# Patient Record
Sex: Male | Born: 1960
Health system: Southern US, Community
[De-identification: ages and names within clinical notes are randomized; demographics above are authoritative.]

## PROBLEM LIST (undated history)

## (undated) DIAGNOSIS — G5730 Lesion of lateral popliteal nerve, unspecified lower limb: Secondary | ICD-10-CM

## (undated) DIAGNOSIS — I5022 Chronic systolic (congestive) heart failure: Secondary | ICD-10-CM

## (undated) DIAGNOSIS — I429 Cardiomyopathy, unspecified: Secondary | ICD-10-CM

## (undated) DIAGNOSIS — I509 Heart failure, unspecified: Secondary | ICD-10-CM

## (undated) DIAGNOSIS — M86669 Other chronic osteomyelitis, unspecified tibia and fibula: Secondary | ICD-10-CM

## (undated) DIAGNOSIS — J45909 Unspecified asthma, uncomplicated: Secondary | ICD-10-CM

## (undated) DIAGNOSIS — IMO0002 Reserved for concepts with insufficient information to code with codable children: Secondary | ICD-10-CM

## (undated) DIAGNOSIS — M19029 Primary osteoarthritis, unspecified elbow: Secondary | ICD-10-CM

## (undated) DIAGNOSIS — M1710 Unilateral primary osteoarthritis, unspecified knee: Secondary | ICD-10-CM

## (undated) DIAGNOSIS — I10 Essential (primary) hypertension: Secondary | ICD-10-CM

## (undated) DIAGNOSIS — S82109A Unspecified fracture of upper end of unspecified tibia, initial encounter for closed fracture: Secondary | ICD-10-CM

## (undated) HISTORY — DX: Reserved for concepts with insufficient information to code with codable children: IMO0002

## (undated) HISTORY — DX: Cardiomyopathy, unspecified: I42.9

## (undated) HISTORY — DX: Chronic systolic (congestive) heart failure: I50.22

## (undated) HISTORY — DX: Lesion of lateral popliteal nerve, unspecified lower limb: G57.30

## (undated) HISTORY — DX: Primary osteoarthritis, unspecified elbow: M19.029

## (undated) HISTORY — PX: LEG SURGERY: SHX1003

## (undated) HISTORY — DX: Unilateral primary osteoarthritis, unspecified knee: M17.10

## (undated) HISTORY — DX: Other chronic osteomyelitis, unspecified tibia and fibula: M86.669

## (undated) HISTORY — DX: Unspecified fracture of upper end of unspecified tibia, initial encounter for closed fracture: S82.109A

## (undated) HISTORY — PX: CARDIAC CATHETERIZATION: SHX172

## (undated) HISTORY — DX: Essential (primary) hypertension: I10

---

## 2002-05-07 HISTORY — PX: OTHER SURGICAL HISTORY: SHX169

## 2005-05-07 DIAGNOSIS — S82109A Unspecified fracture of upper end of unspecified tibia, initial encounter for closed fracture: Secondary | ICD-10-CM

## 2005-05-07 HISTORY — DX: Unspecified fracture of upper end of unspecified tibia, initial encounter for closed fracture: S82.109A

## 2007-05-08 HISTORY — PX: RETINAL DETACHMENT SURGERY: SHX105

## 2010-05-04 ENCOUNTER — Emergency Department (HOSPITAL_COMMUNITY)
Admission: EM | Admit: 2010-05-04 | Discharge: 2010-05-05 | Payer: Self-pay | Source: Home / Self Care | Admitting: Emergency Medicine

## 2010-06-27 ENCOUNTER — Ambulatory Visit: Payer: Self-pay | Admitting: Physical Medicine & Rehabilitation

## 2010-07-11 ENCOUNTER — Ambulatory Visit: Payer: BC Managed Care – PPO | Admitting: Physical Medicine & Rehabilitation

## 2010-08-23 ENCOUNTER — Other Ambulatory Visit: Payer: Self-pay | Admitting: Physical Medicine & Rehabilitation

## 2010-08-23 ENCOUNTER — Encounter
Payer: BC Managed Care – PPO | Attending: Physical Medicine & Rehabilitation | Admitting: Physical Medicine & Rehabilitation

## 2010-08-23 ENCOUNTER — Ambulatory Visit (HOSPITAL_COMMUNITY)
Admission: RE | Admit: 2010-08-23 | Discharge: 2010-08-23 | Disposition: A | Discharge status: Home / Self Care | Payer: BC Managed Care – PPO | Source: Ambulatory Visit | Attending: Physical Medicine & Rehabilitation | Admitting: Physical Medicine & Rehabilitation

## 2010-08-23 DIAGNOSIS — M25469 Effusion, unspecified knee: Secondary | ICD-10-CM | POA: Insufficient documentation

## 2010-08-23 DIAGNOSIS — R52 Pain, unspecified: Secondary | ICD-10-CM

## 2010-08-23 DIAGNOSIS — M79609 Pain in unspecified limb: Secondary | ICD-10-CM | POA: Insufficient documentation

## 2010-08-23 DIAGNOSIS — Z7982 Long term (current) use of aspirin: Secondary | ICD-10-CM | POA: Insufficient documentation

## 2010-08-23 DIAGNOSIS — M25569 Pain in unspecified knee: Secondary | ICD-10-CM | POA: Insufficient documentation

## 2010-08-23 DIAGNOSIS — E669 Obesity, unspecified: Secondary | ICD-10-CM | POA: Insufficient documentation

## 2010-08-23 DIAGNOSIS — I10 Essential (primary) hypertension: Secondary | ICD-10-CM | POA: Insufficient documentation

## 2010-08-23 DIAGNOSIS — M1710 Unilateral primary osteoarthritis, unspecified knee: Secondary | ICD-10-CM

## 2010-08-23 DIAGNOSIS — R209 Unspecified disturbances of skin sensation: Secondary | ICD-10-CM | POA: Insufficient documentation

## 2010-08-23 DIAGNOSIS — Z79899 Other long term (current) drug therapy: Secondary | ICD-10-CM | POA: Insufficient documentation

## 2010-08-23 DIAGNOSIS — M7989 Other specified soft tissue disorders: Secondary | ICD-10-CM | POA: Insufficient documentation

## 2010-08-23 DIAGNOSIS — S82109A Unspecified fracture of upper end of unspecified tibia, initial encounter for closed fracture: Secondary | ICD-10-CM

## 2010-08-23 DIAGNOSIS — Z8781 Personal history of (healed) traumatic fracture: Secondary | ICD-10-CM | POA: Insufficient documentation

## 2010-08-23 DIAGNOSIS — G5730 Lesion of lateral popliteal nerve, unspecified lower limb: Secondary | ICD-10-CM

## 2010-08-23 DIAGNOSIS — F411 Generalized anxiety disorder: Secondary | ICD-10-CM | POA: Insufficient documentation

## 2010-08-23 DIAGNOSIS — M19029 Primary osteoarthritis, unspecified elbow: Secondary | ICD-10-CM

## 2010-08-23 DIAGNOSIS — J45909 Unspecified asthma, uncomplicated: Secondary | ICD-10-CM | POA: Insufficient documentation

## 2010-08-23 DIAGNOSIS — M25579 Pain in unspecified ankle and joints of unspecified foot: Secondary | ICD-10-CM | POA: Insufficient documentation

## 2010-08-23 DIAGNOSIS — M21869 Other specified acquired deformities of unspecified lower leg: Secondary | ICD-10-CM | POA: Insufficient documentation

## 2010-09-20 ENCOUNTER — Ambulatory Visit: Payer: BC Managed Care – PPO | Admitting: Physical Medicine & Rehabilitation

## 2010-09-22 ENCOUNTER — Encounter
Payer: BC Managed Care – PPO | Attending: Physical Medicine & Rehabilitation | Admitting: Physical Medicine & Rehabilitation

## 2010-09-22 DIAGNOSIS — M79609 Pain in unspecified limb: Secondary | ICD-10-CM | POA: Insufficient documentation

## 2010-09-22 DIAGNOSIS — N529 Male erectile dysfunction, unspecified: Secondary | ICD-10-CM | POA: Insufficient documentation

## 2010-09-22 DIAGNOSIS — M19029 Primary osteoarthritis, unspecified elbow: Secondary | ICD-10-CM

## 2010-09-22 DIAGNOSIS — I10 Essential (primary) hypertension: Secondary | ICD-10-CM | POA: Insufficient documentation

## 2010-09-22 DIAGNOSIS — E669 Obesity, unspecified: Secondary | ICD-10-CM | POA: Insufficient documentation

## 2010-09-22 DIAGNOSIS — S82109A Unspecified fracture of upper end of unspecified tibia, initial encounter for closed fracture: Secondary | ICD-10-CM

## 2010-09-22 DIAGNOSIS — F411 Generalized anxiety disorder: Secondary | ICD-10-CM | POA: Insufficient documentation

## 2010-09-22 DIAGNOSIS — M1710 Unilateral primary osteoarthritis, unspecified knee: Secondary | ICD-10-CM

## 2010-09-22 DIAGNOSIS — G5730 Lesion of lateral popliteal nerve, unspecified lower limb: Secondary | ICD-10-CM
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
## 2010-11-17 ENCOUNTER — Ambulatory Visit: Payer: Self-pay | Admitting: Physical Medicine & Rehabilitation

## 2010-11-24 ENCOUNTER — Encounter: Payer: Medicare Other | Attending: Physical Medicine & Rehabilitation | Admitting: Physical Medicine & Rehabilitation

## 2010-11-24 DIAGNOSIS — M79609 Pain in unspecified limb: Secondary | ICD-10-CM | POA: Insufficient documentation

## 2010-11-24 DIAGNOSIS — N529 Male erectile dysfunction, unspecified: Secondary | ICD-10-CM | POA: Insufficient documentation

## 2010-11-24 DIAGNOSIS — R52 Pain, unspecified: Secondary | ICD-10-CM | POA: Insufficient documentation

## 2010-11-24 DIAGNOSIS — S82109A Unspecified fracture of upper end of unspecified tibia, initial encounter for closed fracture: Secondary | ICD-10-CM

## 2010-11-24 DIAGNOSIS — E669 Obesity, unspecified: Secondary | ICD-10-CM | POA: Insufficient documentation

## 2010-11-24 DIAGNOSIS — M19029 Primary osteoarthritis, unspecified elbow: Secondary | ICD-10-CM

## 2010-11-24 DIAGNOSIS — G5730 Lesion of lateral popliteal nerve, unspecified lower limb: Secondary | ICD-10-CM

## 2010-11-24 DIAGNOSIS — M1710 Unilateral primary osteoarthritis, unspecified knee: Secondary | ICD-10-CM

## 2010-11-24 NOTE — Assessment & Plan Note (Signed)
Victor [name] is back regarding his left leg pain.  We started a month of hydrocodone.  Last visit is helping some with his breakthrough pain.  He is wearing the left ankle supramalleolar orthosis that is helping his ankle stability.  He was taking the hydrocodone initially scheduled and ran out early and it was a realizing again that I wanted him to take it as needed.  He has been taking it 2-3 a day now and it seems to be working fairly well for him.  The scheduled dosing was too much from a side effect profile for him regardless.  Pain is about 6-7/10; described as dull, aching, tingling and constant.  Pain interferes with general activities, relations with others and enjoyment of life on a moderate level.  REVIEW OF SYSTEMS:  Notable for numbness, tingling and anxiety.  Full 12- point review is in the written health and history section of the chart.  SOCIAL HISTORY:  The patient is married.  Recently was at the beach.  He is trying to walk about a mile each day for the most part.  PHYSICAL EXAMINATION:  VITAL SIGNS:  Blood pressure is 125/75, pulse 91, respiratory rate 18 and he is satting 96% on room air. GENERAL:  The patient is pleasant and alert. EXTREMITIES:  He has antalgia still on the left leg and walks well slightly wide-based gait.  He has his ankle brace on as well as the leg sleeve.  He has some crepitus in the knee once again.  Weight is unchanged. NEUROLOGIC:  Cognitively, he is alert and appropriate. HEART:  Regular. CHEST:  Clear. ABDOMEN:  Soft, nontender.  ASSESSMENT: 1. History of left lower extremity trauma involving the left tibia and     distal femur.  The patient with multifactorial pain syndrome. 2. Erectile dysfunction. 3. Obesity.  PLAN: 1. Discussed other means of exercise which are less impact including     water walking, swimming, shredding water, stationary bike, etc. 2. The patient has an appointment to see orthopedic surgery for     assessment of  his knee and ankle. 3. We will increase his Topamax to 100 mg nightly to see if this helps     further with dysesthetic pain and perhaps his weight is well.  If     this is unsuccessful after a month, we will stop. 4. I refilled his hydrocodone 5/325 one q.6 h. p.r.n. #90. 5. Continue meloxicam. 6. Refilled Viagra for this patient today as well. 7. I will see him back in about 3 months.     Victor [name], M.D. Electronically Signed    ZTS/MedQ D:  11/24/2010 11:40:35  T:  11/24/2010 12:24:13  Job #:  [id number]

## 2011-02-20 ENCOUNTER — Ambulatory Visit: Payer: Medicare Other | Admitting: Physical Medicine & Rehabilitation

## 2011-03-23 ENCOUNTER — Encounter: Payer: Medicare Other | Admitting: Physical Medicine & Rehabilitation

## 2011-04-18 ENCOUNTER — Encounter: Payer: Medicare Other | Attending: Physical Medicine & Rehabilitation | Admitting: Neurosurgery

## 2011-04-18 DIAGNOSIS — E669 Obesity, unspecified: Secondary | ICD-10-CM | POA: Insufficient documentation

## 2011-04-18 DIAGNOSIS — X58XXXS Exposure to other specified factors, sequela: Secondary | ICD-10-CM | POA: Insufficient documentation

## 2011-04-18 DIAGNOSIS — S8290XS Unspecified fracture of unspecified lower leg, sequela: Secondary | ICD-10-CM | POA: Insufficient documentation

## 2011-04-18 DIAGNOSIS — G8921 Chronic pain due to trauma: Secondary | ICD-10-CM

## 2011-04-18 DIAGNOSIS — N529 Male erectile dysfunction, unspecified: Secondary | ICD-10-CM | POA: Insufficient documentation

## 2011-04-18 DIAGNOSIS — M79609 Pain in unspecified limb: Secondary | ICD-10-CM | POA: Insufficient documentation

## 2011-04-18 DIAGNOSIS — G8928 Other chronic postprocedural pain: Secondary | ICD-10-CM
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
## 2011-04-23 ENCOUNTER — Ambulatory Visit: Payer: Medicare Other | Admitting: Physical Medicine & Rehabilitation

## 2011-04-24 ENCOUNTER — Ambulatory Visit: Payer: Medicare Other | Admitting: Physical Medicine & Rehabilitation

## 2011-05-10 DIAGNOSIS — Z79899 Other long term (current) drug therapy: Secondary | ICD-10-CM | POA: Diagnosis not present

## 2011-05-22 ENCOUNTER — Encounter
Payer: BC Managed Care – PPO | Attending: Physical Medicine & Rehabilitation | Admitting: Physical Medicine & Rehabilitation

## 2011-05-22 DIAGNOSIS — M12569 Traumatic arthropathy, unspecified knee: Secondary | ICD-10-CM | POA: Insufficient documentation

## 2011-05-22 DIAGNOSIS — M79609 Pain in unspecified limb: Secondary | ICD-10-CM | POA: Insufficient documentation

## 2011-05-22 DIAGNOSIS — S82109A Unspecified fracture of upper end of unspecified tibia, initial encounter for closed fracture: Secondary | ICD-10-CM | POA: Diagnosis not present

## 2011-05-22 DIAGNOSIS — E669 Obesity, unspecified: Secondary | ICD-10-CM | POA: Insufficient documentation

## 2011-05-22 DIAGNOSIS — G5730 Lesion of lateral popliteal nerve, unspecified lower limb: Secondary | ICD-10-CM | POA: Diagnosis not present

## 2011-05-22 DIAGNOSIS — M1710 Unilateral primary osteoarthritis, unspecified knee: Secondary | ICD-10-CM | POA: Diagnosis not present

## 2011-05-22 NOTE — Assessment & Plan Note (Signed)
Victor [name] is back regarding his left leg pain.  Apparently, he saw Dr. Modesto [name] at [facility] [place] [facility] who started him on Lyrica and meloxicam.  Still having 8/10 pain which is actually worse from last visit in December.  He attributes this to the cold weather.  He also has been trying to stay a bit active.  However, this is difficult given his pain levels.  REVIEW OF SYSTEMS:  Notable for the above.  He does report some depression.  Full 12-point review is in the written health and history section of the chart.  SOCIAL HISTORY:  Unchanged.  The patient is married.  PHYSICAL EXAMINATION:  VITAL SIGNS:  Blood pressure is 128/99, pulse 92, respiratory rate is 16 and he is satting 97% on room air. GENERAL:  The patient is pleasant and alert. EXTREMITIES:  He walks with antalgia on the left side.  He seems to step down into the left leg as well.  I measured the left leg first to the right.  It measured about a centimeter shorter than the right.  He has varus deformity essentially of the left tibia/leg.  Left knee has mild infrapatellar edema.  There is some crepitus with extension and flexion. Strength is grossly 5/5 at the knee.  Left ankle is limited due to pain and his postsurgical changes.  He does have some diminished sensation grossly over the foot and ankle.  ASSESSMENT: 1. History of left lower extremity trauma involving the left tibial     plateau shaft and distal femur.  He has multifactorial pain as a     result.  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 Obesity.  PLAN: 1. After informed consent, we injected the left knee via lateral     approach with 40 mg Kenalog and 3 mL of 1% lidocaine.  The patient     tolerated it well.  We will observe for results.  I would like him     to pursue non impact exercise for strengthening the leg as well as     for aerobic exercise.  He needs to stay away from running and     prolonged ambulation. 2. He can continue  with Lyrica and meloxicam per Dr. Modesto [name].  We had     used Topamax previously because of the weight loss side effect.  I     discussed the possibility of weight gain with Lyrica. 3. I changed his Norco to oxycodone 10 mg 1 q.8 h. p.r.n. #60. 4. Discussed other options for this patient including Synvisc     injections.  I will see him back next month.     Victor [name], M.D. Electronically Signed    ZTS/MedQ D:  05/22/2011 10:30:10  T:  05/22/2011 18:30:47  Job #:  [id number]

## 2011-06-04 DIAGNOSIS — G894 Chronic pain syndrome: Secondary | ICD-10-CM | POA: Diagnosis not present

## 2011-06-04 DIAGNOSIS — M25569 Pain in unspecified knee: Secondary | ICD-10-CM | POA: Diagnosis not present

## 2011-06-04 DIAGNOSIS — Z79899 Other long term (current) drug therapy: Secondary | ICD-10-CM | POA: Diagnosis not present

## 2011-06-04 DIAGNOSIS — G5790 Unspecified mononeuropathy of unspecified lower limb: Secondary | ICD-10-CM | POA: Diagnosis not present

## 2011-06-04 DIAGNOSIS — G609 Hereditary and idiopathic neuropathy, unspecified: Secondary | ICD-10-CM | POA: Diagnosis not present

## 2011-06-20 ENCOUNTER — Encounter: Payer: Medicare Other | Attending: Physical Medicine & Rehabilitation | Admitting: Physical Medicine & Rehabilitation

## 2011-06-20 DIAGNOSIS — M79609 Pain in unspecified limb: Secondary | ICD-10-CM | POA: Diagnosis not present

## 2011-06-20 DIAGNOSIS — S82109A Unspecified fracture of upper end of unspecified tibia, initial encounter for closed fracture: Secondary | ICD-10-CM | POA: Diagnosis not present

## 2011-06-20 DIAGNOSIS — S72409A Unspecified fracture of lower end of unspecified femur, initial encounter for closed fracture: Secondary | ICD-10-CM | POA: Insufficient documentation

## 2011-06-20 DIAGNOSIS — M19029 Primary osteoarthritis, unspecified elbow: Secondary | ICD-10-CM | POA: Diagnosis not present

## 2011-06-20 DIAGNOSIS — M1710 Unilateral primary osteoarthritis, unspecified knee: Secondary | ICD-10-CM | POA: Diagnosis not present

## 2011-06-20 DIAGNOSIS — G5730 Lesion of lateral popliteal nerve, unspecified lower limb: Secondary | ICD-10-CM

## 2011-06-20 DIAGNOSIS — X58XXXA Exposure to other specified factors, initial encounter: Secondary | ICD-10-CM | POA: Insufficient documentation

## 2011-06-20 DIAGNOSIS — S8410XA Injury of peroneal nerve at lower leg level, unspecified leg, initial encounter: Secondary | ICD-10-CM | POA: Insufficient documentation

## 2011-06-20 DIAGNOSIS — R209 Unspecified disturbances of skin sensation: Secondary | ICD-10-CM | POA: Diagnosis not present

## 2011-06-21 NOTE — Assessment & Plan Note (Signed)
HISTORY OF PRESENT ILLNESS:  Victor [name] is back regarding his left leg pain. The knee injection we gave him last month helped for a week or so.  He is taking Lyrica only once a day and Meloxicam currently.  We changed it over to oxycodone, which has helped.  He is taking 3 or 4 a day at times.  Pain is 8/10.  He is off the Topamax currently.  Sleep is fair.  REVIEW OF SYSTEMS:  Notable for numbness and tingling, still on the left leg and otherwise unchanged.  Full 12-point review is in the written health history section.  SOCIAL HISTORY:  The patient is married, living with his wife.  PHYSICAL EXAMINATION:  VITAL SIGNS:  Blood pressure 119/70, pulse 93, respiratory rate 16, and saturating 95% on room air. NEUROLOGIC:  The patient is pleasant, alert.  He moves all fours.  He has diminished ankle dorsiflexion still on the left due to his peroneal injury.  He has diminished sensation over the peroneal nerve distribution.  He has scarring over the leg and skin which is sensitive and a bit fragile as well.  The left knee itself has some infrapatellar edema.  There is crepitus with extension and flexion.  No gross instability seen today.  ASSESSMENT: 1. Left lower extremity trauma with tibial plateau fracture and distal     femur fracture with multifactorial pain and arthritis at the joint. 2. Peroneal nerve injury.  PLAN: 1. We again injected the left knee with 40 mg Kenalog and 3 mL of 1%     lidocaine.  The patient tolerated it well.  We will set him up for     Synvisc potentially for a month or so for now, series of 3 to help     with the leg.  We discussed in depth weight reducing, knee bracing     as well which may be an option.  He ultimately may need to see a     surgeon for follow up of the knee. 2. We will increase Lyrica ultimately up to t.i.d. for his neuropathic     pain.  He may continue with meloxicam. 3. I refilled Oxycodone 10 mg 1 q.8 h. p.r.n., #90.     Ranelle Oyster, M.D. Electronically Signed    ZTS/MedQ D:  06/20/2011 12:02:25  T:  06/21/2011 07:38:14  Job #:  [id number]

## 2011-07-23 ENCOUNTER — Encounter: Payer: Medicare Other | Attending: Physical Medicine & Rehabilitation | Admitting: Physical Medicine & Rehabilitation

## 2011-07-23 ENCOUNTER — Ambulatory Visit: Payer: Medicare Other | Admitting: Physical Medicine & Rehabilitation

## 2011-07-23 ENCOUNTER — Encounter: Payer: Self-pay | Admitting: Physical Medicine & Rehabilitation

## 2011-07-23 VITALS — BP 135/80 | HR 96 | Resp 16 | Ht 73.0 in | Wt 282.0 lb

## 2011-07-23 DIAGNOSIS — M175 Other unilateral secondary osteoarthritis of knee: Secondary | ICD-10-CM | POA: Diagnosis not present

## 2011-07-23 DIAGNOSIS — S8410XA Injury of peroneal nerve at lower leg level, unspecified leg, initial encounter: Secondary | ICD-10-CM | POA: Diagnosis not present

## 2011-07-23 DIAGNOSIS — M1732 Unilateral post-traumatic osteoarthritis, left knee: Secondary | ICD-10-CM

## 2011-07-23 DIAGNOSIS — S8490XA Injury of unspecified nerve at lower leg level, unspecified leg, initial encounter: Secondary | ICD-10-CM | POA: Insufficient documentation

## 2011-07-23 DIAGNOSIS — X58XXXS Exposure to other specified factors, sequela: Secondary | ICD-10-CM | POA: Insufficient documentation

## 2011-07-23 DIAGNOSIS — G5790 Unspecified mononeuropathy of unspecified lower limb: Secondary | ICD-10-CM | POA: Insufficient documentation

## 2011-07-23 DIAGNOSIS — M722 Plantar fascial fibromatosis: Secondary | ICD-10-CM

## 2011-07-23 MED ORDER — PREGABALIN 100 MG PO CAPS: 100.0000 mg | ORAL_CAPSULE | Freq: Three times a day (TID) | ORAL | Status: DC

## 2011-07-23 MED ORDER — OXYCODONE HCL 15 MG PO TABS: 15.0000 mg | ORAL_TABLET | ORAL | Status: AC | PRN

## 2011-07-23 NOTE — Patient Instructions (Signed)
?   Try a heel cushion. Gentle stretches to feet

## 2011-07-23 NOTE — Progress Notes (Signed)
  Subjective:    Patient ID: Victor [name], male    DOB: Oct 15, 1960, 51 y.o.   MRN: [id number]  HPI [name] is back regarding his chronic left leg pain.   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  It was too much to take on , and he had to resign. Prior to trying work, his pain had been under fair control.  Since trying work, he has had more tingling, burning pain along sole of foot.   Right foot bothers him occaionally as well.  He has heel cushions, but he has not used them.  He just recently increased the lyrica to 50mg  tid. No problems have been reported.   He would ultimately like to try working again but realizes that he will need a more sedentary job.  Pain Inventory Average Pain 8 Pain Right Now 8 My pain is constant, tingling and aching  In the last 24 hours, has pain interfered with the following? General activity 8 Relation with others 8 Enjoyment of life 8 What TIME of day is your pain at its worst? morning and evening Sleep (in general) Fair  Pain is worse with: walking, bending, sitting, standing and some activites Pain improves with: rest, heat/ice and medication Relief from Meds: 5  Mobility walk without assistance how many minutes can you walk? 30 ability to climb steps?  yes do you drive?  yes  Function disabled: date disabled 2007  Neuro/Psych numbness tingling  Prior Studies Any changes since last visit?  no  Physicians involved in your care [facility]       Review of Systems  Cardiovascular: Positive for leg swelling.  Neurological: Positive for numbness.  All other systems reviewed and are negative.       Objective:   Physical Exam  Constitutional: He is oriented to person, place, and time. He appears well-developed.  HENT:  Head: Normocephalic.  Eyes: Pupils are equal, round, and reactive to light.  Neck: Normal range of motion.  Cardiovascular: Normal rate.   Pulmonary/Chest:  Effort normal.  Abdominal: Soft.  Musculoskeletal: Normal range of motion.       Significant crepitus at left knee. Medial and lateral joint line pain. Pain with meniscal maneuvers. No knee instability.    Chronic post-op changes to lower leg.  Mild pain over heel and medial arch left greater than right.  He remains overweight.  Neurological: He is alert and oriented to person, place, and time.  Skin: Skin is warm.          Assessment & Plan:  ASSESSMENT:  1. Left lower extremity trauma with tibial plateau fracture and distal  femur fracture with multifactorial pain and arthritis at the joint.  2. Peroneal nerve injury.  PLAN:  1. Will set up for synvisc injections- a series of 3 to left knee.  2. We will increase lyrica to 100mg   ultimately up to t.i.d. for his neuropathic  pain. There is still a lot of rom to titrate. He may continue with meloxicam.  3. I increased  Oxycodone 15 mg 1 q.8 h. p.r.n., #90.  Need to look at long-acting agent. 4. Consider orthopedic evaluation for surgical intervention at left knee. 5. He will need to explore sedentary work options if he hopes to return to the work force. 6. Recommended trial of a heel cup for his left foot/heeel.

## 2011-07-24 DIAGNOSIS — I10 Essential (primary) hypertension: Secondary | ICD-10-CM | POA: Diagnosis not present

## 2011-07-24 DIAGNOSIS — I428 Other cardiomyopathies: Secondary | ICD-10-CM | POA: Diagnosis not present

## 2011-07-24 DIAGNOSIS — E669 Obesity, unspecified: Secondary | ICD-10-CM | POA: Diagnosis not present

## 2011-08-02 ENCOUNTER — Telehealth: Payer: Self-pay | Admitting: Physical Medicine & Rehabilitation

## 2011-08-02 NOTE — Telephone Encounter (Signed)
Refill on Oxycodone.  New pharmacy [facility] [phone]

## 2011-08-06 NOTE — Telephone Encounter (Signed)
I don't see Oxycodone on pt medication list, it has been crossed out as if it's been d/c'd. Please advise.

## 2011-08-07 MED ORDER — OXYCODONE HCL 15 MG PO TABS: 15.0000 mg | ORAL_TABLET | Freq: Four times a day (QID) | ORAL | Status: DC | PRN

## 2011-08-07 NOTE — Telephone Encounter (Signed)
done

## 2011-08-07 NOTE — Telephone Encounter (Signed)
Pt aware rx is ready for pickup.  

## 2011-08-13 ENCOUNTER — Telehealth: Payer: Self-pay | Admitting: *Deleted

## 2011-08-13 ENCOUNTER — Telehealth: Payer: Self-pay | Admitting: Physical Medicine & Rehabilitation

## 2011-08-13 DIAGNOSIS — M1732 Unilateral post-traumatic osteoarthritis, left knee: Secondary | ICD-10-CM

## 2011-08-13 DIAGNOSIS — S8410XA Injury of peroneal nerve at lower leg level, unspecified leg, initial encounter: Secondary | ICD-10-CM

## 2011-08-13 MED ORDER — OXYCODONE HCL 15 MG PO TABS: 15.0000 mg | ORAL_TABLET | Freq: Four times a day (QID) | ORAL | Status: AC | PRN

## 2011-08-13 MED ORDER — PREGABALIN 100 MG PO CAPS: 100.0000 mg | ORAL_CAPSULE | Freq: Three times a day (TID) | ORAL | Status: DC
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
## 2011-08-13 NOTE — Telephone Encounter (Signed)
Duplicate encounter. This has been sent to Dr. Riley Kill to review already.

## 2011-08-13 NOTE — Telephone Encounter (Signed)
Ok.  He should be able to refill the lyrica directly through his pharmacy.

## 2011-08-13 NOTE — Telephone Encounter (Signed)
Pt aware.
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
## 2011-08-14 ENCOUNTER — Encounter: Payer: Self-pay | Admitting: Physical Medicine & Rehabilitation

## 2011-08-30 ENCOUNTER — Telehealth: Payer: Self-pay | Admitting: Physical Medicine & Rehabilitation

## 2011-08-30 NOTE — Telephone Encounter (Signed)
Pt aware we will refill his medication tomorrow at his appointment.

## 2011-08-30 NOTE — Telephone Encounter (Signed)
Needs refill on Oxycodone

## 2011-08-31 ENCOUNTER — Encounter: Payer: Medicare Other | Attending: Physical Medicine & Rehabilitation | Admitting: Physical Medicine & Rehabilitation

## 2011-08-31 ENCOUNTER — Encounter: Payer: Self-pay | Admitting: Physical Medicine & Rehabilitation

## 2011-08-31 VITALS — BP 125/66 | HR 69 | Resp 16 | Ht 73.0 in | Wt 281.8 lb

## 2011-08-31 DIAGNOSIS — M1732 Unilateral post-traumatic osteoarthritis, left knee: Secondary | ICD-10-CM

## 2011-08-31 DIAGNOSIS — S8410XA Injury of peroneal nerve at lower leg level, unspecified leg, initial encounter: Secondary | ICD-10-CM | POA: Diagnosis not present

## 2011-08-31 DIAGNOSIS — M175 Other unilateral secondary osteoarthritis of knee: Secondary | ICD-10-CM | POA: Insufficient documentation

## 2011-08-31 DIAGNOSIS — M722 Plantar fascial fibromatosis: Secondary | ICD-10-CM

## 2011-08-31 DIAGNOSIS — X58XXXS Exposure to other specified factors, sequela: Secondary | ICD-10-CM | POA: Insufficient documentation

## 2011-08-31 MED ORDER — OXYCODONE HCL 15 MG PO TABS: 15.0000 mg | ORAL_TABLET | Freq: Four times a day (QID) | ORAL | Status: DC | PRN

## 2011-08-31 MED ORDER — PREGABALIN 150 MG PO CAPS: 150.0000 mg | ORAL_CAPSULE | Freq: Three times a day (TID) | ORAL | Status: DC

## 2011-08-31 NOTE — Patient Instructions (Signed)
Contact orthotist about a leg sleeve

## 2011-08-31 NOTE — Progress Notes (Signed)
Subjective:    Patient ID: Victor [name], male    DOB: 1960-08-16, 51 y.o.   MRN: [id number]  HPI  Victor [name] is back regarding his chronic leg pain.  The lyrica increase was helpful. He's often using 200mg  at a time. He's having less pins and needle sensation. He's tolerating the medicine well.  We had planned initially on having synvisc injections today, but he's doing well enough now that he's having thoughts that he might want to hold off.    His left knee still swells at times. He wears his leg sleeve when up during the day.     Pain Inventory Average Pain 8 Pain Right Now 8 My pain is dull, tingling and aching  In the last 24 hours, has pain interfered with the following? General activity 8 Relation with others 8 Enjoyment of life 8 What TIME of day is your pain at its worst? morning and evening Sleep (in general) Fair  Pain is worse with: walking, bending, sitting, standing and some activites Pain improves with: rest, heat/ice and medication Relief from Meds: 8  Mobility walk without assistance how many minutes can you walk? 30 ability to climb steps?  yes do you drive?  yes Do you have any goals in this area?  yes  Function disabled: date disabled 2007  Neuro/Psych numbness tingling trouble walking  Prior Studies Any changes since last visit?  no  Physicians involved in your care Any changes since last visit?  no      Review of Systems  Musculoskeletal: Positive for gait problem.       Leg pain  Neurological: Positive for numbness.  All other systems reviewed and are negative.       Objective:   Physical Exam  Constitutional: He is oriented to person, place, and time. He appears well-developed and well-nourished.  HENT:  Head: Normocephalic and atraumatic.  Eyes: Conjunctivae and EOM are normal. Pupils are equal, round, and reactive to light.  Neck: Normal range of motion.  Cardiovascular: Normal rate and regular rhythm.   Pulmonary/Chest:  Effort normal and breath sounds normal.  Abdominal: Soft. Bowel sounds are normal.  Musculoskeletal: Normal range of motion.       Mild to moderate swelling around the left knee.  Crepitus is persistent there. Deformities persistent aroound the left leg.  Neurological: He is alert and oriented to person, place, and time.       Chronic changes in left leg. Persistent weakness and limtied rom in the left ankle.  Skin: Skin is warm.  Psychiatric: He has a normal mood and affect. His behavior is normal. Judgment and thought content normal.          Assessment & Plan:  ASSESSMENT:  1. Left lower extremity trauma with tibial plateau fracture and distal  femur fracture with multifactorial pain and arthritis at the joint.  2. Peroneal nerve injury.  PLAN:  1. Will hold off for now on synvisc as he's functioning better.  2. We will increase lyrica to 150mg   t.i.d. for his neuropathic  pain. This has helped quite a bit. He may continue with meloxicam.  3.Continue Oxycodone 15 mg 1 q.8 h. p.r.n., #90. Still may need to look at long acting agent  4. Made orthotic referral for left custom compression stocking or leg sleeve 5. He will need to explore sedentary work options if he hopes to return to the work force.  6. F/u in 2 months. Can pick up oxy next month prior to  it being due.

## 2011-09-07 ENCOUNTER — Ambulatory Visit: Payer: BC Managed Care – PPO | Admitting: Physical Medicine & Rehabilitation

## 2011-09-17 ENCOUNTER — Ambulatory Visit: Payer: BC Managed Care – PPO | Admitting: Physical Medicine & Rehabilitation

## 2011-09-20 ENCOUNTER — Telehealth: Payer: Self-pay | Admitting: *Deleted

## 2011-09-20 NOTE — Telephone Encounter (Signed)
Requesting refill on his Oxycodone hcl 15 mg.  Will be out on Saturday

## 2011-09-20 NOTE — Telephone Encounter (Signed)
Pt says he will be out this Saturday.  Records show last script was given 4/26 please advise.

## 2011-09-21 ENCOUNTER — Other Ambulatory Visit: Payer: Self-pay | Admitting: *Deleted

## 2011-09-21 ENCOUNTER — Telehealth: Payer: Self-pay

## 2011-09-21 DIAGNOSIS — M1732 Unilateral post-traumatic osteoarthritis, left knee: Secondary | ICD-10-CM

## 2011-09-21 DIAGNOSIS — S8410XA Injury of peroneal nerve at lower leg level, unspecified leg, initial encounter: Secondary | ICD-10-CM

## 2011-09-21 DIAGNOSIS — M722 Plantar fascial fibromatosis: Secondary | ICD-10-CM

## 2011-09-21 MED ORDER — OXYCODONE HCL 15 MG PO TABS: 15.0000 mg | ORAL_TABLET | Freq: Four times a day (QID) | ORAL | Status: DC | PRN

## 2011-09-21 NOTE — Telephone Encounter (Signed)
Pt called requesting refill on Oxycodone 15 mg.

## 2011-09-21 NOTE — Telephone Encounter (Signed)
Pt is not due for a refill until 09/28/11. He is upset that #90 is not going to last him 30 days. Dr. Riley Kill is going to speak to him about this.

## 2011-10-17 ENCOUNTER — Telehealth: Payer: Self-pay | Admitting: *Deleted

## 2011-10-17 DIAGNOSIS — S8410XA Injury of peroneal nerve at lower leg level, unspecified leg, initial encounter: Secondary | ICD-10-CM

## 2011-10-17 DIAGNOSIS — M722 Plantar fascial fibromatosis: Secondary | ICD-10-CM

## 2011-10-17 DIAGNOSIS — M1732 Unilateral post-traumatic osteoarthritis, left knee: Secondary | ICD-10-CM

## 2011-10-17 MED ORDER — OXYCODONE HCL 15 MG PO TABS: 15.0000 mg | ORAL_TABLET | Freq: Four times a day (QID) | ORAL | Status: DC | PRN

## 2011-10-17 NOTE — Telephone Encounter (Signed)
"  As instructed, I am calling in early for my oxycodone refill".  Last fill date 09/21/11 #90 Roxicodone 15mg , 1 q 6 hr prn. (rx printed with do not fill before 10/20/11)

## 2011-10-18 ENCOUNTER — Telehealth: Payer: Self-pay

## 2011-10-18 NOTE — Telephone Encounter (Signed)
Ok to refill 

## 2011-10-18 NOTE — Telephone Encounter (Signed)
Pt called to get a refill on his oxy 15 mg.

## 2011-10-18 NOTE — Telephone Encounter (Signed)
Pt aware script is ready for pick up 

## 2011-10-31 ENCOUNTER — Encounter: Payer: Self-pay | Admitting: Physical Medicine & Rehabilitation

## 2011-10-31 ENCOUNTER — Encounter: Payer: Medicare Other | Attending: Physical Medicine & Rehabilitation | Admitting: Physical Medicine & Rehabilitation

## 2011-10-31 VITALS — BP 139/84 | HR 87 | Resp 16 | Ht 73.0 in | Wt 287.0 lb

## 2011-10-31 DIAGNOSIS — M175 Other unilateral secondary osteoarthritis of knee: Secondary | ICD-10-CM | POA: Diagnosis not present

## 2011-10-31 DIAGNOSIS — S8410XA Injury of peroneal nerve at lower leg level, unspecified leg, initial encounter: Secondary | ICD-10-CM | POA: Diagnosis not present

## 2011-10-31 DIAGNOSIS — M722 Plantar fascial fibromatosis: Secondary | ICD-10-CM | POA: Insufficient documentation

## 2011-10-31 DIAGNOSIS — X58XXXS Exposure to other specified factors, sequela: Secondary | ICD-10-CM | POA: Insufficient documentation

## 2011-10-31 DIAGNOSIS — M25579 Pain in unspecified ankle and joints of unspecified foot: Secondary | ICD-10-CM | POA: Diagnosis not present

## 2011-10-31 DIAGNOSIS — M1732 Unilateral post-traumatic osteoarthritis, left knee: Secondary | ICD-10-CM

## 2011-10-31 DIAGNOSIS — M25569 Pain in unspecified knee: Secondary | ICD-10-CM | POA: Insufficient documentation

## 2011-10-31 MED ORDER — MELOXICAM 15 MG PO TABS: 15.0000 mg | ORAL_TABLET | Freq: Every day | ORAL | Status: DC | PRN

## 2011-10-31 MED ORDER — OXYCODONE HCL 15 MG PO TABS: 15.0000 mg | ORAL_TABLET | Freq: Four times a day (QID) | ORAL | Status: DC | PRN

## 2011-10-31 NOTE — Progress Notes (Signed)
Subjective:    Patient ID: Victor [name], male    DOB: November 22, 1960, 51 y.o.   MRN: [id number]  HPI  Victor [name] is back regarding his chronic left knee and leg pain. His knee pain is doing better overall. He feels that the lyrica has helped it a lot. His ankle gives him the most problems currently. The ankle and lower leg will swell, especially with increased activity or heat. He's using 2 or 3 oxy's aday currently. He ran out of the mobic.  He's trying to stay somewhat active at home.  He cut part of his grass yesterday, but could only get through the front yard before he had to stop.  He's going to travel to [facility] next week for vacation. Pain Inventory Average Pain 8 Pain Right Now 8 My pain is dull, tingling and aching  In the last 24 hours, has pain interfered with the following? General activity 8 Relation with others 8 Enjoyment of life 8 What TIME of day is your pain at its worst? Morning and Evening Sleep (in general) Fair  Pain is worse with: walking, sitting and standing Pain improves with: rest and medication Relief from Meds: 7  Mobility walk without assistance how many minutes can you walk? 45 ability to climb steps?  yes do you drive?  yes  Function disabled: date disabled   Neuro/Psych numbness tingling trouble walking  Prior Studies Any changes since last visit?  no  Physicians involved in your care Any changes since last visit?  no   Family History  Problem Relation Age of Onset  . Kidney disease Father    History   Social History  . Marital Status: Married    Spouse Name: N/A    Number of Children: N/A  . Years of Education: N/A   Social History Main Topics  . Smoking status: Never Smoker   . Smokeless tobacco: Never Used  . Alcohol Use: Yes  . Drug Use: None  . Sexually Active: None   Other Topics Concern  . None   Social History Narrative  . None   History reviewed. No pertinent past surgical history. Past Medical History    Diagnosis Date  . Closed fracture of tibia, upper end   . Lesion of lateral popliteal nerve   . Primary localized osteoarthrosis, lower leg   . Primary localized osteoarthrosis, upper arm   . Hypertension    BP 139/84  Pulse 87  Resp 16  Ht 6\' 1"  (1.854 m)  Wt 287 lb (130.182 kg)  BMI 37.86 kg/m2  SpO2 94%      Review of Systems  Constitutional: Negative.   HENT: Negative.   Eyes: Negative.   Respiratory: Negative.   Cardiovascular: Negative.   Gastrointestinal: Negative.   Genitourinary: Negative.   Musculoskeletal: Positive for gait problem.  Skin: Negative.   Neurological: Positive for numbness.  Hematological: Negative.   Psychiatric/Behavioral: Negative.        Objective:   Physical Exam  Constitutional: He is oriented to person, place, and time. He appears well-developed and well-nourished.  HENT:  Head: Normocephalic and atraumatic.  Eyes: Conjunctivae and EOM are normal. Pupils are equal, round, and reactive to light.  Neck: Normal range of motion.  Cardiovascular: Normal rate and regular rhythm.  Pulmonary/Chest: Effort normal and breath sounds normal.  Abdominal: Soft. Bowel sounds are normal.  Musculoskeletal: Normal range of motion.  Mild to moderate swelling around the left knee. Crepitus is persistent there. Deformities persistent aroound the left leg.  Neurological: He is alert and oriented to person, place, and time.  Chronic changes in left leg. Persistent weakness and limtied rom in the left ankle.  Skin: Skin is warm.  Psychiatric: He has a normal mood and affect. His behavior is normal. Judgment and thought content normal.  Assessment & Plan:   ASSESSMENT:  1. Left lower extremity trauma with tibial plateau fracture and distal  femur fracture with multifactorial pain and arthritis at the joint.  2. Left Peroneal nerve injury.  PLAN:  1. Discussed management of edema, use of ice etc. He will have long term difficulties with swelling based  on his activity levels, weight, etc. I'm sure the lyrica hasn't helped his swelling either.  2. Continue lyrica 150mg  t.i.d. for his neuropathic  pain. This has helped quite a bit. He may continue with meloxicam and use this prn.  3.Continue Oxycodone 15 mg 1 q.8 h. p.r.n., #90. He's generally using 2 or 3 a day at this point. 4.  left custom compression stocking/ leg sleeve is helpful. 5. He will need to explore sedentary work options if he hopes to return to the work force.  6. F/u in about 6 weeks with our PA

## 2011-10-31 NOTE — Patient Instructions (Signed)
Use your pain oxycodone and mobic for flare ups or when you will be more active on a given day.

## 2011-11-29 ENCOUNTER — Other Ambulatory Visit: Payer: Self-pay

## 2011-11-29 ENCOUNTER — Telehealth: Payer: Self-pay

## 2011-11-29 MED ORDER — SILDENAFIL CITRATE 50 MG PO TABS: 50.0000 mg | ORAL_TABLET | Freq: Every day | ORAL | Status: DC | PRN

## 2011-11-29 NOTE — Telephone Encounter (Signed)
Viagra sent to pharmacy.  Pt aware.

## 2011-11-29 NOTE — Telephone Encounter (Signed)
Pt called requesting refill on Viagra 50mg  #22 called to [phone].  Please advise.

## 2011-12-18 ENCOUNTER — Encounter
Payer: Medicare Other | Attending: Physical Medicine and Rehabilitation | Admitting: Physical Medicine and Rehabilitation

## 2011-12-18 ENCOUNTER — Encounter: Payer: Self-pay | Admitting: Physical Medicine and Rehabilitation

## 2011-12-18 ENCOUNTER — Other Ambulatory Visit: Payer: Self-pay | Admitting: *Deleted

## 2011-12-18 VITALS — BP 132/78 | HR 82 | Resp 14 | Ht 73.0 in | Wt 282.2 lb

## 2011-12-18 DIAGNOSIS — M25562 Pain in left knee: Secondary | ICD-10-CM

## 2011-12-18 DIAGNOSIS — M25569 Pain in unspecified knee: Secondary | ICD-10-CM | POA: Diagnosis not present

## 2011-12-18 DIAGNOSIS — M159 Polyosteoarthritis, unspecified: Secondary | ICD-10-CM | POA: Diagnosis not present

## 2011-12-18 DIAGNOSIS — M79609 Pain in unspecified limb: Secondary | ICD-10-CM | POA: Insufficient documentation

## 2011-12-18 DIAGNOSIS — M722 Plantar fascial fibromatosis: Secondary | ICD-10-CM

## 2011-12-18 DIAGNOSIS — M175 Other unilateral secondary osteoarthritis of knee: Secondary | ICD-10-CM | POA: Diagnosis not present

## 2011-12-18 DIAGNOSIS — M1732 Unilateral post-traumatic osteoarthritis, left knee: Secondary | ICD-10-CM

## 2011-12-18 DIAGNOSIS — M25579 Pain in unspecified ankle and joints of unspecified foot: Secondary | ICD-10-CM

## 2011-12-18 DIAGNOSIS — I10 Essential (primary) hypertension: Secondary | ICD-10-CM | POA: Diagnosis not present

## 2011-12-18 DIAGNOSIS — S346XXS Injury of peripheral nerve(s) at abdomen, lower back and pelvis level, sequela: Secondary | ICD-10-CM | POA: Insufficient documentation

## 2011-12-18 DIAGNOSIS — X58XXXS Exposure to other specified factors, sequela: Secondary | ICD-10-CM | POA: Insufficient documentation

## 2011-12-18 DIAGNOSIS — S8410XA Injury of peroneal nerve at lower leg level, unspecified leg, initial encounter: Secondary | ICD-10-CM

## 2011-12-18 DIAGNOSIS — G8921 Chronic pain due to trauma: Secondary | ICD-10-CM | POA: Diagnosis not present

## 2011-12-18 DIAGNOSIS — S8490XS Injury of unspecified nerve at lower leg level, unspecified leg, sequela: Secondary | ICD-10-CM | POA: Insufficient documentation

## 2011-12-18 MED ORDER — OXYCODONE HCL 15 MG PO TABS: 15.0000 mg | ORAL_TABLET | Freq: Four times a day (QID) | ORAL | Status: DC | PRN

## 2011-12-18 NOTE — Patient Instructions (Addendum)
Stay as active as symptoms allow, continue with your walking program. Try to look into joining a gym with a pool, also you can find out whether your insurance supports the [employer] program.

## 2011-12-18 NOTE — Progress Notes (Signed)
Subjective:    Patient ID: Victor [name], male    DOB: Dec 28, 1960, 51 y.o.   MRN: [id number]  HPI Victor [name] is back regarding his chronic left knee and leg pain. His knee pain is doing better overall. He feels that the lyrica has helped it a lot. He reports that he has been on the beach, where he swam and walked in the pool, which he really enjoyed. He's using 2 or 3 oxy's aday currently.   He's trying to stay somewhat active at home.   Pain Inventory Average Pain 8 Pain Right Now 8 My pain is constant, dull, tingling and aching  In the last 24 hours, has pain interfered with the following? General activity 8 Relation with others 7 Enjoyment of life 8 What TIME of day is your pain at its worst? morning and night Sleep (in general) Fair  Pain is worse with: walking, bending, standing and some activites Pain improves with: rest and medication Relief from Meds: 8  Mobility walk without assistance how many minutes can you walk? 30  Function disabled: date disabled 07/11/05  Neuro/Psych No problems in this area  Prior Studies Any changes since last visit?  no  Physicians involved in your care Any changes since last visit?  no   Family History  Problem Relation Age of Onset  . Kidney disease Father    History   Social History  . Marital Status: Married    Spouse Name: N/A    Number of Children: N/A  . Years of Education: N/A   Social History Main Topics  . Smoking status: Never Smoker   . Smokeless tobacco: Never Used  . Alcohol Use: Yes  . Drug Use: None  . Sexually Active: None   Other Topics Concern  . None   Social History Narrative  . None   History reviewed. No pertinent past surgical history. Past Medical History  Diagnosis Date  . Closed fracture of tibia, upper end   . Lesion of lateral popliteal nerve   . Primary localized osteoarthrosis, lower leg   . Primary localized osteoarthrosis, upper arm   . Hypertension    BP 132/78  Pulse 82   Resp 14  Ht 6\' 1"  (1.854 m)  Wt 282 lb 3.2 oz (128.005 kg)  BMI 37.23 kg/m2  SpO2 96%    Review of Systems  Musculoskeletal:       Knee and leg pain in left leg  All other systems reviewed and are negative.       Objective:   Physical Exam  Constitutional: He is oriented to person, place, and time. He appears well-developed and well-nourished.  HENT:  Head: Normocephalic and atraumatic.  Eyes: Conjunctivae and EOM are normal. Pupils are equal, round, and reactive to light.  Neck: Normal range of motion.  Cardiovascular: Normal rate and regular rhythm.  Pulmonary/Chest: Effort normal and breath sounds normal.  Abdominal: Soft. Bowel sounds are normal.  Musculoskeletal: left knee full extension, flexion 90-100 degrees,  Left ankle extension 0/90 degrees, plantar flex 20 degrees. Tibialis anterior 3_-/5, ext. Hall. Long. 0/5 Mild to moderate swelling around the left knee. Crepitus is persistent there. Deformities persistent aroound the left leg.  Neurological: He is alert and oriented to person, place, and time.  Chronic changes in left leg. Persistent weakness and limtied rom in the left ankle.  Skin: Skin is warm.  Psychiatric: He has a normal mood and affect. His behavior is normal. Judgment and thought content normal.  Assessment & Plan:  1. Left lower extremity trauma with tibial plateau fracture and distal  femur fracture with multifactorial pain and arthritis at the joint.  2. Left Peroneal nerve injury.  PLAN:  1. Discussed management of edema, use of ice etc. Walking exercising in the pool. He will have long term difficulties with swelling based on his activity levels, weight, etc. I'm sure the lyrica hasn't helped his swelling either.  2. Continue lyrica 150mg  t.i.d. for his neuropathic  pain. This has helped quite a bit. He may continue with meloxicam and use this prn.  3.Continue Oxycodone 15 mg 1 q.8 h. p.r.n., #90. He's generally using 2 or 3 a day at  this point.  4. left custom compression stocking/ leg sleeve is helpful.  5. He will need to explore sedentary work options if he hopes to return to the work force.  6. Advised patient to join a gym with a pool, which would be very beneficial to build up his strength, losing weight, and it would also help with the edema; also recommended to look into the silver sneakers program.  F/u in about 6 weeks with our PA

## 2012-01-02 ENCOUNTER — Telehealth: Payer: Self-pay | Admitting: *Deleted

## 2012-01-02 MED ORDER — SILDENAFIL CITRATE 50 MG PO TABS: 50.0000 mg | ORAL_TABLET | Freq: Every day | ORAL | Status: DC | PRN

## 2012-01-02 NOTE — Telephone Encounter (Signed)
Refill on Viagra. #22  Rx has been sent in, pt aware.

## 2012-01-14 DIAGNOSIS — H409 Unspecified glaucoma: Secondary | ICD-10-CM | POA: Diagnosis not present

## 2012-01-14 DIAGNOSIS — H4011X0 Primary open-angle glaucoma, stage unspecified: Secondary | ICD-10-CM | POA: Diagnosis not present

## 2012-01-14 DIAGNOSIS — H31009 Unspecified chorioretinal scars, unspecified eye: Secondary | ICD-10-CM | POA: Diagnosis not present

## 2012-01-14 DIAGNOSIS — H2510 Age-related nuclear cataract, unspecified eye: Secondary | ICD-10-CM | POA: Diagnosis not present

## 2012-01-15 ENCOUNTER — Telehealth: Payer: Self-pay | Admitting: *Deleted

## 2012-01-15 DIAGNOSIS — M25569 Pain in unspecified knee: Secondary | ICD-10-CM

## 2012-01-15 DIAGNOSIS — S8410XA Injury of peroneal nerve at lower leg level, unspecified leg, initial encounter: Secondary | ICD-10-CM

## 2012-01-15 DIAGNOSIS — M1732 Unilateral post-traumatic osteoarthritis, left knee: Secondary | ICD-10-CM

## 2012-01-15 DIAGNOSIS — M722 Plantar fascial fibromatosis: Secondary | ICD-10-CM

## 2012-01-15 DIAGNOSIS — M25579 Pain in unspecified ankle and joints of unspecified foot: Secondary | ICD-10-CM

## 2012-01-15 MED ORDER — OXYCODONE HCL 15 MG PO TABS: 15.0000 mg | ORAL_TABLET | Freq: Four times a day (QID) | ORAL | Status: DC | PRN

## 2012-01-15 NOTE — Telephone Encounter (Signed)
Refill Oxycodone 

## 2012-01-15 NOTE — Telephone Encounter (Signed)
Printed for Karen to sign 

## 2012-01-15 NOTE — Telephone Encounter (Signed)
Rx is ready to be picked up, pt aware. 

## 2012-01-29 ENCOUNTER — Encounter: Payer: Medicare Other | Admitting: Physical Medicine and Rehabilitation

## 2012-02-11 DIAGNOSIS — H2510 Age-related nuclear cataract, unspecified eye: Secondary | ICD-10-CM | POA: Diagnosis not present

## 2012-02-11 DIAGNOSIS — H21559 Recession of chamber angle, unspecified eye: Secondary | ICD-10-CM | POA: Diagnosis not present

## 2012-02-13 ENCOUNTER — Encounter
Payer: Medicare Other | Attending: Physical Medicine and Rehabilitation | Admitting: Physical Medicine and Rehabilitation

## 2012-02-13 ENCOUNTER — Encounter: Payer: Self-pay | Admitting: Physical Medicine and Rehabilitation

## 2012-02-13 VITALS — BP 154/83 | HR 89 | Ht 73.0 in | Wt 283.8 lb

## 2012-02-13 DIAGNOSIS — M1732 Unilateral post-traumatic osteoarthritis, left knee: Secondary | ICD-10-CM

## 2012-02-13 DIAGNOSIS — M25579 Pain in unspecified ankle and joints of unspecified foot: Secondary | ICD-10-CM

## 2012-02-13 DIAGNOSIS — M1710 Unilateral primary osteoarthritis, unspecified knee: Secondary | ICD-10-CM | POA: Insufficient documentation

## 2012-02-13 DIAGNOSIS — X58XXXA Exposure to other specified factors, initial encounter: Secondary | ICD-10-CM | POA: Insufficient documentation

## 2012-02-13 DIAGNOSIS — G8929 Other chronic pain: Secondary | ICD-10-CM | POA: Insufficient documentation

## 2012-02-13 DIAGNOSIS — M175 Other unilateral secondary osteoarthritis of knee: Secondary | ICD-10-CM | POA: Diagnosis not present

## 2012-02-13 DIAGNOSIS — S82109A Unspecified fracture of upper end of unspecified tibia, initial encounter for closed fracture: Secondary | ICD-10-CM | POA: Diagnosis not present

## 2012-02-13 DIAGNOSIS — S8410XA Injury of peroneal nerve at lower leg level, unspecified leg, initial encounter: Secondary | ICD-10-CM | POA: Diagnosis not present

## 2012-02-13 DIAGNOSIS — M25569 Pain in unspecified knee: Secondary | ICD-10-CM | POA: Diagnosis not present

## 2012-02-13 DIAGNOSIS — M722 Plantar fascial fibromatosis: Secondary | ICD-10-CM

## 2012-02-13 DIAGNOSIS — M79609 Pain in unspecified limb: Secondary | ICD-10-CM | POA: Insufficient documentation

## 2012-02-13 DIAGNOSIS — S72409A Unspecified fracture of lower end of unspecified femur, initial encounter for closed fracture: Secondary | ICD-10-CM | POA: Insufficient documentation

## 2012-02-13 MED ORDER — MELOXICAM 15 MG PO TABS: 15.0000 mg | ORAL_TABLET | Freq: Every day | ORAL | Status: DC | PRN

## 2012-02-13 MED ORDER — PREGABALIN 150 MG PO CAPS: 150.0000 mg | ORAL_CAPSULE | Freq: Three times a day (TID) | ORAL | Status: DC

## 2012-02-13 MED ORDER — OXYCODONE HCL 15 MG PO TABS: 15.0000 mg | ORAL_TABLET | Freq: Four times a day (QID) | ORAL | Status: DC | PRN

## 2012-02-13 NOTE — Progress Notes (Signed)
Subjective:    Patient ID: Victor [name], male    DOB: 1960/10/25, 51 y.o.   MRN: [id number]  HPI Victor [name] is back regarding his chronic left knee and leg pain. His knee pain is doing better overall, but his lower leg pain is still aggrevating. He feels that the lyrica has helped . He reports that he is only taking the Lyrica bid , it is prescribed as tid.  He's using   3 oxy's aday currently, he states, that the medication is not controlling his Sx sufficiently at this point, he would like to get a higher dose of his Oxycodone.  He's trying to stay somewhat active at home, he has not looked into the Silver sne[employer] program yet..   Pain Inventory Average Pain 8 Pain Right Now 9 My pain is constant, sharp and aching  In the last 24 hours, has pain interfered with the following? General activity 8 Relation with others 8 Enjoyment of life 9 What TIME of day is your pain at its worst? morning and night Sleep (in general) Poor  Pain is worse with: walking, bending, standing and some activites Pain improves with: rest, therapy/exercise and medication Relief from Meds: 7  Mobility walk without assistance how many minutes can you walk? 30 ability to climb steps?  yes do you drive?  yes  Function disabled: date disabled  retired  Neuro/Psych numbness depression  Prior Studies Any changes since last visit?  no  Physicians involved in your care Any changes since last visit?  no   Family History  Problem Relation Age of Onset  . Kidney disease Father    History   Social History  . Marital Status: Married    Spouse Name: N/A    Number of Children: N/A  . Years of Education: N/A   Social History Main Topics  . Smoking status: Never Smoker   . Smokeless tobacco: Never Used  . Alcohol Use: Yes  . Drug Use: None  . Sexually Active: None   Other Topics Concern  . None   Social History Narrative  . None   History reviewed. No pertinent past surgical history. Past  Medical History  Diagnosis Date  . Closed fracture of tibia, upper end   . Lesion of lateral popliteal nerve   . Primary localized osteoarthrosis, lower leg   . Primary localized osteoarthrosis, upper arm   . Hypertension    BP 154/83  Pulse 89  Ht 6\' 1"  (1.854 m)  Wt 283 lb 12.8 oz (128.731 kg)  BMI 37.44 kg/m2  SpO2 93%    Review of Systems  Respiratory: Positive for cough and wheezing.   Musculoskeletal:       Left leg pain  Neurological: Positive for numbness.  Psychiatric/Behavioral: Positive for dysphoric mood.  All other systems reviewed and are negative.       Objective:   Physical Exam Constitutional: He is oriented to person, place, and time. He appears well-developed and well-nourished.  HENT:  Head: Normocephalic and atraumatic.  Eyes: Conjunctivae and EOM are normal. Pupils are equal, round, and reactive to light.  Neck: Normal range of motion.  Cardiovascular: Normal rate and regular rhythm.  Pulmonary/Chest: Effort normal and breath sounds normal.  Abdominal: Soft. Bowel sounds are normal.  Musculoskeletal: left knee full extension, flexion 90-100 degrees, Left ankle extension 0/90 degrees, plantar flex 20 degrees. Tibialis anterior 3_-/5, ext. Hall. Long. 0/5 Mild to moderate swelling around the left knee. Crepitus is persistent there. Deformities persistent aroound the  left leg.  Neurological: He is alert and oriented to person, place, and time.  Chronic changes in left leg. Persistent weakness and limtied rom in the left ankle.  Skin: Skin is warm.  Psychiatric: He has a normal mood and affect. His behavior is normal. Judgment and thought content normal.         Assessment & Plan:  1. Left lower extremity trauma with tibial plateau fracture and distal  femur fracture with multifactorial pain and arthritis at the joint.  2. Left Peroneal nerve injury.  PLAN:  1. Discussed management of edema, use of ice etc. Walking exercising in the pool. He  will have long term difficulties with swelling based on his activity levels, weight, etc. I'm sure the lyrica hasn't helped his swelling either.  2. Continue lyrica 150mg  b.i.d. for his neuropathic  pain. This has helped quite a bit. He may continue with meloxicam and use this prn.  3.Continue Oxycodone 15 mg 1 q.8 h. p.r.n., #90. He's generally using  3 a day at this point. Explained to patient , that all his medication works together, and that he should take his meloxicam and Lyrica as prescribed, befor we would think about increasing his Oxycodone, I also explained that most of his pain is nerve pain, and therefore he should take his Lyrica as prescribed, and for his ankle joint pain he should also take his Mobic, which he has not done in the last month. 4. left custom compression stocking/ leg sleeve is helpful.  5. He will need to explore sedentary work options if he hopes to return to the work force.  6. Advised patient to join a gym with a pool, which would be very beneficial to build up his strength, losing weight, and it would also help with the edema; also recommended to look into the silver sneakers program.  F/u in about 6 weeks with our PA

## 2012-02-13 NOTE — Patient Instructions (Signed)
Try to look into the [employer] program, whether your insurance support this program and pay for a membership in a gym.

## 2012-02-14 ENCOUNTER — Ambulatory Visit: Payer: Medicare Other | Admitting: Physical Medicine and Rehabilitation

## 2012-02-26 ENCOUNTER — Telehealth: Payer: Self-pay | Admitting: Physical Medicine & Rehabilitation

## 2012-02-26 MED ORDER — SILDENAFIL CITRATE 50 MG PO TABS: 50.0000 mg | ORAL_TABLET | Freq: Every day | ORAL | Status: DC | PRN

## 2012-02-26 NOTE — Telephone Encounter (Signed)
Refill Viagra.

## 2012-02-26 NOTE — Telephone Encounter (Signed)
done

## 2012-03-11 ENCOUNTER — Telehealth: Payer: Self-pay | Admitting: Physical Medicine & Rehabilitation

## 2012-03-11 NOTE — Telephone Encounter (Signed)
Lm advising patient to make appt to get medication refilled so then he is back on track with his refills.

## 2012-03-11 NOTE — Telephone Encounter (Signed)
Refill on oxycodone

## 2012-03-11 NOTE — Telephone Encounter (Signed)
Pt scheduled to come in earlier for medication.

## 2012-03-12 ENCOUNTER — Encounter
Payer: Medicare Other | Attending: Physical Medicine and Rehabilitation | Admitting: Physical Medicine and Rehabilitation

## 2012-03-12 ENCOUNTER — Encounter: Payer: Self-pay | Admitting: Physical Medicine and Rehabilitation

## 2012-03-12 VITALS — BP 120/71 | HR 84 | Resp 16 | Ht 73.0 in | Wt 290.0 lb

## 2012-03-12 DIAGNOSIS — S8410XA Injury of peroneal nerve at lower leg level, unspecified leg, initial encounter: Secondary | ICD-10-CM | POA: Diagnosis not present

## 2012-03-12 DIAGNOSIS — E669 Obesity, unspecified: Secondary | ICD-10-CM | POA: Diagnosis not present

## 2012-03-12 DIAGNOSIS — S72409A Unspecified fracture of lower end of unspecified femur, initial encounter for closed fracture: Secondary | ICD-10-CM | POA: Diagnosis not present

## 2012-03-12 DIAGNOSIS — S82109A Unspecified fracture of upper end of unspecified tibia, initial encounter for closed fracture: Secondary | ICD-10-CM | POA: Diagnosis not present

## 2012-03-12 DIAGNOSIS — Z0000 Encounter for general adult medical examination without abnormal findings: Secondary | ICD-10-CM | POA: Diagnosis not present

## 2012-03-12 DIAGNOSIS — I428 Other cardiomyopathies: Secondary | ICD-10-CM | POA: Diagnosis not present

## 2012-03-12 DIAGNOSIS — M79609 Pain in unspecified limb: Secondary | ICD-10-CM | POA: Diagnosis not present

## 2012-03-12 DIAGNOSIS — T148XXA Other injury of unspecified body region, initial encounter: Secondary | ICD-10-CM

## 2012-03-12 DIAGNOSIS — N529 Male erectile dysfunction, unspecified: Secondary | ICD-10-CM | POA: Diagnosis not present

## 2012-03-12 DIAGNOSIS — M1710 Unilateral primary osteoarthritis, unspecified knee: Secondary | ICD-10-CM | POA: Insufficient documentation

## 2012-03-12 DIAGNOSIS — M175 Other unilateral secondary osteoarthritis of knee: Secondary | ICD-10-CM

## 2012-03-12 DIAGNOSIS — Z125 Encounter for screening for malignant neoplasm of prostate: Secondary | ICD-10-CM | POA: Diagnosis not present

## 2012-03-12 DIAGNOSIS — E7800 Pure hypercholesterolemia, unspecified: Secondary | ICD-10-CM | POA: Diagnosis not present

## 2012-03-12 DIAGNOSIS — M1732 Unilateral post-traumatic osteoarthritis, left knee: Secondary | ICD-10-CM

## 2012-03-12 DIAGNOSIS — I10 Essential (primary) hypertension: Secondary | ICD-10-CM | POA: Diagnosis not present

## 2012-03-12 DIAGNOSIS — Z5181 Encounter for therapeutic drug level monitoring: Secondary | ICD-10-CM

## 2012-03-12 DIAGNOSIS — G8929 Other chronic pain: Secondary | ICD-10-CM | POA: Diagnosis not present

## 2012-03-12 DIAGNOSIS — M722 Plantar fascial fibromatosis: Secondary | ICD-10-CM

## 2012-03-12 DIAGNOSIS — M25569 Pain in unspecified knee: Secondary | ICD-10-CM | POA: Insufficient documentation

## 2012-03-12 DIAGNOSIS — X58XXXA Exposure to other specified factors, initial encounter: Secondary | ICD-10-CM | POA: Insufficient documentation

## 2012-03-12 DIAGNOSIS — M25579 Pain in unspecified ankle and joints of unspecified foot: Secondary | ICD-10-CM

## 2012-03-12 DIAGNOSIS — Z23 Encounter for immunization: Secondary | ICD-10-CM | POA: Diagnosis not present

## 2012-03-12 DIAGNOSIS — J45909 Unspecified asthma, uncomplicated: Secondary | ICD-10-CM | POA: Diagnosis not present

## 2012-03-12 MED ORDER — OXYCODONE HCL 15 MG PO TABS: 15.0000 mg | ORAL_TABLET | Freq: Four times a day (QID) | ORAL | Status: DC | PRN

## 2012-03-12 NOTE — Patient Instructions (Signed)
Start working out in the pool, when the wound care center approves that.

## 2012-03-12 NOTE — Progress Notes (Signed)
Subjective:    Patient ID: Victor [name], male    DOB: 1961-04-08, 51 y.o.   MRN: [id number]  HPI [name] His knee pain is doing better overall, but his lower leg pain is still aggrevating. He feels that the lyrica has helped . He reports that he is taking the Lyrica tid now, which gives him more relief. He's using 3 oxy's aday currently. He's trying to stay somewhat active at home, he has looked into the [employer] program, and [place] [employer] [place]. He states, that he has an open wound, and would like to be referred to the wound center.   Pain Inventory Average Pain 8 Pain Right Now 8 My pain is sharp, dull, tingling and aching  In the last 24 hours, has pain interfered with the following? General activity 8 Relation with others 8 Enjoyment of life 8 What TIME of day is your pain at its worst? morning and night Sleep (in general) Fair  Pain is worse with: walking, bending and standing Pain improves with: medication Relief from Meds: 6  Mobility walk without assistance how many minutes can you walk? 30 ability to climb steps?  yes do you drive?  yes  Function disabled: date disabled 2007 retired I need assistance with the following:  shopping Do you have any goals in this area?  no  Neuro/Psych numbness anxiety  Prior Studies Any changes since last visit?  no  Physicians involved in your care Any changes since last visit?  no   Family History  Problem Relation Age of Onset  . Kidney disease Father    History   Social History  . Marital Status: Married    Spouse Name: N/A    Number of Children: N/A  . Years of Education: N/A   Social History Main Topics  . Smoking status: Never Smoker   . Smokeless tobacco: Never Used  . Alcohol Use: Yes  . Drug Use: None  . Sexually Active: None   Other Topics Concern  . None   Social History Narrative  . None   History reviewed. No pertinent  past surgical history. Past Medical History  Diagnosis Date  . Closed fracture of tibia, upper end   . Lesion of lateral popliteal nerve   . Primary localized osteoarthrosis, lower leg   . Primary localized osteoarthrosis, upper arm   . Hypertension    BP 120/71  Pulse 84  Resp 16  Ht 6\' 1"  (1.854 m)  Wt 290 lb (131.543 kg)  BMI 38.26 kg/m2  SpO2 94%     Review of Systems  Musculoskeletal: Positive for gait problem.  Neurological: Positive for numbness.  Psychiatric/Behavioral: The patient is nervous/anxious.   All other systems reviewed and are negative.       Objective:   Physical Exam Constitutional: He is oriented to person, place, and time. He appears well-developed and well-nourished.  HENT:  Head: Normocephalic and atraumatic.  Eyes: Conjunctivae and EOM are normal. Pupils are equal, round, and reactive to light.  Neck: Normal range of motion.  Cardiovascular: Normal rate and regular rhythm.  Pulmonary/Chest: Effort normal and breath sounds normal.  Abdominal: Soft. Bowel sounds are normal.  Musculoskeletal: left knee full extension, flexion 90-100 degrees, Left ankle extension 0/90 degrees, plantar flex 20 degrees. Tibialis anterior 3_-/5, ext. Hall. Long. 0/5 Mild to moderate swelling around the left knee. Crepitus is persistent there. Deformities persistent aroound the left leg.  Neurological: He is alert and oriented to person, place, and time.  Chronic changes in left leg. Persistent weakness and limtied rom in the left ankle.  Skin: Skin is warm. Small almost open 5 mm diameter wound on left tibia, no redness or increased temperature. Psychiatric: He has a normal mood and affect. His behavior is normal. Judgment and thought content normal.         Assessment & Plan:  1. Left lower extremity trauma with tibial plateau fracture and distal  femur fracture with multifactorial pain and arthritis at the joint.  2. Left Peroneal nerve injury.  PLAN:  1.  Discussed management of edema, use of ice etc. Walking exercising in the pool. He will have long term difficulties with swelling based on his activity levels, weight, etc.  2. Continue lyrica 150mg  t.i.d. for his neuropathic  pain. This has helped quite a bit. He may continue with meloxicam and use this prn.  3.Continue Oxycodone 15 mg 1 q.8 h. p.r.n., #90. He's generally using 3 a day at this point. Explained to patient , that all his medication works together, and that he should take his meloxicam and Lyrica as prescribed, befor we would think about increasing his Oxycodone, I also explained that most of his pain is nerve pain, and therefore he should take his Lyrica as prescribed, and for his ankle joint pain he should also take his Mobic, which he has not done in the last month.  4. left custom compression stocking/ leg sleeve is helpful.  5. He will need to explore sedentary work options if he hopes to return to the work force.  6. Advised patient to join a gym with a pool, which would be very beneficial to build up his strength, losing weight, and it would also help with the edema; also recommended to look into the silver sneakers program, which he has done since his last visit, and he will [place] in the [facility] [place].  F/u in about 1 month with our PA

## 2012-03-18 DIAGNOSIS — H278 Other specified disorders of lens: Secondary | ICD-10-CM | POA: Insufficient documentation

## 2012-03-18 DIAGNOSIS — H269 Unspecified cataract: Secondary | ICD-10-CM | POA: Diagnosis not present

## 2012-03-18 DIAGNOSIS — H2510 Age-related nuclear cataract, unspecified eye: Secondary | ICD-10-CM | POA: Insufficient documentation

## 2012-03-18 DIAGNOSIS — H40009 Preglaucoma, unspecified, unspecified eye: Secondary | ICD-10-CM | POA: Diagnosis not present

## 2012-03-26 ENCOUNTER — Ambulatory Visit: Payer: Medicare Other | Admitting: Physical Medicine and Rehabilitation

## 2012-04-09 ENCOUNTER — Ambulatory Visit (HOSPITAL_COMMUNITY)
Admission: RE | Admit: 2012-04-09 | Discharge: 2012-04-09 | Disposition: A | Discharge status: Home / Self Care | Payer: Medicare Other | Source: Ambulatory Visit | Attending: General Surgery | Admitting: General Surgery

## 2012-04-09 ENCOUNTER — Other Ambulatory Visit (HOSPITAL_BASED_OUTPATIENT_CLINIC_OR_DEPARTMENT_OTHER): Payer: Self-pay | Admitting: General Surgery

## 2012-04-09 ENCOUNTER — Encounter (HOSPITAL_BASED_OUTPATIENT_CLINIC_OR_DEPARTMENT_OTHER): Payer: Medicare Other | Attending: General Surgery

## 2012-04-09 ENCOUNTER — Encounter: Payer: Self-pay | Admitting: Physical Medicine and Rehabilitation

## 2012-04-09 ENCOUNTER — Encounter
Payer: Medicare Other | Attending: Physical Medicine and Rehabilitation | Admitting: Physical Medicine and Rehabilitation

## 2012-04-09 VITALS — BP 129/79 | HR 86 | Resp 14 | Ht 72.0 in | Wt 287.0 lb

## 2012-04-09 DIAGNOSIS — M25569 Pain in unspecified knee: Secondary | ICD-10-CM

## 2012-04-09 DIAGNOSIS — S72409A Unspecified fracture of lower end of unspecified femur, initial encounter for closed fracture: Secondary | ICD-10-CM | POA: Diagnosis not present

## 2012-04-09 DIAGNOSIS — T8189XA Other complications of procedures, not elsewhere classified, initial encounter: Secondary | ICD-10-CM | POA: Diagnosis not present

## 2012-04-09 DIAGNOSIS — M869 Osteomyelitis, unspecified: Secondary | ICD-10-CM | POA: Diagnosis not present

## 2012-04-09 DIAGNOSIS — M175 Other unilateral secondary osteoarthritis of knee: Secondary | ICD-10-CM | POA: Diagnosis not present

## 2012-04-09 DIAGNOSIS — M722 Plantar fascial fibromatosis: Secondary | ICD-10-CM

## 2012-04-09 DIAGNOSIS — X58XXXA Exposure to other specified factors, initial encounter: Secondary | ICD-10-CM | POA: Insufficient documentation

## 2012-04-09 DIAGNOSIS — M1732 Unilateral post-traumatic osteoarthritis, left knee: Secondary | ICD-10-CM

## 2012-04-09 DIAGNOSIS — S82109A Unspecified fracture of upper end of unspecified tibia, initial encounter for closed fracture: Secondary | ICD-10-CM | POA: Diagnosis not present

## 2012-04-09 DIAGNOSIS — S8410XA Injury of peroneal nerve at lower leg level, unspecified leg, initial encounter: Secondary | ICD-10-CM | POA: Diagnosis not present

## 2012-04-09 DIAGNOSIS — T07XXXA Unspecified multiple injuries, initial encounter: Secondary | ICD-10-CM | POA: Diagnosis not present

## 2012-04-09 DIAGNOSIS — Y838 Other surgical procedures as the cause of abnormal reaction of the patient, or of later complication, without mention of misadventure at the time of the procedure: Secondary | ICD-10-CM | POA: Insufficient documentation

## 2012-04-09 DIAGNOSIS — M25579 Pain in unspecified ankle and joints of unspecified foot: Secondary | ICD-10-CM

## 2012-04-09 MED ORDER — OXYCODONE HCL 15 MG PO TABS: 15.0000 mg | ORAL_TABLET | Freq: Four times a day (QID) | ORAL | Status: DC | PRN

## 2012-04-09 NOTE — Patient Instructions (Signed)
Follow up with wound care, and then try to start aquatic exercises, when the wound has healed completely.

## 2012-04-09 NOTE — Progress Notes (Signed)
Subjective:    Patient ID: Victor [name], male    DOB: October 22, 1960, 51 y.o.   MRN: [id number]  HPI Victor [name] is back regarding his chronic left knee and leg pain. His knee pain is doing better overall, but his lower leg pain is still aggrevating. He feels that the lyrica has helped . He reports that he is taking the Lyrica tid now, which gives him more relief. He's using 3 oxy's aday currently.  He's trying to stay somewhat active at home, he has looked into the Silver sneakers program, and he will start [place] the [facility] soon. He states, that he will follow up with the wound center for his open wound today.   Pain Inventory Average Pain 9 Pain Right Now 9 My pain is sharp, dull, tingling and aching  In the last 24 hours, has pain interfered with the following? General activity 8 Relation with others 8 Enjoyment of life 8 What TIME of day is your pain at its worst? morning and night Sleep (in general) Poor  Pain is worse with: walking, sitting, standing and some activites Pain improves with: rest, therapy/exercise and medication Relief from Meds: 5  Mobility walk without assistance how many minutes can you walk? 30 ability to climb steps?  yes do you drive?  yes Do you have any goals in this area?  yes  Function retired  Neuro/Psych numbness tingling  Prior Studies Any changes since last visit?  no  Physicians involved in your care Any changes since last visit?  no   Family History  Problem Relation Age of Onset  . Kidney disease Father    History   Social History  . Marital Status: Married    Spouse Name: N/A    Number of Children: N/A  . Years of Education: N/A   Social History Main Topics  . Smoking status: Never Smoker   . Smokeless tobacco: Never Used  . Alcohol Use: Yes  . Drug Use: None  . Sexually Active: None   Other Topics Concern  . None   Social History Narrative  . None   History reviewed. No pertinent past surgical history. Past Medical  History  Diagnosis Date  . Closed fracture of tibia, upper end   . Lesion of lateral popliteal nerve   . Primary localized osteoarthrosis, lower leg   . Primary localized osteoarthrosis, upper arm   . Hypertension    BP 129/79  Pulse 86  Resp 14  Ht 6' (1.829 m)  Wt 287 lb (130.182 kg)  BMI 38.92 kg/m2  SpO2 93%        Review of Systems  Musculoskeletal: Positive for gait problem.  Neurological: Positive for numbness.  All other systems reviewed and are negative.       Objective:   Physical Exam Constitutional: He is oriented to person, place, and time. He appears well-developed and well-nourished.  HENT:  Head: Normocephalic and atraumatic.  Eyes: Conjunctivae and EOM are normal. Pupils are equal, round, and reactive to light.  Neck: Normal range of motion.  Cardiovascular: Normal rate and regular rhythm.  Pulmonary/Chest: Effort normal and breath sounds normal.  Abdominal: Soft. Bowel sounds are normal.  Musculoskeletal: left knee full extension, flexion 90-100 degrees, Left ankle extension 0/90 degrees, plantar flex 20 degrees. Tibialis anterior 3_-/5, ext. Hall. Long. 0/5 Mild to moderate swelling around the left knee. Crepitus is persistent there. Deformities persistent aroound the left leg.  Neurological: He is alert and oriented to person, place, and time.  Chronic changes in left leg. Persistent weakness and limtied rom in the left ankle.  Skin: Skin is warm. Small  open 7 mm diameter wound on left tibia, no redness or increased temperature. Psychiatric: He has a normal mood and affect. His behavior is normal. Judgment and thought content normal.         Assessment & Plan:     1. Left lower extremity trauma with tibial plateau fracture and distal  femur fracture with multifactorial pain and arthritis at the joint.  2. Left Peroneal nerve injury.  PLAN:  1. Discussed management of edema, use of ice etc. Walking exercising in the pool. He will have  long term difficulties with swelling based on his activity levels, weight, etc.  2. Continue lyrica 150mg  t.i.d. for his neuropathic  pain. This has helped quite a bit. He may continue with meloxicam and use this prn.  3.Continue Oxycodone 15 mg 1 q.8 h. p.r.n., #90. He's generally using 3 a day at this point. Explained to patient , that all his medication works together, and that he should take his meloxicam and Lyrica as prescribed, before we would think about increasing his Oxycodone, I also explained that most of his pain is nerve pain, and therefore he should take his Lyrica as prescribed, and for his ankle joint pain he should also take his Mobic.  4. left custom compression stocking/ leg sleeve is helpful.  5. He will need to explore sedentary work options if he hopes to return to the work force.  6. Advised patient to join a gym with a pool, which would be very beneficial to build up his strength, losing weight, and it would also help with the edema; also recommended to look into the silver sneakers program, which he has done since his last visit, and he will start in the [place] soon.  He will follow up with wound care today for his 7mm open wound on his left shin, he wants to start aquatic exercises after the wound has healed completely. F/u in about 1 month with our PA
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
## 2012-04-16 DIAGNOSIS — T8189XA Other complications of procedures, not elsewhere classified, initial encounter: Secondary | ICD-10-CM | POA: Diagnosis not present

## 2012-04-23 ENCOUNTER — Telehealth: Payer: Self-pay

## 2012-04-23 DIAGNOSIS — T8189XA Other complications of procedures, not elsewhere classified, initial encounter: Secondary | ICD-10-CM | POA: Diagnosis not present

## 2012-04-23 MED ORDER — SILDENAFIL CITRATE 50 MG PO TABS: 50.0000 mg | ORAL_TABLET | Freq: Every day | ORAL | Status: DC | PRN

## 2012-04-23 NOTE — Telephone Encounter (Signed)
Left message on patients voicemail that prescription was escribed to [facility]

## 2012-04-23 NOTE — Telephone Encounter (Signed)
Needs viagra refill.  [facility] [address]

## 2012-05-08 DIAGNOSIS — I10 Essential (primary) hypertension: Secondary | ICD-10-CM | POA: Diagnosis not present

## 2012-05-08 DIAGNOSIS — J45909 Unspecified asthma, uncomplicated: Secondary | ICD-10-CM | POA: Diagnosis not present

## 2012-05-08 DIAGNOSIS — Z7982 Long term (current) use of aspirin: Secondary | ICD-10-CM | POA: Diagnosis not present

## 2012-05-08 DIAGNOSIS — Z79899 Other long term (current) drug therapy: Secondary | ICD-10-CM | POA: Diagnosis not present

## 2012-05-08 DIAGNOSIS — H269 Unspecified cataract: Secondary | ICD-10-CM | POA: Diagnosis not present

## 2012-05-09 ENCOUNTER — Encounter: Payer: Self-pay | Admitting: Physical Medicine and Rehabilitation

## 2012-05-09 ENCOUNTER — Encounter
Payer: Medicare Other | Attending: Physical Medicine and Rehabilitation | Admitting: Physical Medicine and Rehabilitation

## 2012-05-09 ENCOUNTER — Encounter: Payer: Medicare Other | Admitting: Physical Medicine and Rehabilitation

## 2012-05-09 VITALS — BP 141/88 | HR 87 | Resp 14 | Ht 73.0 in | Wt 291.4 lb

## 2012-05-09 DIAGNOSIS — M25579 Pain in unspecified ankle and joints of unspecified foot: Secondary | ICD-10-CM | POA: Diagnosis not present

## 2012-05-09 DIAGNOSIS — M25569 Pain in unspecified knee: Secondary | ICD-10-CM

## 2012-05-09 DIAGNOSIS — M1732 Unilateral post-traumatic osteoarthritis, left knee: Secondary | ICD-10-CM

## 2012-05-09 DIAGNOSIS — X58XXXA Exposure to other specified factors, initial encounter: Secondary | ICD-10-CM | POA: Insufficient documentation

## 2012-05-09 DIAGNOSIS — S8410XA Injury of peroneal nerve at lower leg level, unspecified leg, initial encounter: Secondary | ICD-10-CM | POA: Insufficient documentation

## 2012-05-09 DIAGNOSIS — M722 Plantar fascial fibromatosis: Secondary | ICD-10-CM | POA: Diagnosis not present

## 2012-05-09 DIAGNOSIS — M175 Other unilateral secondary osteoarthritis of knee: Secondary | ICD-10-CM | POA: Diagnosis not present

## 2012-05-09 MED ORDER — OXYCODONE HCL 15 MG PO TABS: 15.0000 mg | ORAL_TABLET | Freq: Four times a day (QID) | ORAL | Status: DC | PRN

## 2012-05-09 NOTE — Progress Notes (Signed)
Subjective:    Patient ID: Victor [name], male    DOB: [birth year], 52 y.o.   MRN: [id number]  HPI Victor [name] is back regarding his chronic left knee and leg pain. His knee pain is doing better overall, but his lower leg pain is still aggrevating. He feels that the lyrica has helped . He reports that he is taking the Lyrica tid now, which gives him more relief. He's using 3 oxy's aday currently.  He's trying to stay somewhat active at home, he has looked into the Silver sneakers program, and he will [place] the [facility] soon. He states, that he is following up with the wound center for his open wound on his left lower leg. He reports, that he had catarrac surgery yesterday and is not feeling well today.    Pain Inventory Average Pain 8 Pain Right Now 9 My pain is tingling and aching  In the last 24 hours, has pain interfered with the following? General activity 8 Relation with others 8 Enjoyment of life 8 What TIME of day is your pain at its worst? morning and night Sleep (in general) Fair  Pain is worse with: walking, bending and standing Pain improves with: rest and medication Relief from Meds: 8  Mobility walk without assistance how many minutes can you walk? 50 ability to climb steps?  yes  Function disabled: date disabled  retired  Neuro/Psych No problems in this area  Prior Studies Any changes since last visit?  no  Physicians involved in your care Any changes since last visit?  no   Family History  Problem Relation Age of Onset  . Kidney disease Father    History   Social History  . Marital Status: Married    Spouse Name: N/A    Number of Children: N/A  . Years of Education: N/A   Social History Main Topics  . Smoking status: Never Smoker   . Smokeless tobacco: Never Used  . Alcohol Use: Yes  . Drug Use: None  . Sexually Active: None   Other Topics Concern  . None   Social History Narrative  . None   History reviewed. No pertinent past surgical  history. Past Medical History  Diagnosis Date  . Closed fracture of tibia, upper end   . Lesion of lateral popliteal nerve   . Primary localized osteoarthrosis, lower leg   . Primary localized osteoarthrosis, upper arm   . Hypertension    BP 141/88  Pulse 87  Resp 14  Ht 6\' 1"  (1.854 m)  Wt 291 lb 6.4 oz (132.178 kg)  BMI 38.45 kg/m2  SpO2 95%    Review of Systems  All other systems reviewed and are negative.       Objective:   Physical Exam Constitutional: He is oriented to person, place, and time. He appears well-developed and well-nourished.  HENT:  Head: Normocephalic and atraumatic.  Eyes: Conjunctivae and EOM are normal. Pupils are equal, round, and reactive to light.  Neck: Normal range of motion.  Cardiovascular: Normal rate and regular rhythm.  Pulmonary/Chest: Effort normal and breath sounds normal.  Abdominal: Soft. Bowel sounds are normal.  Musculoskeletal: left knee full extension, flexion 90-100 degrees, Left ankle extension 0/90 degrees, plantar flex 20 degrees. Tibialis anterior 3_-/5, ext. Hall. Long. 0/5 Mild to moderate swelling around the left knee. Crepitus is persistent there. Deformities persistent aroound the left leg.  Neurological: He is alert and oriented to person, place, and time.  Chronic changes in left leg. Persistent  weakness and limtied rom in the left ankle.  Skin: Skin is warm. Small open 7 mm diameter wound on left tibia, no redness or increased temperature. Psychiatric: He has a normal mood and affect. His behavior is normal. Judgment and thought content normal.         Assessment & Plan:  1. Left lower extremity trauma with tibial plateau fracture and distal  femur fracture with multifactorial pain and arthritis at the joint.  2. Left Peroneal nerve injury.  PLAN:  1. Discussed management of edema, use of ice etc. Walking exercising in the pool. He will have long term difficulties with swelling based on his activity levels,  weight, etc.  2. Continue lyrica 150mg  t.i.d. for his neuropathic  pain. This has helped quite a bit. He may continue with meloxicam and use this prn.  3.Continue Oxycodone 15 mg 1 q.8 h. p.r.n., #90. He's generally using 3 a day at this point. Explained to patient , that all his medication works together, and that he should take his meloxicam and Lyrica as prescribed, before we would think about increasing his Oxycodone, I also explained that most of his pain is nerve pain, and therefore he should take his Lyrica as prescribed, and for his ankle joint pain he should also take his Mobic.  4. left custom compression stocking/ leg sleeve is helpful.  5. He will need to explore sedentary work options if he hopes to return to the work force.  6. Advised patient to join a gym with a pool, which would be very beneficial to build up his strength, losing weight, and it would also help with the edema; also recommended to look into the silver sneakers program, which he has done since his last visit, and he will start in the [place] soon.  He is still following up with wound care for his 7mm open wound on his left shin, he wants to start aquatic exercises after the wound has healed completely.  7. Catarrac surgery on 05/08/2012, patient has a headache and is not feeling well today. F/u in about 1 month with our PA

## 2012-05-09 NOTE — Patient Instructions (Signed)
Try to relax and take it easy until you feel better.

## 2012-05-14 ENCOUNTER — Encounter (HOSPITAL_BASED_OUTPATIENT_CLINIC_OR_DEPARTMENT_OTHER): Payer: Medicare Other | Attending: General Surgery

## 2012-05-14 DIAGNOSIS — S81009A Unspecified open wound, unspecified knee, initial encounter: Secondary | ICD-10-CM | POA: Insufficient documentation

## 2012-05-14 DIAGNOSIS — Y832 Surgical operation with anastomosis, bypass or graft as the cause of abnormal reaction of the patient, or of later complication, without mention of misadventure at the time of the procedure: Secondary | ICD-10-CM | POA: Insufficient documentation

## 2012-05-14 DIAGNOSIS — T8189XA Other complications of procedures, not elsewhere classified, initial encounter: Secondary | ICD-10-CM | POA: Diagnosis not present

## 2012-05-23 ENCOUNTER — Telehealth: Payer: Self-pay | Admitting: *Deleted

## 2012-05-23 DIAGNOSIS — Z961 Presence of intraocular lens: Secondary | ICD-10-CM | POA: Insufficient documentation

## 2012-05-23 MED ORDER — SILDENAFIL CITRATE 50 MG PO TABS: 50.0000 mg | ORAL_TABLET | Freq: Every day | ORAL | Status: DC | PRN

## 2012-05-23 NOTE — Telephone Encounter (Signed)
Refill viagra 

## 2012-06-04 ENCOUNTER — Encounter: Payer: Self-pay | Admitting: Physical Medicine and Rehabilitation

## 2012-06-04 ENCOUNTER — Encounter
Payer: Medicare Other | Attending: Physical Medicine and Rehabilitation | Admitting: Physical Medicine and Rehabilitation

## 2012-06-04 VITALS — BP 142/77 | HR 84 | Resp 14 | Ht 73.0 in | Wt 295.0 lb

## 2012-06-04 DIAGNOSIS — M25569 Pain in unspecified knee: Secondary | ICD-10-CM | POA: Insufficient documentation

## 2012-06-04 DIAGNOSIS — S346XXS Injury of peripheral nerve(s) at abdomen, lower back and pelvis level, sequela: Secondary | ICD-10-CM | POA: Diagnosis not present

## 2012-06-04 DIAGNOSIS — M79609 Pain in unspecified limb: Secondary | ICD-10-CM | POA: Insufficient documentation

## 2012-06-04 DIAGNOSIS — G8929 Other chronic pain: Secondary | ICD-10-CM | POA: Diagnosis not present

## 2012-06-04 DIAGNOSIS — M175 Other unilateral secondary osteoarthritis of knee: Secondary | ICD-10-CM

## 2012-06-04 DIAGNOSIS — M722 Plantar fascial fibromatosis: Secondary | ICD-10-CM | POA: Diagnosis not present

## 2012-06-04 DIAGNOSIS — S8410XA Injury of peroneal nerve at lower leg level, unspecified leg, initial encounter: Secondary | ICD-10-CM

## 2012-06-04 DIAGNOSIS — M25579 Pain in unspecified ankle and joints of unspecified foot: Secondary | ICD-10-CM

## 2012-06-04 DIAGNOSIS — S8290XS Unspecified fracture of unspecified lower leg, sequela: Secondary | ICD-10-CM | POA: Diagnosis not present

## 2012-06-04 DIAGNOSIS — X58XXXS Exposure to other specified factors, sequela: Secondary | ICD-10-CM | POA: Insufficient documentation

## 2012-06-04 DIAGNOSIS — M1710 Unilateral primary osteoarthritis, unspecified knee: Secondary | ICD-10-CM | POA: Diagnosis not present

## 2012-06-04 DIAGNOSIS — M1732 Unilateral post-traumatic osteoarthritis, left knee: Secondary | ICD-10-CM

## 2012-06-04 DIAGNOSIS — S8490XS Injury of unspecified nerve at lower leg level, unspecified leg, sequela: Secondary | ICD-10-CM | POA: Insufficient documentation

## 2012-06-04 MED ORDER — OXYCODONE HCL 15 MG PO TABS: 15.0000 mg | ORAL_TABLET | Freq: Four times a day (QID) | ORAL | Status: DC | PRN

## 2012-06-04 NOTE — Patient Instructions (Signed)
Continue with your walking program , try to get into an exercise program, aquatic exercises would be very beneficial , but only when your wound has healed completely.
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
## 2012-06-05 ENCOUNTER — Ambulatory Visit: Payer: Medicare Other | Admitting: Physical Medicine and Rehabilitation

## 2012-06-07 HISTORY — PX: CATARACT EXTRACTION: SUR2

## 2012-06-11 ENCOUNTER — Encounter (HOSPITAL_BASED_OUTPATIENT_CLINIC_OR_DEPARTMENT_OTHER): Payer: Medicare Other | Attending: General Surgery

## 2012-06-11 DIAGNOSIS — Z79899 Other long term (current) drug therapy: Secondary | ICD-10-CM | POA: Insufficient documentation

## 2012-06-11 DIAGNOSIS — I10 Essential (primary) hypertension: Secondary | ICD-10-CM | POA: Diagnosis not present

## 2012-06-11 DIAGNOSIS — J45909 Unspecified asthma, uncomplicated: Secondary | ICD-10-CM | POA: Insufficient documentation

## 2012-06-11 DIAGNOSIS — L97809 Non-pressure chronic ulcer of other part of unspecified lower leg with unspecified severity: Secondary | ICD-10-CM | POA: Diagnosis not present

## 2012-06-11 DIAGNOSIS — Z8781 Personal history of (healed) traumatic fracture: Secondary | ICD-10-CM | POA: Diagnosis not present

## 2012-06-11 DIAGNOSIS — Z7982 Long term (current) use of aspirin: Secondary | ICD-10-CM | POA: Insufficient documentation

## 2012-06-16 DIAGNOSIS — L97809 Non-pressure chronic ulcer of other part of unspecified lower leg with unspecified severity: Secondary | ICD-10-CM | POA: Diagnosis not present

## 2012-06-16 DIAGNOSIS — Z79899 Other long term (current) drug therapy: Secondary | ICD-10-CM | POA: Diagnosis not present

## 2012-06-16 DIAGNOSIS — J45909 Unspecified asthma, uncomplicated: Secondary | ICD-10-CM | POA: Diagnosis not present

## 2012-06-16 DIAGNOSIS — T07XXXA Unspecified multiple injuries, initial encounter: Secondary | ICD-10-CM | POA: Diagnosis not present

## 2012-06-16 DIAGNOSIS — I10 Essential (primary) hypertension: Secondary | ICD-10-CM | POA: Diagnosis not present

## 2012-06-16 DIAGNOSIS — Z8781 Personal history of (healed) traumatic fracture: Secondary | ICD-10-CM | POA: Diagnosis not present

## 2012-06-16 DIAGNOSIS — Z7982 Long term (current) use of aspirin: Secondary | ICD-10-CM | POA: Diagnosis not present
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
## 2012-06-18 ENCOUNTER — Encounter (HOSPITAL_BASED_OUTPATIENT_CLINIC_OR_DEPARTMENT_OTHER): Payer: Medicare Other

## 2012-06-23 DIAGNOSIS — Z79899 Other long term (current) drug therapy: Secondary | ICD-10-CM | POA: Diagnosis not present

## 2012-06-23 DIAGNOSIS — Z7982 Long term (current) use of aspirin: Secondary | ICD-10-CM | POA: Diagnosis not present

## 2012-06-23 DIAGNOSIS — Z8781 Personal history of (healed) traumatic fracture: Secondary | ICD-10-CM | POA: Diagnosis not present

## 2012-06-23 DIAGNOSIS — J45909 Unspecified asthma, uncomplicated: Secondary | ICD-10-CM | POA: Diagnosis not present

## 2012-06-23 DIAGNOSIS — I10 Essential (primary) hypertension: Secondary | ICD-10-CM | POA: Diagnosis not present

## 2012-06-23 DIAGNOSIS — L97809 Non-pressure chronic ulcer of other part of unspecified lower leg with unspecified severity: Secondary | ICD-10-CM | POA: Diagnosis not present
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
## 2012-06-30 ENCOUNTER — Encounter
Payer: Medicare Other | Attending: Physical Medicine and Rehabilitation | Admitting: Physical Medicine and Rehabilitation

## 2012-06-30 ENCOUNTER — Encounter: Payer: Self-pay | Admitting: Physical Medicine and Rehabilitation

## 2012-06-30 VITALS — BP 133/74 | HR 84 | Resp 14 | Ht 73.0 in | Wt 289.4 lb

## 2012-06-30 DIAGNOSIS — S72409A Unspecified fracture of lower end of unspecified femur, initial encounter for closed fracture: Secondary | ICD-10-CM | POA: Diagnosis not present

## 2012-06-30 DIAGNOSIS — S82109A Unspecified fracture of upper end of unspecified tibia, initial encounter for closed fracture: Secondary | ICD-10-CM | POA: Insufficient documentation

## 2012-06-30 DIAGNOSIS — J45909 Unspecified asthma, uncomplicated: Secondary | ICD-10-CM | POA: Diagnosis not present

## 2012-06-30 DIAGNOSIS — M175 Other unilateral secondary osteoarthritis of knee: Secondary | ICD-10-CM

## 2012-06-30 DIAGNOSIS — G8929 Other chronic pain: Secondary | ICD-10-CM | POA: Insufficient documentation

## 2012-06-30 DIAGNOSIS — Z8781 Personal history of (healed) traumatic fracture: Secondary | ICD-10-CM | POA: Diagnosis not present

## 2012-06-30 DIAGNOSIS — Z79899 Other long term (current) drug therapy: Secondary | ICD-10-CM | POA: Diagnosis not present

## 2012-06-30 DIAGNOSIS — S8410XA Injury of peroneal nerve at lower leg level, unspecified leg, initial encounter: Secondary | ICD-10-CM | POA: Insufficient documentation

## 2012-06-30 DIAGNOSIS — L97809 Non-pressure chronic ulcer of other part of unspecified lower leg with unspecified severity: Secondary | ICD-10-CM | POA: Diagnosis not present

## 2012-06-30 DIAGNOSIS — M1732 Unilateral post-traumatic osteoarthritis, left knee: Secondary | ICD-10-CM

## 2012-06-30 DIAGNOSIS — M1710 Unilateral primary osteoarthritis, unspecified knee: Secondary | ICD-10-CM | POA: Diagnosis not present

## 2012-06-30 DIAGNOSIS — I10 Essential (primary) hypertension: Secondary | ICD-10-CM | POA: Diagnosis not present

## 2012-06-30 DIAGNOSIS — X58XXXA Exposure to other specified factors, initial encounter: Secondary | ICD-10-CM | POA: Insufficient documentation

## 2012-06-30 MED ORDER — OXYCODONE HCL 15 MG PO TABS: 15.0000 mg | ORAL_TABLET | Freq: Four times a day (QID) | ORAL | Status: DC | PRN

## 2012-06-30 NOTE — Patient Instructions (Signed)
Continue with your walking and exercise program 

## 2012-06-30 NOTE — Progress Notes (Signed)
Subjective:    Patient ID: Victor [name], male    DOB: March 26, 1961, 52 y.o.   MRN: [id number]  HPI [name] His knee pain is doing better overall, but his lower leg pain is still aggrevating. He feels that the lyrica has helped . He reports that he is taking the Lyrica tid now, which gives him more relief. He's using 3 oxy's aday currently, he states, that the cold weather is increasing his pain some.  He's trying to stay somewhat active at home, he has looked into the Silver sneakers program, and [place]. He states, that he is following up with the wound center for his open wound on his left lower leg.   Pain Inventory Average Pain 8 Pain Right Now 8 My pain is sharp, dull and aching  In the last 24 hours, has pain interfered with the following? General activity 8 Relation with others 8 Enjoyment of life 8 What TIME of day is your pain at its worst? morning and night Sleep (in general) Fair  Pain is worse with: walking, bending and standing Pain improves with: rest, heat/ice, therapy/exercise and medication Relief from Meds: 7  Mobility walk without assistance how many minutes can you walk? 30  Function disabled: date disabled see chart retired  Neuro/Psych numbness  Prior Studies Any changes since last visit?  no  Physicians involved in your care Any changes since last visit?  no   Family History  Problem Relation Age of Onset  . Kidney disease Father    History   Social History  . Marital Status: Married    Spouse Name: N/A    Number of Children: N/A  . Years of Education: N/A   Social History Main Topics  . Smoking status: Never Smoker   . Smokeless tobacco: Never Used  . Alcohol Use: Yes  . Drug Use: None  . Sexually Active: None   Other Topics Concern  . None   Social History Narrative  . None   History reviewed. No pertinent past surgical history. Past Medical History   Diagnosis Date  . Closed fracture of tibia, upper end   . Lesion of lateral popliteal nerve   . Primary localized osteoarthrosis, lower leg   . Primary localized osteoarthrosis, upper arm   . Hypertension    BP 133/74  Pulse 84  Resp 14  Ht 6\' 1"  (1.854 m)  Wt 289 lb 6.4 oz (131.271 kg)  BMI 38.19 kg/m2  SpO2 93%    Review of Systems  Neurological: Positive for numbness.  All other systems reviewed and are negative.       Objective:   Physical Exam Constitutional: He is oriented to person, place, and time. He appears well-developed and well-nourished.  HENT:  Head: Normocephalic and atraumatic.  Eyes: Conjunctivae and EOM are normal. Pupils are equal, round, and reactive to light.  Neck: Normal range of motion.  Cardiovascular: Normal rate and regular rhythm.  Pulmonary/Chest: Effort normal and breath sounds normal.  Abdominal: Soft. Bowel sounds are normal.  Musculoskeletal: left knee full extension, flexion 90-100 degrees, Left ankle extension 0/90 degrees, plantar flex 20 degrees. Tibialis anterior 3-/5, ext. Hall. Long. 0/5 Mild to moderate swelling around the left knee. Crepitus is persistent there. Deformities persistent aroound the left leg.  Neurological: He is alert and oriented to person, place, and time.  Chronic changes in left leg. Persistent weakness and limtied rom in  the left ankle.  Skin: Skin is warm. Small open 4 mm diameter wound on left tibia, no redness or increased temperature. Psychiatric: He has a normal mood and affect. His behavior is normal. Judgment and thought content normal.         Assessment & Plan:  1. Left lower extremity trauma with tibial plateau fracture and distal  femur fracture with multifactorial pain and arthritis at the joint.  2. Left Peroneal nerve injury.  PLAN:  1. Discussed management of edema, use of ice etc. Walking exercising in the pool. He will have long term difficulties with swelling based on his activity  levels, weight, etc.  2. Continue lyrica 150mg  t.i.d. for his neuropathic  pain. This has helped quite a bit. He may continue with meloxicam and use this prn.  3.Continue Oxycodone 15 mg 1 q.8 h. p.r.n., #90. He's generally using 3 a day at this point.He states, that he would like to increase his Oxycodone. Explained to patient , that all his medication works together, and that he should take his meloxicam and Lyrica as prescribed, before we would think about increasing his Oxycodone, I also explained that most of his pain is nerve pain, and therefore he should take his Lyrica as prescribed, and for his ankle joint pain he should also take his Mobic. I will talk to Dr. Riley Kill, if his pain does not get better with some warmer weather.  4. Left custom compression stocking/ leg sleeve is helpful.  5. He will need to explore sedentary work options if he hopes to return to the work force.  6. Advised patient to join a gym with a pool, which would be very beneficial to build up his strength, losing weight, and it would also help with the edema; also recommended to look into the silver sneakers program, which he has done since his last visit, and [place].  He is still following up with wound care for his 4mm open wound on his left shin, he wants to start aquatic exercises after the wound has healed completely.  7. Catarrac surgery on 05/08/2012.  F/u in about 1 month with our PA

## 2012-06-30 NOTE — Progress Notes (Signed)
[facility]  NAME:  Victor [name], Victor [name] NO.:  [id number]  MEDICAL RECORD NO.:  [id number]      DATE OF BIRTH:  June 02, 1960  PHYSICIAN:  Wayland Denis, DO       VISIT DATE:  06/30/2012                                  OFFICE VISIT   The patient is a 52 year old gentleman, who is here for followup on his left leg ulcer.  He underwent a severe accident with an open tib-fib approximately 8 years ago with a rectus flap reconstruction.  He has an ulcer that we have been dealing with it, no bigger than the size of a Q- tip.  It is actually filling in and does look like it is improving.  We have been using Nu-Gauze on it.  The patient's history is unchanged.  REVIEW OF SYSTEMS:  Negative.  MEDICATIONS:  Unchanged.  PHYSICAL EXAMINATION:  GENERAL:  He is alert, oriented, cooperative, not in any acute distress.  He is pleasant. HEENT:  Pupils equal.  Extraocular muscles are intact.  No cervical lymphadenopathy. LUNGS:  His breathing is unlabored. ABDOMEN:  Large, but soft.  No tenderness.  The wound does not appear to be infected.  It does look better.  It is pink and granulating inside.  There is no redness or swelling of any significance.  We will continue with the Nu Gauze.  He has not had added protein to his diet, so I have stressed the importance of that and we will see him back in a week.     Wayland Denis, DO     CS/MEDQ  D:  06/30/2012  T:  06/30/2012  Job:  [id number]

## 2012-07-02 ENCOUNTER — Ambulatory Visit: Payer: Medicare Other | Admitting: Physical Medicine and Rehabilitation

## 2012-07-07 ENCOUNTER — Encounter (HOSPITAL_BASED_OUTPATIENT_CLINIC_OR_DEPARTMENT_OTHER): Payer: Medicare Other | Attending: Plastic Surgery

## 2012-07-28 ENCOUNTER — Encounter
Payer: Medicare Other | Attending: Physical Medicine and Rehabilitation | Admitting: Physical Medicine and Rehabilitation

## 2012-07-28 ENCOUNTER — Encounter: Payer: Self-pay | Admitting: Physical Medicine and Rehabilitation

## 2012-07-28 VITALS — BP 140/87 | HR 94 | Resp 14 | Ht 73.0 in | Wt 291.0 lb

## 2012-07-28 DIAGNOSIS — S82109A Unspecified fracture of upper end of unspecified tibia, initial encounter for closed fracture: Secondary | ICD-10-CM | POA: Diagnosis not present

## 2012-07-28 DIAGNOSIS — M175 Other unilateral secondary osteoarthritis of knee: Secondary | ICD-10-CM | POA: Diagnosis not present

## 2012-07-28 DIAGNOSIS — X58XXXA Exposure to other specified factors, initial encounter: Secondary | ICD-10-CM | POA: Insufficient documentation

## 2012-07-28 DIAGNOSIS — G8929 Other chronic pain: Secondary | ICD-10-CM | POA: Diagnosis not present

## 2012-07-28 DIAGNOSIS — S8410XA Injury of peroneal nerve at lower leg level, unspecified leg, initial encounter: Secondary | ICD-10-CM | POA: Diagnosis not present

## 2012-07-28 DIAGNOSIS — M1710 Unilateral primary osteoarthritis, unspecified knee: Secondary | ICD-10-CM | POA: Insufficient documentation

## 2012-07-28 DIAGNOSIS — S72409A Unspecified fracture of lower end of unspecified femur, initial encounter for closed fracture: Secondary | ICD-10-CM | POA: Diagnosis not present

## 2012-07-28 DIAGNOSIS — S8412XS Injury of peroneal nerve at lower leg level, left leg, sequela: Secondary | ICD-10-CM

## 2012-07-28 DIAGNOSIS — M1732 Unilateral post-traumatic osteoarthritis, left knee: Secondary | ICD-10-CM

## 2012-07-28 MED ORDER — OXYCODONE HCL 15 MG PO TABS: 15.0000 mg | ORAL_TABLET | Freq: Four times a day (QID) | ORAL | Status: DC | PRN

## 2012-07-28 NOTE — Progress Notes (Signed)
Subjective:    Patient ID: Victor [name], male    DOB: 29-Jan-1961, 52 y.o.   MRN: [id number]  HPI Victor [name] is back regarding his chronic left knee and leg pain. His knee pain is doing better overall, but his lower leg pain is still aggrevating. He feels that the lyrica has helped . He reports that he is taking the Lyrica tid now, which gives him more relief. He's using 3 oxy's aday currently, he states, that the cold weather is increasing his pain some.  He's trying to stay somewhat active at home, he has looked into the [employer] sneakers program, and [place] [employer] [place]. He states, that he is not following up with the wound center, his open wound on his left lower leg has healed. He states, that he starts to work out in the [place] today, with a Systems analyst.   Pain Inventory Average Pain 8 Pain Right Now 9 My pain is sharp, dull, tingling and aching  In the last 24 hours, has pain interfered with the following? General activity 8 Relation with others 8 Enjoyment of life 8 What TIME of day is your pain at its worst? morning and night Sleep (in general) Fair  Pain is worse with: walking, bending and standing Pain improves with: rest, therapy/exercise and medication Relief from Meds: 7  Mobility walk without assistance how many minutes can you walk? 30 ability to climb steps?  yes do you drive?  yes Do you have any goals in this area?  yes  Function disabled: date disabled see chart retired Do you have any goals in this area?  no  Neuro/Psych numbness  Prior Studies Any changes since last visit?  no  Physicians involved in your care Any changes since last visit?  no   Family History  Problem Relation Age of Onset  . Kidney disease Father    History   Social History  . Marital Status: Married    Spouse Name: N/A    Number of Children: N/A  . Years of Education: N/A   Social History Main Topics  . Smoking status: Never Smoker   . Smokeless tobacco:  Never Used  . Alcohol Use: Yes  . Drug Use: None  . Sexually Active: None   Other Topics Concern  . None   Social History Narrative  . None   History reviewed. No pertinent past surgical history. Past Medical History  Diagnosis Date  . Closed fracture of tibia, upper end   . Lesion of lateral popliteal nerve   . Primary localized osteoarthrosis, lower leg   . Primary localized osteoarthrosis, upper arm   . Hypertension    BP 140/87  Pulse 94  Resp 14  Ht 6\' 1"  (1.854 m)  Wt 291 lb (131.997 kg)  BMI 38.4 kg/m2  SpO2 95%     Review of Systems  Neurological: Positive for numbness.  All other systems reviewed and are negative.       Objective:   Physical Exam Constitutional: He is oriented to person, place, and time. He appears well-developed and well-nourished.  HENT:  Head: Normocephalic and atraumatic.  Eyes: Conjunctivae and EOM are normal. Pupils are equal, round, and reactive to light.  Neck: Normal range of motion.  Cardiovascular: Normal rate and regular rhythm.  Pulmonary/Chest: Effort normal and breath sounds normal.  Abdominal: Soft. Bowel sounds are normal.  Musculoskeletal: left knee full extension, flexion 90-100 degrees, Left ankle extension 0/90 degrees, plantar flex 20 degrees. Tibialis  anterior 3-/5, ext. Hall. Long. 0/5 Mild to moderate swelling around the left knee. Crepitus is persistent there. Deformities persistent aroound the left leg.  Neurological: He is alert and oriented to person, place, and time.  Chronic changes in left leg. Persistent weakness and limtied rom in the left ankle.  Skin: Skin is warm. Small open 4 mm diameter wound on left tibia, no redness or increased temperature. Psychiatric: He has a normal mood and affect. His behavior is normal. Judgment and thought content normal.         Assessment & Plan:  1. Left lower extremity trauma with tibial plateau fracture and distal  femur fracture with multifactorial pain and  arthritis at the joint.  2. Left Peroneal nerve injury.  PLAN:  1. Discussed management of edema, use of ice etc. Walking exercising in the pool. He will have long term difficulties with swelling based on his activity levels, weight, etc.  2. Continue lyrica 150mg  t.i.d. for his neuropathic  pain. This has helped quite a bit. He may continue with meloxicam and use this prn.  3.Continue Oxycodone 15 mg 1 q.8 h. p.r.n., #90. He's generally using 3 a day at this point.He states, that he would like to increase his Oxycodone. Explained to patient , that all his medication works together, and that he should take his meloxicam and Lyrica as prescribed, before we would think about increasing his Oxycodone, I also explained that most of his pain is nerve pain, and therefore he should take his Lyrica as prescribed, and for his ankle joint pain he should also take his Mobic. I will talk to Victor [name], if his pain does not get better with some warmer weather.  4. Left custom compression stocking/ leg sleeve is helpful.  5. He will need to explore sedentary work options if he hopes to return to the work force.  6. Advised patient to join a gym with a pool, which would be very beneficial to build up his strength, losing weight, and it would also help with the edema;  [place] ! He is not following up with wound care anymore, his open wound on his left shin,has healed.  7. Catarrac surgery on 05/08/2012.  F/u in about 1 month with our PA

## 2012-07-28 NOTE — Patient Instructions (Signed)
Start with working out in the [place].

## 2012-08-04 DIAGNOSIS — I10 Essential (primary) hypertension: Secondary | ICD-10-CM | POA: Diagnosis not present

## 2012-08-04 DIAGNOSIS — I428 Other cardiomyopathies: Secondary | ICD-10-CM | POA: Diagnosis not present

## 2012-08-04 DIAGNOSIS — E669 Obesity, unspecified: Secondary | ICD-10-CM | POA: Diagnosis not present

## 2012-08-19 DIAGNOSIS — I10 Essential (primary) hypertension: Secondary | ICD-10-CM | POA: Diagnosis not present

## 2012-08-19 DIAGNOSIS — E669 Obesity, unspecified: Secondary | ICD-10-CM | POA: Diagnosis not present

## 2012-08-19 DIAGNOSIS — I428 Other cardiomyopathies: Secondary | ICD-10-CM | POA: Diagnosis not present

## 2012-08-26 ENCOUNTER — Telehealth: Payer: Self-pay

## 2012-08-26 ENCOUNTER — Encounter
Payer: Medicare Other | Attending: Physical Medicine and Rehabilitation | Admitting: Physical Medicine and Rehabilitation

## 2012-08-26 ENCOUNTER — Encounter: Payer: Self-pay | Admitting: Physical Medicine and Rehabilitation

## 2012-08-26 VITALS — BP 147/70 | HR 74 | Resp 14 | Ht 71.0 in | Wt 278.0 lb

## 2012-08-26 DIAGNOSIS — M722 Plantar fascial fibromatosis: Secondary | ICD-10-CM

## 2012-08-26 DIAGNOSIS — S8410XS Injury of peroneal nerve at lower leg level, unspecified leg, sequela: Secondary | ICD-10-CM

## 2012-08-26 DIAGNOSIS — Z5181 Encounter for therapeutic drug level monitoring: Secondary | ICD-10-CM | POA: Diagnosis not present

## 2012-08-26 DIAGNOSIS — M1732 Unilateral post-traumatic osteoarthritis, left knee: Secondary | ICD-10-CM

## 2012-08-26 DIAGNOSIS — Z79899 Other long term (current) drug therapy: Secondary | ICD-10-CM | POA: Insufficient documentation

## 2012-08-26 DIAGNOSIS — M175 Other unilateral secondary osteoarthritis of knee: Secondary | ICD-10-CM

## 2012-08-26 DIAGNOSIS — S8490XS Injury of unspecified nerve at lower leg level, unspecified leg, sequela: Secondary | ICD-10-CM | POA: Insufficient documentation

## 2012-08-26 DIAGNOSIS — S346XXS Injury of peripheral nerve(s) at abdomen, lower back and pelvis level, sequela: Secondary | ICD-10-CM | POA: Insufficient documentation

## 2012-08-26 DIAGNOSIS — X58XXXA Exposure to other specified factors, initial encounter: Secondary | ICD-10-CM | POA: Insufficient documentation

## 2012-08-26 MED ORDER — PREGABALIN 150 MG PO CAPS: 150.0000 mg | ORAL_CAPSULE | Freq: Three times a day (TID) | ORAL | Status: DC

## 2012-08-26 MED ORDER — OXYCODONE HCL 15 MG PO TABS: 15.0000 mg | ORAL_TABLET | Freq: Four times a day (QID) | ORAL | Status: DC | PRN

## 2012-08-26 NOTE — Patient Instructions (Signed)
Continue with your exercise program after you talked to your cardiologist and he approves.

## 2012-08-26 NOTE — Telephone Encounter (Signed)
Patient called requesting lyrica be sent to [facility].  [phone].  This was called in.  Patient aware.

## 2012-08-26 NOTE — Progress Notes (Signed)
Subjective:    Patient ID: Victor [name], male    DOB: 11/08/1960, 52 y.o.   MRN: [id number]  HPI [name] His knee pain is doing better overall, but his lower leg pain is still aggrevating. He feels that the lyrica has helped .  He's using 3 oxy's 15mg  a day currently.  He's trying to stay somewhat active at home, he has looked into the Silver sneakers program, and he has started to [place] with a Systems analyst. He has already lost 13lbs ! He states, that he is not following up with the wound center, his open wound on his left lower leg has healed.   Pain Inventory Average Pain 8 Pain Right Now 9 My pain is sharp, dull, tingling and aching  In the last 24 hours, has pain interfered with the following? General activity 8 Relation with others 7 Enjoyment of life 8 What TIME of day is your pain at its worst? varies Sleep (in general) Fair  Pain is worse with: some activites Pain improves with: pacing activities Relief from Meds: 6  Mobility walk without assistance how many minutes can you walk? 30 ability to climb steps?  yes do you drive?  yes Do you have any goals in this area?  yes  Function retired Do you have any goals in this area?  no  Neuro/Psych numbness  Prior Studies Any changes since last visit?  no  Physicians involved in your care Any changes since last visit?  no   Family History  Problem Relation Age of Onset  . Kidney disease Father    History   Social History  . Marital Status: Married    Spouse Name: N/A    Number of Children: N/A  . Years of Education: N/A   Social History Main Topics  . Smoking status: Never Smoker   . Smokeless tobacco: Never Used  . Alcohol Use: Yes  . Drug Use: None  . Sexually Active: None   Other Topics Concern  . None   Social History Narrative  . None   History reviewed. No pertinent past surgical history. Past Medical History   Diagnosis Date  . Closed fracture of tibia, upper end   . Lesion of lateral popliteal nerve   . Primary localized osteoarthrosis, lower leg   . Primary localized osteoarthrosis, upper arm   . Hypertension    BP 147/70  Pulse 74  Resp 14  Ht 5\' 11"  (1.803 m)  Wt 278 lb (126.1 kg)  BMI 38.79 kg/m2  SpO2 96%     Review of Systems  Neurological: Positive for numbness.  All other systems reviewed and are negative.       Objective:   Physical Exam Constitutional: He is oriented to person, place, and time. He appears well-developed and well-nourished.  HENT:  Head: Normocephalic and atraumatic.  Eyes: Conjunctivae and EOM are normal. Pupils are equal, round, and reactive to light.  Neck: Normal range of motion.  Cardiovascular: Normal rate and regular rhythm.  Pulmonary/Chest: Effort normal and breath sounds normal.  Abdominal: Soft. Bowel sounds are normal.  Musculoskeletal: left knee full extension, flexion 90-100 degrees, Left ankle extension 0/90 degrees, plantar flex 20 degrees. Tibialis anterior 3-/5, ext. Hall. Long. 0/5 Mild to moderate swelling around the left knee. Crepitus is persistent there. Deformities persistent aroound the left leg.  Neurological: He is alert and oriented to person, place, and time.  Chronic  changes in left leg. Persistent weakness and limtied rom in the left ankle.  Skin: Skin is warm. Small open 4 mm diameter wound on left tibia, no redness or increased temperature. Psychiatric: He has a normal mood and affect. His behavior is normal. Judgment and thought content normal.         Assessment & Plan:  1. Left lower extremity trauma with tibial plateau fracture and distal  femur fracture with multifactorial pain and arthritis at the joint.  2. Left Peroneal nerve injury.  PLAN:  1. Discussed management of edema, use of ice etc. Walking exercising in the pool. He will have long term difficulties with swelling based on his activity levels,  weight, etc.  2. Continue lyrica 150mg  t.i.d. for his neuropathic  pain. This has helped quite a bit. He may continue with meloxicam and use this prn.  3.Continue Oxycodone 15 mg 1 q.8 h. p.r.n., #90. He's generally using 3 a day at this point.He states, that he would like to increase his Oxycodone. Explained to patient , that all his medication works together, and that he should take his meloxicam and Lyrica as prescribed, before we would think about increasing his Oxycodone, I also explained that most of his pain is nerve pain, and therefore he should take his Lyrica as prescribed, and for his ankle joint pain he should also take his Mobic. I will talk to Dr. Riley Kill, if his pain does not get better with some warmer weather.  4. Left custom compression stocking/ leg sleeve is helpful.  5. He will need to explore sedentary work options if he hopes to return to the work force.  6. Advised patient to continue with his work out program in his gym, and in the pool, which is very beneficial to build up his strength, losing weight; he already lost 13 lbs ! Advised patient to check with his cardiologist whether he can continue his program, he felt a little dizzy after one workout and is seeing his cardiologist soon. He should not do his exercise program before his cardiologist approves. He is not following up with wound care anymore, his open wound on his left shin,has healed.  7. Catarrac surgery on 05/08/2012.  F/u in about 1 month with our PA

## 2012-08-28 ENCOUNTER — Ambulatory Visit (INDEPENDENT_AMBULATORY_CARE_PROVIDER_SITE_OTHER): Payer: Medicare Other | Admitting: Internal Medicine

## 2012-08-28 ENCOUNTER — Encounter: Payer: Self-pay | Admitting: Internal Medicine

## 2012-08-28 VITALS — BP 131/76 | HR 71 | Ht 73.0 in | Wt 277.0 lb

## 2012-08-28 DIAGNOSIS — I429 Cardiomyopathy, unspecified: Secondary | ICD-10-CM

## 2012-08-28 DIAGNOSIS — N62 Hypertrophy of breast: Secondary | ICD-10-CM | POA: Diagnosis not present

## 2012-08-28 DIAGNOSIS — I428 Other cardiomyopathies: Secondary | ICD-10-CM | POA: Diagnosis not present

## 2012-08-28 DIAGNOSIS — IMO0002 Reserved for concepts with insufficient information to code with codable children: Secondary | ICD-10-CM

## 2012-08-28 DIAGNOSIS — I5022 Chronic systolic (congestive) heart failure: Secondary | ICD-10-CM | POA: Insufficient documentation

## 2012-08-28 DIAGNOSIS — G478 Other sleep disorders: Secondary | ICD-10-CM

## 2012-08-28 DIAGNOSIS — G479 Sleep disorder, unspecified: Secondary | ICD-10-CM | POA: Insufficient documentation

## 2012-08-28 DIAGNOSIS — G4730 Sleep apnea, unspecified: Secondary | ICD-10-CM | POA: Insufficient documentation

## 2012-08-28 HISTORY — DX: Chronic systolic (congestive) heart failure: I50.22

## 2012-08-28 HISTORY — DX: Cardiomyopathy, unspecified: I42.9

## 2012-08-28 HISTORY — DX: Reserved for concepts with insufficient information to code with codable children: IMO0002

## 2012-08-28 NOTE — Assessment & Plan Note (Signed)
euvolemic but still with significant symptoms.  As above

## 2012-08-28 NOTE — Progress Notes (Signed)
ELECTROPHYSIOLOGY CONSULT NOTE  Patient ID: Victor [name], MRN: [id number], DOB/AGE: 1960-05-23 52 y.o. Admit date: (Not on file) Date of Consult: 08/28/2012  Primary Physician: Milus Height, PA-C Primary Cardiologist: JV   Chief Complaint: ?ICD    HPI Victor [name] is a 52 y.o. male with a greater than two-year history of a cardiomyopathy. He was treated initially with beta blockers and ACE inhibitor as it has for months now also been on Aldactone. Repeat echocardiogram earlier this month (4/14) demonstrated ejection fraction still in the 25% range with mild-moderate mitral regurgitation.  He has modest dyspnea on exertion which has been notable as he has been working with a  Psychologist, educational. He has had no edema. He does not have nocturnal dyspnea.he has had no syncope or tachycardia palpitations.  Cardiac evaluation has not included catheterization.  He snores and has daytime somnolence.  He is noted large breast tissue but is not enlarged that he is aware of since having started  Taking spironolactone     Past Medical History  Diagnosis Date  . Closed fracture of tibia, upper end   . Lesion of lateral popliteal nerve   . Primary localized osteoarthrosis, lower leg   . Primary localized osteoarthrosis, upper arm   . Hypertension       Surgical History:  Past Surgical History  Procedure Laterality Date  . Cataract extraction  06/2012  . Leg surgries      4 surgeries  . Retinal detachment surgery  2009     Home Meds: Prior to Admission medications   Medication Sig Start Date End Date Taking? Authorizing Provider  amitriptyline (ELAVIL) 50 MG tablet  03/11/12  Yes Historical Provider, MD  aspirin 325 MG tablet Take 325 mg by mouth daily.   Yes Historical Provider, MD  carvedilol (COREG) 12.5 MG tablet Take 12.5 mg by mouth 2 (two) times daily with a meal.   Yes Historical Provider, MD  Fluticasone-Salmeterol (ADVAIR) 250-50 MCG/DOSE AEPB Inhale 1 puff into the lungs 2  (two) times daily.   Yes Historical Provider, MD  furosemide (LASIX) 40 MG tablet Take 40 mg by mouth daily.   Yes Historical Provider, MD  levalbuterol [facility] HFA) 45 MCG/ACT inhaler Inhale into the lungs.   Yes Historical Provider, MD  lisinopril (PRINIVIL,ZESTRIL) 10 MG tablet Take 10 mg by mouth daily.   Yes Historical Provider, MD  meloxicam (MOBIC) 15 MG tablet Take 1 tablet (15 mg total) by mouth daily as needed for pain. 02/13/12  Yes Karen Prueter, PA-C  montelukast (SINGULAIR) 10 MG tablet Take 10 mg by mouth at bedtime.  03/12/12  Yes Historical Provider, MD  oxyCODONE (ROXICODONE) 15 MG immediate release tablet Take 1 tablet (15 mg total) by mouth every 6 (six) hours as needed. 08/26/12  Yes Clydie Braun Prueter, PA-C  pregabalin (LYRICA) 150 MG capsule Take 1 capsule (150 mg total) by mouth 3 (three) times daily. 08/26/12 08/26/13 Yes Karen Prueter, PA-C  sildenafil (VIAGRA) 50 MG tablet Take 1 tablet (50 mg total) by mouth daily as needed. 05/23/12  Yes Ranelle Oyster, MD  simvastatin (ZOCOR) 20 MG tablet Take 20 mg by mouth daily.   Yes Historical Provider, MD  spironolactone (ALDACTONE) 25 MG tablet Take 1 tablet by mouth Daily. 11/21/11  Yes Historical Provider, MD     Allergies: No Known Allergies  History   Social History  . Marital Status: Married    Spouse Name: N/A    Number of Children: N/A  . Years of Education:  N/A   Occupational History  . Not on file.   Social History Main Topics  . Smoking status: Never Smoker   . Smokeless tobacco: Never Used  . Alcohol Use: Yes     Comment: occasional  . Drug Use: No  . Sexually Active: Not on file   Other Topics Concern  . Not on file   Social History Narrative  . No narrative on file     Family History  Problem Relation Age of Onset  . Kidney disease Father      ROS:  Please see the history of present illness.   2  All other systems reviewed and negative.    Physical Exam:   Blood pressure 131/76, pulse 71,  height 6\' 1"  (1.854 m), weight 277 lb (125.646 kg), SpO2 98.00%. General: Well developed, well nourished age appearing obese African American male in no acute distress. Head: Normocephalic, atraumatic, sclera non-icteric, no xanthomas, nares are without discharge. EENT: normal Lymph Nodes:  none Back: without scoliosis/kyphosis *, no CVA tendersness Neck: Negative for carotid bruits. JVD not elevated. Breast enlargement. Lungs: Clear bilaterally to auscultation without wheezes, rales, or rhonchi. Breathing is unlabored. Heart: RRR with S1 S2. No murmur , rubs, or gallops appreciated. Abdomen: Soft, non-tender, non-distended with normoactive bowel sounds. No hepatomegaly. No rebound/guarding. No obvious abdominal masses. Msk:  Strength and tone appear normal for age. Extremities: No clubbing or cyanosis. No edema.  Distal pedal pulses are 2+ and equal bilaterally. Skin: Warm and Dry Neuro: Alert and oriented X 3. CN III-XII intact Grossly normal sensory and motor function . Psych:  Responds to questions appropriately with a normal affect.      Labs:   Radiology/Studies:  No results found.  EKG:  Sinus rhythm at 71 Intervals 18/12/40 Axis is left at -65  Assessment and Plan:    Victor [name]

## 2012-08-28 NOTE — Assessment & Plan Note (Signed)
He has significant snoring and daytime somnolence. His body habitus is certainly consistent and worrisome for sleep apnea as is his long-standing hypertension. He can aggravate cardiomyopathy. He needs a sleep study.

## 2012-08-28 NOTE — Assessment & Plan Note (Signed)
Have asked that he followup with his wife. He could be a candidate for a eplerenone

## 2012-08-28 NOTE — Assessment & Plan Note (Signed)
The patient has persistent cardiomyopathy. There are 2 issues in my mind. The first is a reversible component potentially related to ischemia or aggravating components related to sleep apnea should be addressed the second is as an [place] American she be on hydralazine/nitrates and if so should this be in addition or exchange for his Aldactone. As relates to the former I will be in touch with Dr. Hedy Jacob as relates to the latter I will ask Dr. Dorthea Cove.  Once the aforementioned issues are resolved it would be appropriate to consider ICD implantation for primary prevention. We have reviewed both S. ICD as well as endovascular ICD. We discussed the fact that 85% of patients would be appropriate for S. ICD based on T wave screening.

## 2012-09-01 ENCOUNTER — Other Ambulatory Visit: Payer: Self-pay | Admitting: Interventional Cardiology

## 2012-09-08 DIAGNOSIS — I10 Essential (primary) hypertension: Secondary | ICD-10-CM | POA: Diagnosis not present

## 2012-09-08 DIAGNOSIS — I428 Other cardiomyopathies: Secondary | ICD-10-CM | POA: Diagnosis not present

## 2012-09-08 DIAGNOSIS — R609 Edema, unspecified: Secondary | ICD-10-CM | POA: Diagnosis not present

## 2012-09-09 ENCOUNTER — Other Ambulatory Visit: Payer: Self-pay | Admitting: Cardiology

## 2012-09-09 DIAGNOSIS — Z6838 Body mass index (BMI) 38.0-38.9, adult: Secondary | ICD-10-CM | POA: Diagnosis not present

## 2012-09-09 DIAGNOSIS — E669 Obesity, unspecified: Secondary | ICD-10-CM | POA: Diagnosis not present

## 2012-09-09 DIAGNOSIS — J45909 Unspecified asthma, uncomplicated: Secondary | ICD-10-CM | POA: Diagnosis not present
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
## 2012-09-10 ENCOUNTER — Inpatient Hospital Stay (HOSPITAL_BASED_OUTPATIENT_CLINIC_OR_DEPARTMENT_OTHER)
Admission: RE | Admit: 2012-09-10 | Discharge: 2012-09-10 | Disposition: A | Discharge status: Home / Self Care | Payer: Medicare Other | Source: Ambulatory Visit | Attending: Interventional Cardiology | Admitting: Interventional Cardiology

## 2012-09-10 ENCOUNTER — Encounter (HOSPITAL_BASED_OUTPATIENT_CLINIC_OR_DEPARTMENT_OTHER)
Admission: RE | Disposition: A | Discharge status: Home / Self Care | Payer: Self-pay | Source: Ambulatory Visit | Attending: Interventional Cardiology

## 2012-09-10 ENCOUNTER — Encounter (HOSPITAL_BASED_OUTPATIENT_CLINIC_OR_DEPARTMENT_OTHER): Payer: Self-pay | Admitting: *Deleted

## 2012-09-10 DIAGNOSIS — I428 Other cardiomyopathies: Secondary | ICD-10-CM | POA: Diagnosis not present

## 2012-09-10 DIAGNOSIS — I429 Cardiomyopathy, unspecified: Secondary | ICD-10-CM

## 2012-09-10 LAB — POCT I-STAT 3, ART BLOOD GAS (G3+)
Acid-base deficit: 2 mmol/L (ref 0.0–2.0)
Bicarbonate: 23.1 mEq/L (ref 20.0–24.0)
pCO2 arterial: 39.4 mmHg (ref 35.0–45.0)
pO2, Arterial: 65 mmHg — ABNORMAL LOW (ref 80.0–100.0)

## 2012-09-10 SURGERY — JV LEFT AND RIGHT HEART CATHETERIZATION WITH CORONARY ANGIOGRAM
Anesthesia: Moderate Sedation

## 2012-09-10 MED ORDER — SODIUM CHLORIDE 0.9 % IV SOLN: 1.0000 mL/kg/h | INTRAVENOUS | Status: AC

## 2012-09-10 MED ORDER — ONDANSETRON HCL 4 MG/2ML IJ SOLN: 4.0000 mg | Freq: Four times a day (QID) | INTRAMUSCULAR | Status: DC | PRN

## 2012-09-10 MED ORDER — ASPIRIN 81 MG PO CHEW: 324.0000 mg | CHEWABLE_TABLET | ORAL | Status: DC

## 2012-09-10 MED ORDER — SODIUM CHLORIDE 0.9 % IJ SOLN: 3.0000 mL | INTRAMUSCULAR | Status: DC | PRN

## 2012-09-10 MED ORDER — SODIUM CHLORIDE 0.9 % IJ SOLN: 3.0000 mL | Freq: Two times a day (BID) | INTRAMUSCULAR | Status: DC

## 2012-09-10 MED ORDER — DIAZEPAM 5 MG PO TABS
5.0000 mg | ORAL_TABLET | ORAL | Status: AC
  Administered 2012-09-10: 5 mg via ORAL

## 2012-09-10 MED ORDER — ACETAMINOPHEN 325 MG PO TABS: 650.0000 mg | ORAL_TABLET | ORAL | Status: DC | PRN

## 2012-09-10 MED ORDER — MORPHINE SULFATE 2 MG/ML IJ SOLN: 1.0000 mg | INTRAMUSCULAR | Status: DC | PRN

## 2012-09-10 MED ORDER — SODIUM CHLORIDE 0.9 % IV SOLN: 250.0000 mL | INTRAVENOUS | Status: DC | PRN

## 2012-09-10 MED ORDER — SODIUM CHLORIDE 0.9 % IV SOLN
INTRAVENOUS | Status: DC
  Administered 2012-09-10: 08:00:00 via INTRAVENOUS

## 2012-09-10 NOTE — Progress Notes (Signed)
Pt voided 500cc of clear yellow urine.  Tolerated well

## 2012-09-10 NOTE — CV Procedure (Signed)
PROCEDURE:  Left heart catheterization with selective coronary angiography, left ventriculogram.  INDICATIONS:  Cardiomyopathy  The risks, benefits, and details of the procedure were explained to the patient.  The patient verbalized understanding and wanted to proceed.  Informed written consent was obtained.  PROCEDURE TECHNIQUE:  After Xylocaine anesthesia a 7 Fr sheath was placed in the right femoral vein and a 26F sheath was placed in the right femoral artery with a single anterior needle wall stick.  Swan catheter was advanced but IVC filter was noted so the RHC was aborted.  Left coronary angiography was done using a Judkins L4 guide catheter.  Right coronary angiography was done using a Judkins R4 guide catheter.  Left ventriculography was done using a pigtail catheter.    CONTRAST:  Total of 95 cc.  COMPLICATIONS:  None.    HEMODYNAMICS:  Aortic pressure was 105/65; LV pressure was 91/4; LVEDP 9.  There was no gradient between the left ventricle and aorta.    ANGIOGRAPHIC DATA:   The left main coronary artery is angiographically normal.  The left anterior descending artery is a large vessel which appears angiographically normal.  There are several diagonal vessels which are all widely patent.  The left circumflex artery is a large vessel with a few small obtuse marginals, and are all angiographically normal.  The right coronary artery is a large dominant vessel which is angiographically normal.  The PDA is large.  The PLA is small and patent.  LEFT VENTRICULOGRAM:  Left ventricular angiogram was done in the 30 RAO projection and revealed normal left ventricular wall motion and systolic function with an estimated ejection fraction of 25%.  LVEDP was 9 mmHg.  ABDOMINAL AORTOGRAM: No AAA.  Bilateral single renal arteries which are widely patent.  IMPRESSIONS:  1. Normal left main coronary artery. 2. Normal left anterior descending artery and its branches. 3. Normal left circumflex  artery and its branches. 4. Normal right coronary artery. 5. Globally reduced left ventricular systolic function.  LVEDP 9 mmHg.  Ejection fraction 25%. 6.  IVC filter in place.    RECOMMENDATION:  Continue medical therapy for nonischemic cardiomyopathy.  Will discuss with Dr. Graciela Husbands regarding possible AICD.

## 2012-09-10 NOTE — Progress Notes (Signed)
Pt return from cath procedure alert and oriented and able to tolerate fluids.  Dr Catalina Gravel in to talk to pt and family about the results of test and plan of care

## 2012-09-10 NOTE — H&P (Signed)
  Date of Initial H&P: 09/08/12  History reviewed, patient examined, no change in status, stable for surgery.Procedure explained. All Questions answered.

## 2012-09-22 ENCOUNTER — Encounter: Payer: Self-pay | Admitting: Physical Medicine and Rehabilitation

## 2012-09-22 ENCOUNTER — Encounter
Payer: Medicare Other | Attending: Physical Medicine and Rehabilitation | Admitting: Physical Medicine and Rehabilitation

## 2012-09-22 VITALS — BP 131/71 | HR 84 | Resp 14 | Ht 73.0 in | Wt 279.0 lb

## 2012-09-22 DIAGNOSIS — M1710 Unilateral primary osteoarthritis, unspecified knee: Secondary | ICD-10-CM | POA: Diagnosis not present

## 2012-09-22 DIAGNOSIS — S346XXS Injury of peripheral nerve(s) at abdomen, lower back and pelvis level, sequela: Secondary | ICD-10-CM | POA: Diagnosis not present

## 2012-09-22 DIAGNOSIS — M79609 Pain in unspecified limb: Secondary | ICD-10-CM | POA: Insufficient documentation

## 2012-09-22 DIAGNOSIS — M1732 Unilateral post-traumatic osteoarthritis, left knee: Secondary | ICD-10-CM

## 2012-09-22 DIAGNOSIS — M25569 Pain in unspecified knee: Secondary | ICD-10-CM | POA: Diagnosis not present

## 2012-09-22 DIAGNOSIS — I10 Essential (primary) hypertension: Secondary | ICD-10-CM | POA: Diagnosis not present

## 2012-09-22 DIAGNOSIS — X58XXXS Exposure to other specified factors, sequela: Secondary | ICD-10-CM | POA: Insufficient documentation

## 2012-09-22 DIAGNOSIS — M175 Other unilateral secondary osteoarthritis of knee: Secondary | ICD-10-CM | POA: Diagnosis not present

## 2012-09-22 DIAGNOSIS — G8929 Other chronic pain: Secondary | ICD-10-CM | POA: Diagnosis not present

## 2012-09-22 DIAGNOSIS — S8290XS Unspecified fracture of unspecified lower leg, sequela: Secondary | ICD-10-CM | POA: Insufficient documentation

## 2012-09-22 DIAGNOSIS — S8490XS Injury of unspecified nerve at lower leg level, unspecified leg, sequela: Secondary | ICD-10-CM | POA: Insufficient documentation

## 2012-09-22 MED ORDER — OXYCODONE HCL 15 MG PO TABS: 15.0000 mg | ORAL_TABLET | Freq: Four times a day (QID) | ORAL | Status: DC | PRN
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
## 2012-09-22 NOTE — Patient Instructions (Addendum)
Stay as active as tolerated, and as approved by your cardiologist. Continue with your walking as tolerated.

## 2012-09-24 ENCOUNTER — Encounter: Payer: Medicare Other | Admitting: Internal Medicine

## 2012-09-26 DIAGNOSIS — H2000 Unspecified acute and subacute iridocyclitis: Secondary | ICD-10-CM | POA: Diagnosis not present

## 2012-09-26 DIAGNOSIS — H52209 Unspecified astigmatism, unspecified eye: Secondary | ICD-10-CM | POA: Diagnosis not present

## 2012-09-26 DIAGNOSIS — Z961 Presence of intraocular lens: Secondary | ICD-10-CM | POA: Diagnosis not present

## 2012-09-26 DIAGNOSIS — H4050X0 Glaucoma secondary to other eye disorders, unspecified eye, stage unspecified: Secondary | ICD-10-CM | POA: Diagnosis not present

## 2012-10-02 ENCOUNTER — Encounter: Payer: Self-pay | Admitting: Internal Medicine

## 2012-10-02 ENCOUNTER — Telehealth: Payer: Self-pay | Admitting: Internal Medicine

## 2012-10-02 ENCOUNTER — Ambulatory Visit (INDEPENDENT_AMBULATORY_CARE_PROVIDER_SITE_OTHER): Payer: Medicare Other | Admitting: Internal Medicine

## 2012-10-02 VITALS — BP 120/88 | HR 72 | Ht 73.0 in | Wt 272.8 lb

## 2012-10-02 DIAGNOSIS — I429 Cardiomyopathy, unspecified: Secondary | ICD-10-CM

## 2012-10-02 DIAGNOSIS — I428 Other cardiomyopathies: Secondary | ICD-10-CM

## 2012-10-02 DIAGNOSIS — I5022 Chronic systolic (congestive) heart failure: Secondary | ICD-10-CM | POA: Diagnosis not present

## 2012-10-02 DIAGNOSIS — G4730 Sleep apnea, unspecified: Secondary | ICD-10-CM

## 2012-10-02 DIAGNOSIS — G478 Other sleep disorders: Secondary | ICD-10-CM

## 2012-10-02 MED ORDER — ISOSORB DINITRATE-HYDRALAZINE 20-37.5 MG PO TABS: ORAL_TABLET | ORAL | Status: DC

## 2012-10-02 NOTE — Telephone Encounter (Signed)
I'm having problems reaching this patient by phone, I will mail his appointment to him.

## 2012-10-02 NOTE — Telephone Encounter (Signed)
New problem     Patient was seen today    Stating someone called him today returning call back.

## 2012-10-02 NOTE — Patient Instructions (Signed)
Your physician has recommended you make the following change in your medication:  1) Start Bi-Dil 20/37.5 mg one tablet by mouth twice daily.  Your physician has recommended that you have a cardiopulmonary stress test (CPX). CPX testing is a non-invasive measurement of heart and lung function. It replaces a traditional treadmill stress test. This type of test provides a tremendous amount of information that relates not only to your present condition but also for future outcomes. This test combines measurements of you ventilation, respiratory gas exchange in the lungs, electrocardiogram (EKG), blood pressure and physical response before, during, and following an exercise protocol.  Your physician recommends that you schedule a follow-up appointment : with Dr. Eldridge Dace after you CPX test is done.

## 2012-10-02 NOTE — Assessment & Plan Note (Signed)
Currently describes minimal symptoms. As above

## 2012-10-02 NOTE — Assessment & Plan Note (Signed)
He is working on losing weight. His wife says, according to him, that he is snoring less

## 2012-10-02 NOTE — Progress Notes (Signed)
Patient Care Team: Milus Height, PA-C as PCP - General (Nurse Practitioner)   HPI  Victor [name] is a 52 y.o. male Seen for ICD consideration for persistent cardiomyopathy.  It was ;presumed nonischemic and he underwent cath 5/14 demonstrating patent arteries.   He has no significant symptoms at this point. He is interested in deferring ICD implantation as he is feeling grate  Past Medical History  Diagnosis Date  . Closed fracture of tibia, upper end   . Lesion of lateral popliteal nerve   . Primary localized osteoarthrosis, lower leg   . Primary localized osteoarthrosis, upper arm   . Hypertension   . Breast enlargement 08/28/2012  . Cardiomyopathy- nonischemic 08/28/2012    CATH 5/14 Normal CA  EF 30%  . Chronic systolic heart failure 08/28/2012    Past Surgical History  Procedure Laterality Date  . Cataract extraction  06/2012  . Leg surgries      4 surgeries  . Retinal detachment surgery  2009    Current Outpatient Prescriptions  Medication Sig Dispense Refill  . aspirin 325 MG tablet Take 325 mg by mouth daily.      . brimonidine (ALPHAGAN) 0.15 % ophthalmic solution       . carvedilol (COREG) 12.5 MG tablet Take 12.5 mg by mouth 2 (two) times daily with a meal.      . Fluticasone-Salmeterol (ADVAIR) 250-50 MCG/DOSE AEPB Inhale 1 puff into the lungs 2 (two) times daily.      . furosemide (LASIX) 40 MG tablet Take 40 mg by mouth daily.      Marland Kitchen levalbuterol (XOPENEX HFA) 45 MCG/ACT inhaler Inhale into the lungs.      Marland Kitchen lisinopril (PRINIVIL,ZESTRIL) 10 MG tablet Take 10 mg by mouth daily.      . meloxicam (MOBIC) 15 MG tablet 15 mg daily.       . montelukast (SINGULAIR) 10 MG tablet Take 10 mg by mouth at bedtime.       Marland Kitchen oxyCODONE (ROXICODONE) 15 MG immediate release tablet Take 1 tablet (15 mg total) by mouth every 6 (six) hours as needed.  90 tablet  0  . pregabalin (LYRICA) 150 MG capsule Take 1 capsule (150 mg total) by mouth 3 (three) times daily.  270 capsule  1   . sildenafil (VIAGRA) 50 MG tablet Take 1 tablet (50 mg total) by mouth daily as needed.  22 tablet  3  . simvastatin (ZOCOR) 20 MG tablet Take 20 mg by mouth daily.      Marland Kitchen spironolactone (ALDACTONE) 25 MG tablet Take 1 tablet by mouth Daily.       No current facility-administered medications for this visit.    No Known Allergies  Review of Systems negative except from HPI and PMH  Physical Exam BP 120/88  Pulse 72  Ht 6\' 1"  (1.854 m)  Wt 272 lb 12.8 oz (123.741 kg)  BMI 36 kg/m2 Well developed and well nourished in no acute distress HENT normal E scleral and icterus clear Neck Supple JVP flat; carotids brisk and full Clear to ausculation  Regular rate and rhythm, no murmurs gallops or rub Soft with active bowel sounds No clubbing cyanosis none Edema Alert and oriented, grossly normal motor and sensory function Skin Warm and Dry    Assessment and  Plan

## 2012-10-02 NOTE — Assessment & Plan Note (Signed)
The patient has nonischemic cardiomyopathy and currently has minimal symptoms. As an indication for ICD he would need to have class II symptoms. Cardiopulmonary stress testing will allow of objectively measure his functional status. We did discuss the potential patient's modify behavior and limit activities out of anticipation of symptoms. He would like to pursue this course.  In addition, we will begin him on BiDil. In the event that he has class II symptoms or greater, reassessment of LV function 3 months would be appropriate prior to proceeding with ICD implantation

## 2012-10-09 DIAGNOSIS — H2000 Unspecified acute and subacute iridocyclitis: Secondary | ICD-10-CM | POA: Diagnosis not present

## 2012-10-09 DIAGNOSIS — H4050X0 Glaucoma secondary to other eye disorders, unspecified eye, stage unspecified: Secondary | ICD-10-CM | POA: Diagnosis not present

## 2012-10-15 ENCOUNTER — Ambulatory Visit (HOSPITAL_COMMUNITY): Payer: Medicare Other

## 2012-10-17 ENCOUNTER — Encounter
Payer: Medicare Other | Attending: Physical Medicine and Rehabilitation | Admitting: Physical Medicine and Rehabilitation

## 2012-10-17 ENCOUNTER — Encounter: Payer: Self-pay | Admitting: Physical Medicine and Rehabilitation

## 2012-10-17 VITALS — BP 160/76 | HR 85 | Resp 16 | Ht 73.0 in | Wt 273.0 lb

## 2012-10-17 DIAGNOSIS — S8290XS Unspecified fracture of unspecified lower leg, sequela: Secondary | ICD-10-CM | POA: Diagnosis not present

## 2012-10-17 DIAGNOSIS — M79609 Pain in unspecified limb: Secondary | ICD-10-CM | POA: Insufficient documentation

## 2012-10-17 DIAGNOSIS — S346XXS Injury of peripheral nerve(s) at abdomen, lower back and pelvis level, sequela: Secondary | ICD-10-CM | POA: Diagnosis not present

## 2012-10-17 DIAGNOSIS — I10 Essential (primary) hypertension: Secondary | ICD-10-CM | POA: Insufficient documentation

## 2012-10-17 DIAGNOSIS — S8490XS Injury of unspecified nerve at lower leg level, unspecified leg, sequela: Secondary | ICD-10-CM | POA: Insufficient documentation

## 2012-10-17 DIAGNOSIS — M1710 Unilateral primary osteoarthritis, unspecified knee: Secondary | ICD-10-CM | POA: Diagnosis not present

## 2012-10-17 DIAGNOSIS — M25569 Pain in unspecified knee: Secondary | ICD-10-CM | POA: Insufficient documentation

## 2012-10-17 DIAGNOSIS — X58XXXS Exposure to other specified factors, sequela: Secondary | ICD-10-CM | POA: Insufficient documentation

## 2012-10-17 DIAGNOSIS — M175 Other unilateral secondary osteoarthritis of knee: Secondary | ICD-10-CM | POA: Diagnosis not present

## 2012-10-17 DIAGNOSIS — G8929 Other chronic pain: Secondary | ICD-10-CM | POA: Diagnosis not present

## 2012-10-17 DIAGNOSIS — M1732 Unilateral post-traumatic osteoarthritis, left knee: Secondary | ICD-10-CM

## 2012-10-17 MED ORDER — OXYCODONE HCL 15 MG PO TABS: 15.0000 mg | ORAL_TABLET | Freq: Four times a day (QID) | ORAL | Status: DC | PRN

## 2012-10-17 MED ORDER — DICLOFENAC SODIUM 1 % TD GEL: 2.0000 g | Freq: Four times a day (QID) | TRANSDERMAL | Status: DC

## 2012-10-17 NOTE — Progress Notes (Signed)
Subjective:    Patient ID: Victor [name], male    DOB: 15-Dec-1960, 52 y.o.   MRN: [id number]  HPI [name] His knee pain is doing better overall, but his lower leg pain is still aggrevating. He feels that the lyrica has helped . He's using 3 oxy's 15mg  a day currently.  He's trying to stay somewhat active at home, he has looked into the [employer] sne[employer] program, and [place] [facility] [place] [facility] [place] [employer] [place] [employer]. He has already lost 20 lbs ! He states, that he is not following up with the wound center, his open wound on his left lower leg has unfortunately opened up again a little , he states, that he will go back to the wound center if it does not close up again.     Pain Inventory Average Pain 8 Pain Right Now 8 My pain is sharp, dull and aching  In the last 24 hours, has pain interfered with the following? General activity 8 Relation with others 8 Enjoyment of life 8 What TIME of day is your pain at its worst? morning and night Sleep (in general) Fair  Pain is worse with: walking and standing Pain improves with: rest, therapy/exercise and medication Relief from Meds: 6  Mobility walk without assistance ability to climb steps?  yes do you drive?  yes Do you have any goals in this area?  no  Function disabled: date disabled .  Neuro/Psych No problems in this area  Prior Studies Any changes since last visit?  no  Physicians involved in your care Any changes since last visit?  no   Family History  Problem Relation Age of Onset  . Kidney disease Father    History   Social History  . Marital Status: Married    Spouse Name: N/A    Number of Children: N/A  . Years of Education: N/A   Social History Main Topics  . Smoking status: Never Smoker   . Smokeless tobacco: Never Used  . Alcohol Use: Yes     Comment: occasional  . Drug Use: No  . Sexually Active: None   Other Topics Concern  . None    Social History Narrative  . None   Past Surgical History  Procedure Laterality Date  . Cataract extraction  06/2012  . Leg surgries      4 surgeries  . Retinal detachment surgery  2009   Past Medical History  Diagnosis Date  . Closed fracture of tibia, upper end   . Lesion of lateral popliteal nerve   . Primary localized osteoarthrosis, lower leg   . Primary localized osteoarthrosis, upper arm   . Hypertension   . Breast enlargement 08/28/2012  . Cardiomyopathy- nonischemic 08/28/2012    CATH 5/14 Normal CA  EF 30%  . Chronic systolic heart failure 08/28/2012   BP 160/76  Pulse 85  Resp 16  Ht 6\' 1"  (1.854 m)  Wt 273 lb (123.832 kg)  BMI 36.03 kg/m2  SpO2 94%     Review of Systems  All other systems reviewed and are negative.       Objective:   Physical Exam Constitutional: He is oriented to person, place, and time. He appears well-developed and well-nourished.  HENT:  Head: Normocephalic and atraumatic.  Eyes: Conjunctivae and EOM are normal.  Neck: Normal range of motion.   Musculoskeletal: left knee full extension, flexion 90-100 degrees, Left ankle extension  0/90 degrees, plantar flex 20 degrees. Tibialis anterior 3-/5, ext. Hall. Long. 0/5 Mild to moderate swelling around the left knee. Crepitus is persistent there. Deformities persistent aroound the left leg.  Neurological: He is alert and oriented to person, place, and time.  Chronic changes in left leg. Persistent weakness and limtied rom in the left ankle.  Skin: Skin is warm. Small open 4 mm diameter wound on left tibia, no redness or increased temperature. Psychiatric: He has a normal mood and affect. His behavior is normal. Judgment and thought content normal.        Assessment & Plan:  1. Left lower extremity trauma with tibial plateau fracture and distal  femur fracture with multifactorial pain and arthritis at the joint.  2. Left Peroneal nerve injury.  PLAN:  1. Discussed management of  edema, use of ice etc. Walking exercising in the pool. He will have long term difficulties with swelling based on his activity levels, weight, etc.  2. Continue lyrica 150mg  t.i.d. for his neuropathic  pain. This has helped quite a bit. He may continue with meloxicam and use this prn.  3.Continue Oxycodone 15 mg 1 q.8 h. p.r.n., #90. He's generally using 3 a day at this point.He states, that he would like to increase his Oxycodone. Explained to patient , that all his medication works together, and that he should take his meloxicam and Lyrica as prescribed, before we would think about increasing his Oxycodone, I also explained that most of his pain is nerve pain, and therefore he should take his Lyrica as prescribed, and for his ankle joint pain he should also take his Mobic. 4. Left custom compression stocking/ leg sleeve is helpful.  5. He will need to explore sedentary work options if he hopes to return to the work force.  6. Advised patient to continue with his work out program in his gym, and in the pool, which is very beneficial to build up his strength, losing weight; he already lost 20 lbs ! Advised patient to check with his cardiologist whether he can continue his program, he felt a little dizzy after one workout and is still following up with his cardiologist . He should not do his exercise program before his cardiologist approves.   7. Catarrac surgery on 05/08/2012.  F/u in about 1 month with our PA

## 2012-10-17 NOTE — Patient Instructions (Signed)
Continue with your exercise program, as pain permits 

## 2012-10-20 ENCOUNTER — Ambulatory Visit: Payer: Medicare Other | Admitting: Physical Medicine and Rehabilitation

## 2012-10-28 DIAGNOSIS — L408 Other psoriasis: Secondary | ICD-10-CM | POA: Diagnosis not present

## 2012-10-28 DIAGNOSIS — L909 Atrophic disorder of skin, unspecified: Secondary | ICD-10-CM | POA: Diagnosis not present

## 2012-10-28 DIAGNOSIS — L919 Hypertrophic disorder of the skin, unspecified: Secondary | ICD-10-CM | POA: Diagnosis not present

## 2012-10-29 ENCOUNTER — Encounter (HOSPITAL_COMMUNITY): Payer: Medicare Other

## 2012-10-30 DIAGNOSIS — H2000 Unspecified acute and subacute iridocyclitis: Secondary | ICD-10-CM | POA: Diagnosis not present

## 2012-11-10 ENCOUNTER — Telehealth: Payer: Self-pay

## 2012-11-10 MED ORDER — SILDENAFIL CITRATE 50 MG PO TABS: 50.0000 mg | ORAL_TABLET | Freq: Every day | ORAL | Status: DC | PRN

## 2012-11-10 NOTE — Telephone Encounter (Signed)
Patient called requesting viagra refill.  Script electronically sent to pharmacy.

## 2012-11-17 ENCOUNTER — Encounter
Payer: Medicare Other | Attending: Physical Medicine and Rehabilitation | Admitting: Physical Medicine and Rehabilitation

## 2012-11-17 ENCOUNTER — Encounter: Payer: Self-pay | Admitting: Physical Medicine and Rehabilitation

## 2012-11-17 VITALS — BP 124/74 | HR 84 | Resp 16 | Ht 73.0 in | Wt 266.0 lb

## 2012-11-17 DIAGNOSIS — G8921 Chronic pain due to trauma: Secondary | ICD-10-CM | POA: Diagnosis not present

## 2012-11-17 DIAGNOSIS — X58XXXA Exposure to other specified factors, initial encounter: Secondary | ICD-10-CM | POA: Insufficient documentation

## 2012-11-17 DIAGNOSIS — M1710 Unilateral primary osteoarthritis, unspecified knee: Secondary | ICD-10-CM | POA: Insufficient documentation

## 2012-11-17 DIAGNOSIS — M1732 Unilateral post-traumatic osteoarthritis, left knee: Secondary | ICD-10-CM

## 2012-11-17 DIAGNOSIS — M175 Other unilateral secondary osteoarthritis of knee: Secondary | ICD-10-CM | POA: Diagnosis not present

## 2012-11-17 DIAGNOSIS — I428 Other cardiomyopathies: Secondary | ICD-10-CM | POA: Insufficient documentation

## 2012-11-17 DIAGNOSIS — S346XXS Injury of peripheral nerve(s) at abdomen, lower back and pelvis level, sequela: Secondary | ICD-10-CM | POA: Insufficient documentation

## 2012-11-17 DIAGNOSIS — M25569 Pain in unspecified knee: Secondary | ICD-10-CM | POA: Diagnosis not present

## 2012-11-17 DIAGNOSIS — S8290XS Unspecified fracture of unspecified lower leg, sequela: Secondary | ICD-10-CM | POA: Insufficient documentation

## 2012-11-17 DIAGNOSIS — S81009A Unspecified open wound, unspecified knee, initial encounter: Secondary | ICD-10-CM | POA: Insufficient documentation

## 2012-11-17 DIAGNOSIS — S91009A Unspecified open wound, unspecified ankle, initial encounter: Secondary | ICD-10-CM | POA: Insufficient documentation

## 2012-11-17 DIAGNOSIS — S8412XS Injury of peroneal nerve at lower leg level, left leg, sequela: Secondary | ICD-10-CM

## 2012-11-17 DIAGNOSIS — M79609 Pain in unspecified limb: Secondary | ICD-10-CM | POA: Insufficient documentation

## 2012-11-17 DIAGNOSIS — S8490XS Injury of unspecified nerve at lower leg level, unspecified leg, sequela: Secondary | ICD-10-CM | POA: Insufficient documentation

## 2012-11-17 DIAGNOSIS — X58XXXS Exposure to other specified factors, sequela: Secondary | ICD-10-CM | POA: Insufficient documentation

## 2012-11-17 MED ORDER — OXYCODONE HCL 15 MG PO TABS: 15.0000 mg | ORAL_TABLET | Freq: Four times a day (QID) | ORAL | Status: DC | PRN

## 2012-11-17 NOTE — Progress Notes (Signed)
Subjective:    Patient ID: Victor [name], male    DOB: 03-04-1961, 52 y.o.   MRN: [id number]  HPI Victor [name] is back regarding his chronic left knee and leg pain. His knee pain is doing better overall, but his lower leg pain is still aggrevating. He feels that the lyrica has helped . He's using 3 oxy's 15mg  a day currently.  He's trying to stay somewhat active at home, he has looked into the [employer] sne[employer] program, and [facility] [place] [facility] [place] [facility] [employer] [facility] [employer] [facility]. He has already lost 27 lbs ! He states, that he is not following up with the wound center, his open wound on his left lower leg has unfortunately opened up again a little , he states, that he will go back to the wound center if it does not close up again.   Pain Inventory Average Pain 8 Pain Right Now 8 My pain is sharp, dull and tingling  In the last 24 hours, has pain interfered with the following? General activity 8 Relation with others 8 Enjoyment of life 8 What TIME of day is your pain at its worst? na Sleep (in general) NA  Pain is worse with: walking and standing Pain improves with: rest, therapy/exercise, pacing activities and medication Relief from Meds: na  Mobility walk without assistance how many minutes can you walk? 30 Do you have any goals in this area?  no  Function disabled: date disabled 3-07 retired Do you have any goals in this area?  no  Neuro/Psych numbness tingling  Prior Studies Any changes since last visit?  no  Physicians involved in your care Any changes since last visit?  no   Family History  Problem Relation Age of Onset  . Kidney disease Father    History   Social History  . Marital Status: Married    Spouse Name: N/A    Number of Children: N/A  . Years of Education: N/A   Social History Main Topics  . Smoking status: Never Smoker   . Smokeless tobacco: Never Used  . Alcohol Use: Yes     Comment: occasional  . Drug Use: No  . Sexually Active: None    Other Topics Concern  . None   Social History Narrative  . None   Past Surgical History  Procedure Laterality Date  . Cataract extraction  06/2012  . Leg surgries      4 surgeries  . Retinal detachment surgery  2009   Past Medical History  Diagnosis Date  . Closed fracture of tibia, upper end   . Lesion of lateral popliteal nerve   . Primary localized osteoarthrosis, lower leg   . Primary localized osteoarthrosis, upper arm   . Hypertension   . Breast enlargement 08/28/2012  . Cardiomyopathy- nonischemic 08/28/2012    CATH 5/14 Normal CA  EF 30%  . Chronic systolic heart failure 08/28/2012   BP 124/74  Pulse 84  Resp 16  Ht 6\' 1"  (1.854 m)  Wt 266 lb (120.657 kg)  BMI 35.1 kg/m2  SpO2 95%     Review of Systems  Neurological: Positive for numbness.       Tingling   All other systems reviewed and are negative.       Objective:   Physical Exam  Constitutional: He is oriented to person, place, and time. He appears well-developed and well-nourished.  HENT:  Head: Normocephalic and atraumatic.  Eyes: Conjunctivae and EOM are normal.  Neck: Normal  range of motion.   Musculoskeletal: left knee full extension, flexion 90-100 degrees, Left ankle extension 0/90 degrees, plantar flex 20 degrees. Tibialis anterior 3-/5, ext. Hall. Long. 0/5 Mild to moderate swelling around the left knee. Crepitus is persistent there. Deformities persistent aroound the left leg.  Neurological: He is alert and oriented to person, place, and time.  Chronic changes in left leg. Persistent weakness and limtied rom in the left ankle.  Skin: Skin is warm. Small open 4 mm diameter wound on left tibia, no redness or increased temperature. Psychiatric: He has a normal mood and affect. His behavior is normal. Judgment and thought content normal.       Assessment & Plan:  1. Left lower extremity trauma with tibial plateau fracture and distal  femur fracture with multifactorial pain and  arthritis at the joint.  2. Left Peroneal nerve injury.  PLAN:  1. Discussed management of edema, use of ice etc. Walking exercising in the pool. He will have long term difficulties with swelling based on his activity levels, weight, etc.  2. Continue lyrica 150mg  t.i.d. for his neuropathic  pain. This has helped quite a bit. He may continue with meloxicam and use this prn.  3.Continue Oxycodone 15 mg 1 q.8 h. p.r.n., #90. He's generally using 3 a day at this point.He states, that he would like to increase his Oxycodone. Explained to patient , that all his medication works together, and that he should take his meloxicam and Lyrica as prescribed, before we would think about increasing his Oxycodone, I also explained that most of his pain is nerve pain, and therefore he should take his Lyrica as prescribed, and for his ankle joint pain he should also take his Mobic.  4. Left custom compression stocking/ leg sleeve is helpful.  5. He will need to explore sedentary work options if he hopes to return to the work force.  6. Advised patient to continue with his work out program in his gym, and in the pool, which is very beneficial to build up his strength, losing weight; he already lost 27 lbs ! Advised patient to check with his cardiologist whether he can continue his program, he felt a little dizzy after one workout and is still following up with his cardiologist . He should not do his exercise program before his cardiologist approves. He will have a stress test on 11/26/12. 7. Catarrac surgery on 05/08/2012.  F/u in about 1 month with our PA

## 2012-11-17 NOTE — Patient Instructions (Signed)
Continue with your exercise program as tolerated 

## 2012-11-18 DIAGNOSIS — H2000 Unspecified acute and subacute iridocyclitis: Secondary | ICD-10-CM | POA: Diagnosis not present

## 2012-11-22 ENCOUNTER — Encounter (HOSPITAL_COMMUNITY): Payer: Medicare Other

## 2012-11-26 ENCOUNTER — Ambulatory Visit (HOSPITAL_COMMUNITY): Payer: Medicare Other | Attending: Internal Medicine

## 2012-11-26 DIAGNOSIS — I428 Other cardiomyopathies: Secondary | ICD-10-CM | POA: Diagnosis not present

## 2012-11-26 DIAGNOSIS — R0602 Shortness of breath: Secondary | ICD-10-CM | POA: Diagnosis not present

## 2012-11-26 DIAGNOSIS — I5022 Chronic systolic (congestive) heart failure: Secondary | ICD-10-CM | POA: Diagnosis not present

## 2012-12-05 DIAGNOSIS — H2000 Unspecified acute and subacute iridocyclitis: Secondary | ICD-10-CM | POA: Diagnosis not present

## 2012-12-15 ENCOUNTER — Encounter
Payer: Medicare Other | Attending: Physical Medicine and Rehabilitation | Admitting: Physical Medicine and Rehabilitation

## 2012-12-15 ENCOUNTER — Encounter: Payer: Self-pay | Admitting: Physical Medicine and Rehabilitation

## 2012-12-15 VITALS — BP 144/65 | HR 70 | Resp 14 | Ht 73.0 in | Wt 263.0 lb

## 2012-12-15 DIAGNOSIS — M79609 Pain in unspecified limb: Secondary | ICD-10-CM | POA: Diagnosis not present

## 2012-12-15 DIAGNOSIS — G8929 Other chronic pain: Secondary | ICD-10-CM | POA: Diagnosis not present

## 2012-12-15 DIAGNOSIS — M175 Other unilateral secondary osteoarthritis of knee: Secondary | ICD-10-CM

## 2012-12-15 DIAGNOSIS — M25569 Pain in unspecified knee: Secondary | ICD-10-CM | POA: Diagnosis not present

## 2012-12-15 DIAGNOSIS — S8290XS Unspecified fracture of unspecified lower leg, sequela: Secondary | ICD-10-CM | POA: Insufficient documentation

## 2012-12-15 DIAGNOSIS — S8490XS Injury of unspecified nerve at lower leg level, unspecified leg, sequela: Secondary | ICD-10-CM | POA: Insufficient documentation

## 2012-12-15 DIAGNOSIS — M1710 Unilateral primary osteoarthritis, unspecified knee: Secondary | ICD-10-CM | POA: Insufficient documentation

## 2012-12-15 DIAGNOSIS — M1732 Unilateral post-traumatic osteoarthritis, left knee: Secondary | ICD-10-CM

## 2012-12-15 DIAGNOSIS — S346XXS Injury of peripheral nerve(s) at abdomen, lower back and pelvis level, sequela: Secondary | ICD-10-CM | POA: Diagnosis not present

## 2012-12-15 DIAGNOSIS — X58XXXS Exposure to other specified factors, sequela: Secondary | ICD-10-CM | POA: Insufficient documentation

## 2012-12-15 MED ORDER — OXYCODONE HCL 15 MG PO TABS: 15.0000 mg | ORAL_TABLET | Freq: Four times a day (QID) | ORAL | Status: DC | PRN

## 2012-12-15 NOTE — Progress Notes (Signed)
Subjective:    Patient ID: Victor [name], male    DOB: January 03, 1961, 52 y.o.   MRN: [id number]  HPI Victor [name] is back regarding his chronic left knee and leg pain. His knee pain is doing better overall, but his lower leg pain is still aggrevating. He feels that the lyrica has helped . He's using 3 oxy's 15mg  a day currently.  He's trying to stay somewhat active at home, he has looked into the [employer] sne[employer] program, and [place] [facility]  [place] [facility] [employer] [facility].  He has already lost 46 lbs ! He states, that he his pain has decreased since he lost this weight and strengthened his muscles.    Pain Inventory Average Pain 8 Pain Right Now 8 My pain is constant, sharp, dull, tingling and aching  In the last 24 hours, has pain interfered with the following? General activity 8 Relation with others 8 Enjoyment of life 8 What TIME of day is your pain at its worst? constant Sleep (in general) Fair  Pain is worse with: some activites Pain improves with: medication Relief from Meds: 8  Mobility walk without assistance how many minutes can you walk? 15 Do you have any goals in this area?  yes  Function disabled: date disabled . Do you have any goals in this area?  no  Neuro/Psych No problems in this area  Prior Studies Any changes since last visit?  no  Physicians involved in your care Any changes since last visit?  no   Family History  Problem Relation Age of Onset  . Kidney disease Father    History   Social History  . Marital Status: Married    Spouse Name: N/A    Number of Children: N/A  . Years of Education: N/A   Social History Main Topics  . Smoking status: Never Smoker   . Smokeless tobacco: Never Used  . Alcohol Use: Yes     Comment: occasional  . Drug Use: No  . Sexually Active: None   Other Topics Concern  . None   Social History Narrative  . None   Past Surgical History  Procedure Laterality Date  . Cataract extraction  06/2012  . Leg surgries       4 surgeries  . Retinal detachment surgery  2009   Past Medical History  Diagnosis Date  . Closed fracture of tibia, upper end   . Lesion of lateral popliteal nerve   . Primary localized osteoarthrosis, lower leg   . Primary localized osteoarthrosis, upper arm   . Hypertension   . Breast enlargement 08/28/2012  . Cardiomyopathy- nonischemic 08/28/2012    CATH 5/14 Normal CA  EF 30%  . Chronic systolic heart failure 08/28/2012   BP 144/65  Pulse 70  Resp 14  Ht 6\' 1"  (1.854 m)  Wt 263 lb (119.296 kg)  BMI 34.71 kg/m2  SpO2 97%     Review of Systems  Cardiovascular: Positive for leg swelling.  All other systems reviewed and are negative.       Objective:   Physical Exam Constitutional: He is oriented to person, place, and time. He appears well-developed and well-nourished.  HENT:  Head: Normocephalic and atraumatic.  Eyes: Conjunctivae and EOM are normal.  Neck: Normal range of motion.   Musculoskeletal: left knee full extension, flexion 90-100 degrees, Left ankle extension 0/90 degrees, plantar flex 20 degrees. Tibialis anterior 3-/5, ext. Hall. Long. 0/5, otherwise muscle strength 5/5 Mild to moderate swelling around  the left knee. Crepitus is persistent there. Deformities persistent aroound the left leg.  Neurological: He is alert and oriented to person, place, and time.  Chronic changes in left leg. Persistent weakness and limtied rom in the left ankle.  Skin: Skin is warm. Small open 4 mm diameter wound on left tibia, no redness or increased temperature. Psychiatric: He has a normal mood and affect. His behavior is normal. Judgment and thought content normal.        Assessment & Plan:  1. Left lower extremity trauma with tibial plateau fracture and distal  femur fracture with multifactorial pain and arthritis at the joint.  2. Left Peroneal nerve injury.  PLAN:  1. Discussed management of edema, use of ice etc. Walking exercising in the pool.   2. Continue  lyrica 150mg  t.i.d. for his neuropathic  pain. This has helped quite a bit. He may continue with meloxicam and use this prn.  3.Continue Oxycodone 15 mg 1 q.8 h. p.r.n., #90. He's generally using 3 a day at this point.He states, that this, the Lyrica and the Mobic, is working well for him, at this point.  4. Left custom compression stocking/ leg sleeve is helpful.  5. He will need to explore sedentary work options if he hopes to return to the work force.  6. Advised patient to continue with his work out program in his gym, and in the pool, which is very beneficial to build up his strength, losing weight; he already lost 46 lbs ! Advised patient to check with his cardiologist whether he can continue his program, he felt a little dizzy after one workout and is still following up with his cardiologist . He is allowed to continue his exercise program .  He had a stress test on 11/26/12, which he did well, but still there were some abnormalities in his EKG, he will follow up with his cardiologist soon.  7. Catarrac surgery on 05/08/2012.  F/u in about 1 month with our PA

## 2012-12-15 NOTE — Patient Instructions (Signed)
Continue with your exercise program 

## 2012-12-17 DIAGNOSIS — I428 Other cardiomyopathies: Secondary | ICD-10-CM | POA: Diagnosis not present

## 2012-12-17 DIAGNOSIS — I10 Essential (primary) hypertension: Secondary | ICD-10-CM | POA: Diagnosis not present

## 2012-12-22 ENCOUNTER — Encounter: Payer: Self-pay | Admitting: *Deleted

## 2012-12-26 DIAGNOSIS — H4050X0 Glaucoma secondary to other eye disorders, unspecified eye, stage unspecified: Secondary | ICD-10-CM | POA: Diagnosis not present

## 2012-12-26 DIAGNOSIS — H2000 Unspecified acute and subacute iridocyclitis: Secondary | ICD-10-CM | POA: Diagnosis not present

## 2012-12-30 ENCOUNTER — Telehealth: Payer: Self-pay | Admitting: *Deleted

## 2012-12-30 NOTE — Telephone Encounter (Signed)
Message copied by Baird Lyons on Tue Dec 30, 2012  1:23 PM ------      Message from: Duke Salvia      Created: Mon Dec 29, 2012  4:53 PM       Please Inform Patient that study was normal; and as such thee is currently no indication for an ICD; should collowup with primary cardiologist            Thanks ------

## 2013-01-07 ENCOUNTER — Telehealth: Payer: Self-pay | Admitting: *Deleted

## 2013-01-07 NOTE — Telephone Encounter (Signed)
Placed in error

## 2013-01-13 ENCOUNTER — Encounter
Payer: Medicare Other | Attending: Physical Medicine and Rehabilitation | Admitting: Physical Medicine and Rehabilitation

## 2013-01-13 ENCOUNTER — Encounter: Payer: Self-pay | Admitting: Physical Medicine and Rehabilitation

## 2013-01-13 VITALS — BP 134/67 | HR 81 | Resp 14 | Ht 73.0 in | Wt 260.0 lb

## 2013-01-13 DIAGNOSIS — M25569 Pain in unspecified knee: Secondary | ICD-10-CM | POA: Insufficient documentation

## 2013-01-13 DIAGNOSIS — G8929 Other chronic pain: Secondary | ICD-10-CM | POA: Insufficient documentation

## 2013-01-13 DIAGNOSIS — S8410XA Injury of peroneal nerve at lower leg level, unspecified leg, initial encounter: Secondary | ICD-10-CM | POA: Diagnosis not present

## 2013-01-13 DIAGNOSIS — Z79899 Other long term (current) drug therapy: Secondary | ICD-10-CM | POA: Diagnosis not present

## 2013-01-13 DIAGNOSIS — R609 Edema, unspecified: Secondary | ICD-10-CM | POA: Diagnosis not present

## 2013-01-13 DIAGNOSIS — Z5181 Encounter for therapeutic drug level monitoring: Secondary | ICD-10-CM | POA: Diagnosis not present

## 2013-01-13 DIAGNOSIS — M79609 Pain in unspecified limb: Secondary | ICD-10-CM | POA: Insufficient documentation

## 2013-01-13 DIAGNOSIS — X58XXXA Exposure to other specified factors, initial encounter: Secondary | ICD-10-CM | POA: Insufficient documentation

## 2013-01-13 DIAGNOSIS — M175 Other unilateral secondary osteoarthritis of knee: Secondary | ICD-10-CM | POA: Diagnosis not present

## 2013-01-13 DIAGNOSIS — M1732 Unilateral post-traumatic osteoarthritis, left knee: Secondary | ICD-10-CM

## 2013-01-13 MED ORDER — OXYCODONE HCL 15 MG PO TABS: 15.0000 mg | ORAL_TABLET | Freq: Four times a day (QID) | ORAL | Status: DC | PRN

## 2013-01-13 NOTE — Progress Notes (Signed)
Subjective:    Patient ID: Victor [name], male    DOB: 08-27-1960, 52 y.o.   MRN: [id number]  HPI Victor [name] is back regarding his chronic left knee and leg pain. His knee pain is doing better overall, but his lower leg pain is still aggrevating. He feels that the lyrica has helped . He's using 3 oxy's 15mg  a day currently.  He's trying to stay somewhat active at home, he has looked into the [employer] program, and [place] [facility] [place] [facility] [employer] [facility]. He has already lost 49 lbs ! He states, that he his pain has decreased since he lost this weight and strengthened his muscles.   Pain Inventory Average Pain 8 Pain Right Now 8 My pain is sharp, dull and tingling  In the last 24 hours, has pain interfered with the following? General activity 8 Relation with others 8 Enjoyment of life 8 What TIME of day is your pain at its worst? morning and night Sleep (in general) Fair  Pain is worse with: walking, bending and standing Pain improves with: rest, therapy/exercise and medication Relief from Meds: 8  Mobility walk without assistance Do you have any goals in this area?  no  Function retired Do you have any goals in this area?  no  Neuro/Psych tingling  Prior Studies Any changes since last visit?  no  Physicians involved in your care Any changes since last visit?  no   Family History  Problem Relation Age of Onset  . Kidney disease Father    History   Social History  . Marital Status: Married    Spouse Name: N/A    Number of Children: N/A  . Years of Education: N/A   Social History Main Topics  . Smoking status: Never Smoker   . Smokeless tobacco: Never Used  . Alcohol Use: Yes     Comment: occasional  . Drug Use: No  . Sexual Activity: None   Other Topics Concern  . None   Social History Narrative  . None   Past Surgical History  Procedure Laterality Date  . Cataract extraction  06/2012  . Leg surgries      4 surgeries  . Retinal detachment  surgery  2009   Past Medical History  Diagnosis Date  . Closed fracture of tibia, upper end   . Lesion of lateral popliteal nerve   . Primary localized osteoarthrosis, lower leg   . Primary localized osteoarthrosis, upper arm   . Hypertension   . Breast enlargement 08/28/2012  . Cardiomyopathy- nonischemic 08/28/2012    CATH 5/14 Normal CA  EF 30%  . Chronic systolic heart failure 08/28/2012   BP 134/67  Pulse 81  Resp 14  Ht 6\' 1"  (1.854 m)  Wt 260 lb (117.935 kg)  BMI 34.31 kg/m2  SpO2 95%     Review of Systems  Neurological:       Tingling  All other systems reviewed and are negative.       Objective:   Physical Exam Constitutional: He is oriented to person, place, and time. He appears well-developed and well-nourished.  HENT:  Head: Normocephalic and atraumatic.  Eyes: Conjunctivae and EOM are normal.  Neck: Normal range of motion.   Musculoskeletal: left knee full extension, flexion 90-100 degrees, Left ankle extension 0/90 degrees, plantar flex 20 degrees. Tibialis anterior 3-/5, ext. Hall. Long. 0/5, otherwise muscle strength 5/5 Mild to moderate swelling around the left knee. Crepitus is persistent there. Deformities persistent  aroound the left leg.  Neurological: He is alert and oriented to person, place, and time.  Chronic changes in left leg. Persistent weakness and limtied rom in the left ankle.  Skin: Skin is warm. Small open 4 mm diameter wound on left tibia, no redness or increased temperature. Psychiatric: He has a normal mood and affect. His behavior is normal. Judgment and thought content normal.        Assessment & Plan:  1. Left lower extremity trauma with tibial plateau fracture and distal  femur fracture with multifactorial pain and arthritis at the joint.  2. Left Peroneal nerve injury.  PLAN:  1. Discussed management of edema, use of ice etc. Walking exercising in the pool.  2. Continue lyrica 150mg  t.i.d. for his neuropathic  pain. This  has helped quite a bit. He may continue with meloxicam and use this prn.  3.Continue Oxycodone 15 mg 1 q.8 h. p.r.n., #90. He's generally using 3 a day at this point.He states, that this, the Lyrica and the Mobic, is working well for him, at this point.  4. Left custom compression stocking/ leg sleeve is helpful.  5. He will need to explore sedentary work options if he hopes to return to the work force.  6. Advised patient to continue with his work out program in his gym, and in the pool, which is very beneficial to build up his strength, losing weight; he already lost 49 lbs ! Advised patient to check with his cardiologist whether he can continue his program, he felt a little dizzy after one workout and is still following up with his cardiologist . He is allowed to continue his exercise program . He had a stress test on 11/26/12.In the chart is a note saying that his stress test was normal. Patient will follow up with his cardiologist.  7. Catarrac surgery on 05/08/2012.  F/u in about 1 month with our PA

## 2013-01-13 NOTE — Patient Instructions (Signed)
Continue with your exercise program 

## 2013-01-20 ENCOUNTER — Encounter: Payer: Self-pay | Admitting: *Deleted

## 2013-01-23 DIAGNOSIS — H4050X0 Glaucoma secondary to other eye disorders, unspecified eye, stage unspecified: Secondary | ICD-10-CM | POA: Diagnosis not present

## 2013-01-23 DIAGNOSIS — H00019 Hordeolum externum unspecified eye, unspecified eyelid: Secondary | ICD-10-CM | POA: Diagnosis not present

## 2013-01-23 DIAGNOSIS — H2010 Chronic iridocyclitis, unspecified eye: Secondary | ICD-10-CM | POA: Diagnosis not present

## 2013-01-30 DIAGNOSIS — H4050X0 Glaucoma secondary to other eye disorders, unspecified eye, stage unspecified: Secondary | ICD-10-CM | POA: Diagnosis not present

## 2013-01-30 DIAGNOSIS — H2010 Chronic iridocyclitis, unspecified eye: Secondary | ICD-10-CM | POA: Diagnosis not present

## 2013-02-09 DIAGNOSIS — Z961 Presence of intraocular lens: Secondary | ICD-10-CM | POA: Diagnosis not present

## 2013-02-09 DIAGNOSIS — H4011X0 Primary open-angle glaucoma, stage unspecified: Secondary | ICD-10-CM | POA: Diagnosis not present

## 2013-02-09 DIAGNOSIS — H409 Unspecified glaucoma: Secondary | ICD-10-CM | POA: Diagnosis not present

## 2013-02-10 ENCOUNTER — Encounter: Payer: Self-pay | Admitting: Physical Medicine and Rehabilitation

## 2013-02-10 ENCOUNTER — Encounter
Payer: Medicare Other | Attending: Physical Medicine and Rehabilitation | Admitting: Physical Medicine and Rehabilitation

## 2013-02-10 VITALS — BP 135/81 | HR 70 | Resp 14 | Ht 73.0 in | Wt 259.0 lb

## 2013-02-10 DIAGNOSIS — M216X9 Other acquired deformities of unspecified foot: Secondary | ICD-10-CM | POA: Diagnosis not present

## 2013-02-10 DIAGNOSIS — M1732 Unilateral post-traumatic osteoarthritis, left knee: Secondary | ICD-10-CM

## 2013-02-10 DIAGNOSIS — M175 Other unilateral secondary osteoarthritis of knee: Secondary | ICD-10-CM | POA: Diagnosis not present

## 2013-02-10 DIAGNOSIS — S8990XA Unspecified injury of unspecified lower leg, initial encounter: Secondary | ICD-10-CM | POA: Diagnosis not present

## 2013-02-10 DIAGNOSIS — X58XXXA Exposure to other specified factors, initial encounter: Secondary | ICD-10-CM | POA: Insufficient documentation

## 2013-02-10 DIAGNOSIS — M1710 Unilateral primary osteoarthritis, unspecified knee: Secondary | ICD-10-CM | POA: Diagnosis not present

## 2013-02-10 DIAGNOSIS — Z79899 Other long term (current) drug therapy: Secondary | ICD-10-CM | POA: Insufficient documentation

## 2013-02-10 DIAGNOSIS — H401190 Primary open-angle glaucoma, unspecified eye, stage unspecified: Secondary | ICD-10-CM | POA: Insufficient documentation

## 2013-02-10 MED ORDER — OXYCODONE HCL 15 MG PO TABS: 15.0000 mg | ORAL_TABLET | Freq: Four times a day (QID) | ORAL | Status: DC | PRN

## 2013-02-10 MED ORDER — DICLOFENAC SODIUM 1 % TD GEL: 2.0000 g | Freq: Four times a day (QID) | TRANSDERMAL | Status: DC

## 2013-02-10 NOTE — Progress Notes (Signed)
Subjective:    Patient ID: Victor [name], male    DOB: [birth year], 52 y.o.   MRN: [id number]  HPI [name] His knee pain is doing better overall, but his lower leg pain is still aggrevating. He feels that the lyrica has helped . He's using 3 oxy's 15mg  a day currently.  He's trying to stay somewhat active at home, he has looked into the [employer] sne[employer] program, and [place] [employer] [place]. He has already lost 49 lbs ! He states, that he his pain has decreased since he lost this weight and strengthened his muscles.   Pain Inventory Average Pain 8 Pain Right Now 8 My pain is sharp, dull and tingling  In the last 24 hours, has pain interfered with the following? General activity 8 Relation with others 8 Enjoyment of life 8 What TIME of day is your pain at its worst? varies Sleep (in general) Fair  Pain is worse with: walking, bending and standing Pain improves with: rest, therapy/exercise and medication Relief from Meds: 8  Mobility walk without assistance how many minutes can you walk? 30 Do you have any goals in this area?  yes  Function disabled: date disabled . retired Do you have any goals in this area?  no  Neuro/Psych numbness  Prior Studies Any changes since last visit?  no  Physicians involved in your care Any changes since last visit?  no   Family History  Problem Relation Age of Onset  . Kidney disease Father    History   Social History  . Marital Status: Married    Spouse Name: N/A    Number of Children: N/A  . Years of Education: N/A   Social History Main Topics  . Smoking status: Never Smoker   . Smokeless tobacco: Never Used  . Alcohol Use: Yes     Comment: occasional  . Drug Use: No  . Sexual Activity: None   Other Topics Concern  . None   Social History Narrative  . None   Past Surgical History  Procedure Laterality Date  . Cataract extraction  06/2012  . Leg  surgries      4 surgeries  . Retinal detachment surgery  2009   Past Medical History  Diagnosis Date  . Closed fracture of tibia, upper end   . Lesion of lateral popliteal nerve   . Primary localized osteoarthrosis, lower leg   . Primary localized osteoarthrosis, upper arm   . Hypertension   . Breast enlargement 08/28/2012  . Cardiomyopathy- nonischemic 08/28/2012    CATH 5/14 Normal CA  EF 30%  . Chronic systolic heart failure 08/28/2012   BP 135/81  Pulse 70  Resp 14  Ht 6\' 1"  (1.854 m)  Wt 259 lb (117.482 kg)  BMI 34.18 kg/m2  SpO2 95%     Review of Systems  Neurological: Positive for numbness.  All other systems reviewed and are negative.       Objective:   Physical Exam  Constitutional: He is oriented to person, place, and time. He appears well-developed and well-nourished.  HENT:  Head: Normocephalic and atraumatic.  Eyes: Conjunctivae and EOM are normal.  Neck: Normal range of motion.   Musculoskeletal: left knee full extension, flexion 90-100 degrees, Left ankle extension 0/90 degrees, plantar flex 20 degrees. Tibialis anterior 3-/5, ext. Hall. Long. 0/5, otherwise muscle strength 5/5 Mild to moderate swelling around the left knee.  Crepitus is persistent there. Deformities persistent aroound the left leg.  Neurological: He is alert and oriented to person, place, and time.  Chronic changes in left leg. Persistent weakness and limtied rom in the left ankle.  Skin: Skin is warm. Small open 4 mm diameter wound on left tibia, no redness or increased temperature. Psychiatric: He has a normal mood and affect. His behavior is normal. Judgment and thought content normal.       Assessment & Plan:  1. Left lower extremity trauma with tibial plateau fracture and distal  femur fracture with multifactorial pain and arthritis at the joint.  2. Left Peroneal nerve injury.  3. 4mm open wound on left lower leg, oozing a little , no redness or increased temperature or  swelling, advised patient to follow up with wound care, this wound has been open and closed for several month now, it heels and then it opens up again and has some yellowish drainage. PLAN:  1. Discussed management of edema, use of ice etc. Walking exercising in the pool.  2. Continue lyrica 150mg  t.i.d. for his neuropathic  pain. This has helped quite a bit. He may continue with meloxicam and use this prn.  3.Continue Oxycodone 15 mg 1 q.8 h. p.r.n., #90. He's generally using 3 a day at this point.He states, that this, the Lyrica and the Mobic, is working well for him, at this point.  4. Left custom compression stocking/ leg sleeve is helpful.  5. He will need to explore sedentary work options if he hopes to return to the work force.  6. Advised patient to continue with his work out program in his gym, and in the pool, which is very beneficial to build up his strength, losing weight; he already lost 50 lbs ! Advised patient to check with his cardiologist whether he can continue his program, he felt a little dizzy after one workout and is still following up with his cardiologist . He is allowed to continue his exercise program . He had a stress test on 11/26/12.In the chart is a note saying that his stress test was normal. Patient will follow up with his cardiologist.  7. Catarrac surgery on 05/08/2012.  F/u in about 1 month with our PA

## 2013-02-10 NOTE — Patient Instructions (Addendum)
Continue with your exercise program. Follow up with wound care for your open wound.

## 2013-03-09 DIAGNOSIS — H4050X0 Glaucoma secondary to other eye disorders, unspecified eye, stage unspecified: Secondary | ICD-10-CM | POA: Diagnosis not present

## 2013-03-09 DIAGNOSIS — H2010 Chronic iridocyclitis, unspecified eye: Secondary | ICD-10-CM | POA: Diagnosis not present

## 2013-03-10 ENCOUNTER — Encounter (HOSPITAL_BASED_OUTPATIENT_CLINIC_OR_DEPARTMENT_OTHER): Payer: Medicare Other | Attending: General Surgery

## 2013-03-10 ENCOUNTER — Encounter: Payer: Self-pay | Admitting: Physical Medicine and Rehabilitation

## 2013-03-10 ENCOUNTER — Encounter
Payer: Medicare Other | Attending: Physical Medicine and Rehabilitation | Admitting: Physical Medicine and Rehabilitation

## 2013-03-10 VITALS — BP 120/60 | HR 83 | Resp 14 | Ht 73.0 in | Wt 260.6 lb

## 2013-03-10 DIAGNOSIS — S72409A Unspecified fracture of lower end of unspecified femur, initial encounter for closed fracture: Secondary | ICD-10-CM | POA: Insufficient documentation

## 2013-03-10 DIAGNOSIS — X58XXXA Exposure to other specified factors, initial encounter: Secondary | ICD-10-CM | POA: Insufficient documentation

## 2013-03-10 DIAGNOSIS — S8410XA Injury of peroneal nerve at lower leg level, unspecified leg, initial encounter: Secondary | ICD-10-CM | POA: Diagnosis not present

## 2013-03-10 DIAGNOSIS — S82109A Unspecified fracture of upper end of unspecified tibia, initial encounter for closed fracture: Secondary | ICD-10-CM | POA: Diagnosis not present

## 2013-03-10 DIAGNOSIS — M175 Other unilateral secondary osteoarthritis of knee: Secondary | ICD-10-CM

## 2013-03-10 DIAGNOSIS — M1710 Unilateral primary osteoarthritis, unspecified knee: Secondary | ICD-10-CM | POA: Diagnosis not present

## 2013-03-10 DIAGNOSIS — S81009A Unspecified open wound, unspecified knee, initial encounter: Secondary | ICD-10-CM | POA: Insufficient documentation

## 2013-03-10 DIAGNOSIS — M1732 Unilateral post-traumatic osteoarthritis, left knee: Secondary | ICD-10-CM

## 2013-03-10 DIAGNOSIS — R609 Edema, unspecified: Secondary | ICD-10-CM | POA: Diagnosis not present

## 2013-03-10 DIAGNOSIS — S8412XS Injury of peroneal nerve at lower leg level, left leg, sequela: Secondary | ICD-10-CM

## 2013-03-10 MED ORDER — OXYCODONE HCL 15 MG PO TABS: 15.0000 mg | ORAL_TABLET | Freq: Four times a day (QID) | ORAL | Status: DC | PRN

## 2013-03-10 NOTE — Patient Instructions (Signed)
Continue with your exercise program as tolerated 

## 2013-03-10 NOTE — Progress Notes (Signed)
Subjective:    Patient ID: Victor [name], male    DOB: [birth year], 52 y.o.   MRN: [id number]  HPI Victor [name] is back regarding his chronic left knee and leg pain. His knee pain is doing better overall, but his lower leg pain is still aggrevating. He feels that the lyrica has helped . He's using 3 oxy's 15mg  a day currently.  He's trying to stay somewhat active at home, he has looked into the [employer] program, and he [place] [employer] [place] for 90 min. He has already lost 50 lbs ! He states, that he his pain has decreased since he lost this weight and strengthened his muscles.   Pain Inventory Average Pain 8 Pain Right Now 8 My pain is sharp, dull and tingling  In the last 24 hours, has pain interfered with the following? General activity 8 Relation with others 8 Enjoyment of life 8 What TIME of day is your pain at its worst? morning, night Sleep (in general) Fair  Pain is worse with: walking and standing Pain improves with: rest and medication Relief from Meds: 8  Mobility walk without assistance how many minutes can you walk? 30 ability to climb steps?  yes do you drive?  yes transfers alone Do you have any goals in this area?  no  Function disabled: date disabled na retired Do you have any goals in this area?  no  Neuro/Psych numbness  Prior Studies Any changes since last visit?  no  Physicians involved in your care Any changes since last visit?  no   Family History  Problem Relation Age of Onset  . Kidney disease Father    History   Social History  . Marital Status: Married    Spouse Name: N/A    Number of Children: N/A  . Years of Education: N/A   Social History Main Topics  . Smoking status: Never Smoker   . Smokeless tobacco: Never Used  . Alcohol Use: Yes     Comment: occasional  . Drug Use: No  . Sexual Activity: None   Other Topics Concern  . None   Social History Narrative  . None   Past Surgical History   Procedure Laterality Date  . Cataract extraction  06/2012  . Leg surgries      4 surgeries  . Retinal detachment surgery  2009   Past Medical History  Diagnosis Date  . Closed fracture of tibia, upper end   . Lesion of lateral popliteal nerve   . Primary localized osteoarthrosis, lower leg   . Primary localized osteoarthrosis, upper arm   . Hypertension   . Breast enlargement 08/28/2012  . Cardiomyopathy- nonischemic 08/28/2012    CATH 5/14 Normal CA  EF 30%  . Chronic systolic heart failure 08/28/2012   BP 120/60  Pulse 83  Resp 14  Ht 6\' 1"  (1.854 m)  Wt 260 lb 9.6 oz (118.207 kg)  BMI 34.39 kg/m2  SpO2 93%      Review of Systems  Neurological: Positive for numbness.       Tingling       Objective:   Physical Exam Constitutional: He is oriented to person, place, and time. He appears well-developed and well-nourished.  HENT:  Head: Normocephalic and atraumatic.  Eyes: Conjunctivae and EOM are normal.  Neck: Normal range of motion.   Musculoskeletal: left knee full extension, flexion 90-100 degrees, Left ankle extension 0/90 degrees, plantar flex 20 degrees. Tibialis anterior 3-/5,  ext. Victor [name]. Long. 0/5, otherwise muscle strength 5/5 Mild to moderate swelling around the left knee. Crepitus is persistent there. Deformities persistent aroound the left leg.  Neurological: He is alert and oriented to person, place, and time.  Chronic changes in left leg. Persistent weakness and limtied rom in the left ankle.  Skin: Skin is warm. Small open 4 mm diameter wound on left tibia, no redness or increased temperature. Psychiatric: He has a normal mood and affect. His behavior is normal. Judgment and thought content normal.        Assessment & Plan:  1. Left lower extremity trauma with tibial plateau fracture and distal  femur fracture with multifactorial pain and arthritis at the joint.  2. Left Peroneal nerve injury.  3. 4mm open wound on left lower leg, oozing a little ,  no redness or increased temperature or swelling, advised patient to follow up with wound care, this wound has been open and closed for several month now, it heels and then it opens up again and has some yellowish drainage. He has an appointment today with wound care. PLAN:  1. Discussed management of edema, use of ice etc. Walking exercising in the pool.  2. Continue lyrica 150mg  t.i.d. for his neuropathic  pain. This has helped quite a bit. He may continue with meloxicam and use this prn.  3.Continue Oxycodone 15 mg 1 q.8 h. p.r.n., #90. He's generally using 3 a day at this point.He states, that this, the Lyrica and the Mobic, is working well for him, at this point.  4. Left custom compression stocking/ leg sleeve is helpful.  5. He will need to explore sedentary work options if he hopes to return to the work force.  6. Advised patient to continue with his work out program in his gym, and in the pool, which is very beneficial to build up his strength, losing weight; he already lost 50 lbs ! Also told patient which exercise machines would be beneficial to him, and which ones he should not use.  Advised patient to check with his cardiologist whether he can continue his program, he felt a little dizzy after one workout and is still following up with his cardiologist . He is allowed to continue his exercise program . He had a stress test on 11/26/12.In the chart is a note saying that his stress test was normal. Patient will follow up with his cardiologist.  7. Catarrac surgery on 05/08/2012.  F/u in about 1 month with our PA

## 2013-03-16 DIAGNOSIS — H4011X0 Primary open-angle glaucoma, stage unspecified: Secondary | ICD-10-CM | POA: Diagnosis not present

## 2013-03-16 DIAGNOSIS — J45909 Unspecified asthma, uncomplicated: Secondary | ICD-10-CM | POA: Insufficient documentation

## 2013-03-16 DIAGNOSIS — I428 Other cardiomyopathies: Secondary | ICD-10-CM | POA: Diagnosis not present

## 2013-03-16 DIAGNOSIS — I10 Essential (primary) hypertension: Secondary | ICD-10-CM | POA: Insufficient documentation

## 2013-03-16 DIAGNOSIS — Z95828 Presence of other vascular implants and grafts: Secondary | ICD-10-CM | POA: Insufficient documentation

## 2013-03-16 DIAGNOSIS — H409 Unspecified glaucoma: Secondary | ICD-10-CM | POA: Diagnosis not present

## 2013-03-16 DIAGNOSIS — Z79899 Other long term (current) drug therapy: Secondary | ICD-10-CM | POA: Diagnosis not present

## 2013-03-16 DIAGNOSIS — H4010X0 Unspecified open-angle glaucoma, stage unspecified: Secondary | ICD-10-CM | POA: Diagnosis not present

## 2013-03-16 DIAGNOSIS — Z961 Presence of intraocular lens: Secondary | ICD-10-CM | POA: Diagnosis not present

## 2013-03-31 ENCOUNTER — Other Ambulatory Visit: Payer: Self-pay | Admitting: Physical Medicine & Rehabilitation

## 2013-03-31 NOTE — Telephone Encounter (Signed)
I am not prescribing Viagra, it was prescribed by his PCP, he should ask them to refill

## 2013-04-08 ENCOUNTER — Encounter
Payer: Medicare Other | Attending: Physical Medicine and Rehabilitation | Admitting: Physical Medicine and Rehabilitation

## 2013-04-08 ENCOUNTER — Encounter: Payer: Self-pay | Admitting: Physical Medicine and Rehabilitation

## 2013-04-08 ENCOUNTER — Encounter (HOSPITAL_BASED_OUTPATIENT_CLINIC_OR_DEPARTMENT_OTHER): Payer: Medicare Other | Attending: General Surgery

## 2013-04-08 VITALS — BP 131/75 | HR 64 | Resp 14 | Ht 73.0 in | Wt 265.0 lb

## 2013-04-08 DIAGNOSIS — L97909 Non-pressure chronic ulcer of unspecified part of unspecified lower leg with unspecified severity: Secondary | ICD-10-CM | POA: Insufficient documentation

## 2013-04-08 DIAGNOSIS — G8929 Other chronic pain: Secondary | ICD-10-CM | POA: Insufficient documentation

## 2013-04-08 DIAGNOSIS — I872 Venous insufficiency (chronic) (peripheral): Secondary | ICD-10-CM | POA: Diagnosis not present

## 2013-04-08 DIAGNOSIS — Y838 Other surgical procedures as the cause of abnormal reaction of the patient, or of later complication, without mention of misadventure at the time of the procedure: Secondary | ICD-10-CM | POA: Insufficient documentation

## 2013-04-08 DIAGNOSIS — S346XXS Injury of peripheral nerve(s) at abdomen, lower back and pelvis level, sequela: Secondary | ICD-10-CM | POA: Insufficient documentation

## 2013-04-08 DIAGNOSIS — S8290XS Unspecified fracture of unspecified lower leg, sequela: Secondary | ICD-10-CM | POA: Insufficient documentation

## 2013-04-08 DIAGNOSIS — I10 Essential (primary) hypertension: Secondary | ICD-10-CM | POA: Insufficient documentation

## 2013-04-08 DIAGNOSIS — M25569 Pain in unspecified knee: Secondary | ICD-10-CM | POA: Diagnosis not present

## 2013-04-08 DIAGNOSIS — M1710 Unilateral primary osteoarthritis, unspecified knee: Secondary | ICD-10-CM | POA: Diagnosis not present

## 2013-04-08 DIAGNOSIS — T8189XA Other complications of procedures, not elsewhere classified, initial encounter: Secondary | ICD-10-CM | POA: Diagnosis not present

## 2013-04-08 DIAGNOSIS — I87319 Chronic venous hypertension (idiopathic) with ulcer of unspecified lower extremity: Secondary | ICD-10-CM | POA: Diagnosis not present

## 2013-04-08 DIAGNOSIS — I428 Other cardiomyopathies: Secondary | ICD-10-CM | POA: Diagnosis not present

## 2013-04-08 DIAGNOSIS — M1732 Unilateral post-traumatic osteoarthritis, left knee: Secondary | ICD-10-CM

## 2013-04-08 DIAGNOSIS — S81009A Unspecified open wound, unspecified knee, initial encounter: Secondary | ICD-10-CM | POA: Diagnosis not present

## 2013-04-08 DIAGNOSIS — M175 Other unilateral secondary osteoarthritis of knee: Secondary | ICD-10-CM

## 2013-04-08 DIAGNOSIS — X58XXXS Exposure to other specified factors, sequela: Secondary | ICD-10-CM | POA: Insufficient documentation

## 2013-04-08 DIAGNOSIS — S8412XS Injury of peroneal nerve at lower leg level, left leg, sequela: Secondary | ICD-10-CM

## 2013-04-08 DIAGNOSIS — S8490XS Injury of unspecified nerve at lower leg level, unspecified leg, sequela: Secondary | ICD-10-CM | POA: Insufficient documentation

## 2013-04-08 MED ORDER — OXYCODONE HCL 15 MG PO TABS: 15.0000 mg | ORAL_TABLET | Freq: Four times a day (QID) | ORAL | Status: DC | PRN

## 2013-04-08 NOTE — Patient Instructions (Signed)
Continue with your exercise program 

## 2013-04-08 NOTE — Progress Notes (Signed)
Subjective:    Patient ID: Victor [name], male    DOB: 1960-09-17, 52 y.o.   MRN: [id number]  HPI Victor [name] is back regarding his chronic left knee and leg pain. His knee pain is doing better overall, but his lower leg pain is still aggrevating. He feels that the lyrica has helped . He's using 3 oxy's 15mg  a day currently.  He's trying to stay somewhat active at home, he has looked into the [employer] sne[employer] program, and he [place] [employer] [place] for 90 min. He has already lost 50 lbs ! He states, that he his pain has decreased since he lost this weight and strengthened his muscles.   Pain Inventory Average Pain 8 Pain Right Now 8 My pain is intermittent  In the last 24 hours, has pain interfered with the following? General activity 8 Relation with others 8 Enjoyment of life 8 What TIME of day is your pain at its worst? morning and night Sleep (in general) Fair  Pain is worse with: walking and standing Pain improves with: rest, therapy/exercise and medication Relief from Meds: 8  Mobility walk without assistance how many minutes can you walk? 30 Do you have any goals in this area?  yes  Function retired  Neuro/Psych numbness  Prior Studies Any changes since last visit?  no  Physicians involved in your care Any changes since last visit?  no   Family History  Problem Relation Age of Onset  . Kidney disease Father    History   Social History  . Marital Status: Married    Spouse Name: N/A    Number of Children: N/A  . Years of Education: N/A   Social History Main Topics  . Smoking status: Never Smoker   . Smokeless tobacco: Never Used  . Alcohol Use: Yes     Comment: occasional  . Drug Use: No  . Sexual Activity: None   Other Topics Concern  . None   Social History Narrative  . None   Past Surgical History  Procedure Laterality Date  . Cataract extraction  06/2012  . Leg surgries      4 surgeries  . Retinal detachment surgery  2009     Past Medical History  Diagnosis Date  . Closed fracture of tibia, upper end   . Lesion of lateral popliteal nerve   . Primary localized osteoarthrosis, lower leg   . Primary localized osteoarthrosis, upper arm   . Hypertension   . Breast enlargement 08/28/2012  . Cardiomyopathy- nonischemic 08/28/2012    CATH 5/14 Normal CA  EF 30%  . Chronic systolic heart failure 08/28/2012   BP 131/75  Pulse 64  Resp 14  Ht 6\' 1"  (1.854 m)  Wt 265 lb (120.203 kg)  BMI 34.97 kg/m2  SpO2 96%     Review of Systems  Neurological: Positive for numbness.  All other systems reviewed and are negative.       Objective:   Physical Exam Constitutional: He is oriented to person, place, and time. He appears well-developed and well-nourished.  HENT:  Head: Normocephalic and atraumatic.  Eyes: Conjunctivae and EOM are normal.  Neck: Normal range of motion.   Musculoskeletal: left knee full extension, flexion 90-100 degrees, Left ankle extension 0/90 degrees, plantar flex 20 degrees. Tibialis anterior 3-/5, ext. Hall. Long. 0/5, otherwise muscle strength 5/5 Mild to moderate swelling around the left knee. Crepitus is persistent there. Deformities persistent aroound the left leg.  Neurological:  He is alert and oriented to person, place, and time.  Chronic changes in left leg. Persistent weakness and limtied rom in the left ankle.  Skin: Skin is warm. Small open 4 mm diameter wound on left tibia, today he has a bandage over it  Psychiatric: He has a normal mood and affect. His behavior is normal. Judgment and thought content normal.        Assessment & Plan:  1. Left lower extremity trauma with tibial plateau fracture and distal  femur fracture with multifactorial pain and arthritis at the joint.  2. Left Peroneal nerve injury.  3. 4mm open wound on left lower leg, oozing a little , no redness or increased temperature or swelling, advised patient to follow up with wound care at his last visit,  this wound has been open and closed for several month now, it heels and then it opens up again and has some yellowish drainage. He had an appointment  with wound care, he is taking bactrim orally, and is further following up for his wound care.  PLAN:  1. Discussed management of edema, use of ice etc. Walking exercising in the pool.  2. Continue lyrica 150mg  t.i.d. for his neuropathic  pain. This has helped quite a bit. He may continue with meloxicam and use this prn.  3.Continue Oxycodone 15 mg 1 q.8 h. p.r.n., #90. He's generally using 3 a day at this point.He states, that this, the Lyrica and the Mobic, is working well for him, at this point.  4. Left custom compression stocking/ leg sleeve is helpful.  5. He will need to explore sedentary work options if he hopes to return to the work force.  6. Advised patient to continue with his work out program in his gym, and in the pool, which is very beneficial to build up his strength, losing weight; he already lost 50 lbs ! Also told patient which exercise machines would be beneficial to him, and which ones he should not use.  Advised patient to check with his cardiologist whether he can continue his program, he felt a little dizzy after one workout and is still following up with his cardiologist . He is allowed to continue his exercise program . He had a stress test on 11/26/12.In the chart is a note saying that his stress test was normal. Patient will follow up with his cardiologist.  7. Catarrac surgery on 05/08/2012.  F/u in about 1 month with our PA

## 2013-04-09 NOTE — Progress Notes (Signed)
[facility]  NAME:  Victor [name], Victor [name] NO.:  [id number]  MEDICAL RECORD NO.:  [id number]      DATE OF BIRTH:  [birth year]  PHYSICIAN:  Ardath Sax, M.D.           VISIT DATE:                                  OFFICE VISIT   This is a 52 year old African American male who has had a great deal of trauma to his left leg and many surgeries after this accident.  He had to have a rod and screws put in the left tibia and later had necrosis of the skin after a fasciotomy had to be done because of marked swelling of the leg.  He then had a muscle flap and a free flap over the defect on the anterior aspect of his left leg, and this eventually healed.  He now enters with a problem of small sinus tract on the anterior aspect of his left leg.  It is only about 3 mm in diameter.  It does drop down about a 0.5 cm into the muscle flap that was utilized to cover his tibia.  This has been cultured and has been treated with antibiotics without much success.  I think there is a certain element of venous hypertension here just because of all the surgery that has resulted in some venostasis. Today, I am going to treat it with silver collagen and compression with a boot.  We will also discuss with his insurance company for an Apligraf, as I think that may help him to epithelialize this chronic nonhealing ulcer wound.     Ardath Sax, M.D.     PP/MEDQ  D:  04/08/2013  T:  04/09/2013  Job:  [id number]

## 2013-04-14 DIAGNOSIS — H3581 Retinal edema: Secondary | ICD-10-CM | POA: Diagnosis not present

## 2013-04-23 ENCOUNTER — Other Ambulatory Visit: Payer: Self-pay | Admitting: *Deleted

## 2013-04-23 DIAGNOSIS — M1732 Unilateral post-traumatic osteoarthritis, left knee: Secondary | ICD-10-CM

## 2013-04-23 MED ORDER — OXYCODONE HCL 15 MG PO TABS: 15.0000 mg | ORAL_TABLET | Freq: Four times a day (QID) | ORAL | Status: DC | PRN

## 2013-04-23 NOTE — Telephone Encounter (Signed)
RX printed early for controlled medication for the visit with RN on 05/05/13 (to be signed by MD) 

## 2013-05-05 ENCOUNTER — Encounter: Payer: Medicare Other | Attending: Physical Medicine & Rehabilitation | Admitting: *Deleted

## 2013-05-05 ENCOUNTER — Telehealth: Payer: Self-pay | Admitting: *Deleted

## 2013-05-05 ENCOUNTER — Encounter: Payer: Self-pay | Admitting: *Deleted

## 2013-05-05 VITALS — BP 113/67 | HR 84 | Resp 14

## 2013-05-05 DIAGNOSIS — X58XXXS Exposure to other specified factors, sequela: Secondary | ICD-10-CM | POA: Insufficient documentation

## 2013-05-05 DIAGNOSIS — S346XXS Injury of peripheral nerve(s) at abdomen, lower back and pelvis level, sequela: Secondary | ICD-10-CM | POA: Insufficient documentation

## 2013-05-05 DIAGNOSIS — S81009A Unspecified open wound, unspecified knee, initial encounter: Secondary | ICD-10-CM | POA: Diagnosis not present

## 2013-05-05 DIAGNOSIS — M259 Joint disorder, unspecified: Secondary | ICD-10-CM | POA: Insufficient documentation

## 2013-05-05 DIAGNOSIS — G8929 Other chronic pain: Secondary | ICD-10-CM | POA: Diagnosis not present

## 2013-05-05 DIAGNOSIS — S8290XS Unspecified fracture of unspecified lower leg, sequela: Secondary | ICD-10-CM | POA: Diagnosis not present

## 2013-05-05 DIAGNOSIS — S8490XS Injury of unspecified nerve at lower leg level, unspecified leg, sequela: Secondary | ICD-10-CM | POA: Insufficient documentation

## 2013-05-05 DIAGNOSIS — I428 Other cardiomyopathies: Secondary | ICD-10-CM | POA: Diagnosis not present

## 2013-05-05 DIAGNOSIS — M1732 Unilateral post-traumatic osteoarthritis, left knee: Secondary | ICD-10-CM

## 2013-05-05 DIAGNOSIS — M25569 Pain in unspecified knee: Secondary | ICD-10-CM | POA: Diagnosis not present

## 2013-05-05 DIAGNOSIS — I10 Essential (primary) hypertension: Secondary | ICD-10-CM | POA: Insufficient documentation

## 2013-05-05 NOTE — Progress Notes (Signed)
Here for pill count and medication refills. Oxycodone 15 mg # 90 Fill date 04/08/13   Today NV#  0  Out about 2 days early but rx is for q 6 hr and he gets #90 and has a new injury..  Refill given. VSS   Pain level: 8  He has been working out about 6 days per week and thinks he has a torn rotator cuff.  Will try to schedule with Dr Riley Kill  asap or send message to see if he wants to schedule and MRI. Has torn this before (left shoulder). He has had knee surgery and has a few screws in left knee.  Opioid risk score done and it is 0.

## 2013-05-05 NOTE — Telephone Encounter (Signed)
Victor [name] was in to see me today and has been working out about 6 days a week.  He thinks he has torn his left rotator cuff ( he has done this before in the 90's).  He has had knee surgery and has some screws in, but he was wondering about an MRI.  I asked them to get him in to see you, but didn't know if you wanted some diagnostics done before.  They could not schedule with you so he will see Pam 05/28/13

## 2013-05-05 NOTE — Patient Instructions (Addendum)
Follow up  With Dr Riley Kill next month asap

## 2013-05-05 NOTE — Telephone Encounter (Signed)
I would put him in with me at my next cancellation so that I can take a look.  No scans without exam, sorry

## 2013-05-05 NOTE — Telephone Encounter (Signed)
See Dr Riley Kill message, put Mr Mapps on call list for cancellation

## 2013-05-06 DIAGNOSIS — I87319 Chronic venous hypertension (idiopathic) with ulcer of unspecified lower extremity: Secondary | ICD-10-CM | POA: Diagnosis not present

## 2013-05-06 DIAGNOSIS — I872 Venous insufficiency (chronic) (peripheral): Secondary | ICD-10-CM | POA: Diagnosis not present

## 2013-05-06 DIAGNOSIS — T8189XA Other complications of procedures, not elsewhere classified, initial encounter: Secondary | ICD-10-CM | POA: Diagnosis not present

## 2013-05-11 ENCOUNTER — Encounter: Payer: Medicare Other | Attending: Physical Medicine and Rehabilitation | Admitting: Physical Medicine & Rehabilitation

## 2013-05-11 ENCOUNTER — Encounter: Payer: Self-pay | Admitting: Physical Medicine & Rehabilitation

## 2013-05-11 VITALS — BP 132/79 | HR 78 | Resp 14 | Ht 71.0 in | Wt 261.2 lb

## 2013-05-11 DIAGNOSIS — M722 Plantar fascial fibromatosis: Secondary | ICD-10-CM

## 2013-05-11 DIAGNOSIS — M7520 Bicipital tendinitis, unspecified shoulder: Secondary | ICD-10-CM | POA: Diagnosis not present

## 2013-05-11 DIAGNOSIS — M175 Other unilateral secondary osteoarthritis of knee: Secondary | ICD-10-CM | POA: Insufficient documentation

## 2013-05-11 DIAGNOSIS — X58XXXA Exposure to other specified factors, initial encounter: Secondary | ICD-10-CM | POA: Insufficient documentation

## 2013-05-11 DIAGNOSIS — IMO0002 Reserved for concepts with insufficient information to code with codable children: Secondary | ICD-10-CM

## 2013-05-11 DIAGNOSIS — M7522 Bicipital tendinitis, left shoulder: Secondary | ICD-10-CM

## 2013-05-11 DIAGNOSIS — S8490XS Injury of unspecified nerve at lower leg level, unspecified leg, sequela: Secondary | ICD-10-CM | POA: Diagnosis not present

## 2013-05-11 DIAGNOSIS — S346XXS Injury of peripheral nerve(s) at abdomen, lower back and pelvis level, sequela: Secondary | ICD-10-CM

## 2013-05-11 DIAGNOSIS — M1732 Unilateral post-traumatic osteoarthritis, left knee: Secondary | ICD-10-CM

## 2013-05-11 DIAGNOSIS — S8412XS Injury of peroneal nerve at lower leg level, left leg, sequela: Secondary | ICD-10-CM

## 2013-05-11 MED ORDER — OXYCODONE HCL 15 MG PO TABS: 15.0000 mg | ORAL_TABLET | Freq: Four times a day (QID) | ORAL | Status: DC | PRN

## 2013-05-11 MED ORDER — PREDNISONE 20 MG PO TABS: 20.0000 mg | ORAL_TABLET | Freq: Every day | ORAL | Status: DC

## 2013-05-11 NOTE — Patient Instructions (Signed)
Biceps Tendon Tendinitis (Proximal) and Tenosynovitis with Rehab Tendonitis and tenosynovitis involve inflammation of the tendon and the tendon lining (sheath). The proximal biceps tendon is vulnerable to tendonitis and tenosynovitis, which causes pain and discomfort in the front of the shoulder and upper arm. The tendon lining secretes a fluid that helps lubricate the tendon, allowing for proper function without pain. When the tendon and its lining become inflamed, the tendon can no longer glide smoothly, causing pain. The proximal biceps tendon connects the biceps muscle to two bones of the shoulder. It is important for proper function of the elbow and turning the palm upward (supination) using the wrist. Proximal biceps tendon tendinitis may include a grade 1 or 2 strain of the tendon. Grade 1 strains involve a slight pull of the tendon without signs of tearing and no observed tendon lengthening. There is also no loss of strength. Grade 2 strains involve small tears in the tendon fibers. The tendon or muscle is stretched and strength is usually decreased.  SYMPTOMS   Pain, tenderness, swelling, warmth, or redness over the front of the shoulder.  Pain that gets worse with shoulder and elbow use, especially against resistance.  Limited motion of the shoulder or elbow.  Crackling sound (crepitation) when the tendon or shoulder is moved or touched. CAUSES  The symptoms of biceps tendonitis are due to inflammation of the tendon. Inflammation may be caused by:  Strain from sudden increase in amount or intensity of activity.  Direct blow or injury to the elbow (uncommon).  Overuse or repetitive elbow bending or wrist rotation, particularly when turning the palm up, or with elbow hyperextension. RISK INCREASES WITH:  Sports that involve contact or overhead arm activity (throwing sports, gymnastics, weightlifting, bodybuilding, rock climbing).  Heavy labor.  Poor strength and  flexibility.  Failure to warm up properly before activity. PREVENTION  Warm up and stretch properly before activity.  Allow time for recovery between activities.  Maintain physical fitness:  Strength, flexibility, and endurance.  Cardiovascular fitness.  Learn and use proper exercise technique. PROGNOSIS  With proper treatment, proximal biceps tendon tendonitis and tenosynovitis is usually curable within 6 weeks. Healing is usually quicker if the cause was a direct blow, not overuse.  RELATED COMPLICATIONS   Longer healing time if not properly treated or if not given enough time to heal.  Chronically inflamed tendon that causes persistent pain with activity, that may progress to constant pain and potentially rupture of the tendon.  Recurring symptoms, especially if activity is resumed too soon or with overuse, a direct blow, or use of poor exercise technique. TREATMENT Treatment first involves ice and medicine, to reduce pain and inflammation. It is helpful to modify activities that cause pain, to reduce the chances of causing the condition to get worse. Strengthening and stretching exercises should be performed to promote proper use of the muscles of the shoulder. These exercises may be performed at home or with a therapist. Other treatments may be given such as ultrasound or heat therapy. A corticosteroid injection may be recommended to help reduce inflammation of the tendon lining. Surgery is usually not necessary. Sometimes, if symptoms last for greater than 6 months, surgery will be advised to detach the tendon and re-insert it into the arm bone. Surgery to correct other shoulder problems that may be contributing to tendinitis may be advised before surgery for the tendinitis itself.  MEDICATION  If pain medicine is needed, nonsteroidal anti-inflammatory medicines (aspirin and ibuprofen), or other minor pain relievers (  acetaminophen), are often advised.  Do not take pain medicine  for 7 days before surgery.  Prescription pain relievers may be given if your caregiver thinks they are needed. Use only as directed and only as much as you need.  Corticosteroid injections may be given. These injections should only be used on the most severe cases, as one can only receive a limited number of them. HEAT AND COLD   Cold treatment (icing) should be applied for 10 to 15 minutes every 2 to 3 hours for inflammation and pain, and immediately after activity that aggravates your symptoms. Use ice packs or an ice massage.  Heat treatment may be used before performing stretching and strengthening activities prescribed by your caregiver, physical therapist, or athletic trainer. Use a heat pack or a warm water soak. SEEK MEDICAL CARE IF:   Symptoms get worse or do not improve in 2 weeks, despite treatment.  New, unexplained symptoms develop. (Drugs used in treatment may produce side effects.) EXERCISES RANGE OF MOTION (ROM) AND EXERCISES - Biceps Tendon (Proximal) and Tenosynovitis These exercises may help you when beginning to rehabilitate your injury. Your symptoms may go away with or without further involvement from your physician, physical therapist, or athletic trainer. While completing these exercises, remember:   Restoring tissue flexibility helps normal motion to return to the joints. This allows healthier, less painful movement and activity.  An effective stretch should be held for at least 30 seconds.  A stretch should never be painful. You should only feel a gentle lengthening or release in the stretched tissue. STRETCH  Flexion, Standing  Stand with good posture. With an underhand grip on your right / left hand and an overhand grip on the opposite hand, grasp a broomstick or cane so that your hands are a little more than shoulder width apart.  Keeping your right / left elbow straight and shoulder muscles relaxed, push the stick with your opposite hand to raise your right /  left arm in front of your body and then overhead. Raise your arm until you feel a stretch in your right / left shoulder, but before you have increased shoulder pain.  Try to avoid shrugging your right / left shoulder as your arm rises, by keeping your shoulder blade tucked down and toward your mid-back spine. Hold for __________ seconds.  Slowly return to the starting position. Repeat __________ times. Complete this exercise __________ times per day. STRETCH  Abduction, Supine  Lie on your back. With an underhand grip on your right / left hand and an overhand grip on the opposite hand, grasp a broomstick or cane so that your hands are a little more than shoulder width apart.  Keeping your right / left elbow straight and shoulder muscles relaxed, push the stick with your opposite hand to raise your right / left arm out to the side of your body and then overhead. Raise your arm until you feel a stretch in your right / left shoulder, but before you have increased shoulder pain.  Try to avoid shrugging your right / left shoulder as your arm rises, by keeping your shoulder blade tucked down and toward your mid-back spine. Hold for __________ seconds.  Slowly return to the starting position. Repeat __________ times. Complete this exercise __________ times per day. ROM  Flexion, Active-Assisted  Lie on your back. You may bend your knees for comfort.  Grasp a broomstick or cane so your hands are about shoulder width apart. Your right / left hand should   grip the end of the stick so that your hand is positioned "thumbs-up," as if you were about to shake hands.  Using your healthy arm to lead, raise your right / left arm overhead until you feel a gentle stretch in your shoulder. Hold for __________ seconds.  Use the stick to assist in returning your right / left arm to its starting position. Repeat __________ times. Complete this exercise __________ times per day.  STRETCH  Flexion, Standing   Stand  facing a wall. Walk your right / left fingers up the wall until you feel a moderate stretch in your shoulder. As your hand gets higher, you may need to step closer to the wall or use a door frame to walk through.  Try to avoid shrugging your right / left shoulder as your arm rises, by keeping your shoulder blade tucked down and toward your mid-back spine.  Hold for __________ seconds. Use your other hand, if needed, to ease out of the stretch and return to the starting position. Repeat __________ times. Complete this exercise __________ times per day.  ROM - Internal Rotation   Using underhand grips, grasp a stick behind your back with both hands.  While standing upright with good posture, slide the stick up your back until you feel a mild stretch in the front of your shoulder.  Hold for __________ seconds. Slowly return to your starting position. Repeat __________ times. Complete this exercise __________ times per day.  STRETCH - Internal Rotation  Place your right / left hand behind your back, palm-up.  Throw a towel or belt over your opposite shoulder. Grasp the towel with your right / left hand.  While keeping an upright posture, gently pull up on the towel until you feel a stretch in the front of your right / left shoulder.  Avoid shrugging your right / left shoulder as your arm rises, by keeping your shoulder blade tucked down and toward your mid-back spine.  Hold for __________ seconds. Release the stretch by lowering your opposite hand. Repeat __________ times. Complete this exercise __________ times per day. STRENGTHENING EXERCISES - Biceps Tendon Tendinitis (Proximal) and Tenosynovitis These exercises may help you regain your strength after your physician has discontinued your restraint in a cast or brace. They may resolve your symptoms with or without further involvement from your physician, physical therapist or athletic trainer. While completing these exercises, remember:    Muscles can gain both the endurance and the strength needed for everyday activities through controlled exercises.  Complete these exercises as instructed by your physician, physical therapist or athletic trainer. Increase the resistance and repetitions only as guided.  You may experience muscle soreness or fatigue, but the pain or discomfort you are trying to eliminate should never worsen during these exercises. If this pain does get worse, stop and make sure you are following the directions exactly. If the pain is still present after adjustments, discontinue the exercise until you can discuss the trouble with your caregiver. STRENGTH - Elbow Flexors, Isometric  Stand or sit upright on a firm surface. Place your right / left arm so that your hand is palm-up and at the height of your waist.  Place your opposite hand on top of your forearm. Gently push down as your right / left arm resists. Push as hard as you can with both arms, without causing any pain or movement at your right / left elbow. Hold this stationary position for __________ seconds.  Gradually release the tension in both   arms. Allow your muscles to relax completely before repeating. Repeat __________ times. Complete this exercise __________ times per day. STRENGTH - Shoulder Flexion, Isometric  With good posture and facing a wall, stand or sit about 4-6 inches away.  Keeping your right / left elbow straight, gently press the top of your fist into the wall. Increase the pressure gradually until you are pressing as hard as you can, without shrugging your shoulder or increasing any shoulder discomfort.  Hold for __________ seconds.  Release the tension slowly. Relax your shoulder muscles completely before you start the next repetition. Repeat __________ times. Complete this exercise __________ times per day.  STRENGTH  Elbow Flexors, Supinated  With good posture, stand or sit on a firm chair without armrests. Allow your right /  left arm to rest at your side with your palm facing forward.  Holding a __________ weight, or gripping a rubber exercise band or tubing,  bring your hand toward your shoulder.  Allow your muscles to control the resistance as your hand returns to your side. Repeat __________ times. Complete this exercise __________ times per day.  STRENGTH - Shoulder Flexion  Stand or sit with good posture. Grasp a __________ weight, or an exercise band or tubing, so that your hand is "thumbs-up," like when you shake hands.  Slowly lift your right / left arm as far as you can, without increasing any shoulder pain. At first, many people can only raise their hand to shoulder height.  Avoid shrugging your right / left shoulder as your arm rises, by keeping your shoulder blade tucked down and toward your mid-back spine.  Hold for __________ seconds. Control the descent of your hand as you slowly return to your starting position. Repeat __________ times. Complete this exercise __________ times per day. Document Released: 04/23/2005 Document Revised: 07/16/2011 Document Reviewed: 08/05/2008 [employer] Patient Information 2014 [employer].  

## 2013-05-11 NOTE — Progress Notes (Signed)
Subjective:    Patient ID: Victor [name], male    DOB: Oct 11, 1960, 53 y.o.   MRN: [id number]  HPI  Victor [name] is back regarding his chronic pain. About a month ago he injured his left shoulder while he was lifting weights, likely doing supine bench press or military press. He recalls having injured the left shoulder 20 years ago or so as well. The pain is worst at night. Due to the increased pain, he's been taking more of his oxycodone. He has also placed some heat over the shoulder also.   He has lost about 40lbs since I last saw him. He doesn't feel that the weight lost has dramatically affected his left leg/and knee pain. His pain is worst in the morning and at night in general. He feels that the more he does, the more he hurts.   He continues to see the wound care clinic for his left shin wound.    Pain Inventory Average Pain 8 Pain Right Now 8 My pain is sharp, dull, stabbing, tingling and aching  In the last 24 hours, has pain interfered with the following? General activity 8 Relation with others 8 Enjoyment of life 8 What TIME of day is your pain at its worst? morning, night Sleep (in general) Fair  Pain is worse with: walking, inactivity and standing Pain improves with: rest, heat/ice, therapy/exercise and medication Relief from Meds: 7  Mobility walk without assistance how many minutes can you walk? 20 transfers alone Do you have any goals in this area?  yes  Function disabled: date disabled na retired Do you have any goals in this area?  yes  Neuro/Psych numbness tingling  Prior Studies Any changes since last visit?  no  Physicians involved in your care Any changes since last visit?  no   Family History  Problem Relation Age of Onset  . Kidney disease Father    History   Social History  . Marital Status: Married    Spouse Name: N/A    Number of Children: N/A  . Years of Education: N/A   Social History Main Topics  . Smoking status: Never Smoker    . Smokeless tobacco: Never Used  . Alcohol Use: Yes     Comment: occasional  . Drug Use: No  . Sexual Activity: None   Other Topics Concern  . None   Social History Narrative  . None   Past Surgical History  Procedure Laterality Date  . Cataract extraction  06/2012  . Leg surgries      4 surgeries  . Retinal detachment surgery  2009   Past Medical History  Diagnosis Date  . Closed fracture of tibia, upper end   . Lesion of lateral popliteal nerve   . Primary localized osteoarthrosis, lower leg   . Primary localized osteoarthrosis, upper arm   . Hypertension   . Breast enlargement 08/28/2012  . Cardiomyopathy- nonischemic 08/28/2012    CATH 5/14 Normal CA  EF 30%  . Chronic systolic heart failure 1/60/1093   BP 132/79  Pulse 78  Resp 14  Ht 5\' 11"  (1.803 m)  Wt 261 lb 3.2 oz (118.48 kg)  BMI 36.45 kg/m2  SpO2 96%      Review of Systems  Neurological: Positive for numbness.       Tingling  All other systems reviewed and are negative.       Objective:   Physical Exam  Constitutional: He is oriented to person, place, and time. He appears  well-developed and well-nourished.  HENT:  Head: Normocephalic and atraumatic.  Eyes: Conjunctivae and EOM are normal. Pupils are equal, round, and reactive to light.  Neck: Normal range of motion.  Cardiovascular: Normal rate and regular rhythm.  Pulmonary/Chest: Effort normal and breath sounds normal.  Abdominal: Soft. Bowel sounds are normal.  Musculoskeletal: Normal range of motion.   Deformities persistent aroound the left leg. Right knee is tender with weight bearing and ROM. He is antalgic with weight bearing. Left shoulder RTC maneuvers were equivocal. Right short head biceps tendon was tender to touch. Speeds test was positive. Cross armed maneuver and labral testing was negative. Neurological: He is alert and oriented to person, place, and time.  Chronic changes in left leg. Persistent weakness and limtied rom in  the left ankle.  Skin: Skin is warm.  Psychiatric: He has a normal mood and affect. His behavior is normal. Judgment and thought content normal.    Assessment & Plan:   ASSESSMENT:  1. Left lower extremity trauma with tibial plateau fracture and distal  femur fracture with multifactorial pain and arthritis at the joint.  2. Left Peroneal nerve injury.  3. Left biceps tendonitis (short head)    PLAN:  1.Will try conservative mgt of the left biceps tendon, short head. Apply ice, perform appropriate stretching, relative rest (He can't continue to weight lift with this injury while it's acutely inflamed!). Consider steroid injection if pain is persistent. Exercises were provided.  2. Continue lyrica 150mg  t.i.d. for his neuropathic pain.  pain. This has helped quite a bit. He may continue with meloxicam and use this prn.  3  Continue Oxycodone 15 mg 1 q.8 h. p.r.n., #90. May need to try long acting agent again.  4. left custom compression stocking/ leg sleeveb.  5. He will need to explore sedentary work options if he hopes to return to the work force.  6. F/u in about 4 weeks. 30 minutes of face to face patient care time were spent during this visit. All questions were encouraged and answered.

## 2013-05-12 ENCOUNTER — Telehealth: Payer: Self-pay

## 2013-05-12 ENCOUNTER — Other Ambulatory Visit: Payer: Self-pay

## 2013-05-12 MED ORDER — SILDENAFIL CITRATE 50 MG PO TABS: 50.0000 mg | ORAL_TABLET | Freq: Every day | ORAL | Status: DC | PRN

## 2013-05-12 NOTE — Telephone Encounter (Signed)
Patient was given a RX for oxycodone yesterday at his [facility]. He took the prescription to the prescription to be filled and they would not fill it because he had a RX filled on 05/05/13. The prescription said do not fill before 05/11/13 per patient. Patient was wondering if it was it meant to say 05/11/13?

## 2013-05-12 NOTE — Telephone Encounter (Signed)
He has used more of the oxycodone because of the left shoulder which has really bothered him. I gave him another rx which he could fill earlier--the date is correct---that's why I put it on there. Think I meant to write "may fill today, however."  If it's a problem, I will write another rx with a date later in the month. We discussed the potential of starting a long acting agent as well at next visit.

## 2013-05-13 ENCOUNTER — Encounter (HOSPITAL_BASED_OUTPATIENT_CLINIC_OR_DEPARTMENT_OTHER): Payer: Medicare Other | Attending: General Surgery

## 2013-05-13 DIAGNOSIS — T8189XA Other complications of procedures, not elsewhere classified, initial encounter: Secondary | ICD-10-CM | POA: Diagnosis not present

## 2013-05-13 DIAGNOSIS — L97909 Non-pressure chronic ulcer of unspecified part of unspecified lower leg with unspecified severity: Secondary | ICD-10-CM | POA: Diagnosis not present

## 2013-05-13 DIAGNOSIS — I87339 Chronic venous hypertension (idiopathic) with ulcer and inflammation of unspecified lower extremity: Secondary | ICD-10-CM | POA: Diagnosis not present

## 2013-05-13 DIAGNOSIS — Y838 Other surgical procedures as the cause of abnormal reaction of the patient, or of later complication, without mention of misadventure at the time of the procedure: Secondary | ICD-10-CM | POA: Insufficient documentation

## 2013-05-13 NOTE — Telephone Encounter (Signed)
Contacted patient to let him know that the RX had the correct date on it. Patient was going to take the RX back to the pharmacy and if they had any questions they are to contact us. The oxycodone prescription was meant to be filled on 05/11/13 per Dr. Naaman Plummer.

## 2013-05-20 ENCOUNTER — Ambulatory Visit: Payer: Medicare Other | Admitting: Physical Medicine & Rehabilitation

## 2013-05-27 DIAGNOSIS — L97909 Non-pressure chronic ulcer of unspecified part of unspecified lower leg with unspecified severity: Secondary | ICD-10-CM | POA: Diagnosis not present

## 2013-05-27 DIAGNOSIS — T8189XA Other complications of procedures, not elsewhere classified, initial encounter: Secondary | ICD-10-CM | POA: Diagnosis not present

## 2013-05-27 DIAGNOSIS — I87339 Chronic venous hypertension (idiopathic) with ulcer and inflammation of unspecified lower extremity: Secondary | ICD-10-CM | POA: Diagnosis not present

## 2013-05-28 ENCOUNTER — Ambulatory Visit: Payer: Medicare Other | Admitting: Physical Medicine and Rehabilitation

## 2013-05-29 ENCOUNTER — Other Ambulatory Visit: Payer: Self-pay

## 2013-05-29 ENCOUNTER — Telehealth: Payer: Self-pay

## 2013-05-29 MED ORDER — PREDNISONE 20 MG PO TABS: 20.0000 mg | ORAL_TABLET | ORAL | Status: DC

## 2013-05-29 NOTE — Telephone Encounter (Signed)
Prednisone called in to Pharmacy. Patient notified.

## 2013-05-29 NOTE — Telephone Encounter (Signed)
Prednisone RX call in to pharmacy. Patient notified.

## 2013-05-29 NOTE — Telephone Encounter (Signed)
Prednisone 20mg : Take 1 tab 3x daily for 4 days then 1 tab 2x daily for 4 days then 1 tab once daily for 4 days, then 1/2 tab daily for 4 days and off.  #26 0RF

## 2013-05-29 NOTE — Telephone Encounter (Signed)
Patient is requesting a refill on Prednisone.

## 2013-06-03 DIAGNOSIS — I87339 Chronic venous hypertension (idiopathic) with ulcer and inflammation of unspecified lower extremity: Secondary | ICD-10-CM | POA: Diagnosis not present

## 2013-06-03 DIAGNOSIS — T8189XA Other complications of procedures, not elsewhere classified, initial encounter: Secondary | ICD-10-CM | POA: Diagnosis not present

## 2013-06-09 ENCOUNTER — Encounter: Payer: Medicare Other | Attending: Physical Medicine and Rehabilitation | Admitting: Physical Medicine & Rehabilitation

## 2013-06-09 ENCOUNTER — Encounter: Payer: Self-pay | Admitting: Physical Medicine & Rehabilitation

## 2013-06-09 VITALS — BP 133/78 | HR 69 | Resp 14 | Ht 71.0 in | Wt 263.0 lb

## 2013-06-09 DIAGNOSIS — M722 Plantar fascial fibromatosis: Secondary | ICD-10-CM | POA: Insufficient documentation

## 2013-06-09 DIAGNOSIS — M7522 Bicipital tendinitis, left shoulder: Secondary | ICD-10-CM

## 2013-06-09 DIAGNOSIS — M1732 Unilateral post-traumatic osteoarthritis, left knee: Secondary | ICD-10-CM

## 2013-06-09 DIAGNOSIS — M175 Other unilateral secondary osteoarthritis of knee: Secondary | ICD-10-CM | POA: Diagnosis not present

## 2013-06-09 DIAGNOSIS — M7520 Bicipital tendinitis, unspecified shoulder: Secondary | ICD-10-CM | POA: Diagnosis not present

## 2013-06-09 DIAGNOSIS — S8410XA Injury of peroneal nerve at lower leg level, unspecified leg, initial encounter: Secondary | ICD-10-CM | POA: Insufficient documentation

## 2013-06-09 DIAGNOSIS — X58XXXA Exposure to other specified factors, initial encounter: Secondary | ICD-10-CM | POA: Insufficient documentation

## 2013-06-09 MED ORDER — OXYCODONE HCL 15 MG PO TABS: 15.0000 mg | ORAL_TABLET | Freq: Four times a day (QID) | ORAL | Status: DC | PRN

## 2013-06-09 NOTE — Progress Notes (Signed)
Subjective:    Patient ID: Victor [name], male    DOB: 10/16/1960, 53 y.o.   MRN: [id number]  HPI  Victor [name] is back regarding his chronic left leg pain. His pain levels have been fairly manageable for the most part. His shoulder has been improving with the prednisone and after he laid off his work out program as well.   He is finishing up at the wound care center. He may have his last appt tomorrow. The coban wraps help his pain and support for the leg, and he would like to use these after his wound is completely healed.   He continues with the oxycodone for pain control. He forgot to bring his bottle with him again today.       Pain Inventory Average Pain 8 Pain Right Now 7 My pain is sharp, dull, tingling and aching  In the last 24 hours, has pain interfered with the following? General activity 8 Relation with others 8 Enjoyment of life 8 What TIME of day is your pain at its worst? morning and night Sleep (in general) Fair  Pain is worse with: walking, standing and some activites Pain improves with: therapy/exercise and medication Relief from Meds: 8  Mobility walk without assistance how many minutes can you walk? 20 Do you have any goals in this area?  yes  Function Do you have any goals in this area?  no  Neuro/Psych No problems in this area  Prior Studies Any changes since last visit?  no  Physicians involved in your care Any changes since last visit?  no   Family History  Problem Relation Age of Onset  . Kidney disease Father    History   Social History  . Marital Status: Married    Spouse Name: N/A    Number of Children: N/A  . Years of Education: N/A   Social History Main Topics  . Smoking status: Never Smoker   . Smokeless tobacco: Never Used  . Alcohol Use: Yes     Comment: occasional  . Drug Use: No  . Sexual Activity: None   Other Topics Concern  . None   Social History Narrative  . None   Past Surgical History  Procedure  Laterality Date  . Cataract extraction  06/2012  . Leg surgries      4 surgeries  . Retinal detachment surgery  2009   Past Medical History  Diagnosis Date  . Closed fracture of tibia, upper end   . Lesion of lateral popliteal nerve   . Primary localized osteoarthrosis, lower leg   . Primary localized osteoarthrosis, upper arm   . Hypertension   . Breast enlargement 08/28/2012  . Cardiomyopathy- nonischemic 08/28/2012    CATH 5/14 Normal CA  EF 30%  . Chronic systolic heart failure 7/32/2025   BP 133/78  Pulse 69  Resp 14  Ht 5\' 11"  (1.803 m)  Wt 263 lb (119.296 kg)  BMI 36.70 kg/m2  SpO2 96%  Opioid Risk Score:   Fall Risk Score: Low Fall Risk (0-5 points) (patient educated handout declined)   Review of Systems  Musculoskeletal: Positive for gait problem.  All other systems reviewed and are negative.       Objective:   Physical Exam  Constitutional: He is oriented to person, place, and time. He appears well-developed and well-nourished.  HENT:  Head: Normocephalic and atraumatic.  Eyes: Conjunctivae and EOM are normal. Pupils are equal, round, and reactive to light.  Neck: Normal range of  motion.  Cardiovascular: Normal rate and regular rhythm.  Pulmonary/Chest: Effort normal and breath sounds normal.  Abdominal: Soft. Bowel sounds are normal.  Musculoskeletal: Normal range of motion.  Deformities persistent aroound the left leg. Right knee is tender with weight bearing and ROM. He is antalgic with weight bearing. Left shoulder RTC maneuvers were equivocal. Right short head biceps tendon was tender to touch. Speeds test was positive. Cross armed maneuver and labral testing was negative. Neurological: He is alert and oriented to person, place, and time.  Chronic changes in left leg. Persistent weakness and limtied rom in the left ankle.  Skin: Skin is warm.  Psychiatric: He has a normal mood and affect. His behavior is normal. Judgment and thought content normal.    Assessment & Plan:   ASSESSMENT:  1. Left lower extremity trauma with tibial plateau fracture and distal  femur fracture with multifactorial pain and arthritis at the joint.  2. Left Peroneal nerve injury.  3. Left biceps tendonitis (short head)   PLAN:  1. Common sense with weight lifting and exercise was recommended for the left shoulder.  2. Continue lyrica 150mg  t.i.d. for his neuropathic pain.  pain. This has helped quite a bit. He may continue with meloxicam and use this prn.  3  Continue Oxycodone 15 mg 1 q.8 h. p.r.n., #90. .  4. left custom compression stocking/ leg sleeve. He can try coban wrap as well after wound care is complete. 6. F/u in about 4 weeks. 30 minutes of face to face patient care time were spent during this visit. All questions were encouraged and answered.  Spoke to the patient about the fact that he needs to bring his narcotic pill bottles in to the office if he expects to receive refills. He expressed an understanding.

## 2013-06-09 NOTE — Patient Instructions (Signed)
Coban wrap you can purchase on line   You must bring in your bottle to receive your oxycodone refill.

## 2013-06-10 ENCOUNTER — Encounter (HOSPITAL_BASED_OUTPATIENT_CLINIC_OR_DEPARTMENT_OTHER): Payer: Medicare Other | Attending: General Surgery

## 2013-06-10 DIAGNOSIS — L97909 Non-pressure chronic ulcer of unspecified part of unspecified lower leg with unspecified severity: Principal | ICD-10-CM

## 2013-06-10 DIAGNOSIS — I87339 Chronic venous hypertension (idiopathic) with ulcer and inflammation of unspecified lower extremity: Secondary | ICD-10-CM | POA: Diagnosis not present

## 2013-06-10 DIAGNOSIS — T8189XA Other complications of procedures, not elsewhere classified, initial encounter: Secondary | ICD-10-CM | POA: Diagnosis not present

## 2013-06-10 DIAGNOSIS — Y838 Other surgical procedures as the cause of abnormal reaction of the patient, or of later complication, without mention of misadventure at the time of the procedure: Secondary | ICD-10-CM | POA: Insufficient documentation

## 2013-06-17 ENCOUNTER — Encounter: Payer: Self-pay | Admitting: Interventional Cardiology

## 2013-06-17 DIAGNOSIS — T8189XA Other complications of procedures, not elsewhere classified, initial encounter: Secondary | ICD-10-CM | POA: Diagnosis not present

## 2013-06-17 DIAGNOSIS — L97909 Non-pressure chronic ulcer of unspecified part of unspecified lower leg with unspecified severity: Secondary | ICD-10-CM | POA: Diagnosis not present

## 2013-06-17 DIAGNOSIS — I87339 Chronic venous hypertension (idiopathic) with ulcer and inflammation of unspecified lower extremity: Secondary | ICD-10-CM | POA: Diagnosis not present

## 2013-06-17 DIAGNOSIS — T07XXXA Unspecified multiple injuries, initial encounter: Secondary | ICD-10-CM | POA: Diagnosis not present

## 2013-06-19 ENCOUNTER — Ambulatory Visit (INDEPENDENT_AMBULATORY_CARE_PROVIDER_SITE_OTHER): Payer: Medicare Other | Admitting: Interventional Cardiology

## 2013-06-19 ENCOUNTER — Encounter: Payer: Self-pay | Admitting: Interventional Cardiology

## 2013-06-19 VITALS — BP 116/82 | HR 76 | Ht 72.0 in | Wt 264.0 lb

## 2013-06-19 DIAGNOSIS — I429 Cardiomyopathy, unspecified: Secondary | ICD-10-CM

## 2013-06-19 DIAGNOSIS — I428 Other cardiomyopathies: Secondary | ICD-10-CM
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
## 2013-06-19 NOTE — Patient Instructions (Signed)
Your physician recommends that you continue on your current medications as directed. Please refer to the Current Medication list given to you today.  Your physician wants you to follow-up in: 6 months with Dr. Varanasi.  You will receive a reminder letter in the mail two months in advance. If you don't receive a letter, please call our office to schedule the follow-up appointment.  

## 2013-07-01 DIAGNOSIS — T8189XA Other complications of procedures, not elsewhere classified, initial encounter: Secondary | ICD-10-CM | POA: Diagnosis not present

## 2013-07-01 DIAGNOSIS — I87339 Chronic venous hypertension (idiopathic) with ulcer and inflammation of unspecified lower extremity: Secondary | ICD-10-CM | POA: Diagnosis not present

## 2013-07-03 ENCOUNTER — Telehealth: Payer: Self-pay | Admitting: *Deleted

## 2013-07-03 ENCOUNTER — Other Ambulatory Visit: Payer: Self-pay | Admitting: *Deleted

## 2013-07-03 DIAGNOSIS — M722 Plantar fascial fibromatosis: Secondary | ICD-10-CM

## 2013-07-03 DIAGNOSIS — S8410XA Injury of peroneal nerve at lower leg level, unspecified leg, initial encounter: Secondary | ICD-10-CM

## 2013-07-03 DIAGNOSIS — M1732 Unilateral post-traumatic osteoarthritis, left knee: Secondary | ICD-10-CM

## 2013-07-03 DIAGNOSIS — M7522 Bicipital tendinitis, left shoulder: Secondary | ICD-10-CM

## 2013-07-03 MED ORDER — OXYCODONE HCL 15 MG PO TABS: 15.0000 mg | ORAL_TABLET | Freq: Four times a day (QID) | ORAL | Status: DC | PRN

## 2013-07-03 NOTE — Telephone Encounter (Signed)
RX printed early for controlled medication for the visit with RN on 07/06/13 (to be signed by MD) 

## 2013-07-03 NOTE — Telephone Encounter (Signed)
Erroneous encounter

## 2013-07-06 ENCOUNTER — Other Ambulatory Visit (HOSPITAL_COMMUNITY): Payer: Self-pay | Admitting: General Surgery

## 2013-07-06 ENCOUNTER — Ambulatory Visit (HOSPITAL_COMMUNITY)
Admission: RE | Admit: 2013-07-06 | Discharge: 2013-07-06 | Disposition: A | Discharge status: Home / Self Care | Payer: Medicare Other | Source: Ambulatory Visit | Attending: General Surgery | Admitting: General Surgery

## 2013-07-06 ENCOUNTER — Encounter: Payer: Medicare Other | Attending: Physical Medicine & Rehabilitation | Admitting: *Deleted

## 2013-07-06 ENCOUNTER — Encounter: Payer: Self-pay | Admitting: *Deleted

## 2013-07-06 VITALS — BP 120/69 | HR 88 | Resp 14 | Wt 264.4 lb

## 2013-07-06 DIAGNOSIS — M19029 Primary osteoarthritis, unspecified elbow: Secondary | ICD-10-CM | POA: Diagnosis not present

## 2013-07-06 DIAGNOSIS — T8189XA Other complications of procedures, not elsewhere classified, initial encounter: Secondary | ICD-10-CM | POA: Diagnosis not present

## 2013-07-06 DIAGNOSIS — I428 Other cardiomyopathies: Secondary | ICD-10-CM | POA: Insufficient documentation

## 2013-07-06 DIAGNOSIS — I5022 Chronic systolic (congestive) heart failure: Secondary | ICD-10-CM | POA: Diagnosis not present

## 2013-07-06 DIAGNOSIS — IMO0002 Reserved for concepts with insufficient information to code with codable children: Secondary | ICD-10-CM | POA: Diagnosis not present

## 2013-07-06 DIAGNOSIS — Z4789 Encounter for other orthopedic aftercare: Secondary | ICD-10-CM | POA: Diagnosis not present

## 2013-07-06 DIAGNOSIS — M1732 Unilateral post-traumatic osteoarthritis, left knee: Secondary | ICD-10-CM

## 2013-07-06 DIAGNOSIS — S81809A Unspecified open wound, unspecified lower leg, initial encounter: Secondary | ICD-10-CM | POA: Diagnosis not present

## 2013-07-06 DIAGNOSIS — M7520 Bicipital tendinitis, unspecified shoulder: Secondary | ICD-10-CM | POA: Insufficient documentation

## 2013-07-06 DIAGNOSIS — M25569 Pain in unspecified knee: Secondary | ICD-10-CM | POA: Diagnosis not present

## 2013-07-06 DIAGNOSIS — M1710 Unilateral primary osteoarthritis, unspecified knee: Secondary | ICD-10-CM | POA: Insufficient documentation

## 2013-07-06 DIAGNOSIS — I10 Essential (primary) hypertension: Secondary | ICD-10-CM | POA: Insufficient documentation

## 2013-07-06 DIAGNOSIS — S81009A Unspecified open wound, unspecified knee, initial encounter: Secondary | ICD-10-CM | POA: Diagnosis not present

## 2013-07-06 DIAGNOSIS — Y838 Other surgical procedures as the cause of abnormal reaction of the patient, or of later complication, without mention of misadventure at the time of the procedure: Secondary | ICD-10-CM | POA: Insufficient documentation

## 2013-07-06 DIAGNOSIS — L98499 Non-pressure chronic ulcer of skin of other sites with unspecified severity: Secondary | ICD-10-CM

## 2013-07-06 NOTE — Patient Instructions (Signed)
Follow up one month with RN for med refill and two months with Dr Naaman Plummer.

## 2013-07-06 NOTE — Progress Notes (Signed)
Here for pill count and medication refills. Did not bring current bottle today.  Must have at next visit to get rx.    VSS    Pain level:8  Has not had any falls and is low risk.  Educated and handout given for fall prevention in home.  Refill given for his oxycodone 15 mg #90.  Return in one month for RN med refill an dtwo months with Dr Naaman Plummer

## 2013-07-08 ENCOUNTER — Encounter (HOSPITAL_BASED_OUTPATIENT_CLINIC_OR_DEPARTMENT_OTHER): Payer: Medicare Other | Attending: General Surgery

## 2013-07-08 DIAGNOSIS — T8189XA Other complications of procedures, not elsewhere classified, initial encounter: Secondary | ICD-10-CM | POA: Diagnosis not present

## 2013-07-08 DIAGNOSIS — Y838 Other surgical procedures as the cause of abnormal reaction of the patient, or of later complication, without mention of misadventure at the time of the procedure: Secondary | ICD-10-CM | POA: Insufficient documentation

## 2013-07-08 DIAGNOSIS — M86669 Other chronic osteomyelitis, unspecified tibia and fibula: Secondary | ICD-10-CM | POA: Insufficient documentation

## 2013-07-09 ENCOUNTER — Other Ambulatory Visit: Payer: Self-pay | Admitting: Cardiology

## 2013-07-09 DIAGNOSIS — Z79899 Other long term (current) drug therapy: Secondary | ICD-10-CM

## 2013-07-15 ENCOUNTER — Other Ambulatory Visit (HOSPITAL_COMMUNITY): Payer: Self-pay | Admitting: General Surgery

## 2013-07-15 ENCOUNTER — Ambulatory Visit (HOSPITAL_COMMUNITY)
Admission: RE | Admit: 2013-07-15 | Discharge: 2013-07-15 | Disposition: A | Discharge status: Home / Self Care | Payer: Medicare Other | Source: Ambulatory Visit | Attending: General Surgery | Admitting: General Surgery

## 2013-07-15 DIAGNOSIS — M86669 Other chronic osteomyelitis, unspecified tibia and fibula: Secondary | ICD-10-CM | POA: Diagnosis not present

## 2013-07-15 DIAGNOSIS — L97809 Non-pressure chronic ulcer of other part of unspecified lower leg with unspecified severity: Secondary | ICD-10-CM | POA: Diagnosis not present

## 2013-07-15 DIAGNOSIS — T8189XA Other complications of procedures, not elsewhere classified, initial encounter: Secondary | ICD-10-CM | POA: Diagnosis not present

## 2013-07-15 DIAGNOSIS — L97909 Non-pressure chronic ulcer of unspecified part of unspecified lower leg with unspecified severity: Secondary | ICD-10-CM | POA: Insufficient documentation

## 2013-07-15 DIAGNOSIS — L98499 Non-pressure chronic ulcer of skin of other sites with unspecified severity: Secondary | ICD-10-CM

## 2013-07-21 ENCOUNTER — Other Ambulatory Visit: Payer: Self-pay

## 2013-07-21 MED ORDER — DICLOFENAC SODIUM 1 % TD GEL: 2.0000 g | Freq: Four times a day (QID) | TRANSDERMAL | Status: DC

## 2013-07-22 ENCOUNTER — Other Ambulatory Visit (HOSPITAL_COMMUNITY): Payer: Self-pay | Admitting: General Surgery

## 2013-07-22 ENCOUNTER — Ambulatory Visit (HOSPITAL_COMMUNITY)
Admission: RE | Admit: 2013-07-22 | Discharge: 2013-07-22 | Disposition: A | Discharge status: Home / Self Care | Payer: Medicare Other | Source: Ambulatory Visit | Attending: General Surgery | Admitting: General Surgery

## 2013-07-22 ENCOUNTER — Other Ambulatory Visit: Payer: Self-pay | Admitting: Interventional Cardiology

## 2013-07-22 DIAGNOSIS — L97809 Non-pressure chronic ulcer of other part of unspecified lower leg with unspecified severity: Secondary | ICD-10-CM | POA: Diagnosis not present

## 2013-07-22 DIAGNOSIS — L98499 Non-pressure chronic ulcer of skin of other sites with unspecified severity: Secondary | ICD-10-CM

## 2013-07-22 DIAGNOSIS — R0989 Other specified symptoms and signs involving the circulatory and respiratory systems: Secondary | ICD-10-CM | POA: Diagnosis not present

## 2013-07-22 DIAGNOSIS — T8189XA Other complications of procedures, not elsewhere classified, initial encounter: Secondary | ICD-10-CM | POA: Diagnosis not present

## 2013-07-22 DIAGNOSIS — M86669 Other chronic osteomyelitis, unspecified tibia and fibula: Secondary | ICD-10-CM | POA: Diagnosis not present

## 2013-07-26 ENCOUNTER — Other Ambulatory Visit: Payer: Self-pay | Admitting: *Deleted

## 2013-07-26 DIAGNOSIS — S8410XA Injury of peroneal nerve at lower leg level, unspecified leg, initial encounter: Secondary | ICD-10-CM

## 2013-07-26 DIAGNOSIS — M722 Plantar fascial fibromatosis: Secondary | ICD-10-CM

## 2013-07-26 DIAGNOSIS — M1732 Unilateral post-traumatic osteoarthritis, left knee: Secondary | ICD-10-CM

## 2013-07-26 DIAGNOSIS — M7522 Bicipital tendinitis, left shoulder: Secondary | ICD-10-CM

## 2013-07-26 MED ORDER — OXYCODONE HCL 15 MG PO TABS: 15.0000 mg | ORAL_TABLET | Freq: Four times a day (QID) | ORAL | Status: DC | PRN

## 2013-07-26 NOTE — Telephone Encounter (Signed)
RX printed for MD to sign for RN visit 07/31/13 

## 2013-07-28 ENCOUNTER — Other Ambulatory Visit (HOSPITAL_COMMUNITY): Payer: Self-pay | Admitting: Radiology

## 2013-07-28 ENCOUNTER — Ambulatory Visit (HOSPITAL_COMMUNITY): Payer: Medicare Other | Attending: Cardiology | Admitting: Radiology

## 2013-07-28 ENCOUNTER — Encounter: Payer: Self-pay | Admitting: Cardiology

## 2013-07-28 DIAGNOSIS — I509 Heart failure, unspecified: Secondary | ICD-10-CM

## 2013-07-28 NOTE — Progress Notes (Signed)
Echocardiogram performed.  

## 2013-07-29 DIAGNOSIS — M86669 Other chronic osteomyelitis, unspecified tibia and fibula: Secondary | ICD-10-CM | POA: Diagnosis not present

## 2013-07-29 DIAGNOSIS — T8189XA Other complications of procedures, not elsewhere classified, initial encounter: Secondary | ICD-10-CM | POA: Diagnosis not present

## 2013-07-30 ENCOUNTER — Telehealth: Payer: Self-pay

## 2013-07-30 ENCOUNTER — Other Ambulatory Visit (HOSPITAL_COMMUNITY): Payer: Self-pay | Admitting: General Surgery

## 2013-07-30 ENCOUNTER — Ambulatory Visit (HOSPITAL_COMMUNITY)
Admission: RE | Admit: 2013-07-30 | Discharge: 2013-07-30 | Disposition: A | Discharge status: Home / Self Care | Payer: Medicare Other | Source: Ambulatory Visit | Attending: General Surgery | Admitting: General Surgery

## 2013-07-30 DIAGNOSIS — M869 Osteomyelitis, unspecified: Secondary | ICD-10-CM | POA: Insufficient documentation

## 2013-07-30 DIAGNOSIS — Z77018 Contact with and (suspected) exposure to other hazardous metals: Secondary | ICD-10-CM | POA: Diagnosis not present

## 2013-07-30 DIAGNOSIS — L98499 Non-pressure chronic ulcer of skin of other sites with unspecified severity: Secondary | ICD-10-CM

## 2013-07-30 DIAGNOSIS — Z1389 Encounter for screening for other disorder: Secondary | ICD-10-CM | POA: Diagnosis not present

## 2013-07-30 DIAGNOSIS — Z1810 Retained metal fragments, unspecified: Secondary | ICD-10-CM | POA: Insufficient documentation

## 2013-07-30 DIAGNOSIS — L03119 Cellulitis of unspecified part of limb: Principal | ICD-10-CM

## 2013-07-30 DIAGNOSIS — L97909 Non-pressure chronic ulcer of unspecified part of unspecified lower leg with unspecified severity: Secondary | ICD-10-CM | POA: Diagnosis not present

## 2013-07-30 DIAGNOSIS — L02419 Cutaneous abscess of limb, unspecified: Secondary | ICD-10-CM | POA: Diagnosis not present

## 2013-07-30 DIAGNOSIS — S81009A Unspecified open wound, unspecified knee, initial encounter: Secondary | ICD-10-CM | POA: Diagnosis not present

## 2013-07-30 DIAGNOSIS — Z135 Encounter for screening for eye and ear disorders: Secondary | ICD-10-CM | POA: Diagnosis not present

## 2013-07-30 DIAGNOSIS — M722 Plantar fascial fibromatosis: Secondary | ICD-10-CM

## 2013-07-30 DIAGNOSIS — M1732 Unilateral post-traumatic osteoarthritis, left knee: Secondary | ICD-10-CM

## 2013-07-30 MED ORDER — GADOBENATE DIMEGLUMINE 529 MG/ML IV SOLN
20.0000 mL | Freq: Once | INTRAVENOUS | Status: AC | PRN
  Administered 2013-07-30: 20 mL via INTRAVENOUS

## 2013-07-30 MED ORDER — PREGABALIN 150 MG PO CAPS: 150.0000 mg | ORAL_CAPSULE | Freq: Three times a day (TID) | ORAL | Status: DC

## 2013-07-30 NOTE — Telephone Encounter (Signed)
Pharmacy sent a new RX request for Lyrica. Please advise.

## 2013-07-30 NOTE — Telephone Encounter (Signed)
rx sent

## 2013-07-31 ENCOUNTER — Encounter: Payer: Medicare Other | Admitting: *Deleted

## 2013-07-31 ENCOUNTER — Encounter: Payer: Self-pay | Admitting: *Deleted

## 2013-07-31 VITALS — BP 127/75 | HR 62 | Resp 14

## 2013-07-31 DIAGNOSIS — I5022 Chronic systolic (congestive) heart failure: Secondary | ICD-10-CM | POA: Diagnosis not present

## 2013-07-31 DIAGNOSIS — I10 Essential (primary) hypertension: Secondary | ICD-10-CM | POA: Diagnosis not present

## 2013-07-31 DIAGNOSIS — M1732 Unilateral post-traumatic osteoarthritis, left knee: Secondary | ICD-10-CM

## 2013-07-31 DIAGNOSIS — I428 Other cardiomyopathies: Secondary | ICD-10-CM | POA: Diagnosis not present

## 2013-07-31 DIAGNOSIS — M25569 Pain in unspecified knee: Secondary | ICD-10-CM | POA: Diagnosis not present

## 2013-07-31 DIAGNOSIS — Z4789 Encounter for other orthopedic aftercare: Secondary | ICD-10-CM | POA: Diagnosis not present

## 2013-07-31 DIAGNOSIS — M7520 Bicipital tendinitis, unspecified shoulder: Secondary | ICD-10-CM | POA: Diagnosis not present

## 2013-07-31 NOTE — Progress Notes (Signed)
Here for pill count and medication refills. Oxycodone 15 mg # 90 Fill date 07/06/13    Today NV#7  He should have 13 pills today and he has 7 so he is short by about 6 pills. He has a follow up appt with Dr Naaman Plummer next month.  He did not bring his bottle the month before eand was warned he must have it to get his rx.  We will check random UDS next visit since he has been short pills on previous occasions as well. His opioid risk score is 0 but he has other behaviors that warrant a random check.

## 2013-07-31 NOTE — Patient Instructions (Signed)
Follow up with Dr Naaman Plummer next month 08/28/13

## 2013-08-03 ENCOUNTER — Other Ambulatory Visit: Payer: Self-pay

## 2013-08-03 DIAGNOSIS — M1732 Unilateral post-traumatic osteoarthritis, left knee: Secondary | ICD-10-CM

## 2013-08-03 DIAGNOSIS — M722 Plantar fascial fibromatosis: Secondary | ICD-10-CM

## 2013-08-03 MED ORDER — PREGABALIN 150 MG PO CAPS: 150.0000 mg | ORAL_CAPSULE | Freq: Three times a day (TID) | ORAL | Status: DC

## 2013-08-04 DIAGNOSIS — H4050X0 Glaucoma secondary to other eye disorders, unspecified eye, stage unspecified: Secondary | ICD-10-CM | POA: Diagnosis not present

## 2013-08-05 ENCOUNTER — Encounter (HOSPITAL_BASED_OUTPATIENT_CLINIC_OR_DEPARTMENT_OTHER): Payer: Medicare Other | Attending: General Surgery

## 2013-08-05 DIAGNOSIS — I428 Other cardiomyopathies: Secondary | ICD-10-CM | POA: Insufficient documentation

## 2013-08-05 DIAGNOSIS — M86669 Other chronic osteomyelitis, unspecified tibia and fibula: Secondary | ICD-10-CM | POA: Insufficient documentation

## 2013-08-05 DIAGNOSIS — Z79899 Other long term (current) drug therapy: Secondary | ICD-10-CM | POA: Insufficient documentation

## 2013-08-05 DIAGNOSIS — L97809 Non-pressure chronic ulcer of other part of unspecified lower leg with unspecified severity: Secondary | ICD-10-CM | POA: Insufficient documentation

## 2013-08-07 NOTE — Progress Notes (Signed)
[facility]  NAME:  Victor [name], Victor [name] NO.:  [id number]  MEDICAL RECORD NO.:  [id number]      DATE OF BIRTH:  11/08/1960  PHYSICIAN:  Judene Companion, M.D.           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  This is following a surgery that he had to have free flaps done to cover a fracture of his left tibia.  He now has on the very surface of his left tibia a small area of osteomyelitis.  We are going to treat this with long-standing antibiotics and we would like to incorporate hyperbaric oxygen.  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  He has a very active lifestyle, walks incessantly and never has any shortness of breath or chest pain.  I am a little amazed that his ejection fraction on his echocardiogram is only 25-30%.  This gentleman is on Lasix and Prinivil, but has absolutely no shortness of breath, or chest pain despite his ejection fraction.  I am sure he will tolerate hyperbaric oxygen, and I wish to incorporate this with his treatment along with the dressings and antibiotics.     Judene Companion, M.D.     PP/MEDQ  D:  08/06/2013  T:  08/07/2013  Job:  [id number]

## 2013-08-12 DIAGNOSIS — I428 Other cardiomyopathies: Secondary | ICD-10-CM | POA: Diagnosis not present

## 2013-08-12 DIAGNOSIS — L97809 Non-pressure chronic ulcer of other part of unspecified lower leg with unspecified severity: Secondary | ICD-10-CM | POA: Diagnosis not present

## 2013-08-12 DIAGNOSIS — Z79899 Other long term (current) drug therapy: Secondary | ICD-10-CM | POA: Diagnosis not present

## 2013-08-12 DIAGNOSIS — M86669 Other chronic osteomyelitis, unspecified tibia and fibula: Secondary | ICD-10-CM | POA: Diagnosis not present

## 2013-08-13 DIAGNOSIS — L97809 Non-pressure chronic ulcer of other part of unspecified lower leg with unspecified severity: Secondary | ICD-10-CM | POA: Diagnosis not present

## 2013-08-13 DIAGNOSIS — I428 Other cardiomyopathies: Secondary | ICD-10-CM | POA: Diagnosis not present

## 2013-08-13 DIAGNOSIS — Z79899 Other long term (current) drug therapy: Secondary | ICD-10-CM | POA: Diagnosis not present

## 2013-08-13 DIAGNOSIS — M86669 Other chronic osteomyelitis, unspecified tibia and fibula: Secondary | ICD-10-CM | POA: Diagnosis not present

## 2013-08-14 DIAGNOSIS — I428 Other cardiomyopathies: Secondary | ICD-10-CM | POA: Diagnosis not present

## 2013-08-14 DIAGNOSIS — L97809 Non-pressure chronic ulcer of other part of unspecified lower leg with unspecified severity: Secondary | ICD-10-CM | POA: Diagnosis not present

## 2013-08-14 DIAGNOSIS — M86669 Other chronic osteomyelitis, unspecified tibia and fibula: Secondary | ICD-10-CM | POA: Diagnosis not present

## 2013-08-14 DIAGNOSIS — Z79899 Other long term (current) drug therapy: Secondary | ICD-10-CM | POA: Diagnosis not present

## 2013-08-17 DIAGNOSIS — I428 Other cardiomyopathies: Secondary | ICD-10-CM | POA: Diagnosis not present

## 2013-08-17 DIAGNOSIS — M86669 Other chronic osteomyelitis, unspecified tibia and fibula: Secondary | ICD-10-CM | POA: Diagnosis not present

## 2013-08-17 DIAGNOSIS — Z79899 Other long term (current) drug therapy: Secondary | ICD-10-CM | POA: Diagnosis not present

## 2013-08-17 DIAGNOSIS — L97809 Non-pressure chronic ulcer of other part of unspecified lower leg with unspecified severity: Secondary | ICD-10-CM | POA: Diagnosis not present

## 2013-08-18 DIAGNOSIS — M86669 Other chronic osteomyelitis, unspecified tibia and fibula: Secondary | ICD-10-CM | POA: Diagnosis not present

## 2013-08-18 DIAGNOSIS — I428 Other cardiomyopathies: Secondary | ICD-10-CM | POA: Diagnosis not present

## 2013-08-18 DIAGNOSIS — L97809 Non-pressure chronic ulcer of other part of unspecified lower leg with unspecified severity: Secondary | ICD-10-CM | POA: Diagnosis not present

## 2013-08-18 DIAGNOSIS — Z79899 Other long term (current) drug therapy: Secondary | ICD-10-CM | POA: Diagnosis not present

## 2013-08-18 LAB — GLUCOSE, CAPILLARY: GLUCOSE-CAPILLARY: 258 mg/dL — AB (ref 70–99)

## 2013-08-19 DIAGNOSIS — M86669 Other chronic osteomyelitis, unspecified tibia and fibula: Secondary | ICD-10-CM | POA: Diagnosis not present

## 2013-08-19 DIAGNOSIS — I428 Other cardiomyopathies: Secondary | ICD-10-CM | POA: Diagnosis not present

## 2013-08-19 DIAGNOSIS — Z79899 Other long term (current) drug therapy: Secondary | ICD-10-CM | POA: Diagnosis not present

## 2013-08-19 DIAGNOSIS — L97809 Non-pressure chronic ulcer of other part of unspecified lower leg with unspecified severity: Secondary | ICD-10-CM | POA: Diagnosis not present

## 2013-08-20 DIAGNOSIS — L97809 Non-pressure chronic ulcer of other part of unspecified lower leg with unspecified severity: Secondary | ICD-10-CM | POA: Diagnosis not present

## 2013-08-20 DIAGNOSIS — M86669 Other chronic osteomyelitis, unspecified tibia and fibula: Secondary | ICD-10-CM | POA: Diagnosis not present

## 2013-08-20 DIAGNOSIS — Z79899 Other long term (current) drug therapy: Secondary | ICD-10-CM | POA: Diagnosis not present

## 2013-08-20 DIAGNOSIS — I428 Other cardiomyopathies: Secondary | ICD-10-CM | POA: Diagnosis not present

## 2013-08-24 DIAGNOSIS — Z79899 Other long term (current) drug therapy: Secondary | ICD-10-CM | POA: Diagnosis not present

## 2013-08-24 DIAGNOSIS — L97809 Non-pressure chronic ulcer of other part of unspecified lower leg with unspecified severity: Secondary | ICD-10-CM | POA: Diagnosis not present

## 2013-08-24 DIAGNOSIS — I428 Other cardiomyopathies: Secondary | ICD-10-CM | POA: Diagnosis not present

## 2013-08-24 DIAGNOSIS — M86669 Other chronic osteomyelitis, unspecified tibia and fibula: Secondary | ICD-10-CM | POA: Diagnosis not present

## 2013-08-25 DIAGNOSIS — Z79899 Other long term (current) drug therapy: Secondary | ICD-10-CM | POA: Diagnosis not present

## 2013-08-25 DIAGNOSIS — M86669 Other chronic osteomyelitis, unspecified tibia and fibula: Secondary | ICD-10-CM | POA: Diagnosis not present

## 2013-08-25 DIAGNOSIS — L97809 Non-pressure chronic ulcer of other part of unspecified lower leg with unspecified severity: Secondary | ICD-10-CM | POA: Diagnosis not present

## 2013-08-25 DIAGNOSIS — I428 Other cardiomyopathies: Secondary | ICD-10-CM | POA: Diagnosis not present

## 2013-08-26 DIAGNOSIS — Z79899 Other long term (current) drug therapy: Secondary | ICD-10-CM | POA: Diagnosis not present

## 2013-08-26 DIAGNOSIS — M86669 Other chronic osteomyelitis, unspecified tibia and fibula: Secondary | ICD-10-CM | POA: Diagnosis not present

## 2013-08-26 DIAGNOSIS — I428 Other cardiomyopathies: Secondary | ICD-10-CM | POA: Diagnosis not present

## 2013-08-26 DIAGNOSIS — L97809 Non-pressure chronic ulcer of other part of unspecified lower leg with unspecified severity: Secondary | ICD-10-CM | POA: Diagnosis not present

## 2013-08-27 DIAGNOSIS — L97809 Non-pressure chronic ulcer of other part of unspecified lower leg with unspecified severity: Secondary | ICD-10-CM | POA: Diagnosis not present

## 2013-08-27 DIAGNOSIS — M86669 Other chronic osteomyelitis, unspecified tibia and fibula: Secondary | ICD-10-CM | POA: Diagnosis not present

## 2013-08-27 DIAGNOSIS — Z79899 Other long term (current) drug therapy: Secondary | ICD-10-CM | POA: Diagnosis not present

## 2013-08-27 DIAGNOSIS — I428 Other cardiomyopathies: Secondary | ICD-10-CM | POA: Diagnosis not present

## 2013-08-28 ENCOUNTER — Encounter: Payer: Self-pay | Admitting: Physical Medicine & Rehabilitation

## 2013-08-28 ENCOUNTER — Encounter: Payer: Medicare Other | Attending: Physical Medicine and Rehabilitation | Admitting: Physical Medicine & Rehabilitation

## 2013-08-28 VITALS — BP 145/84 | HR 61 | Resp 14 | Ht 73.0 in | Wt 268.0 lb

## 2013-08-28 DIAGNOSIS — X58XXXA Exposure to other specified factors, initial encounter: Secondary | ICD-10-CM | POA: Insufficient documentation

## 2013-08-28 DIAGNOSIS — M7520 Bicipital tendinitis, unspecified shoulder: Secondary | ICD-10-CM | POA: Diagnosis not present

## 2013-08-28 DIAGNOSIS — L97809 Non-pressure chronic ulcer of other part of unspecified lower leg with unspecified severity: Secondary | ICD-10-CM | POA: Diagnosis not present

## 2013-08-28 DIAGNOSIS — M175 Other unilateral secondary osteoarthritis of knee: Secondary | ICD-10-CM | POA: Diagnosis not present

## 2013-08-28 DIAGNOSIS — Z79899 Other long term (current) drug therapy: Secondary | ICD-10-CM | POA: Diagnosis not present

## 2013-08-28 DIAGNOSIS — M1732 Unilateral post-traumatic osteoarthritis, left knee: Secondary | ICD-10-CM

## 2013-08-28 DIAGNOSIS — I428 Other cardiomyopathies: Secondary | ICD-10-CM | POA: Diagnosis not present

## 2013-08-28 DIAGNOSIS — M722 Plantar fascial fibromatosis: Secondary | ICD-10-CM | POA: Insufficient documentation

## 2013-08-28 DIAGNOSIS — S8410XA Injury of peroneal nerve at lower leg level, unspecified leg, initial encounter: Secondary | ICD-10-CM | POA: Diagnosis not present

## 2013-08-28 DIAGNOSIS — M7522 Bicipital tendinitis, left shoulder: Secondary | ICD-10-CM

## 2013-08-28 DIAGNOSIS — M86669 Other chronic osteomyelitis, unspecified tibia and fibula: Secondary | ICD-10-CM | POA: Diagnosis not present

## 2013-08-28 MED ORDER — OXYCODONE HCL 15 MG PO TABS: 15.0000 mg | ORAL_TABLET | Freq: Four times a day (QID) | ORAL | Status: DC | PRN

## 2013-08-28 NOTE — Progress Notes (Signed)
Subjective:    Patient ID: Victor [name], male    DOB: November 09, 1960, 53 y.o.   MRN: [id number]  HPI   [name] is back regarding his chronic pain. His left shoulder pain hasn't improved, and in fact he feels that it's worse.  He has been receiving hyperbaric oxygen for wound care LLE. It has been helping a little.   His nerve pain has been pretty well controlled.   Pain Inventory Average Pain 8 Pain Right Now 8 My pain is dull, tingling and aching  In the last 24 hours, has pain interfered with the following? General activity 8 Relation with others 7 Enjoyment of life 7 What TIME of day is your pain at its worst? morning, night Sleep (in general) Fair  Pain is worse with: walking, inactivity and standing Pain improves with: rest, heat/ice, therapy/exercise and medication Relief from Meds: 8  Mobility walk without assistance how many minutes can you walk? 20 ability to climb steps?  yes do you drive?  yes transfers alone Do you have any goals in this area?  yes  Function disabled: date disabled na retired  Neuro/Psych numbness tingling  Prior Studies Any changes since last visit?  yes x-rays CT/MRI  Physicians involved in your care Any changes since last visit?  yes Dr. Judene Companion   Family History  Problem Relation Age of Onset  . Kidney disease Father    History   Social History  . Marital Status: Married    Spouse Name: N/A    Number of Children: N/A  . Years of Education: N/A   Social History Main Topics  . Smoking status: Never Smoker   . Smokeless tobacco: Never Used  . Alcohol Use: Yes     Comment: occasional  . Drug Use: No  . Sexual Activity: None   Other Topics Concern  . None   Social History Narrative  . None   Past Surgical History  Procedure Laterality Date  . Cataract extraction  06/2012  . Leg surgries      4 surgeries  . Retinal detachment surgery  2009   Past Medical History  Diagnosis Date  . Closed fracture of  tibia, upper end   . Lesion of lateral popliteal nerve   . Primary localized osteoarthrosis, lower leg   . Primary localized osteoarthrosis, upper arm   . Hypertension   . Breast enlargement 08/28/2012  . Cardiomyopathy- nonischemic 08/28/2012    CATH 5/14 Normal CA  EF 30%  . Chronic systolic heart failure 08/13/8117   BP 145/84  Pulse 61  Resp 14  Ht 6\' 1"  (1.854 m)  Wt 268 lb (121.564 kg)  BMI 35.37 kg/m2  SpO2 98%  Opioid Risk Score:   Fall Risk Score: Low Fall Risk (0-5 points) (pt educated and given a brochure on fall risk previously)    Review of Systems  Neurological: Positive for numbness.       Tingling  All other systems reviewed and are negative.      Objective:   Physical Exam  Constitutional: He is oriented to person, place, and time. He appears well-developed and well-nourished.  HENT:  Head: Normocephalic and atraumatic.  Eyes: Conjunctivae and EOM are normal. Pupils are equal, round, and reactive to light.  Neck: Normal range of motion.  Cardiovascular: Normal rate and regular rhythm.  Pulmonary/Chest: Effort normal and breath sounds normal.  Abdominal: Soft. Bowel sounds are normal.  Musculoskeletal: Normal range of motion.  Deformities persistent aroound the left  leg. Right knee is tender with weight bearing and ROM. He is antalgic with weight bearing. Left shoulder RTC maneuvers were equivocal. Right short head biceps tendon was tender to touch. Speeds test was positive. Cross armed maneuver and labral testing was negative. Neurological: He is alert and oriented to person, place, and time.  Chronic changes in left leg. Persistent weakness and limtied rom in the left ankle.  Skin: Skin is warm.  Psychiatric: He has a normal mood and affect. His behavior is normal. Judgment and thought content normal.  Assessment & Plan:   ASSESSMENT:  1. Left lower extremity trauma with tibial plateau fracture and distal  femur fracture with multifactorial pain and  arthritis at the joint.  2. Left Peroneal nerve injury.  3. Left biceps tendonitis (short head)      PLAN:  1. After informed consent and preparation of the skin with betadine and isopropyl alcohol, I injected 6mg  (1cc) of celestone and 4cc of 1% lidocaine around the left short head biceps tendon via anterior approach. Additionally, aspiration was performed prior to injection. The patient tolerated well, and no complications were encountered. Afterward the area was cleaned and dressed. Post- injection instructions were provided.   Consider MRI if pain is persistent after injection.   2. Continue lyrica 150mg  t.i.d. for his neuropathic pain.  pain. This has helped quite a bit. He may continue with meloxicam and use this prn.  3 Continue Oxycodone 15 mg 1 q.8 h. p.r.n., #90. .  4. left custom compression stocking/ leg sleeve. He can try coban wrap as well after wound care is complete.  6. F/u in about 4 weeks. 30 minutes of face to face patient care time were spent during this visit. All questions were encouraged and answered. Spoke to the patient about the fact that he needs to bring his narcotic pill bottles in to the office if he expects to receive refills. He expressed an understanding.

## 2013-08-28 NOTE — Patient Instructions (Signed)
ONCE PAIN IS IMPROVED. YOU MAY BEGIN RANGE OF MOTION EXERCISES WITHOUT WEIGHT.  IF YOU ARE PAIN FREE AFTER 6 WEEKS, YOU CAN BEGIN LOW WEIGHT LIFTING----   USE AGGRESSIVE ICE TO LEFT SHOULDER AS WELL.  YOU MAY USE GENTLE HEAT BEFORE EXERCISE.

## 2013-08-31 DIAGNOSIS — L97809 Non-pressure chronic ulcer of other part of unspecified lower leg with unspecified severity: Secondary | ICD-10-CM | POA: Diagnosis not present

## 2013-08-31 DIAGNOSIS — I428 Other cardiomyopathies: Secondary | ICD-10-CM | POA: Diagnosis not present

## 2013-08-31 DIAGNOSIS — Z79899 Other long term (current) drug therapy: Secondary | ICD-10-CM | POA: Diagnosis not present

## 2013-08-31 DIAGNOSIS — M86669 Other chronic osteomyelitis, unspecified tibia and fibula: Secondary | ICD-10-CM | POA: Diagnosis not present

## 2013-09-01 DIAGNOSIS — L97809 Non-pressure chronic ulcer of other part of unspecified lower leg with unspecified severity: Secondary | ICD-10-CM | POA: Diagnosis not present

## 2013-09-01 DIAGNOSIS — M86669 Other chronic osteomyelitis, unspecified tibia and fibula: Secondary | ICD-10-CM | POA: Diagnosis not present

## 2013-09-01 DIAGNOSIS — Z79899 Other long term (current) drug therapy: Secondary | ICD-10-CM | POA: Diagnosis not present

## 2013-09-01 DIAGNOSIS — I428 Other cardiomyopathies: Secondary | ICD-10-CM | POA: Diagnosis not present

## 2013-09-02 DIAGNOSIS — Z79899 Other long term (current) drug therapy: Secondary | ICD-10-CM | POA: Diagnosis not present

## 2013-09-02 DIAGNOSIS — I428 Other cardiomyopathies: Secondary | ICD-10-CM | POA: Diagnosis not present

## 2013-09-02 DIAGNOSIS — L97809 Non-pressure chronic ulcer of other part of unspecified lower leg with unspecified severity: Secondary | ICD-10-CM | POA: Diagnosis not present

## 2013-09-02 DIAGNOSIS — M86669 Other chronic osteomyelitis, unspecified tibia and fibula: Secondary | ICD-10-CM | POA: Diagnosis not present

## 2013-09-03 DIAGNOSIS — M86669 Other chronic osteomyelitis, unspecified tibia and fibula: Secondary | ICD-10-CM | POA: Diagnosis not present

## 2013-09-03 DIAGNOSIS — Z79899 Other long term (current) drug therapy: Secondary | ICD-10-CM | POA: Diagnosis not present

## 2013-09-03 DIAGNOSIS — L97809 Non-pressure chronic ulcer of other part of unspecified lower leg with unspecified severity: Secondary | ICD-10-CM | POA: Diagnosis not present

## 2013-09-03 DIAGNOSIS — I428 Other cardiomyopathies: Secondary | ICD-10-CM | POA: Diagnosis not present

## 2013-09-04 ENCOUNTER — Encounter (HOSPITAL_BASED_OUTPATIENT_CLINIC_OR_DEPARTMENT_OTHER): Payer: Medicare Other | Attending: General Surgery

## 2013-09-04 DIAGNOSIS — M86669 Other chronic osteomyelitis, unspecified tibia and fibula: Secondary | ICD-10-CM | POA: Insufficient documentation

## 2013-09-07 ENCOUNTER — Telehealth: Payer: Self-pay

## 2013-09-07 DIAGNOSIS — M86669 Other chronic osteomyelitis, unspecified tibia and fibula: Secondary | ICD-10-CM | POA: Diagnosis not present

## 2013-09-07 DIAGNOSIS — M25519 Pain in unspecified shoulder: Secondary | ICD-10-CM

## 2013-09-07 DIAGNOSIS — M7522 Bicipital tendinitis, left shoulder: Secondary | ICD-10-CM

## 2013-09-07 NOTE — Telephone Encounter (Signed)
Patient says the injection only worked for about 3 days.  He is advised an MRI has been ordered.

## 2013-09-07 NOTE — Telephone Encounter (Signed)
MRI ordered.  Did last injection help him? If so, how long

## 2013-09-07 NOTE — Telephone Encounter (Signed)
Patient called complaining of reoccurring shoulder pain.  He would like to know if he can get another shot or an MRI?  Please advise.

## 2013-09-08 DIAGNOSIS — M86669 Other chronic osteomyelitis, unspecified tibia and fibula: Secondary | ICD-10-CM | POA: Diagnosis not present

## 2013-09-09 DIAGNOSIS — M86669 Other chronic osteomyelitis, unspecified tibia and fibula: Secondary | ICD-10-CM | POA: Diagnosis not present

## 2013-09-10 ENCOUNTER — Telehealth: Payer: Self-pay

## 2013-09-10 DIAGNOSIS — M86669 Other chronic osteomyelitis, unspecified tibia and fibula: Secondary | ICD-10-CM | POA: Diagnosis not present

## 2013-09-10 NOTE — Telephone Encounter (Signed)
Patient called to get status of MRI schedule.

## 2013-09-11 DIAGNOSIS — M86669 Other chronic osteomyelitis, unspecified tibia and fibula: Secondary | ICD-10-CM | POA: Diagnosis not present

## 2013-09-13 ENCOUNTER — Ambulatory Visit
Admission: RE | Admit: 2013-09-13 | Discharge: 2013-09-13 | Disposition: A | Discharge status: Home / Self Care | Payer: Medicare Other | Source: Ambulatory Visit | Attending: Physical Medicine & Rehabilitation | Admitting: Physical Medicine & Rehabilitation

## 2013-09-13 DIAGNOSIS — M25519 Pain in unspecified shoulder: Secondary | ICD-10-CM

## 2013-09-13 DIAGNOSIS — M7522 Bicipital tendinitis, left shoulder: Secondary | ICD-10-CM

## 2013-09-14 ENCOUNTER — Other Ambulatory Visit (INDEPENDENT_AMBULATORY_CARE_PROVIDER_SITE_OTHER): Payer: Medicare Other

## 2013-09-14 DIAGNOSIS — Z79899 Other long term (current) drug therapy: Secondary | ICD-10-CM | POA: Diagnosis not present

## 2013-09-14 DIAGNOSIS — M86669 Other chronic osteomyelitis, unspecified tibia and fibula: Secondary | ICD-10-CM | POA: Diagnosis not present

## 2013-09-14 LAB — BASIC METABOLIC PANEL
BUN: 15 mg/dL (ref 6–23)
CO2: 24 mEq/L (ref 19–32)
Calcium: 9.4 mg/dL (ref 8.4–10.5)
Chloride: 109 mEq/L (ref 96–112)
Creatinine, Ser: 1 mg/dL (ref 0.4–1.5)
GFR: 100.92 mL/min (ref >60.00)
Glucose, Bld: 88 mg/dL (ref 70–99)
POTASSIUM: 4.1 meq/L (ref 3.5–5.1)
SODIUM: 140 meq/L (ref 135–145)

## 2013-09-15 DIAGNOSIS — M86669 Other chronic osteomyelitis, unspecified tibia and fibula: Secondary | ICD-10-CM | POA: Diagnosis not present

## 2013-09-16 DIAGNOSIS — M86669 Other chronic osteomyelitis, unspecified tibia and fibula: Secondary | ICD-10-CM | POA: Diagnosis not present

## 2013-09-18 ENCOUNTER — Other Ambulatory Visit: Payer: Self-pay | Admitting: Cardiology

## 2013-09-23 ENCOUNTER — Ambulatory Visit
Admission: RE | Admit: 2013-09-23 | Discharge: 2013-09-23 | Disposition: A | Discharge status: Home / Self Care | Payer: Medicare Other | Source: Ambulatory Visit | Attending: Physical Medicine & Rehabilitation | Admitting: Physical Medicine & Rehabilitation

## 2013-09-23 DIAGNOSIS — T8189XA Other complications of procedures, not elsewhere classified, initial encounter: Secondary | ICD-10-CM | POA: Diagnosis not present

## 2013-09-23 DIAGNOSIS — M19019 Primary osteoarthritis, unspecified shoulder: Secondary | ICD-10-CM | POA: Diagnosis not present

## 2013-09-23 DIAGNOSIS — I87319 Chronic venous hypertension (idiopathic) with ulcer of unspecified lower extremity: Secondary | ICD-10-CM | POA: Diagnosis not present

## 2013-09-23 DIAGNOSIS — M86669 Other chronic osteomyelitis, unspecified tibia and fibula: Secondary | ICD-10-CM | POA: Diagnosis not present

## 2013-09-23 DIAGNOSIS — IMO0002 Reserved for concepts with insufficient information to code with codable children: Secondary | ICD-10-CM | POA: Diagnosis not present

## 2013-09-23 DIAGNOSIS — M75100 Unspecified rotator cuff tear or rupture of unspecified shoulder, not specified as traumatic: Secondary | ICD-10-CM | POA: Diagnosis not present

## 2013-09-24 ENCOUNTER — Encounter: Payer: Medicare Other | Attending: Physical Medicine and Rehabilitation | Admitting: Registered Nurse

## 2013-09-24 ENCOUNTER — Encounter: Payer: Self-pay | Admitting: Registered Nurse

## 2013-09-24 VITALS — BP 147/89 | HR 62 | Resp 14 | Ht 73.0 in | Wt 269.0 lb

## 2013-09-24 DIAGNOSIS — S8410XA Injury of peroneal nerve at lower leg level, unspecified leg, initial encounter: Secondary | ICD-10-CM | POA: Diagnosis not present

## 2013-09-24 DIAGNOSIS — M722 Plantar fascial fibromatosis: Secondary | ICD-10-CM

## 2013-09-24 DIAGNOSIS — X58XXXA Exposure to other specified factors, initial encounter: Secondary | ICD-10-CM | POA: Insufficient documentation

## 2013-09-24 DIAGNOSIS — M175 Other unilateral secondary osteoarthritis of knee: Secondary | ICD-10-CM | POA: Insufficient documentation

## 2013-09-24 DIAGNOSIS — M86669 Other chronic osteomyelitis, unspecified tibia and fibula: Secondary | ICD-10-CM | POA: Diagnosis not present

## 2013-09-24 DIAGNOSIS — M7522 Bicipital tendinitis, left shoulder: Secondary | ICD-10-CM

## 2013-09-24 DIAGNOSIS — Z5181 Encounter for therapeutic drug level monitoring: Secondary | ICD-10-CM

## 2013-09-24 DIAGNOSIS — Z79899 Other long term (current) drug therapy: Secondary | ICD-10-CM

## 2013-09-24 DIAGNOSIS — M7520 Bicipital tendinitis, unspecified shoulder: Secondary | ICD-10-CM | POA: Diagnosis not present

## 2013-09-24 DIAGNOSIS — M1732 Unilateral post-traumatic osteoarthritis, left knee: Secondary | ICD-10-CM

## 2013-09-24 MED ORDER — OXYCODONE HCL 15 MG PO TABS: 15.0000 mg | ORAL_TABLET | Freq: Four times a day (QID) | ORAL | Status: DC | PRN

## 2013-09-24 NOTE — Progress Notes (Signed)
Subjective:    Patient ID: Victor [name], male    DOB: December 05, 1960, 53 y.o.   MRN: [id number]  HPI: [name] is a 53 year old male who returns for follow up for chronic pain and medication refill. He says his pain is located in his left shoulder, left knee and ankle. He rates his pain 8.  He had a steroid injection last visit he only had relief for three days.His current exercise regime is walking on treadmill, bicycling five days a week. He continues with hyperbaric oxygen wound treatments 5 days a week for 2 hours.  He was told to continue to hold off on weight lifting at this time. MRI Results as follows: EXAM:  MRI OF THE LEFT SHOULDER WITHOUT CONTRAST  TECHNIQUE:  Multiplanar, multisequence MR imaging of the shoulder was performed.  No intravenous contrast was administered.  COMPARISON: None.  FINDINGS:  Rotator cuff: There is focal degeneration of the anterior aspect of  the distal supraspinatus tendon with tendinopathy and a tiny  intrasubstance tear seen on image 9 of series 6. The rest of the  rotator cuff is intact.  Muscles: Normal. No atrophy or edema.  Biceps long head: Properly located and intact. However, the tendon  is somewhat diminutive.  Acromioclavicular Joint: Minimal degenerative changes. Normal type 2  acromion. Slight subdeltoid bursitis anteriorly, best seen on image  9 of series 3.  Glenohumeral Joint: Normal.  Labrum: Normal.  Bones: Slight degenerative changes of the anterior aspect of the  greater tuberosity.  IMPRESSION:  1. 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  2. Overlying slight subdeltoid bursitis.   Pain Inventory Average Pain 8 Pain Right Now 8 My pain is constant, sharp, dull and tingling  In the last 24 hours, has pain interfered with the following? General activity 8 Relation with others 8 Enjoyment of life 8 What TIME of day is your pain at its worst? morning, night Sleep (in general)  Fair  Pain is worse with: walking Pain improves with: rest and medication Relief from Meds: 8  Mobility walk without assistance how many minutes can you walk? 20 transfers alone Do you have any goals in this area?  yes  Function disabled: date disabled na retired Do you have any goals in this area?  no  Neuro/Psych numbness  Prior Studies Any changes since last visit?  yes CT/MRI  Physicians involved in your care Any changes since last visit?  no   Family History  Problem Relation Age of Onset  . Kidney disease Father    History   Social History  . Marital Status: Married    Spouse Name: N/A    Number of Children: N/A  . Years of Education: N/A   Social History Main Topics  . Smoking status: Never Smoker   . Smokeless tobacco: Never Used  . Alcohol Use: Yes     Comment: occasional  . Drug Use: No  . Sexual Activity: None   Other Topics Concern  . None   Social History Narrative  . None   Past Surgical History  Procedure Laterality Date  . Cataract extraction  06/2012  . Leg surgries      4 surgeries  . Retinal detachment surgery  2009   Past Medical History  Diagnosis Date  . Closed fracture of tibia, upper end   . Lesion of lateral popliteal nerve   . Primary localized osteoarthrosis, lower leg   . Primary localized osteoarthrosis, upper arm   .  Hypertension   . Breast enlargement 08/28/2012  . Cardiomyopathy- nonischemic 08/28/2012    CATH 5/14 Normal CA  EF 30%  . Chronic systolic heart failure 5/63/1497   BP 147/89  Pulse 62  Resp 14  Ht 6\' 1"  (1.854 m)  Wt 269 lb (122.018 kg)  BMI 35.50 kg/m2  SpO2 97%  Opioid Risk Score:   Fall Risk Score: Low Fall Risk (0-5 points) (pt educated on fall risk, brochure given to pt previously)   Review of Systems  Neurological: Positive for numbness.  All other systems reviewed and are negative.      Objective:   Physical Exam  Nursing note and vitals reviewed. Constitutional: He is  oriented to person, place, and time. He appears well-developed and well-nourished.  HENT:  Head: Normocephalic and atraumatic.  Neck: Normal range of motion. Neck supple.  Cardiovascular: Normal rate, regular rhythm and normal heart sounds.   Pulmonary/Chest: Effort normal and breath sounds normal.  Neurological: He is alert and oriented to person, place, and time.  Skin: Skin is warm and dry.  Psychiatric: He has a normal mood and affect.          Assessment & Plan:  1 Left lower extremity trauma with tibial plateau fracture and distal femur fracture with multifactorial pain and arthritis at the joint. Continue with Meloxicam, Lyrica and Refilled: Oxycodone 15 mg one tablet every  6 hours as needed #90 2. Left Peroneal nerve injury. Continue Current medication regime. 3. Left biceps tendonitis (short head) . MRI discussed, will ask Dr. Naaman Plummer his opinion and will call patient in the morning. Patient verbalized understanding. Continue Current Medication Regime.  20 minutes of face to face patient care time was spent during this visit. All questions were encouraged and answered.  F/U in 1 month.

## 2013-09-25 ENCOUNTER — Telehealth: Payer: Self-pay | Admitting: Physical Medicine & Rehabilitation

## 2013-09-25 DIAGNOSIS — M86669 Other chronic osteomyelitis, unspecified tibia and fibula: Secondary | ICD-10-CM | POA: Diagnosis not present

## 2013-09-25 NOTE — Telephone Encounter (Signed)
Spoke with Victor [name] who will discuss plan with patient

## 2013-09-25 NOTE — Telephone Encounter (Signed)
I Spoke with Victor [name], He is aware we will not increase his medications and Dr. Naaman Plummer can offer him other treatment options. He verbalized understanding.

## 2013-09-29 DIAGNOSIS — M86669 Other chronic osteomyelitis, unspecified tibia and fibula: Secondary | ICD-10-CM | POA: Diagnosis not present

## 2013-09-30 DIAGNOSIS — M86669 Other chronic osteomyelitis, unspecified tibia and fibula: Secondary | ICD-10-CM | POA: Diagnosis not present

## 2013-10-01 DIAGNOSIS — M86669 Other chronic osteomyelitis, unspecified tibia and fibula: Secondary | ICD-10-CM | POA: Diagnosis not present

## 2013-10-02 DIAGNOSIS — M86669 Other chronic osteomyelitis, unspecified tibia and fibula: Secondary | ICD-10-CM | POA: Diagnosis not present

## 2013-10-05 ENCOUNTER — Encounter (HOSPITAL_BASED_OUTPATIENT_CLINIC_OR_DEPARTMENT_OTHER): Payer: Medicare Other | Attending: General Surgery

## 2013-10-05 DIAGNOSIS — M86669 Other chronic osteomyelitis, unspecified tibia and fibula: Secondary | ICD-10-CM | POA: Insufficient documentation

## 2013-10-06 DIAGNOSIS — Z961 Presence of intraocular lens: Secondary | ICD-10-CM | POA: Diagnosis not present

## 2013-10-06 DIAGNOSIS — H4050X0 Glaucoma secondary to other eye disorders, unspecified eye, stage unspecified: Secondary | ICD-10-CM | POA: Diagnosis not present

## 2013-10-06 DIAGNOSIS — H2010 Chronic iridocyclitis, unspecified eye: Secondary | ICD-10-CM | POA: Diagnosis not present

## 2013-10-06 DIAGNOSIS — H31009 Unspecified chorioretinal scars, unspecified eye: Secondary | ICD-10-CM | POA: Diagnosis not present

## 2013-10-06 DIAGNOSIS — M86669 Other chronic osteomyelitis, unspecified tibia and fibula: Secondary | ICD-10-CM | POA: Diagnosis not present

## 2013-10-07 ENCOUNTER — Other Ambulatory Visit: Payer: Self-pay | Admitting: Interventional Cardiology

## 2013-10-07 DIAGNOSIS — M86669 Other chronic osteomyelitis, unspecified tibia and fibula: Secondary | ICD-10-CM | POA: Diagnosis not present

## 2013-10-08 DIAGNOSIS — M86669 Other chronic osteomyelitis, unspecified tibia and fibula: Secondary | ICD-10-CM | POA: Diagnosis not present

## 2013-10-09 DIAGNOSIS — M86669 Other chronic osteomyelitis, unspecified tibia and fibula: Secondary | ICD-10-CM | POA: Diagnosis not present

## 2013-10-12 DIAGNOSIS — M86669 Other chronic osteomyelitis, unspecified tibia and fibula: Secondary | ICD-10-CM | POA: Diagnosis not present

## 2013-10-13 DIAGNOSIS — M86669 Other chronic osteomyelitis, unspecified tibia and fibula: Secondary | ICD-10-CM | POA: Diagnosis not present

## 2013-10-14 DIAGNOSIS — Z125 Encounter for screening for malignant neoplasm of prostate: Secondary | ICD-10-CM | POA: Diagnosis not present

## 2013-10-14 DIAGNOSIS — M86669 Other chronic osteomyelitis, unspecified tibia and fibula: Secondary | ICD-10-CM | POA: Diagnosis not present

## 2013-10-14 DIAGNOSIS — J45909 Unspecified asthma, uncomplicated: Secondary | ICD-10-CM | POA: Diagnosis not present

## 2013-10-20 ENCOUNTER — Other Ambulatory Visit: Payer: Self-pay | Admitting: Physical Medicine & Rehabilitation

## 2013-10-23 ENCOUNTER — Encounter: Payer: Self-pay | Admitting: Registered Nurse

## 2013-10-23 ENCOUNTER — Encounter: Payer: Medicare Other | Attending: Physical Medicine and Rehabilitation | Admitting: Registered Nurse

## 2013-10-23 VITALS — BP 127/77 | HR 69 | Resp 14 | Ht 73.0 in | Wt 269.0 lb

## 2013-10-23 DIAGNOSIS — M722 Plantar fascial fibromatosis: Secondary | ICD-10-CM | POA: Diagnosis not present

## 2013-10-23 DIAGNOSIS — M1732 Unilateral post-traumatic osteoarthritis, left knee: Secondary | ICD-10-CM

## 2013-10-23 DIAGNOSIS — Z79899 Other long term (current) drug therapy: Secondary | ICD-10-CM

## 2013-10-23 DIAGNOSIS — M7520 Bicipital tendinitis, unspecified shoulder: Secondary | ICD-10-CM | POA: Diagnosis not present

## 2013-10-23 DIAGNOSIS — Z5181 Encounter for therapeutic drug level monitoring: Secondary | ICD-10-CM

## 2013-10-23 DIAGNOSIS — M175 Other unilateral secondary osteoarthritis of knee: Secondary | ICD-10-CM | POA: Diagnosis not present

## 2013-10-23 DIAGNOSIS — X58XXXA Exposure to other specified factors, initial encounter: Secondary | ICD-10-CM | POA: Insufficient documentation

## 2013-10-23 DIAGNOSIS — S8410XA Injury of peroneal nerve at lower leg level, unspecified leg, initial encounter: Secondary | ICD-10-CM | POA: Diagnosis not present

## 2013-10-23 DIAGNOSIS — M7522 Bicipital tendinitis, left shoulder: Secondary | ICD-10-CM

## 2013-10-23 DIAGNOSIS — S8411XA Injury of peroneal nerve at lower leg level, right leg, initial encounter: Secondary | ICD-10-CM

## 2013-10-23 MED ORDER — OXYCODONE HCL 15 MG PO TABS: 15.0000 mg | ORAL_TABLET | Freq: Four times a day (QID) | ORAL | Status: DC | PRN

## 2013-10-23 NOTE — Progress Notes (Signed)
Subjective:    Patient ID: Victor [name], male    DOB: 1961-04-01, 53 y.o.   MRN: [id number]  HPI: [name] is a 53 year old male who returns for follow up for chronic pain and medication refill. He says his pain is located in his left shoulder and left leg. He rates his pain 8.  He states he finish hyperbaric treatments.  His current exercise regime is bicycling at the [employer] three times a week. He's not doing any weight lifting. He has an appointment with a Orthopedist at the end of the month.  Pain Inventory Average Pain 8 Pain Right Now 8 My pain is sharp, dull and tingling  In the last 24 hours, has pain interfered with the following? General activity 8 Relation with others 8 Enjoyment of life 8 What TIME of day is your pain at its worst? morning, night Sleep (in general) Fair  Pain is worse with: walking, bending, inactivity, standing and some activites Pain improves with: rest, heat/ice, medication and injections Relief from Meds: 8  Mobility walk without assistance how many minutes can you walk? 20 ability to climb steps?  yes do you drive?  yes transfers alone Do you have any goals in this area?  yes  Function disabled: date disabled na retired Do you have any goals in this area?  no  Neuro/Psych numbness  Prior Studies Any changes since last visit?  no  Physicians involved in your care Any changes since last visit?  no   Family History  Problem Relation Age of Onset  . Kidney disease Father    History   Social History  . Marital Status: Married    Spouse Name: N/A    Number of Children: N/A  . Years of Education: N/A   Social History Main Topics  . Smoking status: Never Smoker   . Smokeless tobacco: Never Used  . Alcohol Use: Yes     Comment: occasional  . Drug Use: No  . Sexual Activity: None   Other Topics Concern  . None   Social History Narrative  . None   Past Surgical History  Procedure Laterality Date  .  Cataract extraction  06/2012  . Leg surgries      4 surgeries  . Retinal detachment surgery  2009   Past Medical History  Diagnosis Date  . Closed fracture of tibia, upper end   . Lesion of lateral popliteal nerve   . Primary localized osteoarthrosis, lower leg   . Primary localized osteoarthrosis, upper arm   . Hypertension   . Breast enlargement 08/28/2012  . Cardiomyopathy- nonischemic 08/28/2012    CATH 5/14 Normal CA  EF 30%  . Chronic systolic heart failure 7/51/0258   BP 127/77  Pulse 69  Resp 14  Ht 6\' 1"  (1.854 m)  Wt 269 lb (122.018 kg)  BMI 35.50 kg/m2  SpO2 97%  Opioid Risk Score:   Fall Risk Score: Low Fall Risk (0-5 points) (pt educated on fall risk, brochure given to pt previously)    Review of Systems  Neurological: Positive for numbness.  All other systems reviewed and are negative.      Objective:   Physical Exam  Nursing note and vitals reviewed. Constitutional: He is oriented to person, place, and time. He appears well-developed and well-nourished.  HENT:  Head: Normocephalic and atraumatic.  Neck: Normal range of motion. Neck supple.  Cardiovascular: Normal rate and regular rhythm.   Pulmonary/Chest: Effort normal and breath  sounds normal.  Musculoskeletal:  Normal Muscle Bulk and Muscle testing Reveals: Upper Extremities with Full ROM and Muscle Strength 5/5. Left AC Joint Tenderness with Palpation. Lower Extremities: Full ROM and Muscle Strength 5/5. Arises from chair with ease. Narrow based gait  Neurological: He is alert and oriented to person, place, and time.  Skin: Skin is warm and dry.  Psychiatric: He has a normal mood and affect.          Assessment & Plan:  1 Left lower extremity trauma with tibial plateau fracture and distal femur fracture with multifactorial pain and arthritis at the joint. Continue with Meloxicam, Lyrica and Refilled: Oxycodone 15 mg one tablet every 6 hours as needed #90  2. Left Peroneal nerve injury.  Continue Current medication regime.  3. Left biceps tendonitis (short head) . Continue Current Medication Regime.   20 minutes of face to face patient care time was spent during this visit. All questions were encouraged and answered.   F/U in 1 month.

## 2013-10-31 DIAGNOSIS — M79609 Pain in unspecified limb: Secondary | ICD-10-CM | POA: Diagnosis not present

## 2013-11-04 ENCOUNTER — Encounter (HOSPITAL_BASED_OUTPATIENT_CLINIC_OR_DEPARTMENT_OTHER): Payer: Medicare Other | Attending: General Surgery

## 2013-11-04 DIAGNOSIS — M86669 Other chronic osteomyelitis, unspecified tibia and fibula: Secondary | ICD-10-CM | POA: Diagnosis not present

## 2013-11-10 NOTE — Telephone Encounter (Signed)
This encounter was created in error - please disregard.

## 2013-11-17 ENCOUNTER — Encounter: Payer: Medicare Other | Attending: Physical Medicine and Rehabilitation | Admitting: Physical Medicine & Rehabilitation

## 2013-11-17 ENCOUNTER — Encounter: Payer: Self-pay | Admitting: Physical Medicine & Rehabilitation

## 2013-11-17 VITALS — BP 118/71 | HR 83 | Resp 14 | Wt 266.0 lb

## 2013-11-17 DIAGNOSIS — M722 Plantar fascial fibromatosis: Secondary | ICD-10-CM | POA: Diagnosis not present

## 2013-11-17 DIAGNOSIS — X58XXXA Exposure to other specified factors, initial encounter: Secondary | ICD-10-CM | POA: Insufficient documentation

## 2013-11-17 DIAGNOSIS — M67919 Unspecified disorder of synovium and tendon, unspecified shoulder: Secondary | ICD-10-CM | POA: Diagnosis not present

## 2013-11-17 DIAGNOSIS — M7582 Other shoulder lesions, left shoulder: Secondary | ICD-10-CM | POA: Insufficient documentation

## 2013-11-17 DIAGNOSIS — M7522 Bicipital tendinitis, left shoulder: Secondary | ICD-10-CM

## 2013-11-17 DIAGNOSIS — S8411XA Injury of peroneal nerve at lower leg level, right leg, initial encounter: Secondary | ICD-10-CM

## 2013-11-17 DIAGNOSIS — M175 Other unilateral secondary osteoarthritis of knee: Secondary | ICD-10-CM | POA: Diagnosis not present

## 2013-11-17 DIAGNOSIS — M7520 Bicipital tendinitis, unspecified shoulder: Secondary | ICD-10-CM | POA: Diagnosis not present

## 2013-11-17 DIAGNOSIS — M1732 Unilateral post-traumatic osteoarthritis, left knee: Secondary | ICD-10-CM

## 2013-11-17 DIAGNOSIS — M719 Bursopathy, unspecified: Secondary | ICD-10-CM

## 2013-11-17 DIAGNOSIS — S8410XA Injury of peroneal nerve at lower leg level, unspecified leg, initial encounter: Secondary | ICD-10-CM | POA: Diagnosis not present

## 2013-11-17 MED ORDER — OXYCODONE HCL 15 MG PO TABS: 15.0000 mg | ORAL_TABLET | Freq: Four times a day (QID) | ORAL | Status: DC | PRN

## 2013-11-17 NOTE — Progress Notes (Signed)
Procedure Note: Left shoulder injection Indication: 1. 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        2. Overlying slight subdeltoid bursitis.    After informed consent and preparation of the skin with betadine and isopropyl alcohol, I injected 6mg  (1cc) of celestone and 4cc of 1% lidocaine into the left subacromial space via lateral approach. Additionally, aspiration was performed prior to injection. The patient tolerated well, and no complications were encountered. Afterward the area was cleaned and dressed. Post- injection instructions were provided.

## 2013-11-17 NOTE — Patient Instructions (Signed)
PLEASE CALL ME WITH ANY PROBLEMS OR QUESTIONS (#[phone]).      

## 2013-12-14 DIAGNOSIS — M7500 Adhesive capsulitis of unspecified shoulder: Secondary | ICD-10-CM | POA: Diagnosis not present

## 2013-12-14 DIAGNOSIS — M79609 Pain in unspecified limb: Secondary | ICD-10-CM | POA: Diagnosis not present

## 2013-12-15 ENCOUNTER — Other Ambulatory Visit: Payer: Self-pay

## 2013-12-15 ENCOUNTER — Encounter: Payer: Medicare Other | Attending: Physical Medicine and Rehabilitation | Admitting: Registered Nurse

## 2013-12-15 ENCOUNTER — Encounter: Payer: Self-pay | Admitting: Registered Nurse

## 2013-12-15 VITALS — BP 135/71 | HR 89 | Resp 14 | Ht 73.0 in | Wt 269.0 lb

## 2013-12-15 DIAGNOSIS — S8410XA Injury of peroneal nerve at lower leg level, unspecified leg, initial encounter: Secondary | ICD-10-CM

## 2013-12-15 DIAGNOSIS — M722 Plantar fascial fibromatosis: Secondary | ICD-10-CM

## 2013-12-15 DIAGNOSIS — M7520 Bicipital tendinitis, unspecified shoulder: Secondary | ICD-10-CM | POA: Insufficient documentation

## 2013-12-15 DIAGNOSIS — M25519 Pain in unspecified shoulder: Secondary | ICD-10-CM

## 2013-12-15 DIAGNOSIS — Z79899 Other long term (current) drug therapy: Secondary | ICD-10-CM

## 2013-12-15 DIAGNOSIS — Z5181 Encounter for therapeutic drug level monitoring: Secondary | ICD-10-CM

## 2013-12-15 DIAGNOSIS — M7522 Bicipital tendinitis, left shoulder: Secondary | ICD-10-CM

## 2013-12-15 DIAGNOSIS — M1732 Unilateral post-traumatic osteoarthritis, left knee: Secondary | ICD-10-CM

## 2013-12-15 DIAGNOSIS — X58XXXA Exposure to other specified factors, initial encounter: Secondary | ICD-10-CM | POA: Diagnosis not present

## 2013-12-15 DIAGNOSIS — S8412XA Injury of peroneal nerve at lower leg level, left leg, initial encounter: Secondary | ICD-10-CM

## 2013-12-15 DIAGNOSIS — M25512 Pain in left shoulder: Secondary | ICD-10-CM

## 2013-12-15 DIAGNOSIS — S8411XA Injury of peroneal nerve at lower leg level, right leg, initial encounter: Secondary | ICD-10-CM

## 2013-12-15 DIAGNOSIS — M175 Other unilateral secondary osteoarthritis of knee: Secondary | ICD-10-CM | POA: Insufficient documentation

## 2013-12-15 MED ORDER — OXYCODONE HCL 15 MG PO TABS: 15.0000 mg | ORAL_TABLET | Freq: Four times a day (QID) | ORAL | Status: DC | PRN

## 2013-12-15 MED ORDER — DICLOFENAC SODIUM 1 % TD GEL: 2.0000 g | Freq: Four times a day (QID) | TRANSDERMAL | Status: DC

## 2013-12-15 NOTE — Progress Notes (Signed)
Subjective:    Patient ID: Victor [name], male    DOB: 01-16-1961, 53 y.o.   MRN: [id number]  HPI: [name] is a 53 year old male who returns for follow up for chronic pain and medication refill. He says his pain is located in his left shoulder and left leg. He rates his pain 7.  His current exercise regime is going to [employer] 5 days a week. He's walking, using stationary bicycle and treadmill.  He seen his orthopedist on 12/14/13 and received a cortisone injection in his left shoulder. Also he will be starting physical therapy, his initial evaluation is 12/22/13.  Pain Inventory Average Pain 7 Pain Right Now 7 My pain is sharp, dull, stabbing, tingling and aching  In the last 24 hours, has pain interfered with the following? General activity 8 Relation with others 8 Enjoyment of life 8 What TIME of day is your pain at its worst? orning, night Sleep (in general) Fair  Pain is worse with: walking, bending and standing Pain improves with: rest, heat/ice, therapy/exercise, pacing activities, medication and injections Relief from Meds: 8  Mobility walk without assistance how many minutes can you walk? 15 Do you have any goals in this area?  yes  Function disabled: date disabled na retired Do you have any goals in this area?  yes  Neuro/Psych No problems in this area  Prior Studies Any changes since last visit?  yes x-rays  Physicians involved in your care Any changes since last visit?  yes Primary care [facility]   Family History  Problem Relation Age of Onset  . Kidney disease Father    History   Social History  . Marital Status: Married    Spouse Name: N/A    Number of Children: N/A  . Years of Education: N/A   Social History Main Topics  . Smoking status: Never Smoker   . Smokeless tobacco: Never Used  . Alcohol Use: Yes     Comment: occasional  . Drug Use: No  . Sexual Activity: None   Other Topics Concern  . None    Social History Narrative  . None   Past Surgical History  Procedure Laterality Date  . Cataract extraction  06/2012  . Leg surgries      4 surgeries  . Retinal detachment surgery  2009   Past Medical History  Diagnosis Date  . Closed fracture of tibia, upper end   . Lesion of lateral popliteal nerve   . Primary localized osteoarthrosis, lower leg   . Primary localized osteoarthrosis, upper arm   . Hypertension   . Breast enlargement 08/28/2012  . Cardiomyopathy- nonischemic 08/28/2012    CATH 5/14 Normal CA  EF 30%  . Chronic systolic heart failure 7/82/9562   BP 135/71  Pulse 89  Resp 14  Ht 6\' 1"  (1.854 m)  Wt 269 lb (122.018 kg)  BMI 35.50 kg/m2  SpO2 95%  Opioid Risk Score:   Fall Risk Score: Moderate Fall Risk (6-13 points) (pt educated, brochure declined)    Review of Systems  All other systems reviewed and are negative.      Objective:   Physical Exam  Nursing note and vitals reviewed. Constitutional: He is oriented to person, place, and time. He appears well-developed and well-nourished.  HENT:  Head: Normocephalic and atraumatic.  Neck: Normal range of motion. Neck supple.  Cardiovascular: Normal rate, regular rhythm and normal heart sounds.   Pulmonary/Chest: Effort normal and breath sounds  normal.  Musculoskeletal:  Normal Muscle Bulk and Muscle Testing Reveals: Upper Extremities:Right arm with Full ROM and Muscle Strength on the Right 5/5.  Left Decreased ROM 90 Degrees and Muscle Strength  4/5. Left AC Joint Tenderness. Lower Extremities: Full ROM and Muscle Strength 5/5 Arises from chair with ease Narrow based Gait  Neurological: He is alert and oriented to person, place, and time.  Skin: Skin is warm and dry.  Psychiatric: He has a normal mood and affect.          Assessment & Plan:  1 Left lower extremity trauma with tibial plateau fracture and distal femur fracture with multifactorial pain and arthritis at the joint. Continue with  Meloxicam, Lyrica and Refilled: Oxycodone 15 mg one tablet every 6 hours as needed #90  2. Left Peroneal nerve injury. Continue Current medication regime.  3. Left biceps tendonitis (short head) . Continue Current Medication Regime. S/P Cortisone Injection from Orthopedist on 12/15/13. Will be starting Physical Therapy. Evaluation 12/22/13.  20 minutes of face to face patient care time was spent during this visit. All questions were encouraged and answered.   F/U in 1 month.

## 2013-12-16 ENCOUNTER — Encounter (HOSPITAL_BASED_OUTPATIENT_CLINIC_OR_DEPARTMENT_OTHER): Payer: Medicare Other | Attending: General Surgery

## 2013-12-16 DIAGNOSIS — M869 Osteomyelitis, unspecified: Secondary | ICD-10-CM | POA: Insufficient documentation

## 2013-12-17 ENCOUNTER — Ambulatory Visit: Payer: Medicare Other | Admitting: Registered Nurse

## 2013-12-22 DIAGNOSIS — M7500 Adhesive capsulitis of unspecified shoulder: Secondary | ICD-10-CM | POA: Diagnosis not present

## 2013-12-24 DIAGNOSIS — M7500 Adhesive capsulitis of unspecified shoulder: Secondary | ICD-10-CM | POA: Diagnosis not present

## 2013-12-25 ENCOUNTER — Ambulatory Visit: Payer: Medicare Other | Admitting: Physical Medicine & Rehabilitation

## 2013-12-31 DIAGNOSIS — M7500 Adhesive capsulitis of unspecified shoulder: Secondary | ICD-10-CM | POA: Diagnosis not present

## 2014-01-04 DIAGNOSIS — M7500 Adhesive capsulitis of unspecified shoulder: Secondary | ICD-10-CM | POA: Diagnosis not present

## 2014-01-05 DIAGNOSIS — M7500 Adhesive capsulitis of unspecified shoulder: Secondary | ICD-10-CM | POA: Diagnosis not present

## 2014-01-07 DIAGNOSIS — M7500 Adhesive capsulitis of unspecified shoulder: Secondary | ICD-10-CM | POA: Diagnosis not present

## 2014-01-12 DIAGNOSIS — M7500 Adhesive capsulitis of unspecified shoulder: Secondary | ICD-10-CM | POA: Diagnosis not present

## 2014-01-13 ENCOUNTER — Encounter: Payer: Self-pay | Admitting: Registered Nurse

## 2014-01-13 ENCOUNTER — Encounter: Payer: Medicare Other | Attending: Physical Medicine and Rehabilitation | Admitting: Registered Nurse

## 2014-01-13 VITALS — BP 127/72 | HR 71 | Resp 14 | Wt 270.0 lb

## 2014-01-13 DIAGNOSIS — S8411XA Injury of peroneal nerve at lower leg level, right leg, initial encounter: Secondary | ICD-10-CM

## 2014-01-13 DIAGNOSIS — M1732 Unilateral post-traumatic osteoarthritis, left knee: Secondary | ICD-10-CM

## 2014-01-13 DIAGNOSIS — M7520 Bicipital tendinitis, unspecified shoulder: Secondary | ICD-10-CM | POA: Diagnosis present

## 2014-01-13 DIAGNOSIS — M25519 Pain in unspecified shoulder: Secondary | ICD-10-CM

## 2014-01-13 DIAGNOSIS — Z5181 Encounter for therapeutic drug level monitoring: Secondary | ICD-10-CM | POA: Diagnosis not present

## 2014-01-13 DIAGNOSIS — S8410XA Injury of peroneal nerve at lower leg level, unspecified leg, initial encounter: Secondary | ICD-10-CM | POA: Insufficient documentation

## 2014-01-13 DIAGNOSIS — M175 Other unilateral secondary osteoarthritis of knee: Secondary | ICD-10-CM | POA: Diagnosis not present

## 2014-01-13 DIAGNOSIS — M722 Plantar fascial fibromatosis: Secondary | ICD-10-CM | POA: Diagnosis not present

## 2014-01-13 DIAGNOSIS — S8412XA Injury of peroneal nerve at lower leg level, left leg, initial encounter: Secondary | ICD-10-CM

## 2014-01-13 DIAGNOSIS — M7522 Bicipital tendinitis, left shoulder: Secondary | ICD-10-CM

## 2014-01-13 DIAGNOSIS — Z79899 Other long term (current) drug therapy: Secondary | ICD-10-CM

## 2014-01-13 DIAGNOSIS — M25512 Pain in left shoulder: Secondary | ICD-10-CM

## 2014-01-13 MED ORDER — OXYCODONE HCL 15 MG PO TABS: 15.0000 mg | ORAL_TABLET | Freq: Four times a day (QID) | ORAL | Status: DC | PRN

## 2014-01-13 NOTE — Progress Notes (Signed)
Subjective:    Patient ID: Victor [name], male    DOB: November 27, 1960, 53 y.o.   MRN: [id number]  HPI: Victor [name] is a 53 year old male who returns for follow up for chronic pain and medication refill. He says his pain is located in his left shoulder, left knee and left ankle. He rates his pain 8. His current exercise regime is going to [employer] 5 days a week. He's working on cardio, core, pushups, using stationary bicycle and treadmill. Started physical therapy twice a week also.  Pain Inventory Average Pain 8 Pain Right Now 8 My pain is sharp, dull, tingling and aching  In the last 24 hours, has pain interfered with the following? General activity 8 Relation with others 8 Enjoyment of life 8 What TIME of day is your pain at its worst? morning and night Sleep (in general) Fair  Pain is worse with: walking, standing and some activites Pain improves with: rest, heat/ice, therapy/exercise, medication and injections Relief from Meds: 8  Mobility walk without assistance how many minutes can you walk? 15 ability to climb steps?  yes do you drive?  yes  Function disabled: date disabled . retired  Neuro/Psych numbness tingling  Prior Studies Any changes since last visit?  no  Physicians involved in your care Any changes since last visit?  no   Family History  Problem Relation Age of Onset  . Kidney disease Father    History   Social History  . Marital Status: Married    Spouse Name: N/A    Number of Children: N/A  . Years of Education: N/A   Social History Main Topics  . Smoking status: Never Smoker   . Smokeless tobacco: Never Used  . Alcohol Use: Yes     Comment: occasional  . Drug Use: No  . Sexual Activity: None   Other Topics Concern  . None   Social History Narrative  . None   Past Surgical History  Procedure Laterality Date  . Cataract extraction  06/2012  . Leg surgries      4 surgeries  . Retinal detachment surgery  2009   Past  Medical History  Diagnosis Date  . Closed fracture of tibia, upper end   . Lesion of lateral popliteal nerve   . Primary localized osteoarthrosis, lower leg   . Primary localized osteoarthrosis, upper arm   . Hypertension   . Breast enlargement 08/28/2012  . Cardiomyopathy- nonischemic 08/28/2012    CATH 5/14 Normal CA  EF 30%  . Chronic systolic heart failure 0/35/4656   BP 127/72  Pulse 71  Resp 14  Wt 270 lb (122.471 kg)  SpO2 97%  Opioid Risk Score:   Fall Risk Score: Low Fall Risk (0-5 points) (pewvioulsy educated and handout)  Review of Systems  Neurological: Positive for numbness.       Tingling  All other systems reviewed and are negative.      Objective:   Physical Exam  Nursing note and vitals reviewed. Constitutional: He is oriented to person, place, and time. He appears well-developed and well-nourished.  HENT:  Head: Normocephalic and atraumatic.  Neck: Normal range of motion. Neck supple.  Cardiovascular: Normal rate and regular rhythm.   Pulmonary/Chest: Effort normal and breath sounds normal.  Musculoskeletal:  Normal Muscle Bulk and Muscle testing Reveals: Upper Extremities: Full ROM on the Right and Muscle strength 5/5 Left with decreased ROM 90 Degrees and Muscle Strength 4/5 Lower Extremities: Full ROM and Muscle  Strength 5/5 Arises from chair with ease Narrow Based Gait  Neurological: He is alert and oriented to person, place, and time.  Skin: Skin is warm and dry.  Psychiatric: He has a normal mood and affect.          Assessment & Plan:  1 Left lower extremity trauma with tibial plateau fracture and distal femur fracture with multifactorial pain and arthritis at the joint. Continue with Meloxicam, Lyrica and Refilled: Oxycodone 15 mg one tablet every 6 hours as needed #90  2. Left Peroneal nerve injury. Continue Current medication regime.  3. Left biceps tendonitis (short head) . Continue with  Physical Therapy twice a week. Orthopedist  Following.  20 minutes of face to face patient care time was spent during this visit. All questions were encouraged and answered.   F/U in 1 month.

## 2014-01-14 DIAGNOSIS — M7500 Adhesive capsulitis of unspecified shoulder: Secondary | ICD-10-CM | POA: Diagnosis not present

## 2014-01-19 DIAGNOSIS — M7500 Adhesive capsulitis of unspecified shoulder: Secondary | ICD-10-CM | POA: Diagnosis not present

## 2014-01-21 DIAGNOSIS — M7500 Adhesive capsulitis of unspecified shoulder: Secondary | ICD-10-CM | POA: Diagnosis not present

## 2014-01-25 DIAGNOSIS — H4050X0 Glaucoma secondary to other eye disorders, unspecified eye, stage unspecified: Secondary | ICD-10-CM | POA: Diagnosis not present

## 2014-01-25 DIAGNOSIS — H2010 Chronic iridocyclitis, unspecified eye: Secondary | ICD-10-CM | POA: Diagnosis not present

## 2014-01-25 DIAGNOSIS — M7500 Adhesive capsulitis of unspecified shoulder: Secondary | ICD-10-CM | POA: Diagnosis not present

## 2014-02-08 DIAGNOSIS — M25512 Pain in left shoulder: Secondary | ICD-10-CM | POA: Diagnosis not present

## 2014-02-10 ENCOUNTER — Encounter: Payer: Medicare Other | Attending: Physical Medicine and Rehabilitation | Admitting: Registered Nurse

## 2014-02-10 ENCOUNTER — Encounter: Payer: Self-pay | Admitting: Registered Nurse

## 2014-02-10 VITALS — BP 142/82 | HR 96 | Resp 16

## 2014-02-10 DIAGNOSIS — Z5181 Encounter for therapeutic drug level monitoring: Secondary | ICD-10-CM | POA: Diagnosis not present

## 2014-02-10 DIAGNOSIS — M722 Plantar fascial fibromatosis: Secondary | ICD-10-CM | POA: Insufficient documentation

## 2014-02-10 DIAGNOSIS — S8411XA Injury of peroneal nerve at lower leg level, right leg, initial encounter: Secondary | ICD-10-CM | POA: Insufficient documentation

## 2014-02-10 DIAGNOSIS — M25512 Pain in left shoulder: Secondary | ICD-10-CM

## 2014-02-10 DIAGNOSIS — F411 Generalized anxiety disorder: Secondary | ICD-10-CM

## 2014-02-10 DIAGNOSIS — S8412XA Injury of peroneal nerve at lower leg level, left leg, initial encounter: Secondary | ICD-10-CM | POA: Diagnosis not present

## 2014-02-10 DIAGNOSIS — M7522 Bicipital tendinitis, left shoulder: Secondary | ICD-10-CM

## 2014-02-10 DIAGNOSIS — M1732 Unilateral post-traumatic osteoarthritis, left knee: Secondary | ICD-10-CM

## 2014-02-10 DIAGNOSIS — Z79899 Other long term (current) drug therapy: Secondary | ICD-10-CM

## 2014-02-10 MED ORDER — OXYCODONE HCL 15 MG PO TABS: 15.0000 mg | ORAL_TABLET | Freq: Four times a day (QID) | ORAL | Status: DC | PRN

## 2014-02-10 MED ORDER — ALPRAZOLAM 0.5 MG PO TABS: 0.5000 mg | ORAL_TABLET | Freq: Every evening | ORAL | Status: DC | PRN

## 2014-02-10 NOTE — Progress Notes (Signed)
Subjective:    Patient ID: Victor [name], male    DOB: 1960/10/26, 53 y.o.   MRN: [id number]  HPI: [name] is a 53 year old male who returns for follow up for chronic pain and medication refill. He says his pain is located in his left shoulder and left leg. He rates his pain 8. His current exercise regime is has been sporadic due to increase intensity of shoulder pain. He was going to physical therapy twice a week, this has been placed on hold due to pain. Orthopedist ordered a MRI, he has a follow up appointment with Orthopedist on 02/11/2014.   Pain Inventory Average Pain 9 Pain Right Now 9 My pain is constant, sharp, dull, tingling and aching  In the last 24 hours, has pain interfered with the following? General activity 9 Relation with others 9 Enjoyment of life 9 What TIME of day is your pain at its worst? morning and night Sleep (in general) Poor  Pain is worse with: walking, bending and standing Pain improves with: rest, heat/ice, therapy/exercise, medication and injections Relief from Meds: 8  Mobility walk without assistance how many minutes can you walk? 15  Function disabled: date disabled . retired  Neuro/Psych numbness  Prior Studies Any changes since last visit?  no  Physicians involved in your care Any changes since last visit?  no   Family History  Problem Relation Age of Onset  . Kidney disease Father    History   Social History  . Marital Status: Married    Spouse Name: N/A    Number of Children: N/A  . Years of Education: N/A   Social History Main Topics  . Smoking status: Never Smoker   . Smokeless tobacco: Never Used  . Alcohol Use: Yes     Comment: occasional  . Drug Use: No  . Sexual Activity: None   Other Topics Concern  . None   Social History Narrative  . None   Past Surgical History  Procedure Laterality Date  . Cataract extraction  06/2012  . Leg surgries      4 surgeries  . Retinal detachment surgery   2009   Past Medical History  Diagnosis Date  . Closed fracture of tibia, upper end   . Lesion of lateral popliteal nerve   . Primary localized osteoarthrosis, lower leg   . Primary localized osteoarthrosis, upper arm   . Hypertension   . Breast enlargement 08/28/2012  . Cardiomyopathy- nonischemic 08/28/2012    CATH 5/14 Normal CA  EF 30%  . Chronic systolic heart failure 4/65/0354   BP 142/82  Pulse 96  Resp 16  SpO2 98%  Opioid Risk Score:   Fall Risk Score: Low Fall Risk (0-5 points) (previoulsy educated and given handout)   Review of Systems  All other systems reviewed and are negative.      Objective:   Physical Exam  Nursing note and vitals reviewed. Constitutional: He is oriented to person, place, and time. He appears well-developed and well-nourished.  HENT:  Head: Normocephalic and atraumatic.  Neck: Normal range of motion. Neck supple.  Cardiovascular: Normal rate and regular rhythm.   Pulmonary/Chest: Effort normal and breath sounds normal.  Musculoskeletal:  Normal Muscle Bulk and Muscle Testing Reveals: Upper Extremities: Full ROM and Muscle Strength 5/5 Left AC Joint Tenderness/ Teres Minor/Major Tenderness Lower Extremities: Right Full ROM and Muscle Strength 5/5 Left Leg with Decreased ROM and Muscle Strength 4/5 Arises from chair with ease Narrow Based  Gait   Neurological: He is alert and oriented to person, place, and time.  Skin: Skin is warm and dry.  Psychiatric: He has a normal mood and affect.          Assessment & Plan:  1 Left lower extremity trauma with tibial plateau fracture and distal femur fracture with multifactorial pain and arthritis at the joint. Continue with Meloxicam, Lyrica and Refilled: Oxycodone 15 mg one tablet every 6 hours as needed #90  2. Left Peroneal nerve injury. Continue Current medication regime.  3. Left biceps tendonitis (short head) .Orthopedist Following.  4. Anxiety : RX: Xanax 0.5 mg HS Prn #15  20  minutes of face to face patient care time was spent during this visit. All questions were encouraged and answered.   F/U in 1 month.

## 2014-02-11 DIAGNOSIS — M7502 Adhesive capsulitis of left shoulder: Secondary | ICD-10-CM | POA: Diagnosis not present

## 2014-02-12 ENCOUNTER — Telehealth: Payer: Self-pay | Admitting: Interventional Cardiology

## 2014-02-12 ENCOUNTER — Ambulatory Visit: Payer: Medicare Other | Admitting: Physical Medicine & Rehabilitation

## 2014-02-12 NOTE — Telephone Encounter (Signed)
New message     Pt needs shoulder surgery.  He needs a clearance from Dr Ledell [facility] faxed something to [place] but they did not receive it

## 2014-02-12 NOTE — Telephone Encounter (Signed)
No further testing needed.  He has a low EF.  Nonischemic cardiomyopathy.  Juducious use of IV fluids to avoid CHF. Watch for any signs of fluid overload.

## 2014-02-12 NOTE — Telephone Encounter (Signed)
To Dr. Varanasi, please advise.  

## 2014-02-15 NOTE — Telephone Encounter (Signed)
Pt.notified

## 2014-02-15 NOTE — Telephone Encounter (Signed)
Faxed written Clearance note, ekg, last OV note and cardiopulmonary stress test to [facility].

## 2014-02-16 ENCOUNTER — Telehealth: Payer: Self-pay | Admitting: Interventional Cardiology

## 2014-02-16 NOTE — Telephone Encounter (Signed)
Received request from Nurse fax box, documents faxed for surgical clearance. To: [employer] Fax number: [phone] Attention: 10.13.15/km

## 2014-02-17 ENCOUNTER — Other Ambulatory Visit: Payer: Self-pay | Admitting: Surgical

## 2014-02-18 NOTE — Patient Instructions (Addendum)
Victor [name]  02/18/2014   Your procedure is scheduled on:  02/25/2014    Report to [place] at 100pm.  Come thru the [facility] and follow the signs to the Short Stay.    Call this number if you have problems the morning of surgery: [phone]   Remember:   Do not eat food after midnite.  May have clear liquids until 0830am  Then nothing by mouth.    Take these medicines the morning of surgery with A SIP OF WATER: Xanax if needed, coreg, Advair, xopenex and oxycodone if needed.  Bring Inhalers with you to hospital.    Do not wear jewelry  Do not wear lotions, powders, or perfumes.  deodorant.  . Men may shave face and neck.  Do not bring valuables to the hospital.  Contacts, dentures or bridgework may not be worn into surgery.  Leave suitcase in the car. After surgery it may be brought to your room.  For patients admitted to the hospital, checkout time is 11:00 AM the day of  discharge.     Please read over the following fact sheets that you were given:    [place] Allowed                                                                     Foods Excluded  Coffee and tea, regular and decaf                             liquids that you cannot  Plain Jell-O in any flavor                                             see through such as: Fruit ices (not with fruit pulp)                                     milk, soups, orange juice  Iced Popsicles                                    All solid food Carbonated beverages, regular and diet                                    Cranberry, grape and apple juices Sports drinks like Gatorade Lightly seasoned clear broth or consume(fat free) Sugar, honey syrup  Sample Menu Breakfast                                Lunch                                     Supper Cranberry juice  Beef broth                            Chicken broth Jell-O                                     Grape juice                            Apple juice Coffee or tea                        Jell-O                                      Popsicle                                                Coffee or tea                        Coffee or tea  _____________________________________________________________________  [facility] - Preparing for Surgery Before surgery, you can play an important role.  Because skin is not sterile, your skin needs to be as free of germs as possible.  You can reduce the number of germs on your skin by washing with CHG (chlorahexidine gluconate) soap before surgery.  CHG is an antiseptic cleaner which kills germs and bonds with the skin to continue killing germs even after washing. Please DO NOT use if you have an allergy to CHG or antibacterial soaps.  If your skin becomes reddened/irritated stop using the CHG and inform your nurse when you arrive at Short Stay. Do not shave (including legs and underarms) for at least 48 hours prior to the first CHG shower.  You may shave your face/neck. Please follow these instructions carefully:  1.  Shower with CHG Soap the night before surgery and the  morning of Surgery.  2.  If you choose to wash your hair, wash your hair first as usual with your  normal  shampoo.  3.  After you shampoo, rinse your hair and body thoroughly to remove the  shampoo.                           4.  Use CHG as you would any other liquid soap.  You can apply chg directly  to the skin and wash                       Gently with a scrungie or clean washcloth.  5.  Apply the CHG Soap to your body ONLY FROM THE NECK DOWN.   Do not use on face/ open                           Wound or open sores. Avoid contact with eyes, ears mouth and genitals (private parts).                       Wash face,  Genitals (private parts) with your normal soap.             6.  Wash thoroughly, paying special attention to the area where your surgery  will be performed.  7.  Thoroughly rinse your body with warm  water from the neck down.  8.  DO NOT shower/wash with your normal soap after using and rinsing off  the CHG Soap.                9.  Pat yourself dry with a clean towel.            10.  Wear clean pajamas.            11.  Place clean sheets on your bed the night of your first shower and do not  sleep with pets. Day of Surgery : Do not apply any lotions/deodorants the morning of surgery.  Please wear clean clothes to the hospital/surgery center.  FAILURE TO FOLLOW THESE INSTRUCTIONS MAY RESULT IN THE CANCELLATION OF YOUR SURGERY PATIENT SIGNATURE_________________________________  NURSE SIGNATURE__________________________________  ________________________________________________________________________   Victor [name]  An incentive spirometer is a tool that can help keep your lungs clear and active. This tool measures how well you are filling your lungs with each breath. Taking long deep breaths may help reverse or decrease the chance of developing breathing (pulmonary) problems (especially infection) following:  A long period of time when you are unable to move or be active. BEFORE THE PROCEDURE   If the spirometer includes an indicator to show your best effort, your nurse or respiratory therapist will set it to a desired goal.  If possible, sit up straight or lean slightly forward. Try not to slouch.  Hold the incentive spirometer in an upright position. INSTRUCTIONS FOR USE  1. Sit on the edge of your bed if possible, or sit up as far as you can in bed or on a chair. 2. Hold the incentive spirometer in an upright position. 3. Breathe out normally. 4. Place the mouthpiece in your mouth and seal your lips tightly around it. 5. Breathe in slowly and as deeply as possible, raising the piston or the ball toward the top of the column. 6. Hold your breath for 3-5 seconds or for as long as possible. Allow the piston or ball to fall to the bottom of the column. 7. Remove the  mouthpiece from your mouth and breathe out normally. 8. Rest for a few seconds and repeat Steps 1 through 7 at least 10 times every 1-2 hours when you are awake. Take your time and take a few normal breaths between deep breaths. 9. The spirometer may include an indicator to show your best effort. Use the indicator as a goal to work toward during each repetition. 10. After each set of 10 deep breaths, practice coughing to be sure your lungs are clear. If you have an incision (the cut made at the time of surgery), support your incision when coughing by placing a pillow or rolled up towels firmly against it. Once you are able to get out of bed, walk around indoors and cough well. You may stop using the incentive spirometer when instructed by your caregiver.  RISKS AND COMPLICATIONS  Take your time so you do not get dizzy or light-headed.  If you are in pain, you may need to take or ask for pain medication before doing incentive spirometry. It is harder to take a deep breath if you are having pain. AFTER USE  Rest and breathe slowly and easily.  It can be helpful to keep track of a log of your progress. Your caregiver can provide you with a simple table to help with this. If you are using the spirometer at home, follow these instructions: [place] IF:   You are having difficultly using the spirometer.  You have trouble using the spirometer as often as instructed.  Your pain medication is not giving enough relief while using the spirometer.  You develop fever of 100.5 F (38.1 C) or higher. SEEK IMMEDIATE MEDICAL CARE IF:   You cough up bloody sputum that had not been present before.  You develop fever of 102 F (38.9 C) or greater.  You develop worsening pain at or near the incision site. MAKE SURE YOU:   Understand these instructions.  Will watch your condition.  Will get help right away if you are not doing well or get worse. Document Released: 09/03/2006 Document  Revised: 07/16/2011 Document Reviewed: 11/04/2006 ExitCare Patient Information 2014 [place].   ________________________________________________________________________  coughing and deep breathing exercises, leg exercises

## 2014-02-19 ENCOUNTER — Encounter (HOSPITAL_COMMUNITY): Payer: Self-pay | Admitting: Pharmacy Technician

## 2014-02-22 ENCOUNTER — Encounter (HOSPITAL_COMMUNITY)
Admission: RE | Admit: 2014-02-22 | Discharge: 2014-02-22 | Disposition: A | Discharge status: Home / Self Care | Payer: Medicare Other | Source: Ambulatory Visit | Attending: Orthopedic Surgery | Admitting: Orthopedic Surgery

## 2014-02-22 ENCOUNTER — Encounter (HOSPITAL_COMMUNITY): Payer: Self-pay

## 2014-02-22 ENCOUNTER — Ambulatory Visit (HOSPITAL_COMMUNITY)
Admission: RE | Admit: 2014-02-22 | Discharge: 2014-02-22 | Disposition: A | Discharge status: Home / Self Care | Payer: Medicare Other | Source: Ambulatory Visit | Attending: Surgical | Admitting: Surgical

## 2014-02-22 DIAGNOSIS — I444 Left anterior fascicular block: Secondary | ICD-10-CM | POA: Diagnosis not present

## 2014-02-22 DIAGNOSIS — J45909 Unspecified asthma, uncomplicated: Secondary | ICD-10-CM | POA: Diagnosis present

## 2014-02-22 DIAGNOSIS — I517 Cardiomegaly: Secondary | ICD-10-CM | POA: Insufficient documentation

## 2014-02-22 DIAGNOSIS — Z01818 Encounter for other preprocedural examination: Secondary | ICD-10-CM | POA: Diagnosis not present

## 2014-02-22 DIAGNOSIS — I10 Essential (primary) hypertension: Secondary | ICD-10-CM | POA: Insufficient documentation

## 2014-02-22 DIAGNOSIS — Z8679 Personal history of other diseases of the circulatory system: Secondary | ICD-10-CM | POA: Diagnosis not present

## 2014-02-22 HISTORY — DX: Unspecified asthma, uncomplicated: J45.909

## 2014-02-22 LAB — CBC
HEMATOCRIT: 46.2 % (ref 39.0–52.0)
HEMOGLOBIN: 16 g/dL (ref 13.0–17.0)
MCH: 31 pg (ref 26.0–34.0)
MCHC: 34.6 g/dL (ref 30.0–36.0)
MCV: 89.5 fL (ref 78.0–100.0)
Platelets: 232 10*3/uL (ref 150–400)
RBC: 5.16 MIL/uL (ref 4.22–5.81)
RDW: 12.5 % (ref 11.5–15.5)
WBC: 6.4 10*3/uL (ref 4.0–10.5)

## 2014-02-22 LAB — URINALYSIS, ROUTINE W REFLEX MICROSCOPIC
Bilirubin Urine: NEGATIVE
Glucose, UA: NEGATIVE mg/dL
Hgb urine dipstick: NEGATIVE
Ketones, ur: NEGATIVE mg/dL
Leukocytes, UA: NEGATIVE
Nitrite: NEGATIVE
Protein, ur: NEGATIVE mg/dL
Specific Gravity, Urine: 1.014 (ref 1.005–1.030)
Urobilinogen, UA: 0.2 mg/dL (ref 0.0–1.0)
pH: 5 (ref 5.0–8.0)

## 2014-02-22 LAB — COMPREHENSIVE METABOLIC PANEL
ALT: 23 U/L (ref 0–53)
AST: 30 U/L (ref 0–37)
Albumin: 4.7 g/dL (ref 3.5–5.2)
Alkaline Phosphatase: 50 U/L (ref 39–117)
Anion gap: 14 (ref 5–15)
BUN: 13 mg/dL (ref 6–23)
CO2: 23 mEq/L (ref 19–32)
Calcium: 9.7 mg/dL (ref 8.4–10.5)
Chloride: 102 mEq/L (ref 96–112)
Creatinine, Ser: 1 mg/dL (ref 0.50–1.35)
GFR calc Af Amer: >90 mL/min (ref >90)
GFR calc non Af Amer: 85 mL/min — ABNORMAL LOW (ref >90)
Glucose, Bld: 99 mg/dL (ref 70–99)
Potassium: 4.3 mEq/L (ref 3.7–5.3)
Sodium: 139 mEq/L (ref 137–147)
Total Bilirubin: 1.1 mg/dL (ref 0.3–1.2)
Total Protein: 8.3 g/dL (ref 6.0–8.3)

## 2014-02-22 LAB — PROTIME-INR
INR: 1.11 (ref 0.00–1.49)
Prothrombin Time: 14.4 seconds (ref 11.6–15.2)

## 2014-02-22 LAB — APTT: aPTT: 29 seconds (ref 24–37)

## 2014-02-22 NOTE — Progress Notes (Addendum)
CXR 19/19/15 EPIC  CXR 07/22/13 EPIC  ECHO 07/28/13 EPIC - EF 20-25 %  Stress Test 11/26/12 EPIC   [place] LOV note- 06/27/13 EPIC  EKG done 02/22/14 in [place]

## 2014-02-24 MED ORDER — DEXTROSE 5 % IV SOLN
3.0000 g | INTRAVENOUS | Status: AC
  Administered 2014-02-25: 3 g via INTRAVENOUS
  Filled 2014-02-24: qty 3000

## 2014-02-24 NOTE — Anesthesia Preprocedure Evaluation (Addendum)
Anesthesia Evaluation  Patient identified by MRN, date of birth, ID band Patient awake    Reviewed: Allergy & Precautions, H&P , NPO status , Patient's Chart, lab work & pertinent test results  History of Anesthesia Complications Negative for: history of anesthetic complications  Airway Mallampati: II TM Distance: >3 FB Neck ROM: Full    Dental no notable dental hx.    Pulmonary asthma ,  breath sounds clear to auscultation  Pulmonary exam normal       Cardiovascular hypertension, Pt. on medications and Pt. on home beta blockers +CHF Rhythm:Regular Rate:Normal  Patient works out and lifts weights 5x a week and never reports any symptoms of chest pain, dyspnea, PND, peripheral edema   Neuro/Psych negative neurological ROS  negative psych ROS   GI/Hepatic negative GI ROS, Neg liver ROS,   Endo/Other  negative endocrine ROS  Renal/GU negative Renal ROS  negative genitourinary   Musculoskeletal negative musculoskeletal ROS (+)   Abdominal   Peds negative pediatric ROS (+)  Hematology negative hematology ROS (+)   Anesthesia Other Findings   Reproductive/Obstetrics negative OB ROS                          Anesthesia Physical Anesthesia Plan  ASA: III  Anesthesia Plan: General   Post-op Pain Management:    Induction: Intravenous  Airway Management Planned: Oral ETT  Additional Equipment:   Intra-op Plan:   Post-operative Plan: Extubation in OR  Informed Consent: I have reviewed the patients History and Physical, chart, labs and discussed the procedure including the risks, benefits and alternatives for the proposed anesthesia with the patient or authorized representative who has indicated his/her understanding and acceptance.   Dental advisory given  Plan Discussed with: CRNA  Anesthesia Plan Comments:         Anesthesia Quick Evaluation

## 2014-02-25 ENCOUNTER — Encounter (HOSPITAL_COMMUNITY): Payer: Self-pay

## 2014-02-25 ENCOUNTER — Encounter (HOSPITAL_COMMUNITY)
Admission: RE | Disposition: A | Discharge status: Home / Self Care | Payer: Self-pay | Source: Ambulatory Visit | Attending: Orthopedic Surgery

## 2014-02-25 ENCOUNTER — Encounter (HOSPITAL_COMMUNITY): Payer: Medicare Other | Admitting: Anesthesiology

## 2014-02-25 ENCOUNTER — Observation Stay (HOSPITAL_COMMUNITY)
Admission: RE | Admit: 2014-02-25 | Discharge: 2014-02-26 | Disposition: A | Discharge status: Home / Self Care | Payer: Medicare Other | Source: Ambulatory Visit | Attending: Orthopedic Surgery | Admitting: Orthopedic Surgery

## 2014-02-25 ENCOUNTER — Ambulatory Visit (HOSPITAL_COMMUNITY): Payer: Medicare Other | Admitting: Anesthesiology

## 2014-02-25 DIAGNOSIS — I10 Essential (primary) hypertension: Secondary | ICD-10-CM | POA: Diagnosis not present

## 2014-02-25 DIAGNOSIS — I5022 Chronic systolic (congestive) heart failure: Secondary | ICD-10-CM | POA: Diagnosis not present

## 2014-02-25 DIAGNOSIS — I429 Cardiomyopathy, unspecified: Secondary | ICD-10-CM | POA: Insufficient documentation

## 2014-02-25 DIAGNOSIS — M75122 Complete rotator cuff tear or rupture of left shoulder, not specified as traumatic: Secondary | ICD-10-CM | POA: Diagnosis not present

## 2014-02-25 DIAGNOSIS — M7542 Impingement syndrome of left shoulder: Secondary | ICD-10-CM | POA: Diagnosis not present

## 2014-02-25 DIAGNOSIS — J45909 Unspecified asthma, uncomplicated: Secondary | ICD-10-CM | POA: Insufficient documentation

## 2014-02-25 DIAGNOSIS — E669 Obesity, unspecified: Secondary | ICD-10-CM | POA: Diagnosis not present

## 2014-02-25 DIAGNOSIS — M158 Other polyosteoarthritis: Secondary | ICD-10-CM | POA: Insufficient documentation

## 2014-02-25 DIAGNOSIS — M75102 Unspecified rotator cuff tear or rupture of left shoulder, not specified as traumatic: Secondary | ICD-10-CM | POA: Diagnosis not present

## 2014-02-25 DIAGNOSIS — Z6835 Body mass index (BMI) 35.0-35.9, adult: Secondary | ICD-10-CM | POA: Insufficient documentation

## 2014-02-25 DIAGNOSIS — M75120 Complete rotator cuff tear or rupture of unspecified shoulder, not specified as traumatic: Secondary | ICD-10-CM | POA: Diagnosis present

## 2014-02-25 HISTORY — PX: SHOULDER OPEN ROTATOR CUFF REPAIR: SHX2407

## 2014-02-25 SURGERY — REPAIR, ROTATOR CUFF, OPEN
Anesthesia: General | Site: Shoulder | Laterality: Left

## 2014-02-25 MED ORDER — METHOCARBAMOL 500 MG PO TABS: 500.0000 mg | ORAL_TABLET | Freq: Four times a day (QID) | ORAL | Status: DC | PRN

## 2014-02-25 MED ORDER — FENTANYL CITRATE 0.05 MG/ML IJ SOLN
INTRAMUSCULAR | Status: DC | PRN
  Administered 2014-02-25 (×4): 50 ug via INTRAVENOUS

## 2014-02-25 MED ORDER — HYDROMORPHONE HCL 1 MG/ML IJ SOLN
0.5000 mg | INTRAMUSCULAR | Status: DC | PRN
  Administered 2014-02-25 – 2014-02-26 (×6): 1 mg via INTRAVENOUS
  Filled 2014-02-25 (×7): qty 1

## 2014-02-25 MED ORDER — PHENOL 1.4 % MT LIQD
1.0000 | OROMUCOSAL | Status: DC | PRN
Start: 2014-02-25 — End: 2014-02-26
  Filled 2014-02-25: qty 177

## 2014-02-25 MED ORDER — LACTATED RINGERS IV SOLN
INTRAVENOUS | Status: DC
  Administered 2014-02-26: via INTRAVENOUS

## 2014-02-25 MED ORDER — ONDANSETRON HCL 4 MG/2ML IJ SOLN
4.0000 mg | Freq: Four times a day (QID) | INTRAMUSCULAR | Status: DC | PRN
  Filled 2014-02-25: qty 2

## 2014-02-25 MED ORDER — THROMBIN 5000 UNITS EX SOLR
CUTANEOUS | Status: AC
  Filled 2014-02-25: qty 5000

## 2014-02-25 MED ORDER — METHOCARBAMOL 500 MG PO TABS
500.0000 mg | ORAL_TABLET | Freq: Four times a day (QID) | ORAL | Status: DC | PRN
  Administered 2014-02-26 (×2): 500 mg via ORAL
  Filled 2014-02-25 (×2): qty 1

## 2014-02-25 MED ORDER — CEFAZOLIN SODIUM 1-5 GM-% IV SOLN
1.0000 g | Freq: Once | INTRAVENOUS | Status: DC
  Filled 2014-02-25: qty 50

## 2014-02-25 MED ORDER — ALBUTEROL SULFATE (2.5 MG/3ML) 0.083% IN NEBU
2.5000 mg | INHALATION_SOLUTION | Freq: Four times a day (QID) | RESPIRATORY_TRACT | Status: DC | PRN
  Administered 2014-02-25: 2.5 mg via RESPIRATORY_TRACT

## 2014-02-25 MED ORDER — ALPRAZOLAM 0.25 MG PO TABS
0.2500 mg | ORAL_TABLET | Freq: Every day | ORAL | Status: DC | PRN
  Administered 2014-02-25: 0.25 mg via ORAL
  Filled 2014-02-25: qty 1

## 2014-02-25 MED ORDER — PREGABALIN 75 MG PO CAPS
150.0000 mg | ORAL_CAPSULE | Freq: Three times a day (TID) | ORAL | Status: DC
  Administered 2014-02-25 – 2014-02-26 (×2): 150 mg via ORAL
  Filled 2014-02-25 (×2): qty 2

## 2014-02-25 MED ORDER — SODIUM CHLORIDE 0.9 % IJ SOLN
INTRAMUSCULAR | Status: AC
  Filled 2014-02-25: qty 3

## 2014-02-25 MED ORDER — ACETAMINOPHEN 650 MG RE SUPP: 650.0000 mg | Freq: Four times a day (QID) | RECTAL | Status: DC | PRN

## 2014-02-25 MED ORDER — ACETAMINOPHEN 325 MG PO TABS: 650.0000 mg | ORAL_TABLET | Freq: Four times a day (QID) | ORAL | Status: DC | PRN

## 2014-02-25 MED ORDER — MENTHOL 3 MG MT LOZG
1.0000 | LOZENGE | OROMUCOSAL | Status: DC | PRN
  Filled 2014-02-25: qty 9

## 2014-02-25 MED ORDER — LEVALBUTEROL TARTRATE 45 MCG/ACT IN AERO: 2.0000 | INHALATION_SPRAY | Freq: Three times a day (TID) | RESPIRATORY_TRACT | Status: DC | PRN

## 2014-02-25 MED ORDER — SUCCINYLCHOLINE CHLORIDE 20 MG/ML IJ SOLN
INTRAMUSCULAR | Status: DC | PRN
  Administered 2014-02-25: 180 mg via INTRAVENOUS

## 2014-02-25 MED ORDER — FLEET ENEMA 7-19 GM/118ML RE ENEM: 1.0000 | ENEMA | Freq: Once | RECTAL | Status: AC | PRN

## 2014-02-25 MED ORDER — MOMETASONE FURO-FORMOTEROL FUM 100-5 MCG/ACT IN AERO
2.0000 | INHALATION_SPRAY | Freq: Two times a day (BID) | RESPIRATORY_TRACT | Status: DC
  Administered 2014-02-25 – 2014-02-26 (×2): 2 via RESPIRATORY_TRACT
  Filled 2014-02-25: qty 8.8

## 2014-02-25 MED ORDER — BUPIVACAINE LIPOSOME 1.3 % IJ SUSP
INTRAMUSCULAR | Status: DC | PRN
  Administered 2014-02-25: 20 mL

## 2014-02-25 MED ORDER — LACTATED RINGERS IV SOLN
INTRAVENOUS | Status: DC
  Administered 2014-02-25: 15:00:00 via INTRAVENOUS
  Administered 2014-02-25: 1000 mL via INTRAVENOUS

## 2014-02-25 MED ORDER — LISINOPRIL 20 MG PO TABS
20.0000 mg | ORAL_TABLET | Freq: Every morning | ORAL | Status: DC
  Administered 2014-02-26: 20 mg via ORAL
  Filled 2014-02-25: qty 1

## 2014-02-25 MED ORDER — ALBUTEROL SULFATE (2.5 MG/3ML) 0.083% IN NEBU: 2.5000 mg | INHALATION_SOLUTION | Freq: Four times a day (QID) | RESPIRATORY_TRACT | Status: DC | PRN

## 2014-02-25 MED ORDER — FENTANYL CITRATE 0.05 MG/ML IJ SOLN
INTRAMUSCULAR | Status: AC
  Filled 2014-02-25: qty 2

## 2014-02-25 MED ORDER — 0.9 % SODIUM CHLORIDE (POUR BTL) OPTIME
TOPICAL | Status: DC | PRN
  Administered 2014-02-25: 1000 mL

## 2014-02-25 MED ORDER — SODIUM CHLORIDE 0.9 % IR SOLN
Status: DC | PRN
  Administered 2014-02-25: 16:00:00

## 2014-02-25 MED ORDER — HYDROMORPHONE HCL 1 MG/ML IJ SOLN
INTRAMUSCULAR | Status: AC
  Administered 2014-02-26: 1 mg via INTRAVENOUS
  Filled 2014-02-25: qty 1

## 2014-02-25 MED ORDER — POLYETHYLENE GLYCOL 3350 17 G PO PACK: 17.0000 g | PACK | Freq: Every day | ORAL | Status: DC | PRN

## 2014-02-25 MED ORDER — SIMVASTATIN 20 MG PO TABS
20.0000 mg | ORAL_TABLET | Freq: Every evening | ORAL | Status: DC
  Administered 2014-02-25: 20 mg via ORAL
  Filled 2014-02-25 (×2): qty 1

## 2014-02-25 MED ORDER — ONDANSETRON HCL 4 MG PO TABS: 4.0000 mg | ORAL_TABLET | Freq: Four times a day (QID) | ORAL | Status: DC | PRN

## 2014-02-25 MED ORDER — ONDANSETRON HCL 4 MG/2ML IJ SOLN
INTRAMUSCULAR | Status: AC
  Filled 2014-02-25: qty 2

## 2014-02-25 MED ORDER — SPIRONOLACTONE 25 MG PO TABS
25.0000 mg | ORAL_TABLET | Freq: Every morning | ORAL | Status: DC
  Administered 2014-02-26: 25 mg via ORAL
  Filled 2014-02-25: qty 1

## 2014-02-25 MED ORDER — BACITRACIN-NEOMYCIN-POLYMYXIN 400-5-5000 EX OINT
TOPICAL_OINTMENT | CUTANEOUS | Status: AC
  Filled 2014-02-25: qty 1

## 2014-02-25 MED ORDER — BUPIVACAINE LIPOSOME 1.3 % IJ SUSP
20.0000 mL | Freq: Once | INTRAMUSCULAR | Status: DC
  Filled 2014-02-25: qty 20

## 2014-02-25 MED ORDER — BISACODYL 5 MG PO TBEC: 5.0000 mg | DELAYED_RELEASE_TABLET | Freq: Every day | ORAL | Status: DC | PRN

## 2014-02-25 MED ORDER — OXYCODONE HCL 5 MG PO TABS: 5.0000 mg | ORAL_TABLET | ORAL | Status: DC | PRN

## 2014-02-25 MED ORDER — FUROSEMIDE 40 MG PO TABS
40.0000 mg | ORAL_TABLET | Freq: Every morning | ORAL | Status: DC
  Administered 2014-02-26: 40 mg via ORAL
  Filled 2014-02-25: qty 1

## 2014-02-25 MED ORDER — ALBUTEROL SULFATE (2.5 MG/3ML) 0.083% IN NEBU
INHALATION_SOLUTION | RESPIRATORY_TRACT | Status: AC
  Filled 2014-02-25: qty 3

## 2014-02-25 MED ORDER — CEFAZOLIN SODIUM 1-5 GM-% IV SOLN
1.0000 g | Freq: Four times a day (QID) | INTRAVENOUS | Status: AC
  Administered 2014-02-25 – 2014-02-26 (×3): 1 g via INTRAVENOUS
  Filled 2014-02-25 (×3): qty 50

## 2014-02-25 MED ORDER — HYDROCODONE-ACETAMINOPHEN 5-325 MG PO TABS
1.0000 | ORAL_TABLET | ORAL | Status: DC | PRN
  Administered 2014-02-25 – 2014-02-26 (×4): 2 via ORAL
  Filled 2014-02-25 (×4): qty 2

## 2014-02-25 MED ORDER — SODIUM CHLORIDE 0.9 % IR SOLN
Status: AC
  Filled 2014-02-25: qty 1

## 2014-02-25 MED ORDER — MONTELUKAST SODIUM 10 MG PO TABS
10.0000 mg | ORAL_TABLET | Freq: Every day | ORAL | Status: DC
  Administered 2014-02-25: 10 mg via ORAL
  Filled 2014-02-25 (×2): qty 1

## 2014-02-25 MED ORDER — FENTANYL CITRATE 0.05 MG/ML IJ SOLN: 25.0000 ug | INTRAMUSCULAR | Status: DC | PRN

## 2014-02-25 MED ORDER — ASPIRIN 325 MG PO TABS
325.0000 mg | ORAL_TABLET | Freq: Every day | ORAL | Status: DC
  Administered 2014-02-25 – 2014-02-26 (×2): 325 mg via ORAL
  Filled 2014-02-25 (×2): qty 1

## 2014-02-25 MED ORDER — CARVEDILOL 12.5 MG PO TABS
12.5000 mg | ORAL_TABLET | Freq: Once | ORAL | Status: AC
  Administered 2014-02-25: 12.5 mg via ORAL
  Filled 2014-02-25: qty 1

## 2014-02-25 MED ORDER — PROPOFOL 10 MG/ML IV BOLUS
INTRAVENOUS | Status: AC
  Filled 2014-02-25: qty 20

## 2014-02-25 MED ORDER — CARVEDILOL 12.5 MG PO TABS
12.5000 mg | ORAL_TABLET | Freq: Two times a day (BID) | ORAL | Status: DC
  Administered 2014-02-26: 12.5 mg via ORAL
  Filled 2014-02-25 (×3): qty 1

## 2014-02-25 MED ORDER — PROPOFOL 10 MG/ML IV BOLUS
INTRAVENOUS | Status: DC | PRN
  Administered 2014-02-25: 150 mg via INTRAVENOUS

## 2014-02-25 MED ORDER — OXYCODONE-ACETAMINOPHEN 5-325 MG PO TABS
1.0000 | ORAL_TABLET | ORAL | Status: DC | PRN
  Administered 2014-02-25: 2 via ORAL
  Filled 2014-02-25: qty 2

## 2014-02-25 MED ORDER — HYDROMORPHONE HCL 1 MG/ML IJ SOLN
0.2500 mg | INTRAMUSCULAR | Status: DC | PRN
  Administered 2014-02-25 (×4): 0.5 mg via INTRAVENOUS

## 2014-02-25 MED ORDER — ONDANSETRON HCL 4 MG/2ML IJ SOLN
INTRAMUSCULAR | Status: DC | PRN
  Administered 2014-02-25: 4 mg via INTRAVENOUS

## 2014-02-25 MED ORDER — ONDANSETRON HCL 4 MG/2ML IJ SOLN: 4.0000 mg | Freq: Once | INTRAMUSCULAR | Status: DC | PRN

## 2014-02-25 MED ORDER — THROMBIN 5000 UNITS EX SOLR
CUTANEOUS | Status: DC | PRN
  Administered 2014-02-25: 10000 [IU] via TOPICAL

## 2014-02-25 MED ORDER — METHOCARBAMOL 1000 MG/10ML IJ SOLN
500.0000 mg | Freq: Four times a day (QID) | INTRAVENOUS | Status: DC | PRN
  Administered 2014-02-25: 500 mg via INTRAVENOUS
  Filled 2014-02-25: qty 5

## 2014-02-25 SURGICAL SUPPLY — 49 items
BAG ZIPLOCK 12X15 (MISCELLANEOUS) IMPLANT
BLADE OSCILLATING/SAGITTAL (BLADE) ×2
BLADE SW THK.38XMED LNG THN (BLADE) ×1 IMPLANT
BNDG COHESIVE 6X5 TAN NS LF (GAUZE/BANDAGES/DRESSINGS) ×3 IMPLANT
BUR OVAL CARBIDE 4.0 (BURR) ×3 IMPLANT
CLEANER TIP ELECTROSURG 2X2 (MISCELLANEOUS) ×3 IMPLANT
DERMABOND ADVANCED (GAUZE/BANDAGES/DRESSINGS) ×2
DERMABOND ADVANCED .7 DNX12 (GAUZE/BANDAGES/DRESSINGS) ×1 IMPLANT
DRAPE POUCH INSTRU U-SHP 10X18 (DRAPES) ×3 IMPLANT
DRSG ADAPTIC 3X8 NADH LF (GAUZE/BANDAGES/DRESSINGS) ×3 IMPLANT
DRSG AQUACEL AG ADV 3.5X 6 (GAUZE/BANDAGES/DRESSINGS) IMPLANT
DURAPREP 26ML APPLICATOR (WOUND CARE) ×3 IMPLANT
ELECT REM PT RETURN 9FT ADLT (ELECTROSURGICAL) ×3
ELECTRODE REM PT RTRN 9FT ADLT (ELECTROSURGICAL) ×1 IMPLANT
GAUZE SPONGE 4X4 12PLY STRL (GAUZE/BANDAGES/DRESSINGS) ×3 IMPLANT
GLOVE BIOGEL PI IND STRL 6.5 (GLOVE) ×1 IMPLANT
GLOVE BIOGEL PI IND STRL 8 (GLOVE) ×1 IMPLANT
GLOVE BIOGEL PI INDICATOR 6.5 (GLOVE) ×2
GLOVE BIOGEL PI INDICATOR 8 (GLOVE) ×2
GLOVE ECLIPSE 8.0 STRL XLNG CF (GLOVE) ×3 IMPLANT
GLOVE SURG SS PI 6.5 STRL IVOR (GLOVE) ×3 IMPLANT
GOWN STRL REUS W/TWL LRG LVL3 (GOWN DISPOSABLE) ×3 IMPLANT
GOWN STRL REUS W/TWL XL LVL3 (GOWN DISPOSABLE) ×3 IMPLANT
IMMOBILIZER SHOULDER SM CANVAS (SOFTGOODS) ×3 IMPLANT
KIT BASIN OR (CUSTOM PROCEDURE TRAY) ×3 IMPLANT
KIT POSITION SHOULDER SCHLEI (MISCELLANEOUS) ×3 IMPLANT
MANIFOLD NEPTUNE II (INSTRUMENTS) ×3 IMPLANT
NEEDLE MA TROC 1/2 (NEEDLE) IMPLANT
NS IRRIG 1000ML POUR BTL (IV SOLUTION) IMPLANT
PACK SHOULDER CUSTOM OPM052 (CUSTOM PROCEDURE TRAY) ×3 IMPLANT
PAD ABD 8X10 STRL (GAUZE/BANDAGES/DRESSINGS) ×3 IMPLANT
PATCH TISSUE MEND 5X6CM (Orthopedic Implant) ×3 IMPLANT
POSITIONER SURGICAL ARM (MISCELLANEOUS) ×3 IMPLANT
SLING ARM IMMOBILIZER LRG (SOFTGOODS) IMPLANT
SPONGE LAP 4X18 X RAY DECT (DISPOSABLE) IMPLANT
SPONGE SURGIFOAM ABS GEL 100 (HEMOSTASIS) ×3 IMPLANT
STAPLER VISISTAT 35W (STAPLE) ×3 IMPLANT
SUCTION FRAZIER 12FR DISP (SUCTIONS) ×3 IMPLANT
SUT BONE WAX W31G (SUTURE) ×3 IMPLANT
SUT ETHIBOND NAB CT1 #1 30IN (SUTURE) ×6 IMPLANT
SUT MNCRL AB 4-0 PS2 18 (SUTURE) ×3 IMPLANT
SUT VIC AB 0 CT1 27 (SUTURE)
SUT VIC AB 0 CT1 27XBRD ANTBC (SUTURE) IMPLANT
SUT VIC AB 1 CT1 27 (SUTURE) ×4
SUT VIC AB 1 CT1 27XBRD ANTBC (SUTURE) ×2 IMPLANT
SUT VIC AB 2-0 CT1 27 (SUTURE) ×4
SUT VIC AB 2-0 CT1 TAPERPNT 27 (SUTURE) ×2 IMPLANT
TAPE CLOTH SURG 6X10 WHT LF (GAUZE/BANDAGES/DRESSINGS) ×3 IMPLANT
TOWEL OR 17X26 10 PK STRL BLUE (TOWEL DISPOSABLE) ×3 IMPLANT

## 2014-02-25 NOTE — OR Nursing (Signed)
Note Internet went down just prior to case beginning time, case information was taken on downtime paper chart then entered to electronic chart after case was finished.

## 2014-02-25 NOTE — Transfer of Care (Signed)
Immediate Anesthesia Transfer of Care Note  Patient: Victor [name]  Procedure(s) Performed: Procedure(s) (LRB): LEFT ROTATOR CUFF REPAIR SHOULDER OPEN WITH POSSIBLE GRAFT AND ANCHORS (Left)  Patient Location: PACU  Anesthesia Type: General  Level of Consciousness: sedated, patient cooperative and responds to stimulation  Airway & Oxygen Therapy: Patient Spontanous Breathing and Patient connected to face mask oxgen  Post-op Assessment: Report given to PACU RN and Post -op Vital signs reviewed and stable  Post vital signs: Reviewed and stable  Complications: No apparent anesthesia complications

## 2014-02-25 NOTE — Progress Notes (Signed)
Dr. Jillyn Hidden in to see patient- listened to his lungs-clear- patient states he is breathing better.

## 2014-02-25 NOTE — Progress Notes (Signed)
Dr. Jillyn Hidden talked with patient- O.K. To go to floor

## 2014-02-25 NOTE — Anesthesia Postprocedure Evaluation (Signed)
  Anesthesia Post-op Note  Patient: Victor [name]  Procedure(s) Performed: Procedure(s) (LRB): LEFT ROTATOR CUFF REPAIR SHOULDER OPEN WITH POSSIBLE GRAFT AND ANCHORS (Left)  Patient Location: PACU  Anesthesia Type: General  Level of Consciousness: awake and alert   Airway and Oxygen Therapy: Patient Spontanous Breathing  Post-op Pain: mild  Post-op Assessment: Post-op Vital signs reviewed, Patient's Cardiovascular Status Stable, Respiratory Function Stable, Patent Airway and No signs of Nausea or vomiting  Last Vitals:  Filed Vitals:   02/25/14 1654  BP: 154/100  Pulse: 90  Temp: 36.8 C  Resp: 15    Post-op Vital Signs: stable   Complications: No apparent anesthesia complications

## 2014-02-25 NOTE — Discharge Instructions (Signed)
Keep your sling on at all times, including sleeping in your sling. °The only time you should remove your sling is to shower only but you need to keep your hand against your chest while you shower.  °For the first few days, remove your dressing, tape a piece of saran wrap over your incision, take your shower, then remove the saran wrap and put a clean dressing on, then reapply your sling. °After two days you can shower without the saran wrap.  °Call Dr. Gioffre if any wound complications or temperature of 101 degrees F or over.  °Call the office for an appointment to see Dr. Gioffre in two weeks: [phone] and ask for Dr. Gioffre's nurse, Tammy Johnson.  °

## 2014-02-25 NOTE — Brief Op Note (Signed)
02/25/2014  4:48 PM  PATIENT:  Khambrel Amsden  53 y.o. male  PRE-OPERATIVE DIAGNOSIS:  left shoulder rotator cuff tear,Complex  POST-OPERATIVE DIAGNOSIS:  left shoulder rotator cuff tear,Complex  PROCEDURE:  Procedure(s): LEFT ROTATOR CUFF REPAIR SHOULDER OPEN WITH POSSIBLE GRAFT AND ANCHORS (Left),Tissue Mend Graft Used.  SURGEON:  Surgeon(s) and Role:    * Tobi Bastos, MD - Primary     ASSISTANTS:OR Tech   ANESTHESIA:   general  EBL:     BLOOD ADMINISTERED:none  DRAINS: none   LOCAL MEDICATIONS USED:  BUPIVICAINE 20cc of Exparel  SPECIMEN:  No Specimen  DISPOSITION OF SPECIMEN:  N/A  COUNTS:  YES  TOURNIQUET:  * No tourniquets in log *  DICTATION: .Other Dictation: Dictation Number [id number]  PLAN OF CARE: Admit for overnight observation  PATIENT DISPOSITION:  PACU - hemodynamically stable.   Delay start of Pharmacological VTE agent (>24hrs) due to surgical blood loss or risk of bleeding: yes

## 2014-02-25 NOTE — Interval H&P Note (Signed)
History and Physical Interval Note:  02/25/2014 2:29 PM  Victor [name]  has presented today for surgery, with the diagnosis of left shoulder rotator cuff tear  The various methods of treatment have been discussed with the patient and family. After consideration of risks, benefits and other options for treatment, the patient has consented to  Procedure(s): [place] (Left) as a surgical intervention .  The patient's history has been reviewed, patient examined, no change in status, stable for surgery.  I have reviewed the patient's chart and labs.  Questions were answered to the patient's satisfaction.     [name]

## 2014-02-25 NOTE — H&P (Signed)
Victor [name] is an 53 y.o. male.   Chief Complaint: left shoulder pain  HPI: The patient presented with the chief complaint of left shoulder pain. He reports that he has had this for the last 6 months with no known injury. He has also noticed significant stiffness in the left shoulder. MRI of the left shoulder showed that he has a near full thickness tear of the mid to anterior supraspinatus tendon from the footprint.   Past Medical History  Diagnosis Date  . Closed fracture of tibia, upper end   . Lesion of lateral popliteal nerve   . Primary localized osteoarthrosis, lower leg   . Primary localized osteoarthrosis, upper arm   . Hypertension   . Breast enlargement 08/28/2012  . Cardiomyopathy- nonischemic 08/28/2012    CATH 5/14 Normal CA  EF 30%  . Chronic systolic heart failure 1/85/6314  . Asthma     Past Surgical History  Procedure Laterality Date  . Cataract extraction  06/2012  . Leg surgries      4 surgeries  . Retinal detachment surgery  2009  . Left eye orbital bone surgery   2004     Family History  Problem Relation Age of Onset  . Kidney disease Father    Social History:  reports that he has never smoked. He has never used smokeless tobacco. He reports that he drinks alcohol. He reports that he does not use illicit drugs.  Allergies: No Known Allergies   Current outpatient prescriptions: ALPRAZolam (XANAX) 0.25 MG tablet, Take 0.25 mg by mouth daily as needed for anxiety., Disp: , Rfl: ;   aspirin 325 MG tablet, Take 325 mg by mouth daily., Disp: , Rfl: ;   carvedilol (COREG) 12.5 MG tablet, Take 12.5 mg by mouth 2 (two) times daily with a meal., Disp: , Rfl: ;   diclofenac sodium (VOLTAREN) 1 % GEL, Apply 2 g topically 4 (four) times daily., Disp: , Rfl:  Fluticasone-Salmeterol (ADVAIR) 250-50 MCG/DOSE AEPB, Inhale 1 puff into the lungs 2 (two) times daily., Disp: , Rfl: ;   furosemide (LASIX) 40 MG tablet, Take 40 mg by mouth every morning. , Disp: , Rfl: ;    levalbuterol (XOPENEX HFA) 45 MCG/ACT inhaler, Inhale 2 puffs into the lungs every 8 (eight) hours as needed for wheezing or shortness of breath. , Disp: , Rfl:  lisinopril (PRINIVIL,ZESTRIL) 20 MG tablet, Take 20 mg by mouth every morning., Disp: , Rfl: ;   meloxicam (MOBIC) 15 MG tablet, Take 15 mg by mouth daily. , Disp: , Rfl: ;   montelukast (SINGULAIR) 10 MG tablet, Take 10 mg by mouth at bedtime. , Disp: , Rfl: ;   oxyCODONE (ROXICODONE) 15 MG immediate release tablet, Take 15 mg by mouth every 6 (six) hours as needed for pain., Disp: , Rfl:  pregabalin (LYRICA) 150 MG capsule, Take 150 mg by mouth 3 (three) times daily., Disp: , Rfl: ;   sildenafil (VIAGRA) 50 MG tablet, Take 50 mg by mouth daily as needed for erectile dysfunction., Disp: , Rfl: ;   simvastatin (ZOCOR) 20 MG tablet, Take 20 mg by mouth every evening., Disp: , Rfl: ;   spironolactone (ALDACTONE) 25 MG tablet, Take 1 tablet by mouth every morning. , Disp: , Rfl:   Review of Systems  Constitutional: Negative.   HENT: Negative.   Eyes: Negative.   Respiratory: Positive for shortness of breath. Negative for cough, hemoptysis, sputum production and wheezing.        SOB  with exertion  Cardiovascular: Negative.   Gastrointestinal: Negative.   Genitourinary: Negative.   Musculoskeletal: Positive for back pain and joint pain. Negative for falls, myalgias and neck pain.       Left shoulder pain  Skin: Negative.   Neurological: Positive for tingling. Negative for dizziness, tremors, sensory change, speech change, focal weakness, seizures and loss of consciousness.  Endo/Heme/Allergies: Negative.   Psychiatric/Behavioral: Negative.    Vitals Weight: 265 lb Height: 73 in Body Surface Area: 2.49 m Body Mass Index: 34.96 kg/m Pulse: 63 (Regular) BP: 138/87 (Sitting, Left Arm, Standard)  Physical Exam  Constitutional: He is oriented to person, place, and time. He appears well-developed. No distress.  Morbidly  obese  HENT:  Head: Normocephalic and atraumatic.  Right Ear: External ear normal.  Left Ear: External ear normal.  Nose: Nose normal.  Mouth/Throat: Oropharynx is clear and moist.  Neck: Normal range of motion. Neck supple.  Cardiovascular: Normal rate, regular rhythm, normal heart sounds and intact distal pulses.   No murmur heard. Respiratory: Effort normal and breath sounds normal.  GI: Soft. Bowel sounds are normal.  Musculoskeletal:       Right shoulder: Normal.       Left shoulder: He exhibits decreased range of motion, tenderness, crepitus, pain and decreased strength.       Right elbow: Normal.      Left elbow: Normal.       Right wrist: Normal.       Left wrist: Normal.       Cervical back: Normal.  Neurological: He is alert and oriented to person, place, and time. He has normal reflexes. No sensory deficit.  Skin: No rash noted. He is not diaphoretic. No erythema.  Psychiatric: He has a normal mood and affect. His behavior is normal.     Assessment/Plan Adhesive capsulitis, left shoulder Rotator cuff tear, left shoulder He needs ans open left shoulder rotator cuff repair with acrominonectomy. We may or may not need to use a graft material that is made from calf skin. This is approved by the FDA and I have not had a rejection of that graft yet. Also, we may need to use anchors. These are polyethylene anchors that stay in the bone and we use those anchors to suture the tendon down in certain cases where the tendon is completely pulled off the bone. There is always a chance of a secondary infection obviously with any surgery but we do use antibiotics preop. Risks and benefits of the procedure were discussed with the patient by Dr. Gladstone Lighter  H&P performed by Dr. Latanya Maudlin Documented by Ardeen Jourdain, PA-C  [place], Chaston Bradburn Ander Purpura 02/25/2014, 11:16 AM

## 2014-02-25 NOTE — Progress Notes (Signed)
Patient states he feels short of breath- lungs sound clear- Dr. Jillyn Hidden in-orders given-Albuterol nebulizer treatment given as ordered.

## 2014-02-25 NOTE — Progress Notes (Signed)
Call to [facility], spoke w [place]. They will be ready for him to come over as soon as we get the prep done. Pain will be readdressed with anesthesia

## 2014-02-26 ENCOUNTER — Encounter (HOSPITAL_COMMUNITY): Payer: Self-pay | Admitting: Orthopedic Surgery

## 2014-02-26 DIAGNOSIS — M75122 Complete rotator cuff tear or rupture of left shoulder, not specified as traumatic: Secondary | ICD-10-CM | POA: Diagnosis not present

## 2014-02-26 NOTE — Op Note (Signed)
[place], PLOEGER NO.:  [id number]  MEDICAL RECORD NO.:  [id number]  LOCATION:  37                         FACILITY:  [facility]  PHYSICIAN:  Kipp Brood. Kenley Rettinger, M.D.DATE OF BIRTH:  08/02/1960  DATE OF PROCEDURE:  02/25/2014 DATE OF DISCHARGE:                              OPERATIVE REPORT   SURGEON:  Kipp Brood. Shyvonne Chastang, M.D.  ASSISTANT:  OR tech.  PREOPERATIVE DIAGNOSES: 1. Complex tear of the rotator cuff tendon on the left shoulder. 2. Severe impingement of the left shoulder. 3. Obesity.  POSTOPERATIVE DIAGNOSES: 1. Complex tear of the rotator cuff tendon on the left shoulder. 2. Severe impingement over the left shoulder. 3. Obesity.  OPERATION: 1. Open acromionectomy and acromioplasty, left shoulder. 2. Repair of the rotator cuff tendon, left shoulder utilizing a     TissueMend graft.  DESCRIPTION OF PROCEDURE:  Under general anesthesia with the patient in the beach-chair position, routine orthopedic prepping and draping of the left shoulder was carried out.  Note, the appropriate precautions were carried out according to the instructions from the cardiologist who saw him preop.  Also, the appropriate time-out was carried out.  I also marked the appropriate left shoulder in the holding area.  At this time, the patient had 3 g of IV Ancef.  An incision was made over the anterior aspect of the left shoulder.  Bleeders were identified and cauterized. I then went down directly on the acromion, separated the deltoid tendon by sharp dissection and exposed the acromion.  I then split a small proximal part of the deltoid muscle for visibility purposes.  I then removed the subdeltoid bursa.  Note, he had a severe impingement syndrome.  I protected the underlying cuff with a Bennett retractor utilizing the oscillating saw and a burr to carry out a partial acromionectomy and acromioplasty.  I thoroughly irrigated out the area, bone waxed the undersurface of  the acromion.  I then inserted some thrombin-soaked Gelfoam.  Following that, I then explored the cuff.  He had a tear and obviously the rotator cuff was repaired in the usual fashion and also utilized TissueMend graft for repair of site.  No anchors were needed.  We had nicely a stout, re-established the subacromial space.  He had a nice repair.  We irrigated the shoulder out and repacked some thrombin-soaked Gelfoam in.  I then reattached the deltoid tendon with #1 Ethibond suture.  The proximal muscle was closed with #1 Vicryl suture.  We had a nice stable closure.  I then injected 20 mL of Exparel into the soft tissue.  I aspirated during the entire, injected to make sure we were not in the vessels.  We then closed the subcu with #1 Vicryl, skin with metal staples, a sterile Neosporin dressing was applied and replaced with a shoulder immobilizer.          ______________________________ Kipp Brood Gladstone Lighter, M.D.     RAG/MEDQ  D:  02/25/2014  T:  02/25/2014  Job:  [id number]

## 2014-02-26 NOTE — Evaluation (Signed)
Occupational Therapy Evaluation Patient Details Name: Victor [name] MRN: [id number] DOB: [birth year] Today's Date: 02/26/2014    History of Present Illness pt was admitted for L RCR with graft and anchors   Clinical Impression   This 53 year old man was admitted for the above surgery.  All education was completed with pt and wife.  No further OT is needed at this time until Dr Gladstone Lighter orders further rehab.    Follow Up Recommendations   (Pt will follow up with Dr Gladstone Lighter for further rehab)    Equipment Recommendations  None recommended by OT    Recommendations for Other Services       Precautions / Restrictions Precautions Precautions: Shoulder Type of Shoulder Precautions: sling at all times except bathing/dressing Precaution Booklet Issued: Yes (comment) Restrictions LUE Weight Bearing: Non weight bearing      Mobility Bed Mobility Overal bed mobility: Modified Independent             General bed mobility comments: HOB raised  Transfers Overall transfer level: Independent Equipment used: None                  Balance Overall balance assessment: No apparent balance deficits (not formally assessed)                                          ADL Overall ADL's : Needs assistance/impaired     Grooming: Modified independent   Upper Body Bathing: Moderate assistance;Sitting   Lower Body Bathing: Moderate assistance;Sit to/from stand   Upper Body Dressing : Maximal assistance;Sitting   Lower Body Dressing: Maximal assistance;Sit to/from stand   Toilet Transfer: Independent (to recliner)             General ADL Comments: performed ADL--wife present for UB.  Reviewed protocol with pt and wife and they verbalize understanding.  Adjusted sling and wife donned this.  Wife observed shirt and verbalizes understanding.  See education section of chart     Vision                     Perception     Praxis      Pertinent  Vitals/Pain Pain Assessment: 0-10 Pain Score: 9  Pain Location: L shoulder Pain Descriptors / Indicators: Aching Pain Intervention(s): Limited activity within patient's tolerance;Monitored during session;Repositioned;Patient requesting pain meds-RN notified;Ice applied     Hand Dominance     Extremity/Trunk Assessment Upper Extremity Assessment Upper Extremity Assessment: LUE deficits/detail LUE Deficits / Details: immobilized           Communication Communication Communication: No difficulties   Cognition Arousal/Alertness: Awake/alert Behavior During Therapy: WFL for tasks assessed/performed Overall Cognitive Status: Within Functional Limits for tasks assessed                     General Comments       Exercises       Shoulder Instructions      Home Living Family/patient expects to be discharged to:: Private residence Living Arrangements: Spouse/significant other                 Bathroom Shower/Tub: Teacher, early years/pre: Standard         Additional Comments: tub has grab bar on L.  Pt will shower when wife is home      Prior Functioning/Environment Level of Independence:  Independent             OT Diagnosis: Generalized weakness;Acute pain   OT Problem List:     OT Treatment/Interventions:      OT Goals(Current goals can be found in the care plan section) Acute Rehab OT Goals Patient Stated Goal: decreased pain and improved function  OT Frequency:     Barriers to D/C:            Co-evaluation              End of Session    Activity Tolerance: Patient tolerated treatment well Patient left: in bed;in CPM;with nursing/sitter in room;with family/visitor present (EOB)   Time: 2060-1561 and 5379-4327 OT Time Calculation (min): 33 min Charges:  OT General Charges $OT Visit: 1 Procedure OT Evaluation $Initial OT Evaluation Tier I: 1 Procedure OT Treatments $Self Care/Home Management : 23-37 mins G-Codes:  OT G-codes **NOT FOR INPATIENT CLASS** Functional Assessment Tool Used: clinical observation Functional Limitation: Self care Self Care Current Status (M1470): At least 60 percent but less than 80 percent impaired, limited or restricted Self Care Goal Status (L2957): At least 60 percent but less than 80 percent impaired, limited or restricted Self Care Discharge Status [phone]): At least 60 percent but less than 80 percent impaired, limited or restricted  Makinsley Schiavi 02/26/2014, 12:23 PM   Lesle Chris, OTR/L [phone] 02/26/2014

## 2014-02-26 NOTE — Progress Notes (Signed)
Subjective: 1 Day Post-Op Procedure(s) (LRB): LEFT ROTATOR CUFF REPAIR SHOULDER OPEN WITH POSSIBLE GRAFT AND ANCHORS (Left) Patient reports pain as 5 on 0-10 scale.Doing better. Ready for DC.    Objective: Vital signs in last 24 hours: Temp:  [97.7 F (36.5 C)-99.3 F (37.4 C)] 98.8 F (37.1 C) (10/23 0435) Pulse Rate:  [76-90] 89 (10/23 0435) Resp:  [9-18] 18 (10/23 0435) BP: (118-154)/(66-113) 118/88 mmHg (10/23 0435) SpO2:  [95 %-100 %] 99 % (10/23 0435) Weight:  [122.018 kg (269 lb)] 122.018 kg (269 lb) (10/22 1833)  Intake/Output from previous day: 10/22 0701 - 10/23 0700 In: 1850 [P.O.:120; I.V.:1675; IV Piggyback:55] Out: 2 [Urine:2] Intake/Output this shift:    No results found for this basename: HGB,  in the last 72 hours No results found for this basename: WBC, RBC, HCT, PLT,  in the last 72 hours No results found for this basename: NA, K, CL, CO2, BUN, CREATININE, GLUCOSE, CALCIUM,  in the last 72 hours No results found for this basename: LABPT, INR,  in the last 72 hours  Neurovascular intact  Assessment/Plan: 1 Day Post-Op Procedure(s) (LRB): LEFT ROTATOR CUFF REPAIR SHOULDER OPEN WITH POSSIBLE GRAFT AND ANCHORS (Left) Discharge home with home health  Esti Demello A 02/26/2014, 7:14 AM

## 2014-03-01 NOTE — Discharge Summary (Signed)
Physician Discharge Summary   Patient ID: Victor [name] MRN: [id number] DOB/AGE: 1961/02/18 53 y.o.  Admit date: 02/25/2014 Discharge date: 02/26/2014  Primary Diagnosis:  Left shoulder rotator cuff tear  Admission Diagnoses:  Past Medical History  Diagnosis Date  . Closed fracture of tibia, upper end   . Lesion of lateral popliteal nerve   . Primary localized osteoarthrosis, lower leg   . Primary localized osteoarthrosis, upper arm   . Hypertension   . Breast enlargement 08/28/2012  . Cardiomyopathy- nonischemic 08/28/2012    CATH 5/14 Normal CA  EF 30%  . Chronic systolic heart failure 7/59/1638  . Asthma    Discharge Diagnoses:   Active Problems:   Rotator cuff rupture, complete  Estimated body mass index is 35.5 kg/(m^2) as calculated from the following:   Height as of this encounter: '6\' 1"'  (1.854 m).   Weight as of this encounter: 122.018 kg (269 lb).  Procedure:  Procedure(s) (LRB): LEFT ROTATOR CUFF REPAIR SHOULDER OPEN WITH  GRAFT  (Left)   Consults: None  HPI: The patient presented with the chief complaint of left shoulder pain. He reports that he has had this for the last 6 months with no known injury. He has also noticed significant stiffness in the left shoulder. MRI of the left shoulder showed that he has a near full thickness tear of the mid to anterior supraspinatus tendon from the footprint  Laboratory Data: Hospital Outpatient Visit on 02/22/2014  Component Date Value Ref Range Status  . aPTT 02/22/2014 29  24 - 37 seconds Final  . Sodium 02/22/2014 139  137 - 147 mEq/L Final  . Potassium 02/22/2014 4.3  3.7 - 5.3 mEq/L Final  . Chloride 02/22/2014 102  96 - 112 mEq/L Final  . CO2 02/22/2014 23  19 - 32 mEq/L Final  . Glucose, Bld 02/22/2014 99  70 - 99 mg/dL Final  . BUN 02/22/2014 13  6 - 23 mg/dL Final  . Creatinine, Ser 02/22/2014 1.00  0.50 - 1.35 mg/dL Final  . Calcium 02/22/2014 9.7  8.4 - 10.5 mg/dL Final  . Total Protein 02/22/2014 8.3   6.0 - 8.3 g/dL Final  . Albumin 02/22/2014 4.7  3.5 - 5.2 g/dL Final  . AST 02/22/2014 30  0 - 37 U/L Final  . ALT 02/22/2014 23  0 - 53 U/L Final  . Alkaline Phosphatase 02/22/2014 50  39 - 117 U/L Final  . Total Bilirubin 02/22/2014 1.1  0.3 - 1.2 mg/dL Final  . GFR calc non Af Amer 02/22/2014 85* >90 mL/min Final  . GFR calc Af Amer 02/22/2014 >90  >90 mL/min Final   Comment: (NOTE)                          The eGFR has been calculated using the CKD EPI equation.                          This calculation has not been validated in all clinical situations.                          eGFR's persistently <90 mL/min signify possible Chronic Kidney                          Disease.  . Anion gap 02/22/2014 14  5 - 15 Final  . Prothrombin Time 02/22/2014  14.4  11.6 - 15.2 seconds Final  . INR 02/22/2014 1.11  0.00 - 1.49 Final  . Color, Urine 02/22/2014 YELLOW  YELLOW Final  . APPearance 02/22/2014 CLEAR  CLEAR Final  . Specific Gravity, Urine 02/22/2014 1.014  1.005 - 1.030 Final  . pH 02/22/2014 5.0  5.0 - 8.0 Final  . Glucose, UA 02/22/2014 NEGATIVE  NEGATIVE mg/dL Final  . Hgb urine dipstick 02/22/2014 NEGATIVE  NEGATIVE Final  . Bilirubin Urine 02/22/2014 NEGATIVE  NEGATIVE Final  . Ketones, ur 02/22/2014 NEGATIVE  NEGATIVE mg/dL Final  . Protein, ur 02/22/2014 NEGATIVE  NEGATIVE mg/dL Final  . Urobilinogen, UA 02/22/2014 0.2  0.0 - 1.0 mg/dL Final  . Nitrite 02/22/2014 NEGATIVE  NEGATIVE Final  . Leukocytes, UA 02/22/2014 NEGATIVE  NEGATIVE Final   MICROSCOPIC NOT DONE ON URINES WITH NEGATIVE PROTEIN, BLOOD, LEUKOCYTES, NITRITE, OR GLUCOSE <1000 mg/dL.  . WBC 02/22/2014 6.4  4.0 - 10.5 K/uL Final  . RBC 02/22/2014 5.16  4.22 - 5.81 MIL/uL Final  . Hemoglobin 02/22/2014 16.0  13.0 - 17.0 g/dL Final  . HCT 02/22/2014 46.2  39.0 - 52.0 % Final  . MCV 02/22/2014 89.5  78.0 - 100.0 fL Final  . MCH 02/22/2014 31.0  26.0 - 34.0 pg Final  . MCHC 02/22/2014 34.6  30.0 - 36.0 g/dL Final  .  RDW 02/22/2014 12.5  11.5 - 15.5 % Final  . Platelets 02/22/2014 232  150 - 400 K/uL Final     X-Rays:Dg Chest 2 View  02/22/2014   CLINICAL DATA:  Preoperative for left shoulder surgery. History of hypertension and asthma.  EXAM: CHEST  2 VIEW  COMPARISON:  July 22, 2013  FINDINGS: The heart size and mediastinal contours are within normal limits. The aorta is tortuous. There is no focal infiltrate, pulmonary edema, or pleural effusion. The visualized skeletal structures are unremarkable.  IMPRESSION: No active cardiopulmonary disease.   Electronically Signed   By: Abelardo Diesel M.D.   On: 02/22/2014 10:01    EKG: Orders placed during the hospital encounter of 02/22/14  . EKG 12-LEAD  . EKG 12-LEAD     Hospital Course: Victor [name] is a 53 y.o. who was admitted to [facility]. They were brought to the operating room on 02/25/2014 and underwent Procedure(s): [place] OPEN WITH  GRAFT .  Patient tolerated the procedure well and was later transferred to the recovery room and then to the orthopaedic floor for postoperative care.  They were given PO and IV analgesics for pain control following their surgery.  They were given 24 hours of postoperative antibiotics of  Anti-infectives   Start     Dose/Rate Route Frequency Ordered Stop   02/25/14 2100  ceFAZolin (ANCEF) IVPB 1 g/50 mL premix     1 g 100 mL/hr over 30 Minutes Intravenous Every 6 hours 02/25/14 1802 02/26/14 0938   02/25/14 1545  polymyxin B 500,000 Units, bacitracin 50,000 Units in sodium chloride irrigation 0.9 % 500 mL irrigation  Status:  Discontinued       As needed 02/25/14 1704 02/25/14 1706   02/25/14 1330  ceFAZolin (ANCEF) IVPB 1 g/50 mL premix  Status:  Discontinued     1 g 100 mL/hr over 30 Minutes Intravenous  Once 02/25/14 1321 02/25/14 1802   02/25/14 0600  ceFAZolin (ANCEF) 3 g in dextrose 5 % 50 mL IVPB     3 g 160 mL/hr over 30 Minutes Intravenous On call to O.R. 02/24/14  1437 02/25/14 1525    OT were ordered. Discharge planning consulted to help with postop disposition and equipment needs.  Patient had a good night on the evening of surgery. Patient was seen in rounds and was ready to go home.   Diet: Cardiac diet Activity:Wear sling at all times Follow-up:in 2 weeks Disposition - Home Discharged Condition: stable   Discharge Instructions   Call MD / Call 911    Complete by:  As directed   If you experience chest pain or shortness of breath, CALL 911 and be transported to the hospital emergency room.  If you develope a fever above 101 F, pus (white drainage) or increased drainage or redness at the wound, or calf pain, call your surgeon's office.     Constipation Prevention    Complete by:  As directed   Drink plenty of fluids.  Prune juice may be helpful.  You may use a stool softener, such as Colace (over the counter) 100 mg twice a day.  Use MiraLax (over the counter) for constipation as needed.     Diet - low sodium heart healthy    Complete by:  As directed      Discharge instructions    Complete by:  As directed   Keep your sling on at all times, including sleeping in your sling. The only time you should remove your sling is to shower only but you need to keep your hand against your chest while you shower.  For the first few days, remove your dressing, tape a piece of saran wrap over your incision, take your shower, then remove the saran wrap and put a clean dressing on, then reapply your sling. After two days you can shower without the saran wrap.  Call Dr. Gladstone Lighter if any wound complications or temperature of 101 degrees F or over.  Call the office for an appointment to see Dr. Gladstone Lighter in two weeks: [phone] and ask for Dr. Charlestine Night nurse, Brunilda Payor.     Driving restrictions    Complete by:  As directed   No driving     Increase activity slowly as tolerated    Complete by:  As directed      Lifting restrictions    Complete by:  As  directed   No lifting            Medication List         ALPRAZolam 0.25 MG tablet  Commonly known as:  XANAX  Take 0.25 mg by mouth daily as needed for anxiety.     aspirin 325 MG tablet  Take 325 mg by mouth daily.     carvedilol 12.5 MG tablet  Commonly known as:  COREG  Take 12.5 mg by mouth 2 (two) times daily with a meal.     diclofenac sodium 1 % Gel  Commonly known as:  VOLTAREN  Apply 2 g topically 4 (four) times daily.     Fluticasone-Salmeterol 250-50 MCG/DOSE Aepb  Commonly known as:  ADVAIR  Inhale 1 puff into the lungs 2 (two) times daily.     furosemide 40 MG tablet  Commonly known as:  LASIX  Take 40 mg by mouth every morning.     levalbuterol 45 MCG/ACT inhaler  Commonly known as:  XOPENEX HFA  Inhale 2 puffs into the lungs every 8 (eight) hours as needed for wheezing or shortness of breath.     lisinopril 20 MG tablet  Commonly known as:  PRINIVIL,ZESTRIL  Take 20  mg by mouth every morning.     meloxicam 15 MG tablet  Commonly known as:  MOBIC  Take 15 mg by mouth daily.     methocarbamol 500 MG tablet  Commonly known as:  ROBAXIN  Take 1 tablet (500 mg total) by mouth every 6 (six) hours as needed for muscle spasms.     montelukast 10 MG tablet  Commonly known as:  SINGULAIR  Take 10 mg by mouth at bedtime.     oxyCODONE 5 MG immediate release tablet  Commonly known as:  Oxy IR/ROXICODONE  Take 1-3 tablets (5-15 mg total) by mouth every 4 (four) hours as needed for pain.     pregabalin 150 MG capsule  Commonly known as:  LYRICA  Take 150 mg by mouth 3 (three) times daily.     sildenafil 50 MG tablet  Commonly known as:  VIAGRA  Take 50 mg by mouth daily as needed for erectile dysfunction.     simvastatin 20 MG tablet  Commonly known as:  ZOCOR  Take 20 mg by mouth every evening.     spironolactone 25 MG tablet  Commonly known as:  ALDACTONE  Take 1 tablet by mouth every morning.           Follow-up Information   Follow  up with Leisha Trinkle A, MD. Schedule an appointment as soon as possible for a visit in 2 weeks.   Specialty:  Orthopedic Surgery   Contact information:   [address] [place] [address] [phone]       Signed: Ardeen Jourdain, PA-C Orthopaedic Surgery 03/01/2014, 9:04 AM

## 2014-03-05 ENCOUNTER — Other Ambulatory Visit: Payer: Self-pay | Admitting: Interventional Cardiology

## 2014-03-12 ENCOUNTER — Encounter: Payer: Self-pay | Admitting: Physical Medicine & Rehabilitation

## 2014-03-12 ENCOUNTER — Encounter: Payer: Medicare Other | Attending: Physical Medicine and Rehabilitation | Admitting: Physical Medicine & Rehabilitation

## 2014-03-12 ENCOUNTER — Other Ambulatory Visit: Payer: Self-pay | Admitting: Physical Medicine & Rehabilitation

## 2014-03-12 VITALS — BP 112/63 | HR 77 | Resp 16 | Ht 73.0 in | Wt 274.0 lb

## 2014-03-12 DIAGNOSIS — S8412XS Injury of peroneal nerve at lower leg level, left leg, sequela: Secondary | ICD-10-CM | POA: Diagnosis not present

## 2014-03-12 DIAGNOSIS — M1732 Unilateral post-traumatic osteoarthritis, left knee: Secondary | ICD-10-CM | POA: Diagnosis not present

## 2014-03-12 DIAGNOSIS — Z79899 Other long term (current) drug therapy: Secondary | ICD-10-CM

## 2014-03-12 DIAGNOSIS — M722 Plantar fascial fibromatosis: Secondary | ICD-10-CM | POA: Diagnosis not present

## 2014-03-12 DIAGNOSIS — Z5181 Encounter for therapeutic drug level monitoring: Secondary | ICD-10-CM | POA: Diagnosis not present

## 2014-03-12 DIAGNOSIS — S8411XA Injury of peroneal nerve at lower leg level, right leg, initial encounter: Secondary | ICD-10-CM | POA: Insufficient documentation

## 2014-03-12 DIAGNOSIS — M7522 Bicipital tendinitis, left shoulder: Secondary | ICD-10-CM | POA: Diagnosis not present

## 2014-03-12 DIAGNOSIS — G894 Chronic pain syndrome: Secondary | ICD-10-CM

## 2014-03-12 DIAGNOSIS — M75122 Complete rotator cuff tear or rupture of left shoulder, not specified as traumatic: Secondary | ICD-10-CM

## 2014-03-12 MED ORDER — PREGABALIN 150 MG PO CAPS: 150.0000 mg | ORAL_CAPSULE | Freq: Three times a day (TID) | ORAL | Status: DC

## 2014-03-12 MED ORDER — OXYCODONE HCL 15 MG PO TABS: 15.0000 mg | ORAL_TABLET | Freq: Three times a day (TID) | ORAL | Status: DC | PRN

## 2014-03-12 MED ORDER — OXYCODONE HCL 5 MG PO TABS: 5.0000 mg | ORAL_TABLET | ORAL | Status: DC | PRN

## 2014-03-12 NOTE — Patient Instructions (Signed)
PLEASE CALL ME WITH ANY PROBLEMS OR QUESTIONS (#[phone]).      

## 2014-03-12 NOTE — Progress Notes (Signed)
Subjective:    Patient ID: Victor [name], male    DOB: [birth year], 53 y.o.   MRN: [id number]  HPI   Pain Inventory Average Pain 8 Pain Right Now 8 My pain is constant, sharp, dull and aching  In the last 24 hours, has pain interfered with the following? General activity 8 Relation with others 8 Enjoyment of life 8 What TIME of day is your pain at its worst? morning,night Sleep (in general) Fair  Pain is worse with: walking, bending and standing Pain improves with: rest, heat/ice, therapy/exercise and medication Relief from Meds: 8  Mobility walk without assistance how many minutes can you walk? 10-20 ability to climb steps?  yes do you drive?  yes Do you have any goals in this area?  yes  Function disabled: date disabled 2008 retired Do you have any goals in this area?  yes  Neuro/Psych No problems in this area  Prior Studies Any changes since last visit?  yes rotator cuff surgery  Physicians involved in your care Any changes since last visit?  yes Orthopedist Dr. Gladstone Lighter   Family History  Problem Relation Age of Onset  . Kidney disease Father    History   Social History  . Marital Status: Married    Spouse Name: N/A    Number of Children: N/A  . Years of Education: N/A   Social History Main Topics  . Smoking status: Never Smoker   . Smokeless tobacco: Never Used  . Alcohol Use: Yes     Comment: raer glass of wine   . Drug Use: No  . Sexual Activity: None   Other Topics Concern  . None   Social History Narrative   Past Surgical History  Procedure Laterality Date  . Cataract extraction  06/2012  . Leg surgries      4 surgeries  . Retinal detachment surgery  2009  . Left eye orbital bone surgery   2004   . Shoulder open rotator cuff repair Left 02/25/2014    Procedure: LEFT ROTATOR CUFF REPAIR SHOULDER OPEN WITH  GRAFT ;  Surgeon: Tobi Bastos, MD;  Location: [facility];  Service: Orthopedics;  Laterality: Left;   Past Medical  History  Diagnosis Date  . Closed fracture of tibia, upper end   . Lesion of lateral popliteal nerve   . Primary localized osteoarthrosis, lower leg   . Primary localized osteoarthrosis, upper arm   . Hypertension   . Breast enlargement 08/28/2012  . Cardiomyopathy- nonischemic 08/28/2012    CATH 5/14 Normal CA  EF 30%  . Chronic systolic heart failure 8/76/8115  . Asthma    BP 112/63 mmHg  Pulse 77  Resp 16  Ht 6\' 1"  (1.854 m)  Wt 274 lb (124.286 kg)  BMI 36.16 kg/m2  SpO2 95%  Opioid Risk Score:   Fall Risk Score: Low Fall Risk (0-5 points)   Review of Systems     Objective:   Physical Exam  Constitutional: He is oriented to person, place, and time. He appears well-developed and well-nourished.  HENT:  Head: Normocephalic and atraumatic.  Eyes: Conjunctivae and EOM are normal. Pupils are equal, round, and reactive to light.  Neck: Normal range of motion.  Cardiovascular: Normal rate and regular rhythm.  Pulmonary/Chest: Effort normal and breath sounds normal.  Abdominal: Soft. Bowel sounds are normal.  Musculoskeletal: Normal range of motion.  Deformities persistent around the left leg. Right knee is tender with weight bearing and ROM. He is antalgic with  weight bearing. Left shoulder in sling.  Neurological: He is alert and oriented to person, place, and time.  Chronic changes in left leg. Persistent weakness and limtied rom in the left ankle.  Skin: Skin is warm.  Psychiatric: He has a normal mood and affect. His behavior is normal. Judgment and thought content normal.   Assessment & Plan:   ASSESSMENT:  1. Left lower extremity trauma with tibial plateau fracture and distal  femur fracture with multifactorial pain and arthritis at the joint.  2. Left Peroneal nerve injury.  3. Left RTC tear/bicep: pt s/p acromionectomy and acromioplasty/rtc repair per Dr. Gladstone Lighter    PLAN:  1. Shoulder rehab per Dr. Gladstone Lighter. In sling currently 2. Continue  lyrica 150mg  t.i.d. for his neuropathic pain.  pain. This has helped quite a bit. He may continue with meloxicam and use this prn.  3 Continue Oxycodone 15 mg 1 q.8 h. p.r.n., #90.  He is not to take further rx'es from ortho for pain. We can increase his medication slightly in the short term if needed. 4. Continue left custom compression stocking/ leg sleeve. He can try coban wrap as well after wound care is complete.  6. F/u in about 1 months with NP. 30 minutes of face to face patient care time were spent during this visit. All questions were encouraged and answered. Spoke to the patient about the fact that he needs to bring his narcotic pill bottles in to the office if he expects to receive refills. He expressed an understanding.

## 2014-03-13 LAB — PMP ALCOHOL METABOLITE (ETG): Ethyl Glucuronide (EtG): NEGATIVE ng/mL

## 2014-03-17 LAB — OXYCODONE, URINE (LC/MS-MS)
Noroxycodone, Ur: 3175 ng/mL (ref <50)
Oxycodone, ur: 2890 ng/mL (ref <50)
Oxymorphone: 2731 ng/mL (ref <50)

## 2014-03-17 LAB — OPIATES/OPIOIDS (LC/MS-MS)
Codeine Urine: NEGATIVE ng/mL (ref <50)
HYDROCODONE: NEGATIVE ng/mL — AB (ref <50)
Hydromorphone: NEGATIVE ng/mL — AB (ref <50)
Morphine Urine: NEGATIVE ng/mL (ref <50)
NORHYDROCODONE, UR: NEGATIVE ng/mL — AB (ref <50)
NOROXYCODONE, UR: 3175 ng/mL (ref <50)
OXYCODONE, UR: 2890 ng/mL (ref <50)
OXYMORPHONE, URINE: 2731 ng/mL (ref <50)

## 2014-03-17 LAB — BENZODIAZEPINES (GC/LC/MS), URINE
Alprazolam metabolite (GC/LC/MS), ur confirm: 73 ng/mL — AB (ref <25)
Clonazepam metabolite (GC/LC/MS), ur confirm: NEGATIVE ng/mL (ref <25)
FLURAZEPAMU: NEGATIVE ng/mL (ref <50)
Lorazepam (GC/LC/MS), ur confirm: NEGATIVE ng/mL (ref <50)
Midazolam (GC/LC/MS), ur confirm: NEGATIVE ng/mL (ref <50)
Nordiazepam (GC/LC/MS), ur confirm: NEGATIVE ng/mL (ref <50)
OXAZEPAMU: NEGATIVE ng/mL (ref <50)
Temazepam (GC/LC/MS), ur confirm: NEGATIVE ng/mL (ref <50)
Triazolam metabolite (GC/LC/MS), ur confirm: NEGATIVE ng/mL (ref <50)

## 2014-03-18 LAB — PRESCRIPTION MONITORING PROFILE (SOLSTAS)
Amphetamine/Meth: NEGATIVE ng/mL
Barbiturate Screen, Urine: NEGATIVE ng/mL
Buprenorphine, Urine: NEGATIVE ng/mL
CANNABINOID SCRN UR: NEGATIVE ng/mL
CARISOPRODOL, URINE: NEGATIVE ng/mL
COCAINE METABOLITES: NEGATIVE ng/mL
Creatinine, Urine: 147.66 mg/dL (ref >20.0)
FENTANYL URINE: NEGATIVE ng/mL
MDMA URINE: NEGATIVE ng/mL
Meperidine, Ur: NEGATIVE ng/mL
Methadone Screen, Urine: NEGATIVE ng/mL
Nitrites, Initial: NEGATIVE ug/mL
Propoxyphene: NEGATIVE ng/mL
Tapentadol, urine: NEGATIVE ng/mL
Tramadol Scrn, Ur: NEGATIVE ng/mL
ZOLPIDEM, URINE: NEGATIVE ng/mL
pH, Initial: 4.9 pH (ref 4.5–8.9)

## 2014-03-19 DIAGNOSIS — M75102 Unspecified rotator cuff tear or rupture of left shoulder, not specified as traumatic: Secondary | ICD-10-CM | POA: Diagnosis not present

## 2014-03-24 DIAGNOSIS — M75102 Unspecified rotator cuff tear or rupture of left shoulder, not specified as traumatic: Secondary | ICD-10-CM | POA: Diagnosis not present

## 2014-03-26 DIAGNOSIS — M75102 Unspecified rotator cuff tear or rupture of left shoulder, not specified as traumatic: Secondary | ICD-10-CM | POA: Diagnosis not present

## 2014-03-30 DIAGNOSIS — R5381 Other malaise: Secondary | ICD-10-CM | POA: Diagnosis not present

## 2014-03-30 DIAGNOSIS — R42 Dizziness and giddiness: Secondary | ICD-10-CM | POA: Diagnosis not present

## 2014-03-30 DIAGNOSIS — Z114 Encounter for screening for human immunodeficiency virus [HIV]: Secondary | ICD-10-CM | POA: Diagnosis not present

## 2014-04-07 DIAGNOSIS — M75102 Unspecified rotator cuff tear or rupture of left shoulder, not specified as traumatic: Secondary | ICD-10-CM | POA: Diagnosis not present

## 2014-04-09 ENCOUNTER — Encounter: Payer: Medicare Other | Attending: Physical Medicine and Rehabilitation | Admitting: Registered Nurse

## 2014-04-09 ENCOUNTER — Encounter: Payer: Self-pay | Admitting: Registered Nurse

## 2014-04-09 VITALS — BP 120/70 | HR 74 | Resp 14 | Wt 273.6 lb

## 2014-04-09 DIAGNOSIS — M7522 Bicipital tendinitis, left shoulder: Secondary | ICD-10-CM | POA: Insufficient documentation

## 2014-04-09 DIAGNOSIS — M75122 Complete rotator cuff tear or rupture of left shoulder, not specified as traumatic: Secondary | ICD-10-CM | POA: Diagnosis not present

## 2014-04-09 DIAGNOSIS — Z5181 Encounter for therapeutic drug level monitoring: Secondary | ICD-10-CM | POA: Diagnosis not present

## 2014-04-09 DIAGNOSIS — M1732 Unilateral post-traumatic osteoarthritis, left knee: Secondary | ICD-10-CM | POA: Diagnosis not present

## 2014-04-09 DIAGNOSIS — M75102 Unspecified rotator cuff tear or rupture of left shoulder, not specified as traumatic: Secondary | ICD-10-CM | POA: Diagnosis not present

## 2014-04-09 DIAGNOSIS — Z79899 Other long term (current) drug therapy: Secondary | ICD-10-CM | POA: Diagnosis not present

## 2014-04-09 DIAGNOSIS — S8411XA Injury of peroneal nerve at lower leg level, right leg, initial encounter: Secondary | ICD-10-CM | POA: Insufficient documentation

## 2014-04-09 DIAGNOSIS — S8412XS Injury of peroneal nerve at lower leg level, left leg, sequela: Secondary | ICD-10-CM | POA: Diagnosis not present

## 2014-04-09 DIAGNOSIS — G894 Chronic pain syndrome: Secondary | ICD-10-CM | POA: Diagnosis not present

## 2014-04-09 DIAGNOSIS — M722 Plantar fascial fibromatosis: Secondary | ICD-10-CM | POA: Diagnosis not present

## 2014-04-09 MED ORDER — OXYCODONE HCL 15 MG PO TABS: 15.0000 mg | ORAL_TABLET | Freq: Three times a day (TID) | ORAL | Status: DC | PRN

## 2014-04-09 NOTE — Progress Notes (Signed)
Subjective:    Patient ID: Victor [name], male    DOB: May 26, 1960, 53 y.o.   MRN: [id number]  HPI: [name] is a 53 year old male who returns for follow up for chronic pain and medication refill. He says his pain is located in his left shoulder and left leg. He rates his pain 7. His current exercise regime is working with the physical therapist twice a week. He had surgery on 02/25/2014 Left Rotator cuff repair shoulder open with graft by Dr. Gladstone Lighter  Pain Inventory Average Pain 8 Pain Right Now 7 My pain is sharp, tingling and aching  In the last 24 hours, has pain interfered with the following? General activity 8 Relation with others 8 Enjoyment of life 8 What TIME of day is your pain at its worst? morning and night Sleep (in general) Fair  Pain is worse with: walking and bending Pain improves with: rest, heat/ice, therapy/exercise and medication Relief from Meds: 8  Mobility walk without assistance ability to climb steps?  yes  Function disabled: date disabled .  Neuro/Psych No problems in this area  Prior Studies Any changes since last visit?  no  Physicians involved in your care Any changes since last visit?  no   Family History  Problem Relation Age of Onset  . Kidney disease Father    History   Social History  . Marital Status: Married    Spouse Name: N/A    Number of Children: N/A  . Years of Education: N/A   Social History Main Topics  . Smoking status: Never Smoker   . Smokeless tobacco: Never Used  . Alcohol Use: Yes     Comment: raer glass of wine   . Drug Use: No  . Sexual Activity: None   Other Topics Concern  . None   Social History Narrative   Past Surgical History  Procedure Laterality Date  . Cataract extraction  06/2012  . Leg surgries      4 surgeries  . Retinal detachment surgery  2009  . Left eye orbital bone surgery   2004   . Shoulder open rotator cuff repair Left 02/25/2014    Procedure: LEFT ROTATOR  CUFF REPAIR SHOULDER OPEN WITH  GRAFT ;  Surgeon: Tobi Bastos, MD;  Location: [facility];  Service: Orthopedics;  Laterality: Left;   Past Medical History  Diagnosis Date  . Closed fracture of tibia, upper end   . Lesion of lateral popliteal nerve   . Primary localized osteoarthrosis, lower leg   . Primary localized osteoarthrosis, upper arm   . Hypertension   . Breast enlargement 08/28/2012  . Cardiomyopathy- nonischemic 08/28/2012    CATH 5/14 Normal CA  EF 30%  . Chronic systolic heart failure 5/63/1497  . Asthma    BP 120/70 mmHg  Pulse 74  Resp 14  Wt 273 lb 9.6 oz (124.104 kg)  SpO2 95%  Opioid Risk Score:   Fall Risk Score:  (previously educated and given handour) Review of Systems  All other systems reviewed and are negative.      Objective:   Physical Exam  Constitutional: He is oriented to person, place, and time. He appears well-developed and well-nourished.  HENT:  Head: Normocephalic and atraumatic.  Neck: Normal range of motion. Neck supple.  Cardiovascular: Normal rate and regular rhythm.   Pulmonary/Chest: Effort normal and breath sounds normal.  Musculoskeletal:  Normal Muscle Bulk and Muscle testing reveals: Upper extremities: Right Full ROM and Muscle strength  5/5 Left with Decreased ROM 100 Degrees and Muscle strength 5/5 Lower extremities: Full ROM and Muscle strength 5/5 Arises from chair with ease Narrow Based gait   Neurological: He is alert and oriented to person, place, and time.  Skin: Skin is warm and dry.  Psychiatric: He has a normal mood and affect.  Nursing note and vitals reviewed.         Assessment & Plan:  1 Left lower extremity trauma with tibial plateau fracture and distal femur fracture with multifactorial pain and arthritis at the joint. Continue with Meloxicam, Lyrica and Refilled: Oxycodone 15 mg one tablet every 6 hours as needed #90  2. Left Peroneal nerve injury. Continue Current medication regime.  3. Left biceps  tendonitis (short head) .Orthopedist Following.  S/P  Left Rotator cuff repair shoulder open with graft by Dr. Gladstone Lighter. 4. Anxiety : Continue Xanax as needed  20 minutes of face to face patient care time was spent during this visit. All questions were encouraged and answered.   F/U in 1 month.

## 2014-04-14 DIAGNOSIS — M75102 Unspecified rotator cuff tear or rupture of left shoulder, not specified as traumatic: Secondary | ICD-10-CM | POA: Diagnosis not present

## 2014-04-16 DIAGNOSIS — M75102 Unspecified rotator cuff tear or rupture of left shoulder, not specified as traumatic: Secondary | ICD-10-CM | POA: Diagnosis not present

## 2014-04-20 DIAGNOSIS — E780 Pure hypercholesterolemia: Secondary | ICD-10-CM | POA: Diagnosis not present

## 2014-04-20 DIAGNOSIS — J45909 Unspecified asthma, uncomplicated: Secondary | ICD-10-CM | POA: Diagnosis not present

## 2014-04-20 DIAGNOSIS — G8929 Other chronic pain: Secondary | ICD-10-CM | POA: Diagnosis not present

## 2014-04-20 DIAGNOSIS — M545 Low back pain: Secondary | ICD-10-CM | POA: Diagnosis not present

## 2014-04-20 DIAGNOSIS — I119 Hypertensive heart disease without heart failure: Secondary | ICD-10-CM | POA: Diagnosis not present

## 2014-04-20 DIAGNOSIS — I429 Cardiomyopathy, unspecified: Secondary | ICD-10-CM | POA: Diagnosis not present

## 2014-04-21 DIAGNOSIS — M75102 Unspecified rotator cuff tear or rupture of left shoulder, not specified as traumatic: Secondary | ICD-10-CM | POA: Diagnosis not present

## 2014-04-23 DIAGNOSIS — M75102 Unspecified rotator cuff tear or rupture of left shoulder, not specified as traumatic: Secondary | ICD-10-CM | POA: Diagnosis not present

## 2014-04-26 DIAGNOSIS — M75102 Unspecified rotator cuff tear or rupture of left shoulder, not specified as traumatic: Secondary | ICD-10-CM | POA: Diagnosis not present

## 2014-04-28 DIAGNOSIS — M75102 Unspecified rotator cuff tear or rupture of left shoulder, not specified as traumatic: Secondary | ICD-10-CM | POA: Diagnosis not present

## 2014-05-04 ENCOUNTER — Encounter (HOSPITAL_BASED_OUTPATIENT_CLINIC_OR_DEPARTMENT_OTHER): Payer: Medicare Other | Admitting: Registered Nurse

## 2014-05-04 ENCOUNTER — Encounter: Payer: Self-pay | Admitting: Registered Nurse

## 2014-05-04 VITALS — BP 130/86 | HR 106 | Resp 16

## 2014-05-04 DIAGNOSIS — G894 Chronic pain syndrome: Secondary | ICD-10-CM | POA: Diagnosis not present

## 2014-05-04 DIAGNOSIS — Z5181 Encounter for therapeutic drug level monitoring: Secondary | ICD-10-CM | POA: Diagnosis not present

## 2014-05-04 DIAGNOSIS — S8412XS Injury of peroneal nerve at lower leg level, left leg, sequela: Secondary | ICD-10-CM

## 2014-05-04 DIAGNOSIS — M1732 Unilateral post-traumatic osteoarthritis, left knee: Secondary | ICD-10-CM | POA: Diagnosis not present

## 2014-05-04 DIAGNOSIS — M75122 Complete rotator cuff tear or rupture of left shoulder, not specified as traumatic: Secondary | ICD-10-CM | POA: Diagnosis not present

## 2014-05-04 DIAGNOSIS — Z79899 Other long term (current) drug therapy: Secondary | ICD-10-CM

## 2014-05-04 MED ORDER — OXYCODONE HCL 15 MG PO TABS: 15.0000 mg | ORAL_TABLET | Freq: Three times a day (TID) | ORAL | Status: DC | PRN

## 2014-05-04 NOTE — Progress Notes (Signed)
Subjective:    Patient ID: Victor [name], male    DOB: 23-Mar-1961, 53 y.o.   MRN: [id number]  HPI: Victor [name] is a 53 year old male who returns for follow up for chronic pain and medication refill. He says his pain is located in his left shoulder, lower back and left leg. He rates his pain 7. His current exercise regime is going to [employer] 6 days a week and performing stretching exercises using the bands.  He twisted his back a week ago went to his PCP was prescribed muscle relaxer with relief noted. This morning he states after he sat down felt a tinge using heat and ice therapy for relief.  Had surgery on 02/25/2014 Left Rotator cuff repair shoulder open with graft by Dr. Gladstone Lighter   Pain Inventory Average Pain 8 Pain Right Now 7 My pain is constant, sharp, dull, tingling and aching  In the last 24 hours, has pain interfered with the following? General activity 8 Relation with others 7 Enjoyment of life 7 What TIME of day is your pain at its worst? morning and night Sleep (in general) Fair  Pain is worse with: walking, bending, standing and some activites Pain improves with: heat/ice, therapy/exercise and medication Relief from Meds: 8  Mobility walk without assistance how many minutes can you walk? 15 ability to climb steps?  no do you drive?  no  Function disabled: date disabled .  Neuro/Psych numbness  Prior Studies Any changes since last visit?  no  Physicians involved in your care Any changes since last visit?  no   Family History  Problem Relation Age of Onset  . Kidney disease Father    History   Social History  . Marital Status: Married    Spouse Name: N/A    Number of Children: N/A  . Years of Education: N/A   Social History Main Topics  . Smoking status: Never Smoker   . Smokeless tobacco: Never Used  . Alcohol Use: Yes     Comment: raer glass of wine   . Drug Use: No  . Sexual Activity: None   Other Topics Concern  . None    Social History Narrative   Past Surgical History  Procedure Laterality Date  . Cataract extraction  06/2012  . Leg surgries      4 surgeries  . Retinal detachment surgery  2009  . Left eye orbital bone surgery   2004   . Shoulder open rotator cuff repair Left 02/25/2014    Procedure: LEFT ROTATOR CUFF REPAIR SHOULDER OPEN WITH  GRAFT ;  Surgeon: Tobi Bastos, MD;  Location: [facility];  Service: Orthopedics;  Laterality: Left;   Past Medical History  Diagnosis Date  . Closed fracture of tibia, upper end   . Lesion of lateral popliteal nerve   . Primary localized osteoarthrosis, lower leg   . Primary localized osteoarthrosis, upper arm   . Hypertension   . Breast enlargement 08/28/2012  . Cardiomyopathy- nonischemic 08/28/2012    CATH 5/14 Normal CA  EF 30%  . Chronic systolic heart failure 4/68/0321  . Asthma    BP 130/86 mmHg  Pulse 106  Resp 16  SpO2 96%  Opioid Risk Score:   Fall Risk Score: Low Fall Risk (0-5 points)  Review of Systems  Constitutional: Negative.   HENT: Negative.   Eyes: Negative.   Respiratory: Negative.   Cardiovascular: Negative.   Gastrointestinal: Negative.   Endocrine: Negative.   Genitourinary: Negative.  Musculoskeletal: Positive for myalgias and joint swelling.  Skin: Negative.   Allergic/Immunologic: Negative.   Neurological: Positive for numbness.  Hematological: Negative.   Psychiatric/Behavioral: Negative.        Objective:   Physical Exam  Constitutional: He is oriented to person, place, and time. He appears well-developed and well-nourished.  HENT:  Head: Normocephalic and atraumatic.  Neck: Normal range of motion. Neck supple.  Cardiovascular: Normal rate and regular rhythm.   Pulmonary/Chest: Effort normal and breath sounds normal.  Musculoskeletal:  Normal Muscle Bulk and Muscle testing reveals: Upper Extremities: Right Full ROM and Muscle strength 5/5 Left Decreased ROM 100 Degrees and muscle strength 5/5 Lumbar  Paraspinal tenderness: L-4- L-5 Lower Extremities: Full ROM and Muscle strength 5/5 Left leg flexion Produces Pain into achilles Arises from chair with ease Narrow based Gait  Neurological: He is alert and oriented to person, place, and time.  Skin: Skin is warm and dry.  Psychiatric: He has a normal mood and affect.  Nursing note and vitals reviewed.         Assessment & Plan:  1 Left lower extremity trauma with tibial plateau fracture and distal femur fracture with multifactorial pain and arthritis at the joint. Continue with Meloxicam, Lyrica and Refilled: Oxycodone 15 mg one tablet every 6 hours as needed #90  2. Left Peroneal nerve injury. Continue Current medication regime.  3. Left biceps tendonitis (short head) .Orthopedist Following.  S/P Left Rotator cuff repair shoulder open with graft by Dr. Gladstone Lighter. 4. Anxiety : Continue Xanax as needed  20 minutes of face to face patient care time was spent during this visit. All questions were encouraged and answered.   F/U in 1 month.

## 2014-05-22 ENCOUNTER — Other Ambulatory Visit: Payer: Self-pay | Admitting: Interventional Cardiology

## 2014-05-24 DIAGNOSIS — M75102 Unspecified rotator cuff tear or rupture of left shoulder, not specified as traumatic: Secondary | ICD-10-CM | POA: Diagnosis not present

## 2014-05-24 DIAGNOSIS — Z4789 Encounter for other orthopedic aftercare: Secondary | ICD-10-CM | POA: Diagnosis not present

## 2014-06-04 ENCOUNTER — Encounter: Payer: Self-pay | Admitting: Registered Nurse

## 2014-06-04 ENCOUNTER — Encounter: Payer: Medicare Other | Attending: Physical Medicine and Rehabilitation | Admitting: Registered Nurse

## 2014-06-04 VITALS — BP 126/76 | HR 77 | Resp 14

## 2014-06-04 DIAGNOSIS — M722 Plantar fascial fibromatosis: Secondary | ICD-10-CM | POA: Diagnosis not present

## 2014-06-04 DIAGNOSIS — Z79899 Other long term (current) drug therapy: Secondary | ICD-10-CM

## 2014-06-04 DIAGNOSIS — M1732 Unilateral post-traumatic osteoarthritis, left knee: Secondary | ICD-10-CM | POA: Diagnosis not present

## 2014-06-04 DIAGNOSIS — M7522 Bicipital tendinitis, left shoulder: Secondary | ICD-10-CM | POA: Insufficient documentation

## 2014-06-04 DIAGNOSIS — S8412XS Injury of peroneal nerve at lower leg level, left leg, sequela: Secondary | ICD-10-CM

## 2014-06-04 DIAGNOSIS — Z5181 Encounter for therapeutic drug level monitoring: Secondary | ICD-10-CM

## 2014-06-04 DIAGNOSIS — S8411XA Injury of peroneal nerve at lower leg level, right leg, initial encounter: Secondary | ICD-10-CM | POA: Diagnosis not present

## 2014-06-04 DIAGNOSIS — M75122 Complete rotator cuff tear or rupture of left shoulder, not specified as traumatic: Secondary | ICD-10-CM

## 2014-06-04 DIAGNOSIS — G894 Chronic pain syndrome: Secondary | ICD-10-CM | POA: Diagnosis not present

## 2014-06-04 MED ORDER — OXYCODONE HCL 15 MG PO TABS: 15.0000 mg | ORAL_TABLET | Freq: Three times a day (TID) | ORAL | Status: DC | PRN

## 2014-06-04 MED ORDER — ALPRAZOLAM 0.5 MG PO TABS: 0.5000 mg | ORAL_TABLET | Freq: Every day | ORAL | Status: DC | PRN

## 2014-06-04 NOTE — Progress Notes (Signed)
Subjective:    Patient ID: Victor [name], male    DOB: 02-02-1961, 54 y.o.   MRN: [id number]  HPI: Victor [name] is a 54 year old male who returns for follow up for chronic pain and medication refill. He says his pain is located in his left shoulder, left knee and left ankle. He rates his pain 8. His current exercise regime is going to [employer] 6 days a week and performing stretching/core exercises using the bands. Also using the treadmill, stationary bicycle, and push-ups. Dr. Gladstone Lighter instructed no weights till May he says.  He says his PCP wanted our office to prescribed his Xanax, so all narcotics are at one office. He's using Xanas as needed basis, this medication was prescribed today.  Had surgery on 02/25/2014 Left Rotator cuff repair shoulder open with graft by Dr. Gladstone Lighter   Pain Inventory Average Pain 8 Pain Right Now 8 My pain is sharp, dull, tingling and aching  In the last 24 hours, has pain interfered with the following? General activity 8 Relation with others 7 Enjoyment of life 8 What TIME of day is your pain at its worst? morning, night Sleep (in general) Fair  Pain is worse with: walking, standing and some activites Pain improves with: rest, heat/ice, therapy/exercise and medication Relief from Meds: 8  Mobility walk without assistance how many minutes can you walk? 20 ability to climb steps?  yes do you drive?  yes Do you have any goals in this area?  yes  Function disabled: date disabled . retired Do you have any goals in this area?  yes  Neuro/Psych tingling  Prior Studies Any changes since last visit?  no  Physicians involved in your care Any changes since last visit?  no   Family History  Problem Relation Age of Onset  . Kidney disease Father    History   Social History  . Marital Status: Married    Spouse Name: N/A    Number of Children: N/A  . Years of Education: N/A   Social History Main Topics  . Smoking status:  Never Smoker   . Smokeless tobacco: Never Used  . Alcohol Use: Yes     Comment: raer glass of wine   . Drug Use: No  . Sexual Activity: None   Other Topics Concern  . None   Social History Narrative   Past Surgical History  Procedure Laterality Date  . Cataract extraction  06/2012  . Leg surgries      4 surgeries  . Retinal detachment surgery  2009  . Left eye orbital bone surgery   2004   . Shoulder open rotator cuff repair Left 02/25/2014    Procedure: LEFT ROTATOR CUFF REPAIR SHOULDER OPEN WITH  GRAFT ;  Surgeon: Tobi Bastos, MD;  Location: [facility];  Service: Orthopedics;  Laterality: Left;   Past Medical History  Diagnosis Date  . Closed fracture of tibia, upper end   . Lesion of lateral popliteal nerve   . Primary localized osteoarthrosis, lower leg   . Primary localized osteoarthrosis, upper arm   . Hypertension   . Breast enlargement 08/28/2012  . Cardiomyopathy- nonischemic 08/28/2012    CATH 5/14 Normal CA  EF 30%  . Chronic systolic heart failure 12/29/2351  . Asthma    BP 126/76 mmHg  Pulse 77  Resp 14  SpO2 94%  Opioid Risk Score:   Fall Risk Score: Low Fall Risk (0-5 points) (pt has rec'd safety pamphlet during  previous visit)   Review of Systems  Neurological:       Tingling   All other systems reviewed and are negative.      Objective:   Physical Exam  Constitutional: He is oriented to person, place, and time. He appears well-developed and well-nourished.  HENT:  Head: Normocephalic and atraumatic.  Neck: Normal range of motion. Neck supple.  Cardiovascular: Normal rate and regular rhythm.   Pulmonary/Chest: Effort normal and breath sounds normal.  Musculoskeletal:  Normal Muscle Bulk and Muscle Testing Reveals: Upper Extremities: Right: Full ROM and Muscle strength 5/5 Left: Decreased ROM 100 Degrees and Muscle strength 5/5 Lower Extremities: Full ROM and Muscle strength 5/5 Arises from chair with ease Narrow based gait   Neurological:  He is alert and oriented to person, place, and time.  Skin: Skin is warm and dry.  Psychiatric: He has a normal mood and affect.  Nursing note and vitals reviewed.         Assessment & Plan:  1 Left lower extremity trauma with tibial plateau fracture and distal femur fracture with multifactorial pain and arthritis at the joint. Continue with Meloxicam, Lyrica and  Refilled  Oxycodone 15 mg one tablet every 6 hours as needed #90  2. Left Peroneal nerve injury. Continue Current medication regime.  3. Left biceps tendonitis (short head) S/P Left Rotator cuff repair shoulder open with graft by Dr. Gladstone Lighter. 4. Anxiety : Continue Xanax as needed  20 minutes of face to face patient care time was spent during this visit. All questions were encouraged and answered.   F/U in 1 month.

## 2014-06-15 DIAGNOSIS — H2011 Chronic iridocyclitis, right eye: Secondary | ICD-10-CM | POA: Diagnosis not present

## 2014-06-15 DIAGNOSIS — H4051X3 Glaucoma secondary to other eye disorders, right eye, severe stage: Secondary | ICD-10-CM | POA: Diagnosis not present

## 2014-06-15 DIAGNOSIS — H26491 Other secondary cataract, right eye: Secondary | ICD-10-CM | POA: Diagnosis not present

## 2014-06-28 ENCOUNTER — Other Ambulatory Visit: Payer: Self-pay | Admitting: Physical Medicine & Rehabilitation

## 2014-06-29 ENCOUNTER — Encounter (HOSPITAL_BASED_OUTPATIENT_CLINIC_OR_DEPARTMENT_OTHER): Payer: Medicare Other | Attending: General Surgery

## 2014-06-29 ENCOUNTER — Other Ambulatory Visit (HOSPITAL_BASED_OUTPATIENT_CLINIC_OR_DEPARTMENT_OTHER): Payer: Self-pay | Admitting: General Surgery

## 2014-06-29 ENCOUNTER — Ambulatory Visit (HOSPITAL_COMMUNITY)
Admission: RE | Admit: 2014-06-29 | Discharge: 2014-06-29 | Disposition: A | Discharge status: Home / Self Care | Payer: Medicare Other | Source: Ambulatory Visit | Attending: General Surgery | Admitting: General Surgery

## 2014-06-29 DIAGNOSIS — S81802A Unspecified open wound, left lower leg, initial encounter: Secondary | ICD-10-CM | POA: Insufficient documentation

## 2014-06-29 DIAGNOSIS — L089 Local infection of the skin and subcutaneous tissue, unspecified: Secondary | ICD-10-CM | POA: Diagnosis not present

## 2014-06-29 DIAGNOSIS — X58XXXA Exposure to other specified factors, initial encounter: Secondary | ICD-10-CM | POA: Insufficient documentation

## 2014-06-29 DIAGNOSIS — M869 Osteomyelitis, unspecified: Secondary | ICD-10-CM

## 2014-06-29 DIAGNOSIS — L97829 Non-pressure chronic ulcer of other part of left lower leg with unspecified severity: Secondary | ICD-10-CM | POA: Diagnosis not present

## 2014-06-30 ENCOUNTER — Encounter: Payer: Medicare Other | Attending: Physical Medicine and Rehabilitation | Admitting: Registered Nurse

## 2014-06-30 ENCOUNTER — Encounter: Payer: Self-pay | Admitting: Registered Nurse

## 2014-06-30 VITALS — BP 118/72 | HR 75 | Resp 14

## 2014-06-30 DIAGNOSIS — M722 Plantar fascial fibromatosis: Secondary | ICD-10-CM | POA: Insufficient documentation

## 2014-06-30 DIAGNOSIS — M1732 Unilateral post-traumatic osteoarthritis, left knee: Secondary | ICD-10-CM | POA: Insufficient documentation

## 2014-06-30 DIAGNOSIS — S8411XA Injury of peroneal nerve at lower leg level, right leg, initial encounter: Secondary | ICD-10-CM | POA: Diagnosis not present

## 2014-06-30 DIAGNOSIS — Z5181 Encounter for therapeutic drug level monitoring: Secondary | ICD-10-CM

## 2014-06-30 DIAGNOSIS — G894 Chronic pain syndrome: Secondary | ICD-10-CM

## 2014-06-30 DIAGNOSIS — M7522 Bicipital tendinitis, left shoulder: Secondary | ICD-10-CM | POA: Diagnosis not present

## 2014-06-30 DIAGNOSIS — S8412XS Injury of peroneal nerve at lower leg level, left leg, sequela: Secondary | ICD-10-CM | POA: Diagnosis not present

## 2014-06-30 DIAGNOSIS — M75122 Complete rotator cuff tear or rupture of left shoulder, not specified as traumatic: Secondary | ICD-10-CM | POA: Diagnosis not present

## 2014-06-30 DIAGNOSIS — Z79899 Other long term (current) drug therapy: Secondary | ICD-10-CM

## 2014-06-30 MED ORDER — OXYCODONE HCL 15 MG PO TABS: 15.0000 mg | ORAL_TABLET | Freq: Three times a day (TID) | ORAL | Status: DC | PRN

## 2014-06-30 NOTE — Progress Notes (Signed)
Subjective:    Patient ID: Victor [name], male    DOB: [birth year], 54 y.o.   MRN: [id number]  HPI: Victor [name] is a 54 year old male who returns for follow up for chronic pain and medication refill. He says his pain is located in his left shoulder and left leg. He rates his pain 8. His current exercise regime is going to [employer] 6 days a week and performing stretching/core exercises using the bands. Also using the treadmill, stationary bicycle, and push-ups. Dr. Gladstone Lighter instructed no weights till May he says. He was in the shower on 06/25/2014 he noticed a small opening on his left shin which was bleeding. [facility] Lindon [facility]p following packing was performed.   Pain Inventory Average Pain 8 Pain Right Now 8 My pain is sharp, dull, tingling and aching  In the last 24 hours, has pain interfered with the following? General activity 0 Relation with others 0 Enjoyment of life 0 What TIME of day is your pain at its worst? na Sleep (in general) NA  Pain is worse with: walking, standing and some activites Pain improves with: medication Relief from Meds: na  Mobility walk with assistance how many minutes can you walk? 20 ability to climb steps?  yes do you drive?  yes  Function disabled: date disabled . retired  Neuro/Psych numbness tingling  Prior Studies Any changes since last visit?  no  Physicians involved in your care Any changes since last visit?  no   Family History  Problem Relation Age of Onset  . Kidney disease Father    History   Social History  . Marital Status: Married    Spouse Name: N/A  . Number of Children: N/A  . Years of Education: N/A   Social History Main Topics  . Smoking status: Never Smoker   . Smokeless tobacco: Never Used  . Alcohol Use: Yes     Comment: raer glass of wine   . Drug Use: No  . Sexual Activity: Not on file   Other Topics Concern  . None   Social History Narrative   Past Surgical  History  Procedure Laterality Date  . Cataract extraction  06/2012  . Leg surgries      4 surgeries  . Retinal detachment surgery  2009  . Left eye orbital bone surgery   2004   . Shoulder open rotator cuff repair Left 02/25/2014    Procedure: LEFT ROTATOR CUFF REPAIR SHOULDER OPEN WITH  GRAFT ;  Surgeon: Tobi Bastos, MD;  Location: [facility];  Service: Orthopedics;  Laterality: Left;   Past Medical History  Diagnosis Date  . Closed fracture of tibia, upper end   . Lesion of lateral popliteal nerve   . Primary localized osteoarthrosis, lower leg   . Primary localized osteoarthrosis, upper arm   . Hypertension   . Breast enlargement 08/28/2012  . Cardiomyopathy- nonischemic 08/28/2012    CATH 5/14 Normal CA  EF 30%  . Chronic systolic heart failure 8/67/6195  . Asthma    BP 118/72 mmHg  Pulse 75  Resp 14  SpO2 94%  Opioid Risk Score:   Fall Risk Score: Low Fall Risk (0-5 points) (previously educated and given handout) Review of Systems  Neurological: Positive for numbness.       Tingling  All other systems reviewed and are negative.      Objective:   Physical Exam  Constitutional: He is oriented to person, place,  and time. He appears well-developed and well-nourished.  HENT:  Head: Normocephalic and atraumatic.  Neck: Normal range of motion. Neck supple.  Cardiovascular: Normal rate and regular rhythm.   Pulmonary/Chest: Effort normal and breath sounds normal.  Musculoskeletal:  Normal Muscle Bulk and Muscle Testing Reveals: Upper Extremities: Right Full ROM and Muscle strength 5/5 Left: Decreased ROM 90 Degrees and Muscle strength 5/5 Left Spine of Scapula Tenderness Lower Extremities: Full ROM and Muscle strength 5/5 Left lower extremity band-aid intact. 0.5 cm Arises from chair with ease Narrow Based gait  Neurological: He is alert and oriented to person, place, and time.  Skin: Skin is warm and dry.  Psychiatric: He has a normal mood and affect.  Nursing  note and vitals reviewed.         Assessment & Plan:  1 Left lower extremity trauma with tibial plateau fracture and distal femur fracture with multifactorial pain and arthritis at the joint. Continue with Meloxicam, Lyrica and Refilled Oxycodone 15 mg one tablet every 8 hours as needed #90  2. Left Peroneal nerve injury. Continue Current medication regime.  3. Left biceps tendonitis (short head) S/P Left Rotator cuff repair shoulder open with graft by Dr. Gladstone Lighter. 4. Anxiety : Continue Xanax as needed  20 minutes of face to face patient care time was spent during this visit. All questions were encouraged and answered.   F/U in 1 month.

## 2014-07-06 ENCOUNTER — Other Ambulatory Visit: Payer: Self-pay | Admitting: Physical Medicine & Rehabilitation

## 2014-07-07 NOTE — Telephone Encounter (Signed)
I suggested pharmacy send this to PCP but they are rquesting this again. Shouldn't he follow up with pcp about this?

## 2014-07-08 NOTE — Addendum Note (Signed)
Addended by: Caro Hight on: 07/08/2014 01:43 PM   Modules accepted: Medications

## 2014-07-08 NOTE — Telephone Encounter (Signed)
Viagra was refilled this one time by Dr Naaman Plummer but this rx needs to be managed by PCP or urologist going forward--per Dr Naaman Plummer

## 2014-07-13 ENCOUNTER — Encounter (HOSPITAL_BASED_OUTPATIENT_CLINIC_OR_DEPARTMENT_OTHER): Payer: Medicare Other | Attending: General Surgery

## 2014-07-13 DIAGNOSIS — L97821 Non-pressure chronic ulcer of other part of left lower leg limited to breakdown of skin: Secondary | ICD-10-CM | POA: Diagnosis not present

## 2014-07-14 DIAGNOSIS — M96662 Fracture of femur following insertion of orthopedic implant, joint prosthesis, or bone plate, left leg: Secondary | ICD-10-CM | POA: Diagnosis not present

## 2014-07-26 ENCOUNTER — Encounter: Payer: Medicare Other | Attending: Physical Medicine and Rehabilitation | Admitting: Registered Nurse

## 2014-07-26 ENCOUNTER — Encounter: Payer: Self-pay | Admitting: Registered Nurse

## 2014-07-26 VITALS — BP 135/84 | HR 70 | Resp 14

## 2014-07-26 DIAGNOSIS — M722 Plantar fascial fibromatosis: Secondary | ICD-10-CM | POA: Diagnosis not present

## 2014-07-26 DIAGNOSIS — M7522 Bicipital tendinitis, left shoulder: Secondary | ICD-10-CM | POA: Insufficient documentation

## 2014-07-26 DIAGNOSIS — G894 Chronic pain syndrome: Secondary | ICD-10-CM

## 2014-07-26 DIAGNOSIS — M75122 Complete rotator cuff tear or rupture of left shoulder, not specified as traumatic: Secondary | ICD-10-CM

## 2014-07-26 DIAGNOSIS — Z79899 Other long term (current) drug therapy: Secondary | ICD-10-CM

## 2014-07-26 DIAGNOSIS — S8411XA Injury of peroneal nerve at lower leg level, right leg, initial encounter: Secondary | ICD-10-CM | POA: Diagnosis not present

## 2014-07-26 DIAGNOSIS — M1732 Unilateral post-traumatic osteoarthritis, left knee: Secondary | ICD-10-CM

## 2014-07-26 DIAGNOSIS — Z5181 Encounter for therapeutic drug level monitoring: Secondary | ICD-10-CM

## 2014-07-26 DIAGNOSIS — S8412XS Injury of peroneal nerve at lower leg level, left leg, sequela: Secondary | ICD-10-CM | POA: Diagnosis not present

## 2014-07-26 MED ORDER — OXYCODONE HCL 15 MG PO TABS: 15.0000 mg | ORAL_TABLET | Freq: Three times a day (TID) | ORAL | Status: DC | PRN

## 2014-07-26 NOTE — Progress Notes (Signed)
Subjective:    Patient ID: Victor [name], male    DOB: [birth year], 55 y.o.   MRN: [id number]  HPI: [name] is a 54 year old male who returns for follow up for chronic pain and medication refill. He says his pain is located in his left shoulder and left leg. He rates his pain 8. His current exercise regime is going to [employer] 6 days a week and performing stretching/core exercises using the bands. Also using the treadmill, stationary bicycle, and push-ups.  He says Dr. Lindon Romp from  [place] referred him to Dr. Gladstone Lighter due to osteomyelitis  ( X-ray of tibia cortex)  Also will be following with Infectious disease.  Pain Inventory Average Pain 8 Pain Right Now 8 My pain is constant, sharp, dull, tingling and aching  In the last 24 hours, has pain interfered with the following? General activity 8 Relation with others 8 Enjoyment of life 8 What TIME of day is your pain at its worst? morning and night Sleep (in general) Fair  Pain is worse with: walking and standing Pain improves with: rest, heat/ice, therapy/exercise, pacing activities and medication Relief from Meds: 8  Mobility walk without assistance how many minutes can you walk? 20 ability to climb steps?  yes do you drive?  yes  Function retired  Neuro/Psych numbness spasms  Prior Studies Any changes since last visit?  no  Physicians involved in your care Any changes since last visit?  no   Family History  Problem Relation Age of Onset  . Kidney disease Father    History   Social History  . Marital Status: Married    Spouse Name: N/A  . Number of Children: N/A  . Years of Education: N/A   Social History Main Topics  . Smoking status: Never Smoker   . Smokeless tobacco: Never Used  . Alcohol Use: Yes     Comment: raer glass of wine   . Drug Use: No  . Sexual Activity: Not on file   Other Topics Concern  . None   Social History Narrative   Past Surgical History  Procedure  Laterality Date  . Cataract extraction  06/2012  . Leg surgries      4 surgeries  . Retinal detachment surgery  2009  . Left eye orbital bone surgery   2004   . Shoulder open rotator cuff repair Left 02/25/2014    Procedure: LEFT ROTATOR CUFF REPAIR SHOULDER OPEN WITH  GRAFT ;  Surgeon: Tobi Bastos, MD;  Location: [facility];  Service: Orthopedics;  Laterality: Left;   Past Medical History  Diagnosis Date  . Closed fracture of tibia, upper end   . Lesion of lateral popliteal nerve   . Primary localized osteoarthrosis, lower leg   . Primary localized osteoarthrosis, upper arm   . Hypertension   . Breast enlargement 08/28/2012  . Cardiomyopathy- nonischemic 08/28/2012    CATH 5/14 Normal CA  EF 30%  . Chronic systolic heart failure 08/13/8117  . Asthma    Pulse 70  Resp 14  SpO2 96%  Opioid Risk Score:   Fall Risk Score: Low Fall Risk (0-5 points)`1  Depression screen PHQ 2/9  Depression screen PHQ 2/9 07/26/2014  Decreased Interest 1  Down, Depressed, Hopeless 0  PHQ - 2 Score 1  Altered sleeping 1  Tired, decreased energy 0  Change in appetite 0  Feeling bad or failure about yourself  0  Trouble concentrating 0  Moving slowly  or fidgety/restless 0  Suicidal thoughts 0  PHQ-9 Score 2     Review of Systems  Constitutional: Negative.   HENT: Negative.   Eyes: Negative.   Respiratory: Negative.   Cardiovascular: Negative.   Gastrointestinal: Negative.   Endocrine: Negative.   Genitourinary: Negative.   Musculoskeletal: Positive for arthralgias.       Left knee, ankle and shoulder pain  Allergic/Immunologic: Negative.   Neurological: Positive for numbness.  Hematological: Negative.   Psychiatric/Behavioral: Negative.        Objective:   Physical Exam  Constitutional: He is oriented to person, place, and time. He appears well-developed and well-nourished.  HENT:  Head: Normocephalic and atraumatic.  Neck: Normal range of motion. Neck supple.    Cardiovascular: Normal rate and regular rhythm.   Pulmonary/Chest: Effort normal and breath sounds normal.  Musculoskeletal:  Normal Muscle Bulk and Muscle Testing Reveals: Upper Extremities: Full ROM and Muscle Strength 5/5 Lower Extremities: Full ROM and Muscle strength 5/5 Left Lower Extremity shin with dressing intact. Arises from chair with ease Narrow Based gait  Neurological: He is alert and oriented to person, place, and time.  Skin: Skin is warm and dry.  Psychiatric: He has a normal mood and affect.          Assessment & Plan:  1 Left lower extremity trauma with tibial plateau fracture and distal femur fracture with multifactorial pain and arthritis at the joint. Continue with Meloxicam, Lyrica and Refilled Oxycodone 15 mg one tablet every 8 hours as needed #90  2. Left Peroneal nerve injury. Continue Current medication regime.  3. Left biceps tendonitis (short head) S/P Left Rotator cuff repair shoulder open with graft by Dr. Gladstone Lighter. 4. Anxiety : Continue Xanax as needed 4. Osteomyelitis: Dr. Gladstone Lighter following awaiting appointment with Infectious Disease.  20 minutes of face to face patient care time was spent during this visit. All questions were encouraged and answered.   F/U in 1 month.

## 2014-07-29 ENCOUNTER — Ambulatory Visit: Payer: Medicare Other | Admitting: Registered Nurse

## 2014-07-29 DIAGNOSIS — M86662 Other chronic osteomyelitis, left tibia and fibula: Secondary | ICD-10-CM | POA: Diagnosis not present

## 2014-08-11 ENCOUNTER — Ambulatory Visit (INDEPENDENT_AMBULATORY_CARE_PROVIDER_SITE_OTHER): Payer: Medicare Other | Admitting: Infectious Diseases

## 2014-08-11 ENCOUNTER — Encounter: Payer: Self-pay | Admitting: Infectious Diseases

## 2014-08-11 VITALS — BP 123/83 | HR 66 | Temp 98.8°F | Ht 73.0 in | Wt 276.0 lb

## 2014-08-11 DIAGNOSIS — M869 Osteomyelitis, unspecified: Secondary | ICD-10-CM | POA: Insufficient documentation

## 2014-08-11 MED ORDER — DOXYCYCLINE MONOHYDRATE 100 MG PO TABS: 100.0000 mg | ORAL_TABLET | Freq: Two times a day (BID) | ORAL | Status: DC
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
## 2014-08-20 DIAGNOSIS — M86662 Other chronic osteomyelitis, left tibia and fibula: Secondary | ICD-10-CM | POA: Diagnosis not present

## 2014-08-24 ENCOUNTER — Other Ambulatory Visit: Payer: Self-pay | Admitting: Physical Medicine & Rehabilitation

## 2014-08-24 ENCOUNTER — Encounter: Payer: Self-pay | Admitting: Registered Nurse

## 2014-08-24 ENCOUNTER — Encounter: Payer: Medicare Other | Attending: Physical Medicine and Rehabilitation | Admitting: Registered Nurse

## 2014-08-24 VITALS — BP 127/74 | HR 72 | Resp 14

## 2014-08-24 DIAGNOSIS — Z5181 Encounter for therapeutic drug level monitoring: Secondary | ICD-10-CM

## 2014-08-24 DIAGNOSIS — Z79899 Other long term (current) drug therapy: Secondary | ICD-10-CM

## 2014-08-24 DIAGNOSIS — M1732 Unilateral post-traumatic osteoarthritis, left knee: Secondary | ICD-10-CM

## 2014-08-24 DIAGNOSIS — S8412XS Injury of peroneal nerve at lower leg level, left leg, sequela: Secondary | ICD-10-CM

## 2014-08-24 DIAGNOSIS — G894 Chronic pain syndrome: Secondary | ICD-10-CM

## 2014-08-24 DIAGNOSIS — S8411XA Injury of peroneal nerve at lower leg level, right leg, initial encounter: Secondary | ICD-10-CM | POA: Insufficient documentation

## 2014-08-24 DIAGNOSIS — M75122 Complete rotator cuff tear or rupture of left shoulder, not specified as traumatic: Secondary | ICD-10-CM | POA: Diagnosis not present

## 2014-08-24 DIAGNOSIS — M7522 Bicipital tendinitis, left shoulder: Secondary | ICD-10-CM | POA: Diagnosis not present

## 2014-08-24 DIAGNOSIS — M722 Plantar fascial fibromatosis: Secondary | ICD-10-CM | POA: Diagnosis not present

## 2014-08-24 MED ORDER — OXYCODONE HCL 15 MG PO TABS: 15.0000 mg | ORAL_TABLET | Freq: Three times a day (TID) | ORAL | Status: DC | PRN

## 2014-08-24 NOTE — Addendum Note (Signed)
Addended by: Caro Hight on: 08/24/2014 10:56 AM   Modules accepted: Orders
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
## 2014-08-25 LAB — PMP ALCOHOL METABOLITE (ETG)

## 2014-08-27 LAB — PRESCRIPTION MONITORING PROFILE (SOLSTAS)
AMPHETAMINE/METH: NEGATIVE ng/mL
BUPRENORPHINE, URINE: NEGATIVE ng/mL
Barbiturate Screen, Urine: NEGATIVE ng/mL
Benzodiazepine Screen, Urine: NEGATIVE ng/mL
CANNABINOID SCRN UR: NEGATIVE ng/mL
COCAINE METABOLITES: NEGATIVE ng/mL
Carisoprodol, Urine: NEGATIVE ng/mL
Creatinine, Urine: 194.33 mg/dL (ref >20.0)
Fentanyl, Ur: NEGATIVE ng/mL
MDMA URINE: NEGATIVE ng/mL
Meperidine, Ur: NEGATIVE ng/mL
Methadone Screen, Urine: NEGATIVE ng/mL
Nitrites, Initial: NEGATIVE ug/mL
Opiate Screen, Urine: NEGATIVE ng/mL
Oxycodone Screen, Ur: NEGATIVE ng/mL
PROPOXYPHENE: NEGATIVE ng/mL
TAPENTADOLUR: NEGATIVE ng/mL
Tramadol Scrn, Ur: NEGATIVE ng/mL
ZOLPIDEM, URINE: NEGATIVE ng/mL
pH, Initial: 5 pH (ref 4.5–8.9)

## 2014-08-27 LAB — ETHYL GLUCURONIDE, URINE
ETHYL SULFATE (ETS): 724 ng/mL — AB (ref <100)
Ethyl Glucuronide (EtG): 2412 ng/mL — ABNORMAL HIGH (ref <500)

## 2014-08-31 ENCOUNTER — Telehealth: Payer: Self-pay | Admitting: Physical Medicine & Rehabilitation

## 2014-08-31 NOTE — Telephone Encounter (Signed)
Was Mr. Sutter already on watch for prior inconsistencies/compliance issues?  ETOH level high on last UDS

## 2014-09-01 NOTE — Telephone Encounter (Signed)
I have written a formal warning letter and will mail to patient.  When he comes in for his next visit we will have him sign a new contract as his was signed in 2013 and had been updated since that time.

## 2014-09-01 NOTE — Telephone Encounter (Signed)
He has had issues being out of meds early on several occasions. I dont believe he has had ETOH in his UDS before.

## 2014-09-01 NOTE — Telephone Encounter (Signed)
Please give him a formal warning that ETOH is unacceptable and that further + test will mean cessation of narcotic rx

## 2014-09-09 NOTE — Progress Notes (Addendum)
Urine drug screen for this encounter is negative for all medication and positive for alcohol.  Dr Naaman Plummer had me issue a written warning for the presence of alcohol. Included was a warning that all pill counts must be appropriate as well. Dr Naaman Plummer did not question the absence of his medication.  He had one pill in his bottle at this visit but had no drug or metabolite of oxycodone present which would not be consistent if he was not out of medication. Continued surveillance is warranted as he has been short a day or two of meds in the past.

## 2014-09-21 ENCOUNTER — Telehealth: Payer: Self-pay | Admitting: Registered Nurse

## 2014-09-21 ENCOUNTER — Encounter: Payer: Medicare Other | Attending: Physical Medicine and Rehabilitation | Admitting: Physical Medicine & Rehabilitation

## 2014-09-21 ENCOUNTER — Encounter: Payer: Self-pay | Admitting: Physical Medicine & Rehabilitation

## 2014-09-21 VITALS — BP 125/82 | HR 63 | Resp 14

## 2014-09-21 DIAGNOSIS — S8412XS Injury of peroneal nerve at lower leg level, left leg, sequela: Secondary | ICD-10-CM | POA: Diagnosis not present

## 2014-09-21 DIAGNOSIS — N529 Male erectile dysfunction, unspecified: Secondary | ICD-10-CM | POA: Insufficient documentation

## 2014-09-21 DIAGNOSIS — M7522 Bicipital tendinitis, left shoulder: Secondary | ICD-10-CM | POA: Insufficient documentation

## 2014-09-21 DIAGNOSIS — M722 Plantar fascial fibromatosis: Secondary | ICD-10-CM | POA: Diagnosis not present

## 2014-09-21 DIAGNOSIS — S8411XA Injury of peroneal nerve at lower leg level, right leg, initial encounter: Secondary | ICD-10-CM | POA: Diagnosis not present

## 2014-09-21 DIAGNOSIS — M7582 Other shoulder lesions, left shoulder: Secondary | ICD-10-CM

## 2014-09-21 DIAGNOSIS — N528 Other male erectile dysfunction: Secondary | ICD-10-CM

## 2014-09-21 DIAGNOSIS — M1732 Unilateral post-traumatic osteoarthritis, left knee: Secondary | ICD-10-CM | POA: Diagnosis not present

## 2014-09-21 MED ORDER — OXYCODONE HCL 15 MG PO TABS: 15.0000 mg | ORAL_TABLET | Freq: Three times a day (TID) | ORAL | Status: DC | PRN

## 2014-09-21 MED ORDER — SILDENAFIL CITRATE 50 MG PO TABS: 50.0000 mg | ORAL_TABLET | Freq: Every day | ORAL | Status: DC | PRN

## 2014-09-21 NOTE — Patient Instructions (Signed)
PLEASE CALL ME WITH ANY PROBLEMS OR QUESTIONS (#[phone]).      
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
## 2014-09-21 NOTE — Telephone Encounter (Signed)
Script had to be re-printed. Old script discarded

## 2014-09-21 NOTE — Progress Notes (Signed)
New Rx issued signed by NP Danella Sensing.  The pharmacy was having problems with Dr Naaman Plummer DEA in the system so we reissued a new rx for oxycodone 15 mg tablets #90. The old rx was returned and destroyed.

## 2014-09-27 ENCOUNTER — Telehealth: Payer: Self-pay | Admitting: *Deleted

## 2014-09-27 MED ORDER — ALPRAZOLAM 0.5 MG PO TABS: 0.5000 mg | ORAL_TABLET | Freq: Every day | ORAL | Status: DC | PRN

## 2014-09-27 NOTE — Telephone Encounter (Signed)
Victor [name] is calling for a refill on his alprazolam.

## 2014-10-15 ENCOUNTER — Encounter: Payer: Self-pay | Admitting: Interventional Cardiology

## 2014-10-15 DIAGNOSIS — Z125 Encounter for screening for malignant neoplasm of prostate: Secondary | ICD-10-CM | POA: Diagnosis not present

## 2014-10-15 DIAGNOSIS — J45909 Unspecified asthma, uncomplicated: Secondary | ICD-10-CM | POA: Diagnosis not present

## 2014-10-19 ENCOUNTER — Encounter: Payer: Medicare Other | Attending: Physical Medicine and Rehabilitation | Admitting: Registered Nurse

## 2014-10-19 ENCOUNTER — Encounter: Payer: Self-pay | Admitting: Registered Nurse

## 2014-10-19 VITALS — BP 118/77 | HR 73 | Resp 14

## 2014-10-19 DIAGNOSIS — M722 Plantar fascial fibromatosis: Secondary | ICD-10-CM | POA: Insufficient documentation

## 2014-10-19 DIAGNOSIS — S8411XA Injury of peroneal nerve at lower leg level, right leg, initial encounter: Secondary | ICD-10-CM | POA: Insufficient documentation

## 2014-10-19 DIAGNOSIS — M1732 Unilateral post-traumatic osteoarthritis, left knee: Secondary | ICD-10-CM | POA: Diagnosis not present

## 2014-10-19 DIAGNOSIS — S8412XS Injury of peroneal nerve at lower leg level, left leg, sequela: Secondary | ICD-10-CM

## 2014-10-19 DIAGNOSIS — M7582 Other shoulder lesions, left shoulder: Secondary | ICD-10-CM | POA: Diagnosis not present

## 2014-10-19 DIAGNOSIS — M7522 Bicipital tendinitis, left shoulder: Secondary | ICD-10-CM | POA: Insufficient documentation

## 2014-10-19 MED ORDER — OXYCODONE HCL 15 MG PO TABS: 15.0000 mg | ORAL_TABLET | Freq: Three times a day (TID) | ORAL | Status: DC | PRN
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
## 2014-10-29 DIAGNOSIS — H4051X3 Glaucoma secondary to other eye disorders, right eye, severe stage: Secondary | ICD-10-CM | POA: Diagnosis not present

## 2014-10-29 DIAGNOSIS — H2011 Chronic iridocyclitis, right eye: Secondary | ICD-10-CM | POA: Diagnosis not present

## 2014-11-17 ENCOUNTER — Ambulatory Visit (INDEPENDENT_AMBULATORY_CARE_PROVIDER_SITE_OTHER): Payer: Medicare Other | Admitting: Infectious Diseases

## 2014-11-17 ENCOUNTER — Encounter: Payer: Self-pay | Admitting: Infectious Diseases

## 2014-11-17 VITALS — BP 132/83 | HR 80 | Temp 98.1°F | Wt 256.0 lb

## 2014-11-17 DIAGNOSIS — M869 Osteomyelitis, unspecified: Secondary | ICD-10-CM | POA: Diagnosis not present

## 2014-11-17 NOTE — Assessment & Plan Note (Signed)
Will continue him on doxy for another 3 months.  At that will consider rechecking his palin films or MRI depending on his sx.  He appears to be tolerating doxy well.
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
## 2014-11-18 ENCOUNTER — Encounter: Payer: Medicare Other | Attending: Physical Medicine and Rehabilitation | Admitting: Registered Nurse

## 2014-11-18 ENCOUNTER — Encounter: Payer: Self-pay | Admitting: Registered Nurse

## 2014-11-18 VITALS — BP 134/86 | HR 77 | Resp 14

## 2014-11-18 DIAGNOSIS — S8411XA Injury of peroneal nerve at lower leg level, right leg, initial encounter: Secondary | ICD-10-CM | POA: Insufficient documentation

## 2014-11-18 DIAGNOSIS — Z79899 Other long term (current) drug therapy: Secondary | ICD-10-CM

## 2014-11-18 DIAGNOSIS — M7522 Bicipital tendinitis, left shoulder: Secondary | ICD-10-CM | POA: Insufficient documentation

## 2014-11-18 DIAGNOSIS — Z5181 Encounter for therapeutic drug level monitoring: Secondary | ICD-10-CM | POA: Diagnosis not present

## 2014-11-18 DIAGNOSIS — M722 Plantar fascial fibromatosis: Secondary | ICD-10-CM | POA: Insufficient documentation

## 2014-11-18 DIAGNOSIS — M1732 Unilateral post-traumatic osteoarthritis, left knee: Secondary | ICD-10-CM | POA: Diagnosis not present

## 2014-11-18 DIAGNOSIS — M7582 Other shoulder lesions, left shoulder: Secondary | ICD-10-CM

## 2014-11-18 DIAGNOSIS — S8412XS Injury of peroneal nerve at lower leg level, left leg, sequela: Secondary | ICD-10-CM | POA: Diagnosis not present

## 2014-11-18 MED ORDER — OXYCODONE HCL 15 MG PO TABS: 15.0000 mg | ORAL_TABLET | Freq: Three times a day (TID) | ORAL | Status: DC | PRN
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
## 2014-12-10 ENCOUNTER — Other Ambulatory Visit: Payer: Self-pay | Admitting: *Deleted

## 2014-12-10 MED ORDER — FUROSEMIDE 40 MG PO TABS: 40.0000 mg | ORAL_TABLET | Freq: Every morning | ORAL | Status: DC

## 2014-12-10 MED ORDER — SPIRONOLACTONE 25 MG PO TABS: 25.0000 mg | ORAL_TABLET | Freq: Every morning | ORAL | Status: DC

## 2014-12-15 ENCOUNTER — Other Ambulatory Visit: Payer: Self-pay | Admitting: *Deleted

## 2014-12-15 MED ORDER — LISINOPRIL 20 MG PO TABS: 20.0000 mg | ORAL_TABLET | Freq: Every day | ORAL | Status: DC

## 2014-12-16 ENCOUNTER — Encounter: Payer: Self-pay | Admitting: Registered Nurse

## 2014-12-16 ENCOUNTER — Encounter: Payer: Medicare Other | Attending: Physical Medicine and Rehabilitation | Admitting: Registered Nurse

## 2014-12-16 ENCOUNTER — Other Ambulatory Visit: Payer: Self-pay | Admitting: Registered Nurse

## 2014-12-16 VITALS — BP 140/82 | HR 72

## 2014-12-16 DIAGNOSIS — Z5181 Encounter for therapeutic drug level monitoring: Secondary | ICD-10-CM

## 2014-12-16 DIAGNOSIS — M7522 Bicipital tendinitis, left shoulder: Secondary | ICD-10-CM | POA: Diagnosis not present

## 2014-12-16 DIAGNOSIS — M722 Plantar fascial fibromatosis: Secondary | ICD-10-CM | POA: Insufficient documentation

## 2014-12-16 DIAGNOSIS — S8411XA Injury of peroneal nerve at lower leg level, right leg, initial encounter: Secondary | ICD-10-CM | POA: Insufficient documentation

## 2014-12-16 DIAGNOSIS — M1732 Unilateral post-traumatic osteoarthritis, left knee: Secondary | ICD-10-CM

## 2014-12-16 DIAGNOSIS — M7582 Other shoulder lesions, left shoulder: Secondary | ICD-10-CM | POA: Diagnosis not present

## 2014-12-16 DIAGNOSIS — Z79899 Other long term (current) drug therapy: Secondary | ICD-10-CM | POA: Diagnosis not present

## 2014-12-16 DIAGNOSIS — S8412XS Injury of peroneal nerve at lower leg level, left leg, sequela: Secondary | ICD-10-CM | POA: Diagnosis not present

## 2014-12-16 MED ORDER — ALPRAZOLAM 0.5 MG PO TABS: 0.5000 mg | ORAL_TABLET | Freq: Every day | ORAL | Status: DC | PRN

## 2014-12-16 MED ORDER — OXYCODONE HCL 15 MG PO TABS: 15.0000 mg | ORAL_TABLET | Freq: Three times a day (TID) | ORAL | Status: DC | PRN
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
## 2014-12-17 LAB — PMP ALCOHOL METABOLITE (ETG): Ethyl Glucuronide (EtG): NEGATIVE ng/mL

## 2014-12-21 LAB — BENZODIAZEPINES (GC/LC/MS), URINE
Alprazolam metabolite (GC/LC/MS), ur confirm: 105 ng/mL (ref <25)
CLONAZEPAU: NEGATIVE ng/mL (ref <25)
Flurazepam metabolite (GC/LC/MS), ur confirm: NEGATIVE ng/mL (ref <50)
Lorazepam (GC/LC/MS), ur confirm: NEGATIVE ng/mL (ref <50)
Midazolam (GC/LC/MS), ur confirm: NEGATIVE ng/mL (ref <50)
NORDIAZEPAMU: NEGATIVE ng/mL (ref <50)
Oxazepam (GC/LC/MS), ur confirm: NEGATIVE ng/mL (ref <50)
TRIAZOLAMU: NEGATIVE ng/mL (ref <50)
Temazepam (GC/LC/MS), ur confirm: NEGATIVE ng/mL (ref <50)

## 2014-12-21 LAB — OXYCODONE, URINE (LC/MS-MS)
Noroxycodone, Ur: 774 ng/mL (ref <50)
Oxycodone, ur: 676 ng/mL (ref <50)
Oxymorphone: 377 ng/mL (ref <50)

## 2014-12-21 LAB — PRESCRIPTION MONITORING PROFILE (SOLSTAS)
Amphetamine/Meth: NEGATIVE ng/mL
BARBITURATE SCREEN, URINE: NEGATIVE ng/mL
Buprenorphine, Urine: NEGATIVE ng/mL
Cannabinoid Scrn, Ur: NEGATIVE ng/mL
Carisoprodol, Urine: NEGATIVE ng/mL
Cocaine Metabolites: NEGATIVE ng/mL
Creatinine, Urine: 132.37 mg/dL (ref >20.0)
FENTANYL URINE: NEGATIVE ng/mL
MDMA URINE: NEGATIVE ng/mL
METHADONE SCREEN, URINE: NEGATIVE ng/mL
Meperidine, Ur: NEGATIVE ng/mL
Nitrites, Initial: NEGATIVE ug/mL
OPIATE SCREEN, URINE: NEGATIVE ng/mL
PROPOXYPHENE: NEGATIVE ng/mL
TAPENTADOLUR: NEGATIVE ng/mL
Tramadol Scrn, Ur: NEGATIVE ng/mL
Zolpidem, Urine: NEGATIVE ng/mL
pH, Initial: 5.1 pH (ref 4.5–8.9)

## 2014-12-24 NOTE — Progress Notes (Signed)
Urine drug screen for this encounter is consistent for prescribed medication 

## 2014-12-28 DIAGNOSIS — H43811 Vitreous degeneration, right eye: Secondary | ICD-10-CM | POA: Diagnosis not present

## 2014-12-28 DIAGNOSIS — H26491 Other secondary cataract, right eye: Secondary | ICD-10-CM | POA: Diagnosis not present

## 2015-01-06 DIAGNOSIS — H2640 Unspecified secondary cataract: Secondary | ICD-10-CM | POA: Diagnosis not present

## 2015-01-06 DIAGNOSIS — H26491 Other secondary cataract, right eye: Secondary | ICD-10-CM | POA: Diagnosis not present

## 2015-01-12 ENCOUNTER — Encounter: Payer: Self-pay | Admitting: *Deleted

## 2015-01-12 ENCOUNTER — Other Ambulatory Visit: Payer: Self-pay | Admitting: Physician Assistant

## 2015-01-12 ENCOUNTER — Ambulatory Visit
Admission: RE | Admit: 2015-01-12 | Discharge: 2015-01-12 | Disposition: A | Discharge status: Home / Self Care | Payer: Medicare Other | Source: Ambulatory Visit | Attending: Physician Assistant | Admitting: Physician Assistant

## 2015-01-12 DIAGNOSIS — J45909 Unspecified asthma, uncomplicated: Secondary | ICD-10-CM | POA: Diagnosis not present

## 2015-01-12 DIAGNOSIS — R05 Cough: Secondary | ICD-10-CM

## 2015-01-12 DIAGNOSIS — R059 Cough, unspecified: Secondary | ICD-10-CM

## 2015-01-12 DIAGNOSIS — R0602 Shortness of breath: Secondary | ICD-10-CM | POA: Diagnosis not present

## 2015-01-13 ENCOUNTER — Encounter: Payer: Self-pay | Admitting: Registered Nurse

## 2015-01-13 ENCOUNTER — Encounter: Payer: Medicare Other | Attending: Physical Medicine and Rehabilitation | Admitting: Registered Nurse

## 2015-01-13 VITALS — BP 128/83 | HR 75 | Resp 16

## 2015-01-13 DIAGNOSIS — M722 Plantar fascial fibromatosis: Secondary | ICD-10-CM | POA: Diagnosis not present

## 2015-01-13 DIAGNOSIS — Z79899 Other long term (current) drug therapy: Secondary | ICD-10-CM | POA: Diagnosis not present

## 2015-01-13 DIAGNOSIS — S8411XA Injury of peroneal nerve at lower leg level, right leg, initial encounter: Secondary | ICD-10-CM | POA: Diagnosis not present

## 2015-01-13 DIAGNOSIS — M7522 Bicipital tendinitis, left shoulder: Secondary | ICD-10-CM | POA: Insufficient documentation

## 2015-01-13 DIAGNOSIS — Z5181 Encounter for therapeutic drug level monitoring: Secondary | ICD-10-CM

## 2015-01-13 DIAGNOSIS — M1732 Unilateral post-traumatic osteoarthritis, left knee: Secondary | ICD-10-CM | POA: Diagnosis not present

## 2015-01-13 DIAGNOSIS — M7582 Other shoulder lesions, left shoulder: Secondary | ICD-10-CM

## 2015-01-13 DIAGNOSIS — S8412XS Injury of peroneal nerve at lower leg level, left leg, sequela: Secondary | ICD-10-CM

## 2015-01-13 MED ORDER — OXYCODONE HCL 15 MG PO TABS: 15.0000 mg | ORAL_TABLET | Freq: Three times a day (TID) | ORAL | Status: DC | PRN
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
## 2015-01-14 ENCOUNTER — Ambulatory Visit: Payer: Medicare Other | Admitting: Interventional Cardiology

## 2015-01-14 ENCOUNTER — Encounter: Payer: Self-pay | Admitting: Interventional Cardiology

## 2015-01-14 ENCOUNTER — Ambulatory Visit (INDEPENDENT_AMBULATORY_CARE_PROVIDER_SITE_OTHER): Payer: Medicare Other | Admitting: Interventional Cardiology

## 2015-01-14 VITALS — BP 124/60 | HR 80 | Ht 73.0 in | Wt 274.0 lb

## 2015-01-14 DIAGNOSIS — E785 Hyperlipidemia, unspecified: Secondary | ICD-10-CM | POA: Diagnosis not present

## 2015-01-14 DIAGNOSIS — I429 Cardiomyopathy, unspecified: Secondary | ICD-10-CM | POA: Diagnosis not present

## 2015-01-14 DIAGNOSIS — I10 Essential (primary) hypertension: Secondary | ICD-10-CM

## 2015-01-14 DIAGNOSIS — I5022 Chronic systolic (congestive) heart failure: Secondary | ICD-10-CM | POA: Diagnosis not present

## 2015-01-14 NOTE — Patient Instructions (Signed)
Medication Instructions:  None  Labwork: None  Testing/Procedures: None  Follow-Up: Your physician wants you to follow-up in: 1 year with Dr. Varanasi.  You will receive a reminder letter in the mail two months in advance. If you don't receive a letter, please call our office to schedule the follow-up appointment.    Any Other Special Instructions Will Be Listed Below (If Applicable).   
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
## 2015-02-03 ENCOUNTER — Encounter (HOSPITAL_BASED_OUTPATIENT_CLINIC_OR_DEPARTMENT_OTHER): Payer: Medicare Other | Admitting: Registered Nurse

## 2015-02-03 ENCOUNTER — Encounter: Payer: Self-pay | Admitting: Registered Nurse

## 2015-02-03 VITALS — BP 128/87 | HR 71 | Resp 14

## 2015-02-03 DIAGNOSIS — S8412XS Injury of peroneal nerve at lower leg level, left leg, sequela: Secondary | ICD-10-CM | POA: Diagnosis not present

## 2015-02-03 DIAGNOSIS — Z5181 Encounter for therapeutic drug level monitoring: Secondary | ICD-10-CM

## 2015-02-03 DIAGNOSIS — M722 Plantar fascial fibromatosis: Secondary | ICD-10-CM | POA: Diagnosis not present

## 2015-02-03 DIAGNOSIS — S8411XA Injury of peroneal nerve at lower leg level, right leg, initial encounter: Secondary | ICD-10-CM | POA: Diagnosis not present

## 2015-02-03 DIAGNOSIS — M1732 Unilateral post-traumatic osteoarthritis, left knee: Secondary | ICD-10-CM | POA: Diagnosis not present

## 2015-02-03 DIAGNOSIS — Z79899 Other long term (current) drug therapy: Secondary | ICD-10-CM

## 2015-02-03 DIAGNOSIS — M7522 Bicipital tendinitis, left shoulder: Secondary | ICD-10-CM | POA: Diagnosis not present

## 2015-02-03 DIAGNOSIS — M7582 Other shoulder lesions, left shoulder: Secondary | ICD-10-CM

## 2015-02-03 MED ORDER — OXYCODONE HCL 15 MG PO TABS: 15.0000 mg | ORAL_TABLET | Freq: Three times a day (TID) | ORAL | Status: DC | PRN
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
## 2015-02-10 ENCOUNTER — Ambulatory Visit: Payer: Medicare Other | Admitting: Registered Nurse

## 2015-02-21 ENCOUNTER — Ambulatory Visit: Payer: Medicare Other | Admitting: Infectious Diseases

## 2015-02-23 ENCOUNTER — Other Ambulatory Visit: Payer: Self-pay | Admitting: Interventional Cardiology

## 2015-02-24 ENCOUNTER — Telehealth: Payer: Self-pay | Admitting: Interventional Cardiology

## 2015-02-24 DIAGNOSIS — E785 Hyperlipidemia, unspecified: Secondary | ICD-10-CM

## 2015-02-24 NOTE — Telephone Encounter (Signed)
Pt returned call. Pt is currently in [facility] and states that he will be back in [facility] Oct 26-Nov 2 before returning back to [facility]. Scheduled labs for 10/28. Pt verbalized understanding and was in agreement with this plan.

## 2015-02-24 NOTE — Telephone Encounter (Signed)
Attempted to call pt. Dr. Irish Lack would like for pt to have CMET and Lipids checked in November. Will place order for labs and await return call.

## 2015-03-04 ENCOUNTER — Other Ambulatory Visit (INDEPENDENT_AMBULATORY_CARE_PROVIDER_SITE_OTHER): Payer: Medicare Other

## 2015-03-04 ENCOUNTER — Encounter: Payer: Medicare Other | Attending: Physical Medicine and Rehabilitation | Admitting: Registered Nurse

## 2015-03-04 ENCOUNTER — Encounter: Payer: Self-pay | Admitting: Registered Nurse

## 2015-03-04 VITALS — BP 123/75 | HR 86 | Resp 15

## 2015-03-04 DIAGNOSIS — Z79899 Other long term (current) drug therapy: Secondary | ICD-10-CM

## 2015-03-04 DIAGNOSIS — S8411XA Injury of peroneal nerve at lower leg level, right leg, initial encounter: Secondary | ICD-10-CM | POA: Insufficient documentation

## 2015-03-04 DIAGNOSIS — Z5181 Encounter for therapeutic drug level monitoring: Secondary | ICD-10-CM

## 2015-03-04 DIAGNOSIS — S8412XS Injury of peroneal nerve at lower leg level, left leg, sequela: Secondary | ICD-10-CM

## 2015-03-04 DIAGNOSIS — E785 Hyperlipidemia, unspecified: Secondary | ICD-10-CM

## 2015-03-04 DIAGNOSIS — M722 Plantar fascial fibromatosis: Secondary | ICD-10-CM | POA: Insufficient documentation

## 2015-03-04 DIAGNOSIS — M1732 Unilateral post-traumatic osteoarthritis, left knee: Secondary | ICD-10-CM | POA: Diagnosis not present

## 2015-03-04 DIAGNOSIS — M7522 Bicipital tendinitis, left shoulder: Secondary | ICD-10-CM | POA: Diagnosis not present

## 2015-03-04 DIAGNOSIS — G609 Hereditary and idiopathic neuropathy, unspecified: Secondary | ICD-10-CM

## 2015-03-04 DIAGNOSIS — G894 Chronic pain syndrome: Secondary | ICD-10-CM

## 2015-03-04 LAB — COMPREHENSIVE METABOLIC PANEL
ALK PHOS: 44 U/L (ref 40–115)
ALT: 26 U/L (ref 9–46)
AST: 32 U/L (ref 10–35)
Albumin: 4.2 g/dL (ref 3.6–5.1)
BILIRUBIN TOTAL: 1.1 mg/dL (ref 0.2–1.2)
BUN: 9 mg/dL (ref 7–25)
CO2: 22 mmol/L (ref 20–31)
CREATININE: 0.91 mg/dL (ref 0.70–1.33)
Calcium: 9 mg/dL (ref 8.6–10.3)
Chloride: 106 mmol/L (ref 98–110)
GLUCOSE: 110 mg/dL — AB (ref 65–99)
Potassium: 3.8 mmol/L (ref 3.5–5.3)
SODIUM: 139 mmol/L (ref 135–146)
Total Protein: 7.1 g/dL (ref 6.1–8.1)

## 2015-03-04 LAB — LIPID PANEL
Cholesterol: 99 mg/dL — ABNORMAL LOW (ref 125–200)
HDL: 30 mg/dL — ABNORMAL LOW (ref >=40)
LDL Cholesterol: 52 mg/dL (ref <130)
Total CHOL/HDL Ratio: 3.3 Ratio (ref <=5.0)
Triglycerides: 87 mg/dL (ref <150)
VLDL: 17 mg/dL (ref <30)

## 2015-03-04 MED ORDER — OXYCODONE HCL 15 MG PO TABS: 15.0000 mg | ORAL_TABLET | Freq: Three times a day (TID) | ORAL | Status: DC | PRN

## 2015-03-04 MED ORDER — GABAPENTIN 100 MG PO CAPS: 100.0000 mg | ORAL_CAPSULE | Freq: Three times a day (TID) | ORAL | Status: DC

## 2015-03-04 MED ORDER — ALPRAZOLAM 0.5 MG PO TABS: 0.5000 mg | ORAL_TABLET | Freq: Every day | ORAL | Status: DC | PRN
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
## 2015-03-07 ENCOUNTER — Ambulatory Visit (INDEPENDENT_AMBULATORY_CARE_PROVIDER_SITE_OTHER): Payer: Medicare Other | Admitting: Infectious Diseases

## 2015-03-07 ENCOUNTER — Encounter: Payer: Self-pay | Admitting: Infectious Diseases

## 2015-03-07 DIAGNOSIS — M869 Osteomyelitis, unspecified: Secondary | ICD-10-CM | POA: Diagnosis not present

## 2015-03-07 MED ORDER — DOXYCYCLINE MONOHYDRATE 100 MG PO TABS: 100.0000 mg | ORAL_TABLET | Freq: Two times a day (BID) | ORAL | Status: DC

## 2015-03-07 NOTE — Assessment & Plan Note (Signed)
He is doing well. Will set him up for MRI to see the status of his wound.  He will be out of town til end of month, will set up when he returns. Will refill his doxy.  Will see him after his MRI.
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
## 2015-03-28 ENCOUNTER — Other Ambulatory Visit: Payer: Self-pay

## 2015-03-28 NOTE — Telephone Encounter (Signed)
Pt called requesting a 90 day refill on his Lyrica 150 mg. How many refills would you like for him to have? Please advise.

## 2015-03-28 NOTE — Telephone Encounter (Signed)
He may have 9 months RF

## 2015-03-29 MED ORDER — PREGABALIN 150 MG PO CAPS
150.0000 mg | ORAL_CAPSULE | Freq: Three times a day (TID) | ORAL | Status: DC
Start: 2015-03-29 — End: 2016-01-11

## 2015-03-29 NOTE — Telephone Encounter (Signed)
Lyrica Rx printed [facility]

## 2015-04-01 ENCOUNTER — Other Ambulatory Visit: Payer: Self-pay | Admitting: Infectious Diseases

## 2015-04-01 DIAGNOSIS — M86661 Other chronic osteomyelitis, right tibia and fibula: Secondary | ICD-10-CM

## 2015-04-06 ENCOUNTER — Encounter: Payer: Medicare Other | Attending: Physical Medicine and Rehabilitation | Admitting: Physical Medicine & Rehabilitation

## 2015-04-06 ENCOUNTER — Other Ambulatory Visit: Payer: Self-pay | Admitting: *Deleted

## 2015-04-06 ENCOUNTER — Encounter: Payer: Self-pay | Admitting: Physical Medicine & Rehabilitation

## 2015-04-06 VITALS — BP 144/84 | HR 85

## 2015-04-06 DIAGNOSIS — S8412XS Injury of peroneal nerve at lower leg level, left leg, sequela: Secondary | ICD-10-CM

## 2015-04-06 DIAGNOSIS — S346XXA Injury of peripheral nerve(s) at abdomen, lower back and pelvis level, initial encounter: Secondary | ICD-10-CM | POA: Diagnosis not present

## 2015-04-06 DIAGNOSIS — S8490XA Injury of unspecified nerve at lower leg level, unspecified leg, initial encounter: Secondary | ICD-10-CM | POA: Diagnosis not present

## 2015-04-06 DIAGNOSIS — M1732 Unilateral post-traumatic osteoarthritis, left knee: Secondary | ICD-10-CM | POA: Diagnosis not present

## 2015-04-06 DIAGNOSIS — M7522 Bicipital tendinitis, left shoulder: Secondary | ICD-10-CM | POA: Diagnosis not present

## 2015-04-06 DIAGNOSIS — M722 Plantar fascial fibromatosis: Secondary | ICD-10-CM | POA: Diagnosis not present

## 2015-04-06 DIAGNOSIS — S8411XA Injury of peroneal nerve at lower leg level, right leg, initial encounter: Secondary | ICD-10-CM | POA: Diagnosis not present

## 2015-04-06 MED ORDER — OXYCODONE HCL 15 MG PO TABS: 15.0000 mg | ORAL_TABLET | Freq: Three times a day (TID) | ORAL | Status: DC | PRN

## 2015-04-06 MED ORDER — LIDOCAINE 5 % EX OINT: 1.0000 "application " | TOPICAL_OINTMENT | Freq: Three times a day (TID) | CUTANEOUS | Status: DC | PRN

## 2015-04-06 NOTE — Telephone Encounter (Signed)
Pt was just seen this morning by Dr. Naaman Plummer, was handed a script for oxycodone. Pt went to [place] and was told that insurance would not pay. There is an issue with pt's insurance company recognizing Dr. Charm Barges DEA number.  Spoke with Johnette, she instructed to have [place] reprint script with Zella Ball being the prescribing doctor. Will call patients insurance company to try and straighten out
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
## 2015-04-06 NOTE — Addendum Note (Signed)
Addended by: Geryl Rankins D on: 04/06/2015 10:21 AM   Modules accepted: Orders

## 2015-04-06 NOTE — Patient Instructions (Signed)
PLEASE CALL ME WITH ANY PROBLEMS OR QUESTIONS (#[phone]). HAVE A HAPPY HOLIDAY SEASON!!!    

## 2015-04-08 ENCOUNTER — Other Ambulatory Visit: Payer: Self-pay

## 2015-04-08 DIAGNOSIS — S8412XS Injury of peroneal nerve at lower leg level, left leg, sequela: Secondary | ICD-10-CM

## 2015-04-08 MED ORDER — OXYCODONE HCL 15 MG PO TABS: 15.0000 mg | ORAL_TABLET | Freq: Three times a day (TID) | ORAL | Status: DC | PRN

## 2015-04-11 ENCOUNTER — Ambulatory Visit (HOSPITAL_COMMUNITY): Admission: RE | Admit: 2015-04-11 | Payer: Medicare Other | Source: Ambulatory Visit

## 2015-04-12 DIAGNOSIS — Z23 Encounter for immunization: Secondary | ICD-10-CM | POA: Diagnosis not present

## 2015-04-12 DIAGNOSIS — Z1211 Encounter for screening for malignant neoplasm of colon: Secondary | ICD-10-CM | POA: Diagnosis not present

## 2015-04-12 DIAGNOSIS — J45909 Unspecified asthma, uncomplicated: Secondary | ICD-10-CM | POA: Diagnosis not present

## 2015-04-12 NOTE — Telephone Encounter (Signed)
Victor [name] came into office today to return the prescription he thought was lost. He returned the prescription that was written on 04/06/2015 Oxycodone 15 mg one tablet every 8 hours as needed for pain. #90.

## 2015-04-13 ENCOUNTER — Ambulatory Visit (HOSPITAL_COMMUNITY)
Admission: RE | Admit: 2015-04-13 | Discharge: 2015-04-13 | Disposition: A | Discharge status: Home / Self Care | Payer: Medicare Other | Source: Ambulatory Visit | Attending: Infectious Diseases | Admitting: Infectious Diseases

## 2015-04-13 DIAGNOSIS — R200 Anesthesia of skin: Secondary | ICD-10-CM | POA: Diagnosis not present

## 2015-04-13 DIAGNOSIS — R600 Localized edema: Secondary | ICD-10-CM | POA: Diagnosis not present

## 2015-04-13 DIAGNOSIS — M21962 Unspecified acquired deformity of left lower leg: Secondary | ICD-10-CM | POA: Insufficient documentation

## 2015-04-13 DIAGNOSIS — M869 Osteomyelitis, unspecified: Secondary | ICD-10-CM | POA: Insufficient documentation

## 2015-04-13 MED ORDER — GADOBENATE DIMEGLUMINE 529 MG/ML IV SOLN
20.0000 mL | Freq: Once | INTRAVENOUS | Status: AC | PRN
  Administered 2015-04-13: 20 mL via INTRAVENOUS

## 2015-04-25 ENCOUNTER — Telehealth: Payer: Self-pay | Admitting: *Deleted
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
## 2015-04-25 NOTE — Telephone Encounter (Signed)
Patient called and wants to know the results of he MRI done 04/13/15 and the next steps. Advised the patient will have to speak with the doctor and give him a call back.

## 2015-04-27 NOTE — Telephone Encounter (Signed)
Patient informed of Dr Johnnye Sima response and asked that I froward his results to his PCP Dr Barrie Folk and Dr Christophe Louis. Advised will do.  Dr Barrie Folk fax [phone]  Dr Gladstone Lighter fax [phone]

## 2015-04-29 DIAGNOSIS — J45909 Unspecified asthma, uncomplicated: Secondary | ICD-10-CM | POA: Diagnosis not present

## 2015-04-29 DIAGNOSIS — Z1211 Encounter for screening for malignant neoplasm of colon: Secondary | ICD-10-CM | POA: Diagnosis not present

## 2015-04-29 DIAGNOSIS — I429 Cardiomyopathy, unspecified: Secondary | ICD-10-CM | POA: Diagnosis not present

## 2015-05-06 ENCOUNTER — Encounter: Payer: Self-pay | Admitting: Registered Nurse

## 2015-05-06 ENCOUNTER — Encounter: Payer: Medicare Other | Attending: Physical Medicine and Rehabilitation | Admitting: Registered Nurse

## 2015-05-06 VITALS — BP 135/85 | HR 78

## 2015-05-06 DIAGNOSIS — S8490XA Injury of unspecified nerve at lower leg level, unspecified leg, initial encounter: Secondary | ICD-10-CM

## 2015-05-06 DIAGNOSIS — Z5181 Encounter for therapeutic drug level monitoring: Secondary | ICD-10-CM

## 2015-05-06 DIAGNOSIS — S8411XA Injury of peroneal nerve at lower leg level, right leg, initial encounter: Secondary | ICD-10-CM | POA: Diagnosis not present

## 2015-05-06 DIAGNOSIS — M1732 Unilateral post-traumatic osteoarthritis, left knee: Secondary | ICD-10-CM | POA: Diagnosis not present

## 2015-05-06 DIAGNOSIS — S346XXA Injury of peripheral nerve(s) at abdomen, lower back and pelvis level, initial encounter: Secondary | ICD-10-CM

## 2015-05-06 DIAGNOSIS — M722 Plantar fascial fibromatosis: Secondary | ICD-10-CM | POA: Insufficient documentation

## 2015-05-06 DIAGNOSIS — S8412XS Injury of peroneal nerve at lower leg level, left leg, sequela: Secondary | ICD-10-CM | POA: Diagnosis not present

## 2015-05-06 DIAGNOSIS — Z79899 Other long term (current) drug therapy: Secondary | ICD-10-CM

## 2015-05-06 DIAGNOSIS — M7522 Bicipital tendinitis, left shoulder: Secondary | ICD-10-CM | POA: Insufficient documentation

## 2015-05-06 DIAGNOSIS — G894 Chronic pain syndrome: Secondary | ICD-10-CM | POA: Diagnosis not present

## 2015-05-06 MED ORDER — OXYCODONE HCL 15 MG PO TABS: 15.0000 mg | ORAL_TABLET | Freq: Three times a day (TID) | ORAL | Status: DC | PRN
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
## 2015-05-30 ENCOUNTER — Other Ambulatory Visit: Payer: Self-pay | Admitting: Interventional Cardiology

## 2015-06-06 ENCOUNTER — Encounter: Payer: Medicare Other | Admitting: Registered Nurse

## 2015-06-06 DIAGNOSIS — H52203 Unspecified astigmatism, bilateral: Secondary | ICD-10-CM | POA: Diagnosis not present

## 2015-06-06 DIAGNOSIS — H2512 Age-related nuclear cataract, left eye: Secondary | ICD-10-CM | POA: Diagnosis not present

## 2015-06-06 DIAGNOSIS — H4041X3 Glaucoma secondary to eye inflammation, right eye, severe stage: Secondary | ICD-10-CM | POA: Diagnosis not present

## 2015-06-06 DIAGNOSIS — H2011 Chronic iridocyclitis, right eye: Secondary | ICD-10-CM | POA: Diagnosis not present

## 2015-06-07 DIAGNOSIS — K621 Rectal polyp: Secondary | ICD-10-CM | POA: Diagnosis not present

## 2015-06-07 DIAGNOSIS — D123 Benign neoplasm of transverse colon: Secondary | ICD-10-CM | POA: Diagnosis not present

## 2015-06-07 DIAGNOSIS — Z1211 Encounter for screening for malignant neoplasm of colon: Secondary | ICD-10-CM | POA: Diagnosis not present

## 2015-06-08 ENCOUNTER — Encounter: Payer: Self-pay | Admitting: Registered Nurse

## 2015-06-08 ENCOUNTER — Encounter: Payer: Medicare Other | Attending: Physical Medicine and Rehabilitation | Admitting: Registered Nurse

## 2015-06-08 VITALS — BP 130/83 | HR 63 | Resp 14

## 2015-06-08 DIAGNOSIS — S8412XS Injury of peroneal nerve at lower leg level, left leg, sequela: Secondary | ICD-10-CM | POA: Diagnosis not present

## 2015-06-08 DIAGNOSIS — S346XXA Injury of peripheral nerve(s) at abdomen, lower back and pelvis level, initial encounter: Secondary | ICD-10-CM

## 2015-06-08 DIAGNOSIS — Z5181 Encounter for therapeutic drug level monitoring: Secondary | ICD-10-CM

## 2015-06-08 DIAGNOSIS — M7522 Bicipital tendinitis, left shoulder: Secondary | ICD-10-CM | POA: Insufficient documentation

## 2015-06-08 DIAGNOSIS — S8490XA Injury of unspecified nerve at lower leg level, unspecified leg, initial encounter: Secondary | ICD-10-CM

## 2015-06-08 DIAGNOSIS — S8411XA Injury of peroneal nerve at lower leg level, right leg, initial encounter: Secondary | ICD-10-CM | POA: Diagnosis not present

## 2015-06-08 DIAGNOSIS — M1732 Unilateral post-traumatic osteoarthritis, left knee: Secondary | ICD-10-CM | POA: Insufficient documentation

## 2015-06-08 DIAGNOSIS — Z79899 Other long term (current) drug therapy: Secondary | ICD-10-CM

## 2015-06-08 DIAGNOSIS — G894 Chronic pain syndrome: Secondary | ICD-10-CM

## 2015-06-08 DIAGNOSIS — M722 Plantar fascial fibromatosis: Secondary | ICD-10-CM | POA: Diagnosis not present

## 2015-06-08 MED ORDER — OXYCODONE HCL 15 MG PO TABS
15.0000 mg | ORAL_TABLET | Freq: Three times a day (TID) | ORAL | Status: DC | PRN
Start: 2015-06-08 — End: 2015-08-05

## 2015-06-08 MED FILL — oxyCODONE HCL 15 MG TABS: 15 | 30 days supply | Qty: 90 | Fill #0
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
## 2015-06-08 NOTE — Addendum Note (Signed)
Addended by: Gara Kroner L on: 06/08/2015 09:27 AM   Modules accepted: Orders

## 2015-06-13 LAB — TOXASSURE SELECT,+ANTIDEPR,UR: PDF: 0
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
## 2015-06-16 ENCOUNTER — Telehealth: Payer: Self-pay | Admitting: Registered Nurse

## 2015-06-16 NOTE — Telephone Encounter (Signed)
I spoke with Victor [name] regarding his positive ETOH level. He admits he had a glass of champagne the night before the UDS. He is aware this is his final warning, if he has another positive ETOH level he will be discharge from the office. He verbalizes understanding.

## 2015-06-23 ENCOUNTER — Telehealth: Payer: Self-pay | Admitting: *Deleted

## 2015-06-23 NOTE — Telephone Encounter (Signed)
Pt called asking for [place].  He says he is moving out of state and would like help to get a referral for a new physiatrist in the ste he is moving to (not specified).  Asking for Zella Ball to please call him

## 2015-06-23 NOTE — Telephone Encounter (Signed)
Spoke to Mr. Lolli, He has been carrying for his mother in [place]. He states his mom isn't doing well and his sister was suppose to come to [place] to relieve him from  caring for their mother. His sister is unable to leave her job at this time. He states she is a Tax adviser.  He's requesting Physiatrist in [place], he asked for a referral. I explain he needs to ask his PCP for the referral verbalizes understanding. Will try to obtain a listing and mail it to his mother home. [place]

## 2015-06-23 NOTE — Telephone Encounter (Signed)
Pt has been in contact with Colin Ina, MD in [place], [place]. Pt has requested referral from PCP. Address for this office is [address], [place], [address]. Phone number is [phone].

## 2015-06-25 ENCOUNTER — Other Ambulatory Visit: Payer: Self-pay | Admitting: Registered Nurse

## 2015-06-27 NOTE — Telephone Encounter (Signed)
Pt has recently moved to [facility] and is looking into a new physiatrist. Please advise on refill?

## 2015-07-06 ENCOUNTER — Encounter: Payer: Medicare Other | Admitting: Registered Nurse

## 2015-07-13 ENCOUNTER — Other Ambulatory Visit: Payer: Self-pay | Admitting: Registered Nurse

## 2015-07-14 NOTE — Telephone Encounter (Signed)
Called in xanax refill to pharmacy that was ok'd by Dr Naaman Plummer 06/27/15 but was on print and did not get phoned in.

## 2015-07-25 ENCOUNTER — Telehealth: Payer: Self-pay

## 2015-07-25 NOTE — Telephone Encounter (Signed)
Pt called for refills on his alprazolam. Rx was filled on 07/14/15 by Sybil and given 2 refills. Pt states that he is moving back to [place] in May. He did not establish care with a new physiatrist or PCP. He moved temporarily to take care of his mother.

## 2015-07-26 ENCOUNTER — Telehealth: Payer: Self-pay | Admitting: *Deleted

## 2015-07-26 NOTE — Telephone Encounter (Signed)
Pharmacy is not able to fill alprazolam because of insurance rejection Dr Naaman Plummer DEA.  I have refilled with [facility] and Johnette will address the [place] issue.  Mr Dechert notified.

## 2015-08-05 ENCOUNTER — Encounter: Payer: Self-pay | Admitting: Registered Nurse

## 2015-08-05 ENCOUNTER — Ambulatory Visit: Payer: Medicare Other | Admitting: Registered Nurse

## 2015-08-05 ENCOUNTER — Encounter: Payer: Medicare Other | Attending: Physical Medicine and Rehabilitation | Admitting: Registered Nurse

## 2015-08-05 VITALS — BP 119/79 | HR 91 | Resp 16

## 2015-08-05 DIAGNOSIS — S8412XS Injury of peroneal nerve at lower leg level, left leg, sequela: Secondary | ICD-10-CM | POA: Diagnosis not present

## 2015-08-05 DIAGNOSIS — Z79899 Other long term (current) drug therapy: Secondary | ICD-10-CM

## 2015-08-05 DIAGNOSIS — S8411XA Injury of peroneal nerve at lower leg level, right leg, initial encounter: Secondary | ICD-10-CM | POA: Insufficient documentation

## 2015-08-05 DIAGNOSIS — G894 Chronic pain syndrome: Secondary | ICD-10-CM

## 2015-08-05 DIAGNOSIS — M1732 Unilateral post-traumatic osteoarthritis, left knee: Secondary | ICD-10-CM | POA: Insufficient documentation

## 2015-08-05 DIAGNOSIS — S346XXA Injury of peripheral nerve(s) at abdomen, lower back and pelvis level, initial encounter: Secondary | ICD-10-CM

## 2015-08-05 DIAGNOSIS — Z5181 Encounter for therapeutic drug level monitoring: Secondary | ICD-10-CM

## 2015-08-05 DIAGNOSIS — M722 Plantar fascial fibromatosis: Secondary | ICD-10-CM | POA: Diagnosis not present

## 2015-08-05 DIAGNOSIS — S8490XA Injury of unspecified nerve at lower leg level, unspecified leg, initial encounter: Secondary | ICD-10-CM

## 2015-08-05 DIAGNOSIS — M7522 Bicipital tendinitis, left shoulder: Secondary | ICD-10-CM | POA: Diagnosis not present

## 2015-08-05 MED ORDER — OXYCODONE HCL 15 MG PO TABS
15.0000 mg | ORAL_TABLET | Freq: Three times a day (TID) | ORAL | Status: DC | PRN
Start: 2015-08-05 — End: 2015-09-02

## 2015-08-05 MED FILL — oxyCODONE HCL 15 MG TABS: 15 | 30 days supply | Qty: 90 | Fill #0
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
## 2015-09-02 ENCOUNTER — Encounter: Payer: Self-pay | Admitting: Registered Nurse

## 2015-09-02 ENCOUNTER — Encounter: Payer: Medicare Other | Attending: Physical Medicine and Rehabilitation | Admitting: Registered Nurse

## 2015-09-02 VITALS — BP 137/76 | HR 72 | Resp 14

## 2015-09-02 DIAGNOSIS — S346XXA Injury of peripheral nerve(s) at abdomen, lower back and pelvis level, initial encounter: Secondary | ICD-10-CM | POA: Diagnosis not present

## 2015-09-02 DIAGNOSIS — M722 Plantar fascial fibromatosis: Secondary | ICD-10-CM | POA: Diagnosis not present

## 2015-09-02 DIAGNOSIS — S8412XS Injury of peroneal nerve at lower leg level, left leg, sequela: Secondary | ICD-10-CM | POA: Diagnosis not present

## 2015-09-02 DIAGNOSIS — G894 Chronic pain syndrome: Secondary | ICD-10-CM

## 2015-09-02 DIAGNOSIS — S8411XA Injury of peroneal nerve at lower leg level, right leg, initial encounter: Secondary | ICD-10-CM | POA: Diagnosis not present

## 2015-09-02 DIAGNOSIS — M7522 Bicipital tendinitis, left shoulder: Secondary | ICD-10-CM | POA: Diagnosis not present

## 2015-09-02 DIAGNOSIS — Z79899 Other long term (current) drug therapy: Secondary | ICD-10-CM

## 2015-09-02 DIAGNOSIS — Z5181 Encounter for therapeutic drug level monitoring: Secondary | ICD-10-CM

## 2015-09-02 DIAGNOSIS — S8490XA Injury of unspecified nerve at lower leg level, unspecified leg, initial encounter: Secondary | ICD-10-CM

## 2015-09-02 DIAGNOSIS — M1732 Unilateral post-traumatic osteoarthritis, left knee: Secondary | ICD-10-CM | POA: Insufficient documentation

## 2015-09-02 MED ORDER — OXYCODONE HCL 15 MG PO TABS: 15.0000 mg | ORAL_TABLET | Freq: Three times a day (TID) | ORAL | Status: DC | PRN

## 2015-09-02 MED FILL — oxyCODONE HCL 15 MG TABS: 15 | 30 days supply | Qty: 90 | Fill #0
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
## 2015-10-04 ENCOUNTER — Encounter: Payer: Self-pay | Admitting: Registered Nurse

## 2015-10-04 ENCOUNTER — Encounter: Payer: Medicare Other | Attending: Physical Medicine and Rehabilitation | Admitting: Registered Nurse

## 2015-10-04 VITALS — BP 132/87 | HR 67 | Resp 14

## 2015-10-04 DIAGNOSIS — M7522 Bicipital tendinitis, left shoulder: Secondary | ICD-10-CM | POA: Diagnosis not present

## 2015-10-04 DIAGNOSIS — M1732 Unilateral post-traumatic osteoarthritis, left knee: Secondary | ICD-10-CM | POA: Diagnosis not present

## 2015-10-04 DIAGNOSIS — F411 Generalized anxiety disorder: Secondary | ICD-10-CM

## 2015-10-04 DIAGNOSIS — Z79899 Other long term (current) drug therapy: Secondary | ICD-10-CM

## 2015-10-04 DIAGNOSIS — S8411XA Injury of peroneal nerve at lower leg level, right leg, initial encounter: Secondary | ICD-10-CM | POA: Insufficient documentation

## 2015-10-04 DIAGNOSIS — M722 Plantar fascial fibromatosis: Secondary | ICD-10-CM | POA: Insufficient documentation

## 2015-10-04 DIAGNOSIS — S346XXA Injury of peripheral nerve(s) at abdomen, lower back and pelvis level, initial encounter: Secondary | ICD-10-CM | POA: Diagnosis not present

## 2015-10-04 DIAGNOSIS — Z5181 Encounter for therapeutic drug level monitoring: Secondary | ICD-10-CM

## 2015-10-04 DIAGNOSIS — G894 Chronic pain syndrome: Secondary | ICD-10-CM

## 2015-10-04 DIAGNOSIS — S8412XS Injury of peroneal nerve at lower leg level, left leg, sequela: Secondary | ICD-10-CM

## 2015-10-04 DIAGNOSIS — S8490XA Injury of unspecified nerve at lower leg level, unspecified leg, initial encounter: Secondary | ICD-10-CM

## 2015-10-04 MED ORDER — OXYCODONE HCL 15 MG PO TABS: 15.0000 mg | ORAL_TABLET | Freq: Three times a day (TID) | ORAL | Status: DC | PRN

## 2015-10-04 MED ORDER — ALPRAZOLAM 0.5 MG PO TABS: ORAL_TABLET | ORAL | Status: DC

## 2015-10-04 MED FILL — oxyCODONE HCL 15 MG TABS: 15 | 30 days supply | Qty: 90 | Fill #0
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
## 2015-11-04 ENCOUNTER — Encounter: Payer: Medicare Other | Attending: Physical Medicine and Rehabilitation | Admitting: Registered Nurse

## 2015-11-04 ENCOUNTER — Encounter: Payer: Self-pay | Admitting: Registered Nurse

## 2015-11-04 VITALS — BP 124/80 | HR 75

## 2015-11-04 DIAGNOSIS — S8490XA Injury of unspecified nerve at lower leg level, unspecified leg, initial encounter: Secondary | ICD-10-CM

## 2015-11-04 DIAGNOSIS — M1732 Unilateral post-traumatic osteoarthritis, left knee: Secondary | ICD-10-CM | POA: Diagnosis not present

## 2015-11-04 DIAGNOSIS — Z79899 Other long term (current) drug therapy: Secondary | ICD-10-CM

## 2015-11-04 DIAGNOSIS — M722 Plantar fascial fibromatosis: Secondary | ICD-10-CM | POA: Diagnosis not present

## 2015-11-04 DIAGNOSIS — S8411XA Injury of peroneal nerve at lower leg level, right leg, initial encounter: Secondary | ICD-10-CM | POA: Diagnosis not present

## 2015-11-04 DIAGNOSIS — M7522 Bicipital tendinitis, left shoulder: Secondary | ICD-10-CM | POA: Insufficient documentation

## 2015-11-04 DIAGNOSIS — Z5181 Encounter for therapeutic drug level monitoring: Secondary | ICD-10-CM | POA: Diagnosis not present

## 2015-11-04 DIAGNOSIS — S8412XS Injury of peroneal nerve at lower leg level, left leg, sequela: Secondary | ICD-10-CM | POA: Diagnosis not present

## 2015-11-04 DIAGNOSIS — G894 Chronic pain syndrome: Secondary | ICD-10-CM | POA: Diagnosis not present

## 2015-11-04 DIAGNOSIS — F411 Generalized anxiety disorder: Secondary | ICD-10-CM

## 2015-11-04 DIAGNOSIS — S346XXA Injury of peripheral nerve(s) at abdomen, lower back and pelvis level, initial encounter: Secondary | ICD-10-CM | POA: Diagnosis not present

## 2015-11-04 MED ORDER — OXYCODONE HCL 15 MG PO TABS
15.0000 mg | ORAL_TABLET | Freq: Three times a day (TID) | ORAL | Status: DC | PRN
Start: 2015-11-04 — End: 2015-12-05

## 2015-11-04 NOTE — Addendum Note (Signed)
Addended by: Geryl Rankins D on: 11/04/2015 09:17 AM   Modules accepted: Orders
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
## 2015-11-11 ENCOUNTER — Other Ambulatory Visit: Payer: Self-pay | Admitting: Interventional Cardiology

## 2015-11-13 LAB — TOXASSURE SELECT,+ANTIDEPR,UR: PDF: 0

## 2015-11-15 NOTE — Progress Notes (Signed)
Urine drug screen for this encounter is consistent for prescribed medications.   

## 2015-11-28 ENCOUNTER — Telehealth: Payer: Self-pay | Admitting: Physical Medicine & Rehabilitation

## 2015-11-28 MED ORDER — ALPRAZOLAM 0.5 MG PO TABS: 0.5000 mg | ORAL_TABLET | Freq: Every day | ORAL | 2 refills | Status: DC | PRN

## 2015-11-28 NOTE — Telephone Encounter (Signed)
Please advise on refill.

## 2015-11-28 NOTE — Telephone Encounter (Signed)
On patient's last visit, he has misplaced his prescription for Xanax.  He would like to know if this could be refilled.  Please call patient when this is done.

## 2015-11-28 NOTE — Telephone Encounter (Signed)
Spoke with Victor [name], he lost his Xanax prescription. According to [facility] last prescription was picked up on 11/04/15. Xanax has been called in, Victor [name] is aware and verbalizes understanding.

## 2015-11-30 ENCOUNTER — Ambulatory Visit: Payer: Medicare Other | Admitting: Physical Medicine & Rehabilitation

## 2015-12-05 ENCOUNTER — Telehealth: Payer: Self-pay | Admitting: Registered Nurse

## 2015-12-05 ENCOUNTER — Encounter: Payer: Self-pay | Admitting: Physical Medicine & Rehabilitation

## 2015-12-05 ENCOUNTER — Encounter: Payer: Medicare Other | Attending: Physical Medicine and Rehabilitation | Admitting: Physical Medicine & Rehabilitation

## 2015-12-05 VITALS — BP 132/83 | HR 65

## 2015-12-05 DIAGNOSIS — S8412XS Injury of peroneal nerve at lower leg level, left leg, sequela: Secondary | ICD-10-CM

## 2015-12-05 DIAGNOSIS — M722 Plantar fascial fibromatosis: Secondary | ICD-10-CM | POA: Diagnosis not present

## 2015-12-05 DIAGNOSIS — M7522 Bicipital tendinitis, left shoulder: Secondary | ICD-10-CM | POA: Diagnosis not present

## 2015-12-05 DIAGNOSIS — S8411XA Injury of peroneal nerve at lower leg level, right leg, initial encounter: Secondary | ICD-10-CM | POA: Diagnosis not present

## 2015-12-05 DIAGNOSIS — M1732 Unilateral post-traumatic osteoarthritis, left knee: Secondary | ICD-10-CM

## 2015-12-05 DIAGNOSIS — M7582 Other shoulder lesions, left shoulder: Secondary | ICD-10-CM

## 2015-12-05 MED ORDER — OXYCODONE HCL 15 MG PO TABS: 15.0000 mg | ORAL_TABLET | Freq: Three times a day (TID) | ORAL | 0 refills | Status: DC | PRN

## 2015-12-05 MED FILL — oxyCODONE HCL 15 MG TABS: 15 | 30 days supply | Qty: 90 | Fill #0

## 2015-12-05 NOTE — Patient Instructions (Signed)
EASE UP ON YOUR WEIGHT LIFTING---TRY MORE REPS/LESS WEIGHTS
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
## 2015-12-05 NOTE — Telephone Encounter (Signed)
Mr. Kless seen Dr. Naaman Plummer today, he went to get his Oxycodone filled, the pharmacist at [facility] said it was a problem with the [place] number. Scripts re-printed. Scripts will be discarded.

## 2015-12-06 DIAGNOSIS — H4041X3 Glaucoma secondary to eye inflammation, right eye, severe stage: Secondary | ICD-10-CM | POA: Diagnosis not present

## 2015-12-07 DIAGNOSIS — Z125 Encounter for screening for malignant neoplasm of prostate: Secondary | ICD-10-CM | POA: Diagnosis not present

## 2015-12-07 DIAGNOSIS — I119 Hypertensive heart disease without heart failure: Secondary | ICD-10-CM | POA: Diagnosis not present

## 2015-12-07 DIAGNOSIS — I429 Cardiomyopathy, unspecified: Secondary | ICD-10-CM | POA: Diagnosis not present

## 2015-12-07 DIAGNOSIS — E7800 Pure hypercholesterolemia, unspecified: Secondary | ICD-10-CM | POA: Diagnosis not present

## 2015-12-07 DIAGNOSIS — Z6838 Body mass index (BMI) 38.0-38.9, adult: Secondary | ICD-10-CM | POA: Diagnosis not present

## 2015-12-07 DIAGNOSIS — J45909 Unspecified asthma, uncomplicated: Secondary | ICD-10-CM | POA: Diagnosis not present

## 2015-12-12 IMAGING — CR DG CHEST 2V
2 series · 2 of 2 positions shown · non-contrast
Comparison: July 22, 2013

CLINICAL DATA: Preoperative for left shoulder surgery. History of
hypertension and asthma.

EXAM:
CHEST  2 VIEW

[w chest pa]
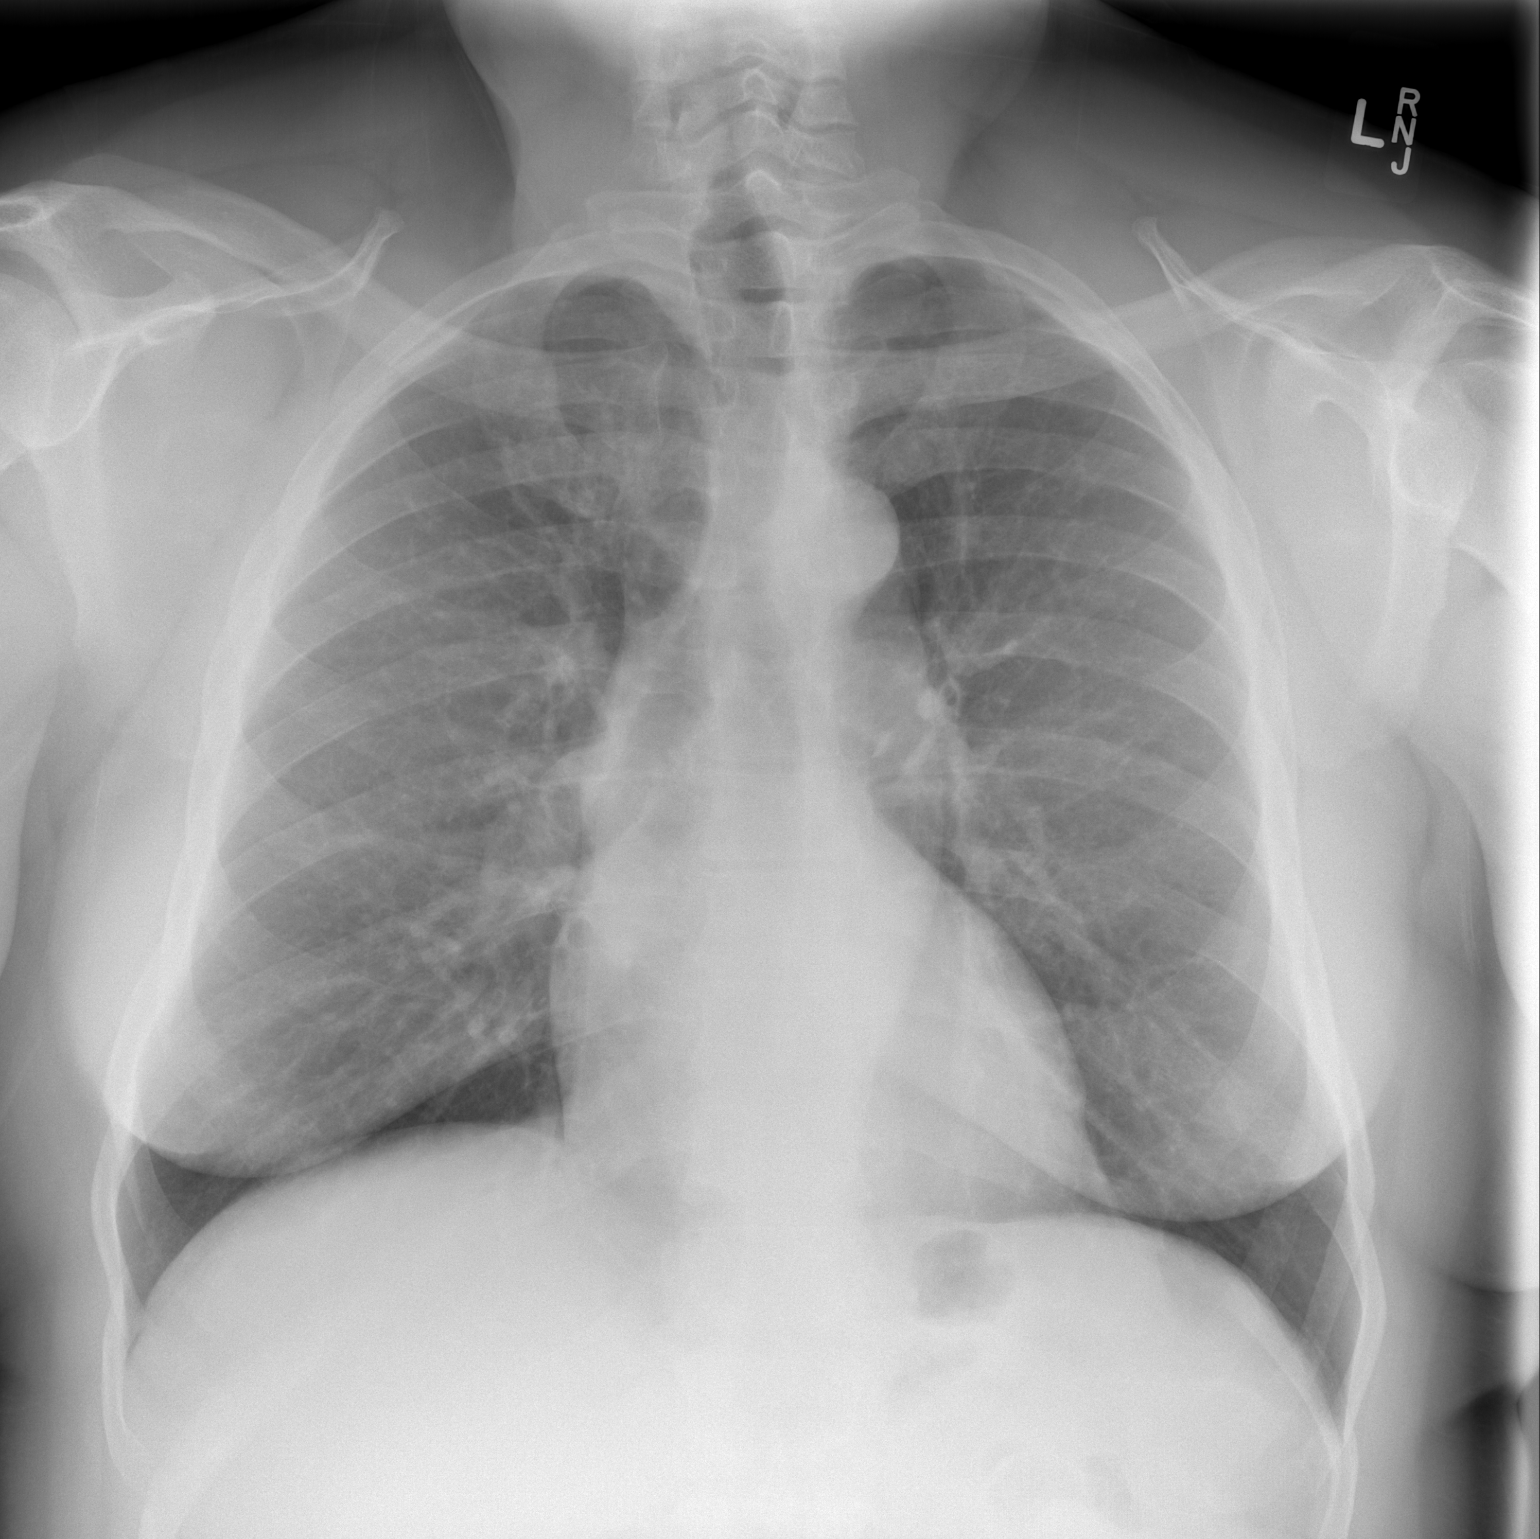

[w chest lat]
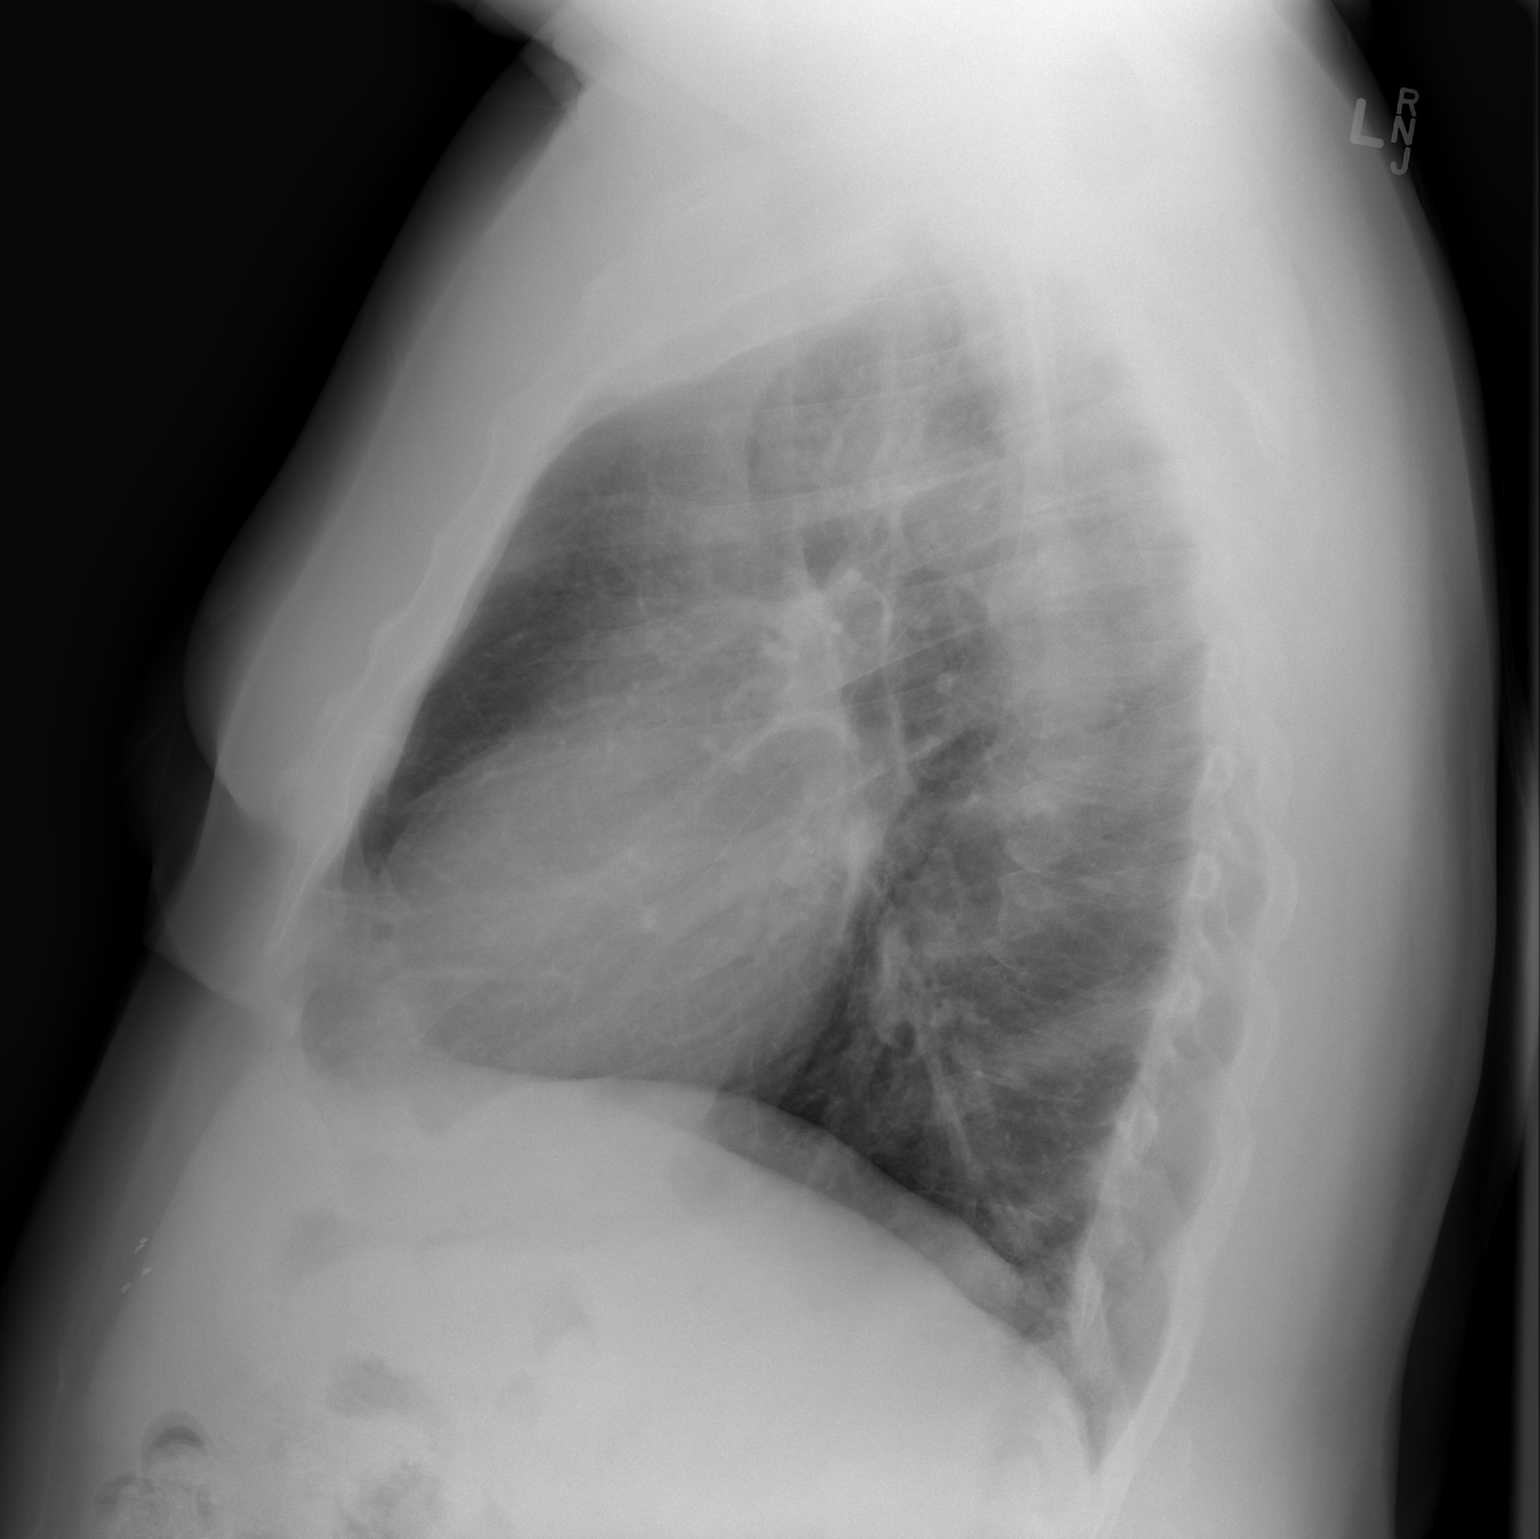

[2 of 2 positions shown; findings below may reference images not displayed]

FINDINGS: The heart size and mediastinal contours are within normal limits.
The aorta is tortuous. There is no focal infiltrate, pulmonary
edema, or pleural effusion. The visualized skeletal structures are
unremarkable.
IMPRESSION: No active cardiopulmonary disease.

## 2015-12-15 DIAGNOSIS — M25511 Pain in right shoulder: Secondary | ICD-10-CM | POA: Diagnosis not present

## 2016-01-03 ENCOUNTER — Ambulatory Visit: Payer: Medicare Other | Admitting: Registered Nurse

## 2016-01-11 ENCOUNTER — Other Ambulatory Visit: Payer: Self-pay

## 2016-01-11 MED ORDER — PREGABALIN 150 MG PO CAPS: 150.0000 mg | ORAL_CAPSULE | Freq: Three times a day (TID) | ORAL | 3 refills | Status: DC

## 2016-01-11 NOTE — Telephone Encounter (Signed)
Pt called requesting a refill on Lyrica. [facility]

## 2016-01-18 ENCOUNTER — Encounter: Payer: Self-pay | Admitting: Cardiology

## 2016-02-01 ENCOUNTER — Ambulatory Visit: Payer: Medicare Other | Admitting: Interventional Cardiology

## 2016-02-02 ENCOUNTER — Encounter: Payer: Self-pay | Admitting: Cardiology

## 2016-02-02 ENCOUNTER — Ambulatory Visit (INDEPENDENT_AMBULATORY_CARE_PROVIDER_SITE_OTHER): Payer: Medicare Other | Admitting: Cardiology

## 2016-02-02 ENCOUNTER — Encounter: Payer: Self-pay | Admitting: Registered Nurse

## 2016-02-02 ENCOUNTER — Encounter: Payer: Medicare Other | Attending: Physical Medicine and Rehabilitation | Admitting: Registered Nurse

## 2016-02-02 VITALS — BP 130/90 | HR 70 | Ht 73.0 in | Wt 283.4 lb

## 2016-02-02 VITALS — BP 125/90 | HR 78

## 2016-02-02 DIAGNOSIS — S8411XA Injury of peroneal nerve at lower leg level, right leg, initial encounter: Secondary | ICD-10-CM | POA: Insufficient documentation

## 2016-02-02 DIAGNOSIS — M1732 Unilateral post-traumatic osteoarthritis, left knee: Secondary | ICD-10-CM | POA: Insufficient documentation

## 2016-02-02 DIAGNOSIS — G894 Chronic pain syndrome: Secondary | ICD-10-CM | POA: Diagnosis not present

## 2016-02-02 DIAGNOSIS — S8412XS Injury of peroneal nerve at lower leg level, left leg, sequela: Secondary | ICD-10-CM

## 2016-02-02 DIAGNOSIS — Z5181 Encounter for therapeutic drug level monitoring: Secondary | ICD-10-CM

## 2016-02-02 DIAGNOSIS — I159 Secondary hypertension, unspecified: Secondary | ICD-10-CM

## 2016-02-02 DIAGNOSIS — M25511 Pain in right shoulder: Secondary | ICD-10-CM | POA: Diagnosis not present

## 2016-02-02 DIAGNOSIS — F411 Generalized anxiety disorder: Secondary | ICD-10-CM | POA: Diagnosis not present

## 2016-02-02 DIAGNOSIS — Z79899 Other long term (current) drug therapy: Secondary | ICD-10-CM

## 2016-02-02 DIAGNOSIS — M722 Plantar fascial fibromatosis: Secondary | ICD-10-CM | POA: Diagnosis not present

## 2016-02-02 DIAGNOSIS — E785 Hyperlipidemia, unspecified: Secondary | ICD-10-CM | POA: Diagnosis not present

## 2016-02-02 DIAGNOSIS — M7522 Bicipital tendinitis, left shoulder: Secondary | ICD-10-CM | POA: Diagnosis not present

## 2016-02-02 MED ORDER — ALPRAZOLAM 0.5 MG PO TABS: 0.5000 mg | ORAL_TABLET | Freq: Every day | ORAL | 2 refills | Status: DC | PRN

## 2016-02-02 MED ORDER — OXYCODONE HCL 15 MG PO TABS: 15.0000 mg | ORAL_TABLET | Freq: Three times a day (TID) | ORAL | 0 refills | Status: DC | PRN
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
## 2016-02-02 NOTE — Patient Instructions (Addendum)
Medication Instructions:   Your physician recommends that you continue on your current medications as directed. Please refer to the Current Medication list given to you today.    If you need a refill on your cardiac medications before your next appointment, please call your pharmacy.  Labwork: RETURN 02/03/16 FOR LIVER AND LIPID   Testing/Procedures: NONE ORDER TODAY    Follow-Up: Your physician wants you to follow-up in: [place] will receive a reminder letter in the mail two months in advance. If you don't receive a letter, please call our office to schedule the follow-up appointment.     Any Other Special Instructions Will Be Listed Below (If Applicable).

## 2016-02-03 ENCOUNTER — Other Ambulatory Visit: Payer: Medicare Other

## 2016-02-03 DIAGNOSIS — H4041X3 Glaucoma secondary to eye inflammation, right eye, severe stage: Secondary | ICD-10-CM | POA: Diagnosis not present

## 2016-02-03 DIAGNOSIS — M75102 Unspecified rotator cuff tear or rupture of left shoulder, not specified as traumatic: Secondary | ICD-10-CM | POA: Diagnosis not present

## 2016-02-09 ENCOUNTER — Other Ambulatory Visit: Payer: Self-pay | Admitting: Interventional Cardiology

## 2016-02-19 ENCOUNTER — Other Ambulatory Visit: Payer: Self-pay | Admitting: Interventional Cardiology

## 2016-04-02 ENCOUNTER — Encounter: Payer: Self-pay | Admitting: Physical Medicine & Rehabilitation

## 2016-04-02 ENCOUNTER — Encounter: Payer: Medicare Other | Attending: Physical Medicine and Rehabilitation | Admitting: Physical Medicine & Rehabilitation

## 2016-04-02 VITALS — BP 132/88 | HR 73 | Resp 14

## 2016-04-02 DIAGNOSIS — Z79899 Other long term (current) drug therapy: Secondary | ICD-10-CM

## 2016-04-02 DIAGNOSIS — M7522 Bicipital tendinitis, left shoulder: Secondary | ICD-10-CM | POA: Diagnosis not present

## 2016-04-02 DIAGNOSIS — S8411XA Injury of peroneal nerve at lower leg level, right leg, initial encounter: Secondary | ICD-10-CM | POA: Insufficient documentation

## 2016-04-02 DIAGNOSIS — S8412XS Injury of peroneal nerve at lower leg level, left leg, sequela: Secondary | ICD-10-CM | POA: Diagnosis not present

## 2016-04-02 DIAGNOSIS — Z5181 Encounter for therapeutic drug level monitoring: Secondary | ICD-10-CM

## 2016-04-02 DIAGNOSIS — M722 Plantar fascial fibromatosis: Secondary | ICD-10-CM | POA: Diagnosis not present

## 2016-04-02 DIAGNOSIS — M25511 Pain in right shoulder: Secondary | ICD-10-CM | POA: Diagnosis not present

## 2016-04-02 DIAGNOSIS — M1732 Unilateral post-traumatic osteoarthritis, left knee: Secondary | ICD-10-CM | POA: Diagnosis not present

## 2016-04-02 MED ORDER — OXYCODONE HCL 15 MG PO TABS: 15.0000 mg | ORAL_TABLET | Freq: Three times a day (TID) | ORAL | 0 refills | Status: DC | PRN
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
## 2016-04-02 NOTE — Patient Instructions (Signed)
FOCUS ON LOWER IMPACT AEROBIC EXERCISES   PLEASE CALL ME WITH ANY PROBLEMS OR QUESTIONS [id number])   HAPPY HOLIDAYS!!!!                    *                * *             *   *   *         *  *   *  *  *     *  *  *  *  *  *  * *  *  *  *  *  *  *  *  *  * *               *  *               *  *               *  *

## 2016-04-02 NOTE — Addendum Note (Signed)
Addended by: Geryl Rankins D on: 04/02/2016 02:47 PM   Modules accepted: Orders

## 2016-04-05 LAB — TOXASSURE SELECT,+ANTIDEPR,UR

## 2016-04-17 NOTE — Progress Notes (Signed)
Urine drug screen for this encounter is consistent for prescribed medication 

## 2016-05-29 DIAGNOSIS — H4041X3 Glaucoma secondary to eye inflammation, right eye, severe stage: Secondary | ICD-10-CM | POA: Diagnosis not present

## 2016-05-29 DIAGNOSIS — H52203 Unspecified astigmatism, bilateral: Secondary | ICD-10-CM | POA: Diagnosis not present

## 2016-05-29 DIAGNOSIS — H2512 Age-related nuclear cataract, left eye: Secondary | ICD-10-CM | POA: Diagnosis not present

## 2016-05-29 DIAGNOSIS — H524 Presbyopia: Secondary | ICD-10-CM | POA: Diagnosis not present

## 2016-05-30 ENCOUNTER — Encounter: Payer: Self-pay | Admitting: Registered Nurse

## 2016-05-30 ENCOUNTER — Encounter: Payer: Medicare Other | Attending: Physical Medicine and Rehabilitation | Admitting: Registered Nurse

## 2016-05-30 VITALS — BP 123/84 | HR 77 | Resp 14

## 2016-05-30 DIAGNOSIS — S8411XA Injury of peroneal nerve at lower leg level, right leg, initial encounter: Secondary | ICD-10-CM | POA: Diagnosis not present

## 2016-05-30 DIAGNOSIS — M722 Plantar fascial fibromatosis: Secondary | ICD-10-CM | POA: Insufficient documentation

## 2016-05-30 DIAGNOSIS — M7522 Bicipital tendinitis, left shoulder: Secondary | ICD-10-CM | POA: Diagnosis not present

## 2016-05-30 DIAGNOSIS — S8412XS Injury of peroneal nerve at lower leg level, left leg, sequela: Secondary | ICD-10-CM | POA: Diagnosis not present

## 2016-05-30 DIAGNOSIS — M25511 Pain in right shoulder: Secondary | ICD-10-CM

## 2016-05-30 DIAGNOSIS — G894 Chronic pain syndrome: Secondary | ICD-10-CM

## 2016-05-30 DIAGNOSIS — Z79899 Other long term (current) drug therapy: Secondary | ICD-10-CM

## 2016-05-30 DIAGNOSIS — M1732 Unilateral post-traumatic osteoarthritis, left knee: Secondary | ICD-10-CM

## 2016-05-30 DIAGNOSIS — Z5181 Encounter for therapeutic drug level monitoring: Secondary | ICD-10-CM

## 2016-05-30 MED ORDER — OXYCODONE HCL 15 MG PO TABS: 15.0000 mg | ORAL_TABLET | Freq: Three times a day (TID) | ORAL | 0 refills | Status: DC | PRN

## 2016-05-30 MED ORDER — OXYCODONE HCL 15 MG PO TABS
15.0000 mg | ORAL_TABLET | Freq: Three times a day (TID) | ORAL | 0 refills | Status: DC | PRN
Start: 2016-05-30 — End: 2016-05-30

## 2016-05-30 MED ORDER — ALPRAZOLAM 0.5 MG PO TABS: 0.5000 mg | ORAL_TABLET | Freq: Every day | ORAL | 2 refills | Status: DC | PRN
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
## 2016-05-31 MED FILL — oxyCODONE HCL 15 MG TABS: 15 | 30 days supply | Qty: 90 | Fill #0

## 2016-07-25 ENCOUNTER — Encounter: Payer: Medicare Other | Admitting: Registered Nurse

## 2016-07-26 ENCOUNTER — Encounter: Payer: Medicare Other | Attending: Physical Medicine and Rehabilitation | Admitting: Registered Nurse

## 2016-07-26 ENCOUNTER — Encounter: Payer: Self-pay | Admitting: Registered Nurse

## 2016-07-26 VITALS — BP 123/87 | HR 86

## 2016-07-26 DIAGNOSIS — M722 Plantar fascial fibromatosis: Secondary | ICD-10-CM | POA: Insufficient documentation

## 2016-07-26 DIAGNOSIS — M1732 Unilateral post-traumatic osteoarthritis, left knee: Secondary | ICD-10-CM | POA: Diagnosis not present

## 2016-07-26 DIAGNOSIS — J069 Acute upper respiratory infection, unspecified: Secondary | ICD-10-CM | POA: Diagnosis not present

## 2016-07-26 DIAGNOSIS — Z79899 Other long term (current) drug therapy: Secondary | ICD-10-CM | POA: Diagnosis not present

## 2016-07-26 DIAGNOSIS — Z5181 Encounter for therapeutic drug level monitoring: Secondary | ICD-10-CM | POA: Diagnosis not present

## 2016-07-26 DIAGNOSIS — S8412XS Injury of peroneal nerve at lower leg level, left leg, sequela: Secondary | ICD-10-CM | POA: Diagnosis not present

## 2016-07-26 DIAGNOSIS — G894 Chronic pain syndrome: Secondary | ICD-10-CM

## 2016-07-26 DIAGNOSIS — M7522 Bicipital tendinitis, left shoulder: Secondary | ICD-10-CM | POA: Insufficient documentation

## 2016-07-26 DIAGNOSIS — S8411XA Injury of peroneal nerve at lower leg level, right leg, initial encounter: Secondary | ICD-10-CM | POA: Diagnosis not present

## 2016-07-26 DIAGNOSIS — F411 Generalized anxiety disorder: Secondary | ICD-10-CM

## 2016-07-26 MED ORDER — OXYCODONE HCL 15 MG PO TABS: 15.0000 mg | ORAL_TABLET | Freq: Three times a day (TID) | ORAL | 0 refills | Status: DC | PRN

## 2016-07-26 MED ORDER — ALPRAZOLAM 0.5 MG PO TABS: 0.5000 mg | ORAL_TABLET | Freq: Every day | ORAL | 2 refills | Status: DC | PRN

## 2016-07-26 MED FILL — oxyCODONE HCL 15 MG TABS: 15 | 30 days supply | Qty: 90 | Fill #0
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
## 2016-07-30 ENCOUNTER — Telehealth: Payer: Self-pay | Admitting: Registered Nurse

## 2016-07-30 DIAGNOSIS — I119 Hypertensive heart disease without heart failure: Secondary | ICD-10-CM | POA: Diagnosis not present

## 2016-07-30 DIAGNOSIS — J45909 Unspecified asthma, uncomplicated: Secondary | ICD-10-CM | POA: Diagnosis not present

## 2016-07-30 DIAGNOSIS — G8929 Other chronic pain: Secondary | ICD-10-CM | POA: Diagnosis not present

## 2016-07-30 DIAGNOSIS — Z6839 Body mass index (BMI) 39.0-39.9, adult: Secondary | ICD-10-CM | POA: Diagnosis not present

## 2016-07-30 DIAGNOSIS — Z0000 Encounter for general adult medical examination without abnormal findings: Secondary | ICD-10-CM | POA: Diagnosis not present

## 2016-07-30 DIAGNOSIS — E7800 Pure hypercholesterolemia, unspecified: Secondary | ICD-10-CM | POA: Diagnosis not present

## 2016-07-30 NOTE — Telephone Encounter (Signed)
On 07/30/2016 the [place] was reviewed no conflict was seen on the [place] with multiple prescribers. Victor [name] has a signed narcotic contract with our office. If there were any discrepancies this would have been reported to his physician.

## 2016-07-31 LAB — TOXASSURE SELECT,+ANTIDEPR,UR

## 2016-08-17 ENCOUNTER — Telehealth: Payer: Self-pay | Admitting: Registered Nurse

## 2016-08-17 NOTE — Telephone Encounter (Signed)
[name] had a UDS performed on 07/26/2016, it was consistent.

## 2016-09-02 ENCOUNTER — Other Ambulatory Visit: Payer: Self-pay | Admitting: Infectious Diseases

## 2016-09-02 DIAGNOSIS — M869 Osteomyelitis, unspecified: Secondary | ICD-10-CM

## 2016-09-03 ENCOUNTER — Telehealth: Payer: Self-pay | Admitting: *Deleted

## 2016-09-03 NOTE — Telephone Encounter (Signed)
Patient called and advised he needs refills on his Doxy 100. He has been out for about 3 weeks. He advised he is not sure what will happen if he does not take it. Advised the patient that since he has not been here since 02/2015 I will need to ask the doctor about refills. He advised understands and is open to having a visit if necessary. He advised he is not having any symptoms. No pain, swelling, redness, or fever. And no open wounds. Advised will ask the doctor is appropriate and give him a call back as soon as possible.

## 2016-09-04 NOTE — Telephone Encounter (Signed)
Looks like he needs a visit thanks

## 2016-09-04 NOTE — Telephone Encounter (Signed)
Called the patient and left message for him to call the office to be seen for refills or he can see his PCP as he lives in [place].

## 2016-09-18 ENCOUNTER — Encounter: Payer: Medicare Other | Attending: Physical Medicine and Rehabilitation | Admitting: Physical Medicine & Rehabilitation

## 2016-09-18 ENCOUNTER — Telehealth: Payer: Self-pay | Admitting: Registered Nurse

## 2016-09-18 ENCOUNTER — Encounter: Payer: Self-pay | Admitting: Physical Medicine & Rehabilitation

## 2016-09-18 VITALS — BP 118/81 | HR 82

## 2016-09-18 DIAGNOSIS — S8412XS Injury of peroneal nerve at lower leg level, left leg, sequela: Secondary | ICD-10-CM

## 2016-09-18 DIAGNOSIS — S8411XA Injury of peroneal nerve at lower leg level, right leg, initial encounter: Secondary | ICD-10-CM | POA: Insufficient documentation

## 2016-09-18 DIAGNOSIS — M1732 Unilateral post-traumatic osteoarthritis, left knee: Secondary | ICD-10-CM | POA: Diagnosis not present

## 2016-09-18 DIAGNOSIS — M722 Plantar fascial fibromatosis: Secondary | ICD-10-CM | POA: Insufficient documentation

## 2016-09-18 DIAGNOSIS — M7522 Bicipital tendinitis, left shoulder: Secondary | ICD-10-CM | POA: Diagnosis not present

## 2016-09-18 DIAGNOSIS — N521 Erectile dysfunction due to diseases classified elsewhere: Secondary | ICD-10-CM

## 2016-09-18 MED ORDER — SILDENAFIL CITRATE 50 MG PO TABS: 50.0000 mg | ORAL_TABLET | Freq: Every day | ORAL | 2 refills | Status: DC | PRN

## 2016-09-18 MED ORDER — ALPRAZOLAM 0.5 MG PO TABS: 0.5000 mg | ORAL_TABLET | Freq: Every day | ORAL | 2 refills | Status: DC | PRN

## 2016-09-18 MED ORDER — OXYCODONE HCL 15 MG PO TABS: 15.0000 mg | ORAL_TABLET | Freq: Three times a day (TID) | ORAL | 0 refills | Status: DC | PRN
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
## 2016-09-18 NOTE — Telephone Encounter (Signed)
Prescriptions Printed.

## 2016-09-18 NOTE — Patient Instructions (Signed)
PLEASE FEEL FREE TO CALL OUR OFFICE WITH ANY PROBLEMS OR QUESTIONS ([phone])      

## 2016-09-21 MED FILL — oxyCODONE HCL 15 MG TABS: 15 | 30 days supply | Qty: 90 | Fill #0

## 2016-10-17 DIAGNOSIS — Z6838 Body mass index (BMI) 38.0-38.9, adult: Secondary | ICD-10-CM | POA: Diagnosis not present

## 2016-10-17 DIAGNOSIS — J45909 Unspecified asthma, uncomplicated: Secondary | ICD-10-CM | POA: Diagnosis not present

## 2016-10-17 DIAGNOSIS — E669 Obesity, unspecified: Secondary | ICD-10-CM | POA: Diagnosis not present

## 2016-10-19 DIAGNOSIS — T148XXA Other injury of unspecified body region, initial encounter: Secondary | ICD-10-CM | POA: Diagnosis not present

## 2016-11-19 ENCOUNTER — Ambulatory Visit: Payer: Medicare Other | Admitting: Physical Medicine & Rehabilitation

## 2016-11-20 ENCOUNTER — Telehealth: Payer: Self-pay | Admitting: Registered Nurse

## 2016-11-20 ENCOUNTER — Encounter: Payer: Self-pay | Admitting: Registered Nurse

## 2016-11-20 ENCOUNTER — Encounter: Payer: Medicare Other | Attending: Physical Medicine and Rehabilitation | Admitting: Registered Nurse

## 2016-11-20 ENCOUNTER — Encounter (HOSPITAL_BASED_OUTPATIENT_CLINIC_OR_DEPARTMENT_OTHER): Payer: Medicare Other | Attending: Internal Medicine

## 2016-11-20 VITALS — BP 138/83 | HR 83

## 2016-11-20 DIAGNOSIS — M722 Plantar fascial fibromatosis: Secondary | ICD-10-CM | POA: Diagnosis not present

## 2016-11-20 DIAGNOSIS — G894 Chronic pain syndrome: Secondary | ICD-10-CM | POA: Diagnosis not present

## 2016-11-20 DIAGNOSIS — Z79899 Other long term (current) drug therapy: Secondary | ICD-10-CM | POA: Insufficient documentation

## 2016-11-20 DIAGNOSIS — Z6836 Body mass index (BMI) 36.0-36.9, adult: Secondary | ICD-10-CM | POA: Insufficient documentation

## 2016-11-20 DIAGNOSIS — S8412XS Injury of peroneal nerve at lower leg level, left leg, sequela: Secondary | ICD-10-CM | POA: Diagnosis not present

## 2016-11-20 DIAGNOSIS — E669 Obesity, unspecified: Secondary | ICD-10-CM | POA: Insufficient documentation

## 2016-11-20 DIAGNOSIS — Z7951 Long term (current) use of inhaled steroids: Secondary | ICD-10-CM | POA: Insufficient documentation

## 2016-11-20 DIAGNOSIS — S8411XA Injury of peroneal nerve at lower leg level, right leg, initial encounter: Secondary | ICD-10-CM | POA: Diagnosis not present

## 2016-11-20 DIAGNOSIS — Z791 Long term (current) use of non-steroidal anti-inflammatories (NSAID): Secondary | ICD-10-CM | POA: Insufficient documentation

## 2016-11-20 DIAGNOSIS — M7522 Bicipital tendinitis, left shoulder: Secondary | ICD-10-CM | POA: Diagnosis not present

## 2016-11-20 DIAGNOSIS — I252 Old myocardial infarction: Secondary | ICD-10-CM | POA: Insufficient documentation

## 2016-11-20 DIAGNOSIS — Z7982 Long term (current) use of aspirin: Secondary | ICD-10-CM | POA: Insufficient documentation

## 2016-11-20 DIAGNOSIS — I429 Cardiomyopathy, unspecified: Secondary | ICD-10-CM | POA: Insufficient documentation

## 2016-11-20 DIAGNOSIS — M1732 Unilateral post-traumatic osteoarthritis, left knee: Secondary | ICD-10-CM | POA: Insufficient documentation

## 2016-11-20 DIAGNOSIS — Z5181 Encounter for therapeutic drug level monitoring: Secondary | ICD-10-CM

## 2016-11-20 DIAGNOSIS — E7800 Pure hypercholesterolemia, unspecified: Secondary | ICD-10-CM | POA: Insufficient documentation

## 2016-11-20 DIAGNOSIS — I10 Essential (primary) hypertension: Secondary | ICD-10-CM | POA: Insufficient documentation

## 2016-11-20 DIAGNOSIS — J45909 Unspecified asthma, uncomplicated: Secondary | ICD-10-CM | POA: Insufficient documentation

## 2016-11-20 DIAGNOSIS — L97822 Non-pressure chronic ulcer of other part of left lower leg with fat layer exposed: Secondary | ICD-10-CM | POA: Insufficient documentation

## 2016-11-20 MED ORDER — ALPRAZOLAM 0.5 MG PO TABS: 0.5000 mg | ORAL_TABLET | Freq: Every day | ORAL | 2 refills | Status: DC | PRN

## 2016-11-20 MED ORDER — LIDOCAINE 5 % EX OINT: 1.0000 "application " | TOPICAL_OINTMENT | Freq: Three times a day (TID) | CUTANEOUS | 5 refills | Status: DC | PRN

## 2016-11-20 MED ORDER — OXYCODONE HCL 15 MG PO TABS: 15.0000 mg | ORAL_TABLET | Freq: Three times a day (TID) | ORAL | 0 refills | Status: DC | PRN

## 2016-11-20 NOTE — Telephone Encounter (Signed)
On 11/20/2016 the [place] was reviewed no conflict was seen on the [place] with multiple prescribers. Mr. Eckert a signed narcotic contract with our office. If there were any discrepancies this would have been reported to his physician.
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
## 2016-11-21 ENCOUNTER — Ambulatory Visit (HOSPITAL_COMMUNITY)
Admission: RE | Admit: 2016-11-21 | Discharge: 2016-11-21 | Disposition: A | Discharge status: Home / Self Care | Payer: Medicare Other | Source: Ambulatory Visit | Attending: Surgery | Admitting: Surgery

## 2016-11-21 ENCOUNTER — Other Ambulatory Visit: Payer: Self-pay | Admitting: Surgery

## 2016-11-21 DIAGNOSIS — E669 Obesity, unspecified: Secondary | ICD-10-CM | POA: Diagnosis not present

## 2016-11-21 DIAGNOSIS — E7800 Pure hypercholesterolemia, unspecified: Secondary | ICD-10-CM | POA: Diagnosis not present

## 2016-11-21 DIAGNOSIS — Z791 Long term (current) use of non-steroidal anti-inflammatories (NSAID): Secondary | ICD-10-CM | POA: Diagnosis not present

## 2016-11-21 DIAGNOSIS — M869 Osteomyelitis, unspecified: Secondary | ICD-10-CM

## 2016-11-21 DIAGNOSIS — I10 Essential (primary) hypertension: Secondary | ICD-10-CM | POA: Diagnosis not present

## 2016-11-21 DIAGNOSIS — Z8781 Personal history of (healed) traumatic fracture: Secondary | ICD-10-CM | POA: Diagnosis not present

## 2016-11-21 DIAGNOSIS — S81802A Unspecified open wound, left lower leg, initial encounter: Secondary | ICD-10-CM | POA: Insufficient documentation

## 2016-11-21 DIAGNOSIS — Z7951 Long term (current) use of inhaled steroids: Secondary | ICD-10-CM | POA: Diagnosis not present

## 2016-11-21 DIAGNOSIS — L97222 Non-pressure chronic ulcer of left calf with fat layer exposed: Secondary | ICD-10-CM | POA: Diagnosis not present

## 2016-11-21 DIAGNOSIS — L97822 Non-pressure chronic ulcer of other part of left lower leg with fat layer exposed: Secondary | ICD-10-CM | POA: Diagnosis not present

## 2016-11-21 DIAGNOSIS — Z6836 Body mass index (BMI) 36.0-36.9, adult: Secondary | ICD-10-CM | POA: Diagnosis not present

## 2016-11-21 DIAGNOSIS — Z79899 Other long term (current) drug therapy: Secondary | ICD-10-CM | POA: Diagnosis not present

## 2016-11-21 DIAGNOSIS — J45909 Unspecified asthma, uncomplicated: Secondary | ICD-10-CM | POA: Diagnosis not present

## 2016-11-21 DIAGNOSIS — I252 Old myocardial infarction: Secondary | ICD-10-CM | POA: Diagnosis not present

## 2016-11-21 DIAGNOSIS — Z7982 Long term (current) use of aspirin: Secondary | ICD-10-CM | POA: Diagnosis not present

## 2016-11-21 DIAGNOSIS — Z4689 Encounter for fitting and adjustment of other specified devices: Secondary | ICD-10-CM | POA: Diagnosis not present

## 2016-11-21 DIAGNOSIS — I429 Cardiomyopathy, unspecified: Secondary | ICD-10-CM | POA: Diagnosis not present

## 2016-11-26 LAB — DRUG TOX MONITOR 1 W/CONF, ORAL FLD
Amphetamines: NEGATIVE ng/mL (ref <10)
BENZODIAZEPINES: NEGATIVE ng/mL (ref <0.50)
BUPRENORPHINE: NEGATIVE ng/mL (ref <0.025)
Barbiturates: NEGATIVE ng/mL (ref <10)
CODEINE: NEGATIVE ng/mL (ref <2.5)
Cocaine: NEGATIVE ng/mL (ref <2.5)
DIHYDROCODEINE: NEGATIVE ng/mL (ref <2.5)
FENTANYL: NEGATIVE ng/mL (ref <0.10)
HYDROCODONE: NEGATIVE ng/mL (ref <2.5)
Heroin Metabolite: NEGATIVE ng/mL (ref <1.0)
Hydromorphone: NEGATIVE ng/mL (ref <2.5)
MARIJUANA: NEGATIVE ng/mL (ref <2.5)
MDMA: NEGATIVE ng/mL (ref <10)
MEPERIDINE: NEGATIVE ng/mL (ref <5.0)
MEPROBAMATE: NEGATIVE ng/mL (ref <2.5)
Methadone: NEGATIVE ng/mL (ref <5.0)
Morphine: NEGATIVE ng/mL (ref <2.5)
NORHYDROCODONE: NEGATIVE ng/mL (ref <2.5)
Nicotine Metabolite: NEGATIVE ng/mL (ref <5.0)
Noroxycodone: 3.4 ng/mL — ABNORMAL HIGH (ref <2.5)
OPIATES: POSITIVE ng/mL — AB (ref <2.5)
Oxycodone: 93.2 ng/mL — ABNORMAL HIGH (ref <2.5)
Oxymorphone: NEGATIVE ng/mL (ref <2.5)
PHENCYCLIDINE: NEGATIVE ng/mL (ref <10)
PROPOXYPHENE: NEGATIVE ng/mL (ref <5.0)
Tapentadol: NEGATIVE ng/mL (ref <5.0)
Tramadol: NEGATIVE ng/mL (ref <5.0)
Zolpidem: NEGATIVE ng/mL (ref <5.0)

## 2016-11-26 LAB — DRUG TOX METHYLPHEN W/CONF,ORAL FLD: METHYLPHENIDATE: NEGATIVE ng/mL (ref <1.0)

## 2016-11-26 LAB — DRUG TOX ALC METAB W/CON, ORAL FLD: Alcohol Metabolite: NEGATIVE ng/mL (ref <25)

## 2016-12-06 ENCOUNTER — Telehealth: Payer: Self-pay | Admitting: *Deleted

## 2016-12-06 NOTE — Telephone Encounter (Signed)
Oral swab drug screen was consistent for prescribed medications.  ?

## 2016-12-17 ENCOUNTER — Ambulatory Visit: Payer: Medicare Other | Admitting: Infectious Diseases

## 2016-12-18 ENCOUNTER — Encounter (HOSPITAL_BASED_OUTPATIENT_CLINIC_OR_DEPARTMENT_OTHER): Payer: Medicare Other | Attending: Surgery

## 2016-12-18 DIAGNOSIS — I10 Essential (primary) hypertension: Secondary | ICD-10-CM | POA: Insufficient documentation

## 2016-12-18 DIAGNOSIS — L97829 Non-pressure chronic ulcer of other part of left lower leg with unspecified severity: Secondary | ICD-10-CM | POA: Insufficient documentation

## 2016-12-19 DIAGNOSIS — L97829 Non-pressure chronic ulcer of other part of left lower leg with unspecified severity: Secondary | ICD-10-CM | POA: Diagnosis not present

## 2016-12-19 DIAGNOSIS — I10 Essential (primary) hypertension: Secondary | ICD-10-CM | POA: Diagnosis not present

## 2016-12-19 DIAGNOSIS — L97222 Non-pressure chronic ulcer of left calf with fat layer exposed: Secondary | ICD-10-CM | POA: Diagnosis not present

## 2017-01-10 ENCOUNTER — Other Ambulatory Visit: Payer: Self-pay

## 2017-01-15 ENCOUNTER — Encounter: Payer: Medicare Other | Attending: Physical Medicine and Rehabilitation | Admitting: Registered Nurse

## 2017-01-15 ENCOUNTER — Encounter: Payer: Self-pay | Admitting: Registered Nurse

## 2017-01-15 ENCOUNTER — Encounter: Payer: Self-pay | Admitting: Internal Medicine

## 2017-01-15 ENCOUNTER — Ambulatory Visit (INDEPENDENT_AMBULATORY_CARE_PROVIDER_SITE_OTHER): Payer: Medicare Other | Admitting: Internal Medicine

## 2017-01-15 ENCOUNTER — Telehealth: Payer: Self-pay | Admitting: *Deleted

## 2017-01-15 ENCOUNTER — Telehealth: Payer: Self-pay | Admitting: Registered Nurse

## 2017-01-15 VITALS — BP 115/78 | HR 68 | Resp 14

## 2017-01-15 DIAGNOSIS — M7522 Bicipital tendinitis, left shoulder: Secondary | ICD-10-CM | POA: Insufficient documentation

## 2017-01-15 DIAGNOSIS — M722 Plantar fascial fibromatosis: Secondary | ICD-10-CM | POA: Insufficient documentation

## 2017-01-15 DIAGNOSIS — S8412XS Injury of peroneal nerve at lower leg level, left leg, sequela: Secondary | ICD-10-CM

## 2017-01-15 DIAGNOSIS — G894 Chronic pain syndrome: Secondary | ICD-10-CM

## 2017-01-15 DIAGNOSIS — Z79899 Other long term (current) drug therapy: Secondary | ICD-10-CM | POA: Diagnosis not present

## 2017-01-15 DIAGNOSIS — M1732 Unilateral post-traumatic osteoarthritis, left knee: Secondary | ICD-10-CM

## 2017-01-15 DIAGNOSIS — Z5181 Encounter for therapeutic drug level monitoring: Secondary | ICD-10-CM

## 2017-01-15 DIAGNOSIS — S8411XA Injury of peroneal nerve at lower leg level, right leg, initial encounter: Secondary | ICD-10-CM | POA: Insufficient documentation

## 2017-01-15 DIAGNOSIS — T148XXA Other injury of unspecified body region, initial encounter: Secondary | ICD-10-CM

## 2017-01-15 MED ORDER — OXYCODONE HCL 15 MG PO TABS: 15.0000 mg | ORAL_TABLET | Freq: Three times a day (TID) | ORAL | 0 refills | Status: DC | PRN

## 2017-01-15 MED ORDER — PREGABALIN 150 MG PO CAPS: 150.0000 mg | ORAL_CAPSULE | Freq: Three times a day (TID) | ORAL | 3 refills | Status: DC

## 2017-01-15 MED ORDER — PREGABALIN 150 MG PO CAPS: 150.0000 mg | ORAL_CAPSULE | Freq: Three times a day (TID) | ORAL | 0 refills | Status: DC

## 2017-01-15 NOTE — Telephone Encounter (Signed)
Victor [name] is asking for Victor [name] to call him in reference to a medication she gave him at his appt.
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
## 2017-01-15 NOTE — Telephone Encounter (Signed)
On 01/15/2017 the [facility] was reviewed no conflict was seen on the [facility] with multiple prescribers. [name] has a signed narcotic contract with our office. If there were any discrepancies this would have been reported to his physician.

## 2017-01-16 ENCOUNTER — Ambulatory Visit: Payer: Medicare Other | Admitting: Infectious Diseases

## 2017-01-16 ENCOUNTER — Encounter (HOSPITAL_BASED_OUTPATIENT_CLINIC_OR_DEPARTMENT_OTHER): Payer: Medicare Other | Attending: Surgery

## 2017-01-16 ENCOUNTER — Ambulatory Visit: Payer: Medicare Other | Admitting: Interventional Cardiology

## 2017-01-16 DIAGNOSIS — L97222 Non-pressure chronic ulcer of left calf with fat layer exposed: Secondary | ICD-10-CM | POA: Diagnosis not present

## 2017-01-16 DIAGNOSIS — L97229 Non-pressure chronic ulcer of left calf with unspecified severity: Secondary | ICD-10-CM | POA: Insufficient documentation

## 2017-01-16 DIAGNOSIS — I10 Essential (primary) hypertension: Secondary | ICD-10-CM | POA: Diagnosis not present

## 2017-01-16 DIAGNOSIS — T148XXA Other injury of unspecified body region, initial encounter: Secondary | ICD-10-CM | POA: Insufficient documentation

## 2017-01-16 MED FILL — oxyCODONE HCL 15 MG TABS: 15 | 30 days supply | Qty: 90 | Fill #0

## 2017-01-16 NOTE — Telephone Encounter (Signed)
I spoke to Mr. Davilla yesterday (01/16/17), all questions answered.
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
## 2017-01-17 DIAGNOSIS — Z23 Encounter for immunization: Secondary | ICD-10-CM | POA: Diagnosis not present

## 2017-01-17 DIAGNOSIS — J45909 Unspecified asthma, uncomplicated: Secondary | ICD-10-CM | POA: Diagnosis not present

## 2017-01-23 ENCOUNTER — Other Ambulatory Visit: Payer: Self-pay | Admitting: Registered Nurse

## 2017-01-23 DIAGNOSIS — L97229 Non-pressure chronic ulcer of left calf with unspecified severity: Secondary | ICD-10-CM | POA: Diagnosis not present

## 2017-01-23 DIAGNOSIS — L97222 Non-pressure chronic ulcer of left calf with fat layer exposed: Secondary | ICD-10-CM | POA: Diagnosis not present

## 2017-01-23 DIAGNOSIS — I10 Essential (primary) hypertension: Secondary | ICD-10-CM | POA: Diagnosis not present

## 2017-02-03 ENCOUNTER — Other Ambulatory Visit: Payer: Self-pay | Admitting: Interventional Cardiology

## 2017-02-11 ENCOUNTER — Ambulatory Visit: Payer: Medicare Other | Admitting: Interventional Cardiology

## 2017-02-13 ENCOUNTER — Encounter (HOSPITAL_BASED_OUTPATIENT_CLINIC_OR_DEPARTMENT_OTHER): Payer: Medicare Other

## 2017-02-17 ENCOUNTER — Other Ambulatory Visit: Payer: Self-pay | Admitting: Interventional Cardiology

## 2017-02-18 ENCOUNTER — Telehealth: Payer: Self-pay | Admitting: Registered Nurse

## 2017-02-18 ENCOUNTER — Ambulatory Visit: Payer: Medicare Other | Admitting: Infectious Diseases

## 2017-02-18 MED ORDER — PREGABALIN 150 MG PO CAPS: 150.0000 mg | ORAL_CAPSULE | Freq: Three times a day (TID) | ORAL | 3 refills | Status: DC

## 2017-02-18 NOTE — Telephone Encounter (Signed)
Lyrica Prescription Printed and Faxed. To [facility].

## 2017-03-08 ENCOUNTER — Telehealth: Payer: Self-pay | Admitting: *Deleted

## 2017-03-08 MED ORDER — OXYCODONE HCL 15 MG PO TABS: 15.0000 mg | ORAL_TABLET | Freq: Three times a day (TID) | ORAL | 0 refills | Status: DC | PRN
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
## 2017-03-13 ENCOUNTER — Encounter: Payer: Medicare Other | Attending: Physical Medicine and Rehabilitation | Admitting: Physical Medicine & Rehabilitation

## 2017-03-13 ENCOUNTER — Encounter: Payer: Self-pay | Admitting: Physical Medicine & Rehabilitation

## 2017-03-13 ENCOUNTER — Encounter (HOSPITAL_BASED_OUTPATIENT_CLINIC_OR_DEPARTMENT_OTHER): Payer: Medicare Other | Attending: Surgery

## 2017-03-13 ENCOUNTER — Telehealth: Payer: Self-pay | Admitting: Physical Medicine & Rehabilitation

## 2017-03-13 VITALS — BP 133/90 | HR 74

## 2017-03-13 DIAGNOSIS — I5022 Chronic systolic (congestive) heart failure: Secondary | ICD-10-CM | POA: Insufficient documentation

## 2017-03-13 DIAGNOSIS — Z79899 Other long term (current) drug therapy: Secondary | ICD-10-CM | POA: Diagnosis not present

## 2017-03-13 DIAGNOSIS — I429 Cardiomyopathy, unspecified: Secondary | ICD-10-CM | POA: Diagnosis not present

## 2017-03-13 DIAGNOSIS — M1732 Unilateral post-traumatic osteoarthritis, left knee: Secondary | ICD-10-CM | POA: Insufficient documentation

## 2017-03-13 DIAGNOSIS — L97822 Non-pressure chronic ulcer of other part of left lower leg with fat layer exposed: Secondary | ICD-10-CM | POA: Insufficient documentation

## 2017-03-13 DIAGNOSIS — Z7951 Long term (current) use of inhaled steroids: Secondary | ICD-10-CM | POA: Insufficient documentation

## 2017-03-13 DIAGNOSIS — S8411XA Injury of peroneal nerve at lower leg level, right leg, initial encounter: Secondary | ICD-10-CM | POA: Diagnosis present

## 2017-03-13 DIAGNOSIS — Z791 Long term (current) use of non-steroidal anti-inflammatories (NSAID): Secondary | ICD-10-CM | POA: Diagnosis not present

## 2017-03-13 DIAGNOSIS — J45909 Unspecified asthma, uncomplicated: Secondary | ICD-10-CM | POA: Diagnosis not present

## 2017-03-13 DIAGNOSIS — S8412XS Injury of peroneal nerve at lower leg level, left leg, sequela: Secondary | ICD-10-CM | POA: Diagnosis not present

## 2017-03-13 DIAGNOSIS — G894 Chronic pain syndrome: Secondary | ICD-10-CM

## 2017-03-13 DIAGNOSIS — M7522 Bicipital tendinitis, left shoulder: Secondary | ICD-10-CM | POA: Insufficient documentation

## 2017-03-13 DIAGNOSIS — M722 Plantar fascial fibromatosis: Secondary | ICD-10-CM | POA: Diagnosis present

## 2017-03-13 DIAGNOSIS — L97222 Non-pressure chronic ulcer of left calf with fat layer exposed: Secondary | ICD-10-CM | POA: Diagnosis not present

## 2017-03-13 NOTE — Telephone Encounter (Signed)
Zella Ball, when you return could you write a prescription for Mr. Imburgia for his oxycodone.  There was only one in the file today.  If you could then call him and have him pick it up that would be great. thx

## 2017-03-13 NOTE — Patient Instructions (Signed)
You probably need a tighter neoprene sleeve around your leg when you work out.    PLEASE FEEL FREE TO CALL OUR OFFICE WITH ANY PROBLEMS OR QUESTIONS ([phone])
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
## 2017-03-19 DIAGNOSIS — I5022 Chronic systolic (congestive) heart failure: Secondary | ICD-10-CM | POA: Diagnosis not present

## 2017-03-19 DIAGNOSIS — L97222 Non-pressure chronic ulcer of left calf with fat layer exposed: Secondary | ICD-10-CM | POA: Diagnosis not present

## 2017-03-19 DIAGNOSIS — Z791 Long term (current) use of non-steroidal anti-inflammatories (NSAID): Secondary | ICD-10-CM | POA: Diagnosis not present

## 2017-03-19 DIAGNOSIS — I429 Cardiomyopathy, unspecified: Secondary | ICD-10-CM | POA: Diagnosis not present

## 2017-03-19 DIAGNOSIS — J45909 Unspecified asthma, uncomplicated: Secondary | ICD-10-CM | POA: Diagnosis not present

## 2017-03-19 DIAGNOSIS — Z7951 Long term (current) use of inhaled steroids: Secondary | ICD-10-CM | POA: Diagnosis not present

## 2017-03-19 DIAGNOSIS — L97822 Non-pressure chronic ulcer of other part of left lower leg with fat layer exposed: Secondary | ICD-10-CM | POA: Diagnosis not present

## 2017-03-20 ENCOUNTER — Other Ambulatory Visit: Payer: Self-pay

## 2017-03-20 DIAGNOSIS — H4041X3 Glaucoma secondary to eye inflammation, right eye, severe stage: Secondary | ICD-10-CM | POA: Diagnosis not present

## 2017-03-20 MED ORDER — OXYCODONE HCL 15 MG PO TABS: 15.0000 mg | ORAL_TABLET | Freq: Three times a day (TID) | ORAL | 0 refills | Status: DC | PRN

## 2017-03-20 NOTE — Telephone Encounter (Signed)
Prescription printed out and waiting for eunice to sign which then the patient will be called and informed that he can come by and pick up

## 2017-03-20 NOTE — Telephone Encounter (Signed)
Placed a call to Victor [name],  He is aware he can pick up his prescription, he verbalizes understanding.

## 2017-03-20 NOTE — Telephone Encounter (Signed)
Patient called requesting refill on oxycodone 15mg .

## 2017-03-26 ENCOUNTER — Ambulatory Visit: Payer: Medicare Other | Admitting: Interventional Cardiology

## 2017-03-26 DIAGNOSIS — L97222 Non-pressure chronic ulcer of left calf with fat layer exposed: Secondary | ICD-10-CM | POA: Diagnosis not present

## 2017-03-26 DIAGNOSIS — J45909 Unspecified asthma, uncomplicated: Secondary | ICD-10-CM | POA: Diagnosis not present

## 2017-03-26 DIAGNOSIS — Z7951 Long term (current) use of inhaled steroids: Secondary | ICD-10-CM | POA: Diagnosis not present

## 2017-03-26 DIAGNOSIS — Z791 Long term (current) use of non-steroidal anti-inflammatories (NSAID): Secondary | ICD-10-CM | POA: Diagnosis not present

## 2017-03-26 DIAGNOSIS — I429 Cardiomyopathy, unspecified: Secondary | ICD-10-CM | POA: Diagnosis not present

## 2017-03-26 DIAGNOSIS — L97822 Non-pressure chronic ulcer of other part of left lower leg with fat layer exposed: Secondary | ICD-10-CM | POA: Diagnosis not present

## 2017-03-26 DIAGNOSIS — I5022 Chronic systolic (congestive) heart failure: Secondary | ICD-10-CM | POA: Diagnosis not present

## 2017-04-23 ENCOUNTER — Encounter (HOSPITAL_BASED_OUTPATIENT_CLINIC_OR_DEPARTMENT_OTHER): Payer: Medicare Other | Attending: Surgery

## 2017-04-23 DIAGNOSIS — L97822 Non-pressure chronic ulcer of other part of left lower leg with fat layer exposed: Secondary | ICD-10-CM | POA: Diagnosis not present

## 2017-04-23 DIAGNOSIS — I10 Essential (primary) hypertension: Secondary | ICD-10-CM | POA: Diagnosis not present

## 2017-04-23 DIAGNOSIS — L97222 Non-pressure chronic ulcer of left calf with fat layer exposed: Secondary | ICD-10-CM | POA: Diagnosis not present
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
## 2017-04-24 ENCOUNTER — Ambulatory Visit (INDEPENDENT_AMBULATORY_CARE_PROVIDER_SITE_OTHER): Payer: Medicare Other | Admitting: Interventional Cardiology

## 2017-04-24 ENCOUNTER — Encounter: Payer: Self-pay | Admitting: Interventional Cardiology

## 2017-04-24 VITALS — BP 128/88 | HR 76 | Ht 73.0 in | Wt 286.1 lb

## 2017-04-24 DIAGNOSIS — R600 Localized edema: Secondary | ICD-10-CM | POA: Diagnosis not present

## 2017-04-24 DIAGNOSIS — I428 Other cardiomyopathies: Secondary | ICD-10-CM | POA: Diagnosis not present

## 2017-04-24 DIAGNOSIS — E669 Obesity, unspecified: Secondary | ICD-10-CM | POA: Diagnosis not present

## 2017-04-24 DIAGNOSIS — E782 Mixed hyperlipidemia: Secondary | ICD-10-CM

## 2017-04-24 NOTE — Patient Instructions (Signed)

## 2017-05-06 ENCOUNTER — Other Ambulatory Visit: Payer: Self-pay | Admitting: Interventional Cardiology

## 2017-05-10 ENCOUNTER — Encounter: Payer: Self-pay | Admitting: Registered Nurse

## 2017-05-10 ENCOUNTER — Encounter: Payer: Medicare Other | Attending: Physical Medicine and Rehabilitation | Admitting: Registered Nurse

## 2017-05-10 VITALS — BP 121/83 | HR 73

## 2017-05-10 DIAGNOSIS — Z79899 Other long term (current) drug therapy: Secondary | ICD-10-CM

## 2017-05-10 DIAGNOSIS — S8412XS Injury of peroneal nerve at lower leg level, left leg, sequela: Secondary | ICD-10-CM

## 2017-05-10 DIAGNOSIS — M7522 Bicipital tendinitis, left shoulder: Secondary | ICD-10-CM | POA: Insufficient documentation

## 2017-05-10 DIAGNOSIS — G894 Chronic pain syndrome: Secondary | ICD-10-CM | POA: Diagnosis not present

## 2017-05-10 DIAGNOSIS — M1732 Unilateral post-traumatic osteoarthritis, left knee: Secondary | ICD-10-CM | POA: Diagnosis not present

## 2017-05-10 DIAGNOSIS — Z5181 Encounter for therapeutic drug level monitoring: Secondary | ICD-10-CM | POA: Diagnosis not present

## 2017-05-10 DIAGNOSIS — M722 Plantar fascial fibromatosis: Secondary | ICD-10-CM | POA: Insufficient documentation

## 2017-05-10 DIAGNOSIS — S8411XA Injury of peroneal nerve at lower leg level, right leg, initial encounter: Secondary | ICD-10-CM | POA: Diagnosis not present

## 2017-05-10 MED ORDER — OXYCODONE HCL 15 MG PO TABS: 15.0000 mg | ORAL_TABLET | Freq: Three times a day (TID) | ORAL | 0 refills | Status: DC | PRN

## 2017-05-10 MED ORDER — ALPRAZOLAM 0.5 MG PO TABS: ORAL_TABLET | ORAL | 2 refills | Status: DC
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
## 2017-05-13 ENCOUNTER — Encounter (HOSPITAL_BASED_OUTPATIENT_CLINIC_OR_DEPARTMENT_OTHER): Payer: Medicare Other | Attending: Internal Medicine

## 2017-05-13 DIAGNOSIS — I10 Essential (primary) hypertension: Secondary | ICD-10-CM | POA: Insufficient documentation

## 2017-05-13 DIAGNOSIS — L97222 Non-pressure chronic ulcer of left calf with fat layer exposed: Secondary | ICD-10-CM | POA: Diagnosis not present

## 2017-05-14 DIAGNOSIS — I10 Essential (primary) hypertension: Secondary | ICD-10-CM | POA: Diagnosis not present

## 2017-05-14 DIAGNOSIS — L97222 Non-pressure chronic ulcer of left calf with fat layer exposed: Secondary | ICD-10-CM | POA: Diagnosis not present

## 2017-05-14 DIAGNOSIS — S81802A Unspecified open wound, left lower leg, initial encounter: Secondary | ICD-10-CM | POA: Diagnosis not present

## 2017-05-15 ENCOUNTER — Telehealth: Payer: Self-pay | Admitting: *Deleted

## 2017-05-15 NOTE — Telephone Encounter (Signed)
Oral swab drug screen was consistent for prescribed medication oxycodone.  We are prescribing alprazolam but I do not have information on the last dose taken in order to determine if it is appropriate or not being negative in the oral swab and it is as needed.

## 2017-05-16 LAB — DRUG TOX MONITOR 1 W/CONF, ORAL FLD
Amphetamines: NEGATIVE ng/mL (ref <10)
BUPRENORPHINE: NEGATIVE ng/mL (ref <0.10)
Barbiturates: NEGATIVE ng/mL (ref <10)
Benzodiazepines: NEGATIVE ng/mL (ref <0.50)
CODEINE: NEGATIVE ng/mL (ref <2.5)
Cocaine: NEGATIVE ng/mL (ref <5.0)
Dihydrocodeine: NEGATIVE ng/mL (ref <2.5)
FENTANYL: NEGATIVE ng/mL (ref <0.10)
HEROIN METABOLITE: NEGATIVE ng/mL (ref <1.0)
Hydrocodone: NEGATIVE ng/mL (ref <2.5)
Hydromorphone: NEGATIVE ng/mL (ref <2.5)
MARIJUANA: NEGATIVE ng/mL (ref <2.5)
MDMA: NEGATIVE ng/mL (ref <10)
MEPROBAMATE: NEGATIVE ng/mL (ref <2.5)
METHADONE: NEGATIVE ng/mL (ref <5.0)
MORPHINE: NEGATIVE ng/mL (ref <2.5)
NOROXYCODONE: NEGATIVE ng/mL (ref <2.5)
Nicotine Metabolite: NEGATIVE ng/mL (ref <5.0)
Norhydrocodone: NEGATIVE ng/mL (ref <2.5)
OXYCODONE: 66.3 ng/mL — AB (ref <2.5)
Opiates: POSITIVE ng/mL — AB (ref <2.5)
Oxymorphone: NEGATIVE ng/mL (ref <2.5)
Phencyclidine: NEGATIVE ng/mL (ref <10)
TAPENTADOL: NEGATIVE ng/mL (ref <5.0)
Tramadol: NEGATIVE ng/mL (ref <5.0)
Zolpidem: NEGATIVE ng/mL (ref <5.0)

## 2017-05-16 LAB — DRUG TOX ALC METAB W/CON, ORAL FLD: ALCOHOL METABOLITE: NEGATIVE ng/mL (ref <25)

## 2017-05-20 ENCOUNTER — Other Ambulatory Visit: Payer: Self-pay | Admitting: Interventional Cardiology

## 2017-06-11 ENCOUNTER — Other Ambulatory Visit: Payer: Self-pay | Admitting: Internal Medicine

## 2017-06-11 ENCOUNTER — Other Ambulatory Visit (HOSPITAL_COMMUNITY)
Admission: RE | Admit: 2017-06-11 | Discharge: 2017-06-11 | Disposition: A | Discharge status: Home / Self Care | Payer: Medicare Other | Source: Other Acute Inpatient Hospital | Attending: Internal Medicine | Admitting: Internal Medicine

## 2017-06-11 ENCOUNTER — Ambulatory Visit (HOSPITAL_COMMUNITY)
Admission: RE | Admit: 2017-06-11 | Discharge: 2017-06-11 | Disposition: A | Discharge status: Home / Self Care | Payer: Medicare Other | Source: Ambulatory Visit | Attending: Internal Medicine | Admitting: Internal Medicine

## 2017-06-11 ENCOUNTER — Encounter (HOSPITAL_BASED_OUTPATIENT_CLINIC_OR_DEPARTMENT_OTHER): Payer: Medicare Other | Attending: Internal Medicine

## 2017-06-11 DIAGNOSIS — S81802A Unspecified open wound, left lower leg, initial encounter: Secondary | ICD-10-CM | POA: Diagnosis not present

## 2017-06-11 DIAGNOSIS — I10 Essential (primary) hypertension: Secondary | ICD-10-CM | POA: Insufficient documentation

## 2017-06-11 DIAGNOSIS — M869 Osteomyelitis, unspecified: Secondary | ICD-10-CM

## 2017-06-11 DIAGNOSIS — L97222 Non-pressure chronic ulcer of left calf with fat layer exposed: Secondary | ICD-10-CM | POA: Insufficient documentation

## 2017-06-11 DIAGNOSIS — B9561 Methicillin susceptible Staphylococcus aureus infection as the cause of diseases classified elsewhere: Secondary | ICD-10-CM | POA: Insufficient documentation

## 2017-06-11 DIAGNOSIS — L97822 Non-pressure chronic ulcer of other part of left lower leg with fat layer exposed: Secondary | ICD-10-CM | POA: Insufficient documentation

## 2017-06-14 DIAGNOSIS — Z79891 Long term (current) use of opiate analgesic: Secondary | ICD-10-CM | POA: Diagnosis not present

## 2017-06-14 DIAGNOSIS — N529 Male erectile dysfunction, unspecified: Secondary | ICD-10-CM | POA: Diagnosis not present

## 2017-06-14 LAB — AEROBIC CULTURE W GRAM STAIN (SUPERFICIAL SPECIMEN)

## 2017-06-14 LAB — AEROBIC CULTURE  (SUPERFICIAL SPECIMEN)

## 2017-06-18 DIAGNOSIS — L97822 Non-pressure chronic ulcer of other part of left lower leg with fat layer exposed: Secondary | ICD-10-CM | POA: Diagnosis not present

## 2017-06-18 DIAGNOSIS — I10 Essential (primary) hypertension: Secondary | ICD-10-CM | POA: Diagnosis not present

## 2017-06-18 DIAGNOSIS — B9561 Methicillin susceptible Staphylococcus aureus infection as the cause of diseases classified elsewhere: Secondary | ICD-10-CM | POA: Diagnosis not present

## 2017-06-18 DIAGNOSIS — S81802A Unspecified open wound, left lower leg, initial encounter: Secondary | ICD-10-CM | POA: Diagnosis not present

## 2017-07-04 DIAGNOSIS — N529 Male erectile dysfunction, unspecified: Secondary | ICD-10-CM | POA: Diagnosis not present

## 2017-07-05 ENCOUNTER — Encounter: Payer: Self-pay | Admitting: Registered Nurse

## 2017-07-05 ENCOUNTER — Encounter: Payer: Medicare Other | Attending: Physical Medicine and Rehabilitation | Admitting: Registered Nurse

## 2017-07-05 VITALS — BP 141/82 | HR 80

## 2017-07-05 DIAGNOSIS — M7522 Bicipital tendinitis, left shoulder: Secondary | ICD-10-CM | POA: Diagnosis not present

## 2017-07-05 DIAGNOSIS — S8411XA Injury of peroneal nerve at lower leg level, right leg, initial encounter: Secondary | ICD-10-CM | POA: Insufficient documentation

## 2017-07-05 DIAGNOSIS — F411 Generalized anxiety disorder: Secondary | ICD-10-CM

## 2017-07-05 DIAGNOSIS — Z5181 Encounter for therapeutic drug level monitoring: Secondary | ICD-10-CM | POA: Diagnosis not present

## 2017-07-05 DIAGNOSIS — S8412XS Injury of peroneal nerve at lower leg level, left leg, sequela: Secondary | ICD-10-CM | POA: Diagnosis not present

## 2017-07-05 DIAGNOSIS — M722 Plantar fascial fibromatosis: Secondary | ICD-10-CM | POA: Insufficient documentation

## 2017-07-05 DIAGNOSIS — M1732 Unilateral post-traumatic osteoarthritis, left knee: Secondary | ICD-10-CM | POA: Insufficient documentation

## 2017-07-05 DIAGNOSIS — Z79899 Other long term (current) drug therapy: Secondary | ICD-10-CM | POA: Diagnosis not present

## 2017-07-05 DIAGNOSIS — G894 Chronic pain syndrome: Secondary | ICD-10-CM

## 2017-07-05 MED ORDER — OXYCODONE HCL 15 MG PO TABS: 15.0000 mg | ORAL_TABLET | Freq: Three times a day (TID) | ORAL | 0 refills | Status: DC | PRN
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
## 2017-07-09 ENCOUNTER — Encounter (HOSPITAL_BASED_OUTPATIENT_CLINIC_OR_DEPARTMENT_OTHER): Payer: Medicare Other | Attending: Internal Medicine

## 2017-07-09 ENCOUNTER — Other Ambulatory Visit: Payer: Self-pay | Admitting: Internal Medicine

## 2017-07-09 DIAGNOSIS — L03116 Cellulitis of left lower limb: Secondary | ICD-10-CM | POA: Diagnosis not present

## 2017-07-09 DIAGNOSIS — I110 Hypertensive heart disease with heart failure: Secondary | ICD-10-CM | POA: Diagnosis not present

## 2017-07-09 DIAGNOSIS — S81802A Unspecified open wound, left lower leg, initial encounter: Secondary | ICD-10-CM | POA: Diagnosis not present

## 2017-07-09 DIAGNOSIS — H4031X3 Glaucoma secondary to eye trauma, right eye, severe stage: Secondary | ICD-10-CM | POA: Diagnosis not present

## 2017-07-09 DIAGNOSIS — H31001 Unspecified chorioretinal scars, right eye: Secondary | ICD-10-CM | POA: Diagnosis not present

## 2017-07-09 DIAGNOSIS — I509 Heart failure, unspecified: Secondary | ICD-10-CM | POA: Insufficient documentation

## 2017-07-09 DIAGNOSIS — L97822 Non-pressure chronic ulcer of other part of left lower leg with fat layer exposed: Secondary | ICD-10-CM | POA: Diagnosis not present

## 2017-07-09 DIAGNOSIS — H2512 Age-related nuclear cataract, left eye: Secondary | ICD-10-CM | POA: Diagnosis not present

## 2017-07-09 DIAGNOSIS — H52203 Unspecified astigmatism, bilateral: Secondary | ICD-10-CM | POA: Diagnosis not present

## 2017-07-10 ENCOUNTER — Other Ambulatory Visit: Payer: Self-pay | Admitting: Internal Medicine

## 2017-07-10 DIAGNOSIS — L97222 Non-pressure chronic ulcer of left calf with fat layer exposed: Secondary | ICD-10-CM

## 2017-07-12 ENCOUNTER — Ambulatory Visit (HOSPITAL_COMMUNITY)
Admission: RE | Admit: 2017-07-12 | Discharge: 2017-07-12 | Disposition: A | Discharge status: Home / Self Care | Payer: Medicare Other | Source: Ambulatory Visit | Attending: Internal Medicine | Admitting: Internal Medicine

## 2017-07-12 ENCOUNTER — Ambulatory Visit (HOSPITAL_COMMUNITY): Payer: Medicare Other

## 2017-07-12 DIAGNOSIS — L97921 Non-pressure chronic ulcer of unspecified part of left lower leg limited to breakdown of skin: Secondary | ICD-10-CM | POA: Diagnosis not present

## 2017-07-12 DIAGNOSIS — L02415 Cutaneous abscess of right lower limb: Secondary | ICD-10-CM | POA: Diagnosis not present

## 2017-07-12 DIAGNOSIS — L97222 Non-pressure chronic ulcer of left calf with fat layer exposed: Secondary | ICD-10-CM | POA: Diagnosis not present

## 2017-07-16 ENCOUNTER — Other Ambulatory Visit: Payer: Self-pay | Admitting: Internal Medicine

## 2017-07-16 DIAGNOSIS — L97822 Non-pressure chronic ulcer of other part of left lower leg with fat layer exposed: Secondary | ICD-10-CM | POA: Diagnosis not present

## 2017-07-16 DIAGNOSIS — L0291 Cutaneous abscess, unspecified: Secondary | ICD-10-CM

## 2017-07-16 DIAGNOSIS — I110 Hypertensive heart disease with heart failure: Secondary | ICD-10-CM | POA: Diagnosis not present

## 2017-07-16 DIAGNOSIS — I509 Heart failure, unspecified: Secondary | ICD-10-CM | POA: Diagnosis not present

## 2017-07-16 DIAGNOSIS — S81802A Unspecified open wound, left lower leg, initial encounter: Secondary | ICD-10-CM | POA: Diagnosis not present

## 2017-07-16 DIAGNOSIS — B9561 Methicillin susceptible Staphylococcus aureus infection as the cause of diseases classified elsewhere: Secondary | ICD-10-CM | POA: Diagnosis not present

## 2017-08-06 ENCOUNTER — Encounter (HOSPITAL_BASED_OUTPATIENT_CLINIC_OR_DEPARTMENT_OTHER): Payer: Medicare Other | Attending: Internal Medicine

## 2017-08-06 DIAGNOSIS — I509 Heart failure, unspecified: Secondary | ICD-10-CM | POA: Insufficient documentation

## 2017-08-06 DIAGNOSIS — I110 Hypertensive heart disease with heart failure: Secondary | ICD-10-CM | POA: Insufficient documentation

## 2017-08-06 DIAGNOSIS — L97822 Non-pressure chronic ulcer of other part of left lower leg with fat layer exposed: Secondary | ICD-10-CM | POA: Insufficient documentation

## 2017-08-07 ENCOUNTER — Other Ambulatory Visit: Payer: Self-pay | Admitting: Radiology

## 2017-08-08 ENCOUNTER — Other Ambulatory Visit: Payer: Self-pay | Admitting: Student

## 2017-08-09 ENCOUNTER — Encounter (HOSPITAL_COMMUNITY): Payer: Self-pay

## 2017-08-09 ENCOUNTER — Ambulatory Visit (HOSPITAL_COMMUNITY)
Admission: RE | Admit: 2017-08-09 | Discharge: 2017-08-09 | Disposition: A | Discharge status: Home / Self Care | Payer: Medicare Other | Source: Ambulatory Visit | Attending: Internal Medicine | Admitting: Internal Medicine

## 2017-08-09 DIAGNOSIS — L0291 Cutaneous abscess, unspecified: Secondary | ICD-10-CM | POA: Diagnosis present

## 2017-08-09 DIAGNOSIS — L02818 Cutaneous abscess of other sites: Secondary | ICD-10-CM | POA: Insufficient documentation

## 2017-08-09 DIAGNOSIS — L03116 Cellulitis of left lower limb: Secondary | ICD-10-CM | POA: Diagnosis not present

## 2017-08-09 DIAGNOSIS — L02416 Cutaneous abscess of left lower limb: Secondary | ICD-10-CM | POA: Diagnosis not present

## 2017-08-09 LAB — CBC WITH DIFFERENTIAL/PLATELET
BASOS PCT: 1 %
Basophils Absolute: 0 10*3/uL (ref 0.0–0.1)
EOS ABS: 0.3 10*3/uL (ref 0.0–0.7)
Eosinophils Relative: 5 %
HCT: 49.8 % (ref 39.0–52.0)
HEMOGLOBIN: 16.8 g/dL (ref 13.0–17.0)
LYMPHS ABS: 1.6 10*3/uL (ref 0.7–4.0)
Lymphocytes Relative: 27 %
MCH: 30.5 pg (ref 26.0–34.0)
MCHC: 33.7 g/dL (ref 30.0–36.0)
MCV: 90.5 fL (ref 78.0–100.0)
Monocytes Absolute: 0.7 10*3/uL (ref 0.1–1.0)
Monocytes Relative: 12 %
NEUTROS PCT: 55 %
Neutro Abs: 3.3 10*3/uL (ref 1.7–7.7)
Platelets: 213 10*3/uL (ref 150–400)
RBC: 5.5 MIL/uL (ref 4.22–5.81)
RDW: 13.2 % (ref 11.5–15.5)
WBC: 6 10*3/uL (ref 4.0–10.5)

## 2017-08-09 LAB — PROTIME-INR
INR: 1.08
PROTHROMBIN TIME: 13.9 s (ref 11.4–15.2)

## 2017-08-09 LAB — BASIC METABOLIC PANEL
ANION GAP: 9 (ref 5–15)
BUN: 15 mg/dL (ref 6–20)
CHLORIDE: 107 mmol/L (ref 101–111)
CO2: 24 mmol/L (ref 22–32)
CREATININE: 0.94 mg/dL (ref 0.61–1.24)
Calcium: 9.2 mg/dL (ref 8.9–10.3)
GFR calc non Af Amer: >60 mL/min (ref >60)
Glucose, Bld: 99 mg/dL (ref 65–99)
Potassium: 3.9 mmol/L (ref 3.5–5.1)
SODIUM: 140 mmol/L (ref 135–145)

## 2017-08-09 MED ORDER — FENTANYL CITRATE (PF) 100 MCG/2ML IJ SOLN
INTRAMUSCULAR | Status: AC
  Filled 2017-08-09: qty 2

## 2017-08-09 MED ORDER — SODIUM CHLORIDE 0.9 % IV SOLN: INTRAVENOUS | Status: DC

## 2017-08-09 MED ORDER — MIDAZOLAM HCL 2 MG/2ML IJ SOLN
INTRAMUSCULAR | Status: AC
  Filled 2017-08-09: qty 2

## 2017-08-09 MED ORDER — LIDOCAINE HCL 1 % IJ SOLN
INTRAMUSCULAR | Status: AC
  Filled 2017-08-09: qty 10

## 2017-08-09 NOTE — Procedures (Signed)
L leg abscess aspiration One drop bloody fluid EBL 0 Comp 0
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
## 2017-08-09 NOTE — Discharge Instructions (Signed)
Needle Aspiration, Care After These instructions give you information about caring for yourself after your procedure. Your doctor may also give you more specific instructions. Call your doctor if you have any problems or questions after your procedure. Follow these instructions at home:  Rest as told by your doctor.  Take medicines only as told by your doctor.  There are many different ways to close and cover site, including stitches (sutures), skin glue, and adhesive strips. Follow instructions from your doctor about: ? How to take care of your biopsy site. ? When and how you should change your bandage (dressing). ? When you should remove your dressing. ? Removing whatever was used to close your biopsy site.  Check your site every day for signs of infection. Watch for: ? Redness, swelling, or pain. ? Fluid, blood, or pus. Contact a doctor if:  You have a fever.  You have redness, swelling, or pain at the biopsy site, and it lasts longer than a few days.  You have fluid, blood, or pus coming from the biopsy site.  You feel sick to your stomach (nauseous).  You throw up (vomit). Get help right away if:  You are short of breath.  You have trouble breathing.  Your chest hurts.  You feel dizzy or you pass out (faint).  You have bleeding that does not stop with pressure or a bandage.  You cough up blood.  Your belly (abdomen) hurts. This information is not intended to replace advice given to you by your health care provider. Make sure you discuss any questions you have with your health care provider. Document Released: 04/05/2008 Document Revised: 09/29/2015 Document Reviewed: 04/19/2014 Elsevier Interactive Patient Education  [employer].

## 2017-08-14 LAB — AEROBIC/ANAEROBIC CULTURE W GRAM STAIN (SURGICAL/DEEP WOUND): Culture: NO GROWTH

## 2017-08-14 LAB — AEROBIC/ANAEROBIC CULTURE (SURGICAL/DEEP WOUND)

## 2017-08-15 ENCOUNTER — Ambulatory Visit (HOSPITAL_COMMUNITY)
Admission: RE | Admit: 2017-08-15 | Discharge: 2017-08-15 | Disposition: A | Discharge status: Home / Self Care | Payer: Medicare Other | Source: Ambulatory Visit | Attending: Internal Medicine | Admitting: Internal Medicine

## 2017-08-15 ENCOUNTER — Other Ambulatory Visit (HOSPITAL_BASED_OUTPATIENT_CLINIC_OR_DEPARTMENT_OTHER): Payer: Self-pay | Admitting: Internal Medicine

## 2017-08-15 DIAGNOSIS — T1490XS Injury, unspecified, sequela: Secondary | ICD-10-CM | POA: Diagnosis not present

## 2017-08-15 DIAGNOSIS — I110 Hypertensive heart disease with heart failure: Secondary | ICD-10-CM | POA: Diagnosis not present

## 2017-08-15 DIAGNOSIS — M79662 Pain in left lower leg: Secondary | ICD-10-CM | POA: Insufficient documentation

## 2017-08-15 DIAGNOSIS — L97822 Non-pressure chronic ulcer of other part of left lower leg with fat layer exposed: Secondary | ICD-10-CM | POA: Diagnosis not present

## 2017-08-15 DIAGNOSIS — I509 Heart failure, unspecified: Secondary | ICD-10-CM | POA: Diagnosis not present

## 2017-08-15 DIAGNOSIS — S81802A Unspecified open wound, left lower leg, initial encounter: Secondary | ICD-10-CM | POA: Diagnosis not present

## 2017-08-15 DIAGNOSIS — B9562 Methicillin resistant Staphylococcus aureus infection as the cause of diseases classified elsewhere: Secondary | ICD-10-CM | POA: Diagnosis not present

## 2017-08-16 ENCOUNTER — Other Ambulatory Visit (HOSPITAL_BASED_OUTPATIENT_CLINIC_OR_DEPARTMENT_OTHER): Payer: Self-pay | Admitting: Internal Medicine

## 2017-08-16 DIAGNOSIS — M869 Osteomyelitis, unspecified: Secondary | ICD-10-CM

## 2017-08-21 ENCOUNTER — Ambulatory Visit (HOSPITAL_COMMUNITY)
Admission: RE | Admit: 2017-08-21 | Discharge: 2017-08-21 | Disposition: A | Discharge status: Home / Self Care | Payer: Medicare Other | Source: Ambulatory Visit | Attending: Internal Medicine | Admitting: Internal Medicine

## 2017-08-21 DIAGNOSIS — M869 Osteomyelitis, unspecified: Secondary | ICD-10-CM

## 2017-08-21 DIAGNOSIS — S81802A Unspecified open wound, left lower leg, initial encounter: Secondary | ICD-10-CM | POA: Diagnosis not present

## 2017-08-21 DIAGNOSIS — L97222 Non-pressure chronic ulcer of left calf with fat layer exposed: Secondary | ICD-10-CM | POA: Insufficient documentation

## 2017-08-21 MED ORDER — GADOBENATE DIMEGLUMINE 529 MG/ML IV SOLN
20.0000 mL | Freq: Once | INTRAVENOUS | Status: AC | PRN
  Administered 2017-08-21: 20 mL via INTRAVENOUS

## 2017-08-22 DIAGNOSIS — L928 Other granulomatous disorders of the skin and subcutaneous tissue: Secondary | ICD-10-CM | POA: Diagnosis not present

## 2017-08-22 DIAGNOSIS — I110 Hypertensive heart disease with heart failure: Secondary | ICD-10-CM | POA: Diagnosis not present

## 2017-08-22 DIAGNOSIS — I509 Heart failure, unspecified: Secondary | ICD-10-CM | POA: Diagnosis not present

## 2017-08-22 DIAGNOSIS — L97822 Non-pressure chronic ulcer of other part of left lower leg with fat layer exposed: Secondary | ICD-10-CM | POA: Diagnosis not present

## 2017-08-26 ENCOUNTER — Telehealth: Payer: Self-pay

## 2017-08-26 ENCOUNTER — Other Ambulatory Visit: Payer: Self-pay

## 2017-08-26 MED ORDER — ALPRAZOLAM 0.5 MG PO TABS: ORAL_TABLET | ORAL | 2 refills | Status: DC

## 2017-08-26 MED ORDER — OXYCODONE HCL 15 MG PO TABS: 15.0000 mg | ORAL_TABLET | Freq: Three times a day (TID) | ORAL | 0 refills | Status: DC | PRN

## 2017-08-26 NOTE — Telephone Encounter (Signed)
Pt called asking for a refill on Alprazolam [facility] [address]

## 2017-08-26 NOTE — Telephone Encounter (Signed)
Oxycodone was picked up on 08/05/2017.

## 2017-08-26 NOTE — Addendum Note (Signed)
Addended by: Bayard Hugger on: 08/26/2017 04:28 PM   Modules accepted: Orders

## 2017-08-26 NOTE — Telephone Encounter (Signed)
Alprazolam e-scribe, last prescription picked up on 07/28/2017 per PMP Aware Web-Site.

## 2017-08-26 NOTE — Telephone Encounter (Signed)
Oxycodone e-scribe to accommodate scheduled appointment with Dr Naaman Plummer.

## 2017-08-26 NOTE — Telephone Encounter (Signed)
Pt is asking for refill on Alprazalom last filled 07/28/17, next appt 09/2017

## 2017-09-10 ENCOUNTER — Telehealth: Payer: Self-pay | Admitting: *Deleted

## 2017-09-10 NOTE — Telephone Encounter (Signed)
Patient left a message asking for Zella Ball to refill his script for Lyrica 100 mg capsules TID to [facility].  Last script was #270 (90 day supply) with 2-3 refills.  I contacted [facility] and for federally controlled substance, they must be either E-scribed, Faxed, or Sent by Mail. No phone calls.

## 2017-09-11 MED ORDER — PREGABALIN 150 MG PO CAPS: 150.0000 mg | ORAL_CAPSULE | Freq: Three times a day (TID) | ORAL | 3 refills | Status: DC

## 2017-09-11 NOTE — Telephone Encounter (Signed)
Lyrica  Prescription sent to [facility].

## 2017-09-16 ENCOUNTER — Ambulatory Visit: Payer: Medicare Other | Admitting: Physical Medicine & Rehabilitation

## 2017-09-17 ENCOUNTER — Encounter (HOSPITAL_BASED_OUTPATIENT_CLINIC_OR_DEPARTMENT_OTHER): Payer: Medicare Other | Attending: Internal Medicine

## 2017-09-17 DIAGNOSIS — E7800 Pure hypercholesterolemia, unspecified: Secondary | ICD-10-CM | POA: Diagnosis not present

## 2017-09-17 DIAGNOSIS — I509 Heart failure, unspecified: Secondary | ICD-10-CM | POA: Insufficient documentation

## 2017-09-17 DIAGNOSIS — J45909 Unspecified asthma, uncomplicated: Secondary | ICD-10-CM | POA: Diagnosis not present

## 2017-09-17 DIAGNOSIS — I110 Hypertensive heart disease with heart failure: Secondary | ICD-10-CM | POA: Diagnosis not present

## 2017-09-17 DIAGNOSIS — Z0000 Encounter for general adult medical examination without abnormal findings: Secondary | ICD-10-CM | POA: Diagnosis not present

## 2017-09-17 DIAGNOSIS — L97829 Non-pressure chronic ulcer of other part of left lower leg with unspecified severity: Secondary | ICD-10-CM | POA: Insufficient documentation

## 2017-09-17 DIAGNOSIS — I429 Cardiomyopathy, unspecified: Secondary | ICD-10-CM | POA: Diagnosis not present

## 2017-09-17 DIAGNOSIS — S81802A Unspecified open wound, left lower leg, initial encounter: Secondary | ICD-10-CM | POA: Diagnosis not present

## 2017-09-17 DIAGNOSIS — E291 Testicular hypofunction: Secondary | ICD-10-CM | POA: Diagnosis not present

## 2017-09-17 DIAGNOSIS — G8929 Other chronic pain: Secondary | ICD-10-CM | POA: Diagnosis not present

## 2017-09-17 DIAGNOSIS — Z1159 Encounter for screening for other viral diseases: Secondary | ICD-10-CM | POA: Diagnosis not present

## 2017-09-17 DIAGNOSIS — I119 Hypertensive heart disease without heart failure: Secondary | ICD-10-CM | POA: Diagnosis not present

## 2017-09-17 DIAGNOSIS — Z125 Encounter for screening for malignant neoplasm of prostate: Secondary | ICD-10-CM | POA: Diagnosis not present

## 2017-09-18 ENCOUNTER — Encounter: Payer: Self-pay | Admitting: Physical Medicine & Rehabilitation

## 2017-09-18 ENCOUNTER — Encounter: Payer: Medicare Other | Attending: Physical Medicine and Rehabilitation | Admitting: Physical Medicine & Rehabilitation

## 2017-09-18 VITALS — BP 128/85 | HR 75 | Ht 73.0 in | Wt 289.0 lb

## 2017-09-18 DIAGNOSIS — M1732 Unilateral post-traumatic osteoarthritis, left knee: Secondary | ICD-10-CM | POA: Insufficient documentation

## 2017-09-18 DIAGNOSIS — G894 Chronic pain syndrome: Secondary | ICD-10-CM

## 2017-09-18 DIAGNOSIS — M7522 Bicipital tendinitis, left shoulder: Secondary | ICD-10-CM | POA: Diagnosis not present

## 2017-09-18 DIAGNOSIS — S8411XA Injury of peroneal nerve at lower leg level, right leg, initial encounter: Secondary | ICD-10-CM | POA: Diagnosis not present

## 2017-09-18 DIAGNOSIS — M722 Plantar fascial fibromatosis: Secondary | ICD-10-CM | POA: Insufficient documentation

## 2017-09-18 DIAGNOSIS — S8412XS Injury of peroneal nerve at lower leg level, left leg, sequela: Secondary | ICD-10-CM

## 2017-09-18 MED ORDER — OXYCODONE HCL 15 MG PO TABS: 15.0000 mg | ORAL_TABLET | Freq: Three times a day (TID) | ORAL | 0 refills | Status: DC | PRN

## 2017-09-18 NOTE — Patient Instructions (Signed)
PLEASE FEEL FREE TO CALL OUR OFFICE WITH ANY PROBLEMS OR QUESTIONS ([phone])      
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
## 2017-09-23 ENCOUNTER — Telehealth: Payer: Self-pay | Admitting: Registered Nurse

## 2017-09-23 ENCOUNTER — Encounter: Payer: Medicare Other | Admitting: Physical Medicine & Rehabilitation

## 2017-09-23 DIAGNOSIS — S8412XS Injury of peroneal nerve at lower leg level, left leg, sequela: Secondary | ICD-10-CM

## 2017-09-23 DIAGNOSIS — G894 Chronic pain syndrome: Secondary | ICD-10-CM

## 2017-09-23 MED ORDER — OXYCODONE HCL 15 MG PO TABS: 15.0000 mg | ORAL_TABLET | Freq: Three times a day (TID) | ORAL | 0 refills | Status: DC | PRN

## 2017-09-23 NOTE — Telephone Encounter (Signed)
Medication refill : Oxycodone -e scribe.

## 2017-10-09 ENCOUNTER — Encounter (HOSPITAL_BASED_OUTPATIENT_CLINIC_OR_DEPARTMENT_OTHER): Payer: Self-pay

## 2017-10-09 ENCOUNTER — Encounter (HOSPITAL_BASED_OUTPATIENT_CLINIC_OR_DEPARTMENT_OTHER): Payer: Medicare Other | Attending: Internal Medicine

## 2017-10-09 DIAGNOSIS — I110 Hypertensive heart disease with heart failure: Secondary | ICD-10-CM | POA: Insufficient documentation

## 2017-10-09 DIAGNOSIS — L97822 Non-pressure chronic ulcer of other part of left lower leg with fat layer exposed: Secondary | ICD-10-CM | POA: Diagnosis not present

## 2017-10-09 DIAGNOSIS — L97222 Non-pressure chronic ulcer of left calf with fat layer exposed: Secondary | ICD-10-CM | POA: Diagnosis not present

## 2017-10-09 DIAGNOSIS — I509 Heart failure, unspecified: Secondary | ICD-10-CM | POA: Insufficient documentation

## 2017-11-08 ENCOUNTER — Encounter (HOSPITAL_BASED_OUTPATIENT_CLINIC_OR_DEPARTMENT_OTHER): Payer: Medicare Other | Attending: Internal Medicine

## 2017-11-08 DIAGNOSIS — I110 Hypertensive heart disease with heart failure: Secondary | ICD-10-CM | POA: Insufficient documentation

## 2017-11-08 DIAGNOSIS — L97822 Non-pressure chronic ulcer of other part of left lower leg with fat layer exposed: Secondary | ICD-10-CM | POA: Diagnosis not present

## 2017-11-08 DIAGNOSIS — L928 Other granulomatous disorders of the skin and subcutaneous tissue: Secondary | ICD-10-CM | POA: Diagnosis not present

## 2017-11-08 DIAGNOSIS — I509 Heart failure, unspecified: Secondary | ICD-10-CM | POA: Insufficient documentation

## 2017-11-12 ENCOUNTER — Telehealth: Payer: Self-pay

## 2017-11-12 NOTE — Telephone Encounter (Signed)
Pt called stating that he needs a refill on Alprazolam sent into [facility] [place].

## 2017-11-13 ENCOUNTER — Other Ambulatory Visit: Payer: Self-pay | Admitting: *Deleted

## 2017-11-13 DIAGNOSIS — H4031X3 Glaucoma secondary to eye trauma, right eye, severe stage: Secondary | ICD-10-CM | POA: Diagnosis not present

## 2017-11-13 MED ORDER — ALPRAZOLAM 0.5 MG PO TABS: ORAL_TABLET | ORAL | 2 refills | Status: DC

## 2017-11-13 NOTE — Telephone Encounter (Signed)
Mr. Seese notified, Mancel Parsons ordered.

## 2017-11-19 ENCOUNTER — Ambulatory Visit: Payer: Medicare Other | Admitting: Registered Nurse

## 2017-11-19 ENCOUNTER — Encounter: Payer: Medicare Other | Attending: Physical Medicine and Rehabilitation | Admitting: Registered Nurse

## 2017-11-19 ENCOUNTER — Encounter: Payer: Self-pay | Admitting: Registered Nurse

## 2017-11-19 ENCOUNTER — Ambulatory Visit: Payer: Medicare Other | Admitting: Physical Medicine & Rehabilitation

## 2017-11-19 VITALS — BP 121/85 | HR 78 | Resp 14 | Ht 73.0 in | Wt 289.0 lb

## 2017-11-19 DIAGNOSIS — G894 Chronic pain syndrome: Secondary | ICD-10-CM | POA: Diagnosis not present

## 2017-11-19 DIAGNOSIS — Z5181 Encounter for therapeutic drug level monitoring: Secondary | ICD-10-CM

## 2017-11-19 DIAGNOSIS — Z79891 Long term (current) use of opiate analgesic: Secondary | ICD-10-CM

## 2017-11-19 DIAGNOSIS — S8412XS Injury of peroneal nerve at lower leg level, left leg, sequela: Secondary | ICD-10-CM

## 2017-11-19 DIAGNOSIS — M1732 Unilateral post-traumatic osteoarthritis, left knee: Secondary | ICD-10-CM | POA: Diagnosis not present

## 2017-11-19 DIAGNOSIS — M7522 Bicipital tendinitis, left shoulder: Secondary | ICD-10-CM | POA: Insufficient documentation

## 2017-11-19 DIAGNOSIS — S8411XA Injury of peroneal nerve at lower leg level, right leg, initial encounter: Secondary | ICD-10-CM | POA: Insufficient documentation

## 2017-11-19 DIAGNOSIS — M722 Plantar fascial fibromatosis: Secondary | ICD-10-CM | POA: Diagnosis not present

## 2017-11-19 MED ORDER — OXYCODONE HCL 15 MG PO TABS: 15.0000 mg | ORAL_TABLET | Freq: Three times a day (TID) | ORAL | 0 refills | Status: DC | PRN
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
## 2017-11-22 DIAGNOSIS — I509 Heart failure, unspecified: Secondary | ICD-10-CM | POA: Diagnosis not present

## 2017-11-22 DIAGNOSIS — S81802A Unspecified open wound, left lower leg, initial encounter: Secondary | ICD-10-CM | POA: Diagnosis not present

## 2017-11-22 DIAGNOSIS — L97822 Non-pressure chronic ulcer of other part of left lower leg with fat layer exposed: Secondary | ICD-10-CM | POA: Diagnosis not present

## 2017-11-22 DIAGNOSIS — I110 Hypertensive heart disease with heart failure: Secondary | ICD-10-CM | POA: Diagnosis not present

## 2017-11-24 LAB — TOXASSURE SELECT,+ANTIDEPR,UR

## 2017-11-25 ENCOUNTER — Telehealth: Payer: Self-pay | Admitting: *Deleted

## 2017-11-25 NOTE — Telephone Encounter (Signed)
Urine drug screen for this encounter is consistent for prescribed medication 

## 2017-12-02 ENCOUNTER — Other Ambulatory Visit: Payer: Self-pay | Admitting: *Deleted

## 2017-12-02 MED ORDER — PREGABALIN 150 MG PO CAPS: 150.0000 mg | ORAL_CAPSULE | Freq: Three times a day (TID) | ORAL | 0 refills | Status: DC

## 2017-12-02 NOTE — Telephone Encounter (Signed)
Patient called asking for refill of his lyrica to be sent to [facility] [address] (30 day supply). Medical recorde reviewed.  RX phoned in.  Patient notified

## 2017-12-07 ENCOUNTER — Other Ambulatory Visit: Payer: Self-pay | Admitting: Registered Nurse

## 2017-12-09 ENCOUNTER — Other Ambulatory Visit: Payer: Self-pay | Admitting: *Deleted

## 2017-12-09 ENCOUNTER — Other Ambulatory Visit: Payer: Self-pay | Admitting: Interventional Cardiology

## 2017-12-09 MED ORDER — LIDOCAINE 5 % EX OINT: 1.0000 "application " | TOPICAL_OINTMENT | Freq: Three times a day (TID) | CUTANEOUS | 5 refills | Status: DC | PRN

## 2017-12-09 MED ORDER — CARVEDILOL 12.5 MG PO TABS: 12.5000 mg | ORAL_TABLET | Freq: Two times a day (BID) | ORAL | 1 refills | Status: DC

## 2017-12-10 ENCOUNTER — Other Ambulatory Visit: Payer: Self-pay | Admitting: *Deleted

## 2017-12-13 ENCOUNTER — Encounter (HOSPITAL_BASED_OUTPATIENT_CLINIC_OR_DEPARTMENT_OTHER): Payer: Medicare Other | Attending: Internal Medicine

## 2017-12-13 DIAGNOSIS — I509 Heart failure, unspecified: Secondary | ICD-10-CM | POA: Insufficient documentation

## 2017-12-13 DIAGNOSIS — L97822 Non-pressure chronic ulcer of other part of left lower leg with fat layer exposed: Secondary | ICD-10-CM | POA: Insufficient documentation

## 2017-12-19 DIAGNOSIS — I509 Heart failure, unspecified: Secondary | ICD-10-CM | POA: Diagnosis not present

## 2017-12-19 DIAGNOSIS — L97822 Non-pressure chronic ulcer of other part of left lower leg with fat layer exposed: Secondary | ICD-10-CM | POA: Diagnosis not present

## 2018-01-09 ENCOUNTER — Other Ambulatory Visit: Payer: Self-pay | Admitting: Physical Medicine & Rehabilitation

## 2018-01-20 ENCOUNTER — Encounter (HOSPITAL_BASED_OUTPATIENT_CLINIC_OR_DEPARTMENT_OTHER): Payer: Medicare Other | Attending: Internal Medicine

## 2018-01-20 ENCOUNTER — Other Ambulatory Visit (HOSPITAL_COMMUNITY)
Admission: RE | Admit: 2018-01-20 | Discharge: 2018-01-20 | Disposition: A | Discharge status: Home / Self Care | Payer: Medicare Other | Source: Other Acute Inpatient Hospital | Attending: Internal Medicine | Admitting: Internal Medicine

## 2018-01-20 ENCOUNTER — Other Ambulatory Visit (HOSPITAL_BASED_OUTPATIENT_CLINIC_OR_DEPARTMENT_OTHER): Payer: Self-pay | Admitting: Internal Medicine

## 2018-01-20 DIAGNOSIS — I509 Heart failure, unspecified: Secondary | ICD-10-CM | POA: Diagnosis not present

## 2018-01-20 DIAGNOSIS — L97826 Non-pressure chronic ulcer of other part of left lower leg with bone involvement without evidence of necrosis: Secondary | ICD-10-CM | POA: Diagnosis not present

## 2018-01-20 DIAGNOSIS — M8618 Other acute osteomyelitis, other site: Secondary | ICD-10-CM | POA: Diagnosis not present

## 2018-01-20 DIAGNOSIS — S81802A Unspecified open wound, left lower leg, initial encounter: Secondary | ICD-10-CM | POA: Diagnosis not present

## 2018-01-20 DIAGNOSIS — I110 Hypertensive heart disease with heart failure: Secondary | ICD-10-CM | POA: Diagnosis not present

## 2018-01-20 DIAGNOSIS — L97222 Non-pressure chronic ulcer of left calf with fat layer exposed: Secondary | ICD-10-CM | POA: Diagnosis not present

## 2018-01-21 ENCOUNTER — Encounter: Payer: Medicare Other | Attending: Physical Medicine and Rehabilitation | Admitting: Registered Nurse

## 2018-01-21 ENCOUNTER — Encounter: Payer: Self-pay | Admitting: Registered Nurse

## 2018-01-21 VITALS — BP 124/85 | HR 74 | Ht 73.0 in | Wt 291.0 lb

## 2018-01-21 DIAGNOSIS — Z5181 Encounter for therapeutic drug level monitoring: Secondary | ICD-10-CM

## 2018-01-21 DIAGNOSIS — S8412XS Injury of peroneal nerve at lower leg level, left leg, sequela: Secondary | ICD-10-CM

## 2018-01-21 DIAGNOSIS — F411 Generalized anxiety disorder: Secondary | ICD-10-CM | POA: Diagnosis not present

## 2018-01-21 DIAGNOSIS — M722 Plantar fascial fibromatosis: Secondary | ICD-10-CM | POA: Insufficient documentation

## 2018-01-21 DIAGNOSIS — Z79891 Long term (current) use of opiate analgesic: Secondary | ICD-10-CM

## 2018-01-21 DIAGNOSIS — M1732 Unilateral post-traumatic osteoarthritis, left knee: Secondary | ICD-10-CM | POA: Diagnosis not present

## 2018-01-21 DIAGNOSIS — S8411XA Injury of peroneal nerve at lower leg level, right leg, initial encounter: Secondary | ICD-10-CM | POA: Insufficient documentation

## 2018-01-21 DIAGNOSIS — G894 Chronic pain syndrome: Secondary | ICD-10-CM

## 2018-01-21 DIAGNOSIS — M7522 Bicipital tendinitis, left shoulder: Secondary | ICD-10-CM | POA: Insufficient documentation

## 2018-01-21 MED ORDER — OXYCODONE HCL 15 MG PO TABS: 15.0000 mg | ORAL_TABLET | Freq: Three times a day (TID) | ORAL | 0 refills | Status: DC | PRN

## 2018-01-21 MED ORDER — ALPRAZOLAM 0.5 MG PO TABS: ORAL_TABLET | ORAL | 2 refills | Status: DC
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
## 2018-01-25 LAB — AEROBIC/ANAEROBIC CULTURE W GRAM STAIN (SURGICAL/DEEP WOUND)

## 2018-01-28 LAB — TOXASSURE SELECT,+ANTIDEPR,UR

## 2018-02-03 DIAGNOSIS — L97826 Non-pressure chronic ulcer of other part of left lower leg with bone involvement without evidence of necrosis: Secondary | ICD-10-CM | POA: Diagnosis not present

## 2018-02-03 DIAGNOSIS — I509 Heart failure, unspecified: Secondary | ICD-10-CM | POA: Diagnosis not present

## 2018-02-03 DIAGNOSIS — I110 Hypertensive heart disease with heart failure: Secondary | ICD-10-CM | POA: Diagnosis not present

## 2018-02-04 ENCOUNTER — Telehealth: Payer: Self-pay | Admitting: *Deleted

## 2018-02-04 NOTE — Telephone Encounter (Signed)
Urine drug screen is consistent for oxycodone. He is prescribed alprazolam monthly but there is no presence of parent drug or metabolite in his drug screen(s).

## 2018-02-13 ENCOUNTER — Other Ambulatory Visit: Payer: Self-pay | Admitting: Physical Medicine & Rehabilitation

## 2018-02-24 ENCOUNTER — Encounter (HOSPITAL_BASED_OUTPATIENT_CLINIC_OR_DEPARTMENT_OTHER): Payer: Medicare Other | Attending: Internal Medicine

## 2018-02-24 DIAGNOSIS — M86362 Chronic multifocal osteomyelitis, left tibia and fibula: Secondary | ICD-10-CM | POA: Diagnosis not present

## 2018-02-24 DIAGNOSIS — M86462 Chronic osteomyelitis with draining sinus, left tibia and fibula: Secondary | ICD-10-CM | POA: Diagnosis not present

## 2018-02-24 DIAGNOSIS — I10 Essential (primary) hypertension: Secondary | ICD-10-CM | POA: Insufficient documentation

## 2018-02-24 DIAGNOSIS — L97826 Non-pressure chronic ulcer of other part of left lower leg with bone involvement without evidence of necrosis: Secondary | ICD-10-CM | POA: Diagnosis not present

## 2018-03-20 ENCOUNTER — Encounter (HOSPITAL_BASED_OUTPATIENT_CLINIC_OR_DEPARTMENT_OTHER): Payer: Medicare Other | Attending: Internal Medicine

## 2018-03-20 DIAGNOSIS — L97826 Non-pressure chronic ulcer of other part of left lower leg with bone involvement without evidence of necrosis: Secondary | ICD-10-CM | POA: Insufficient documentation

## 2018-03-20 DIAGNOSIS — I872 Venous insufficiency (chronic) (peripheral): Secondary | ICD-10-CM | POA: Diagnosis not present

## 2018-03-20 DIAGNOSIS — I10 Essential (primary) hypertension: Secondary | ICD-10-CM | POA: Diagnosis not present

## 2018-03-20 DIAGNOSIS — L97822 Non-pressure chronic ulcer of other part of left lower leg with fat layer exposed: Secondary | ICD-10-CM | POA: Diagnosis not present

## 2018-03-24 DIAGNOSIS — H4031X3 Glaucoma secondary to eye trauma, right eye, severe stage: Secondary | ICD-10-CM | POA: Diagnosis not present

## 2018-03-25 ENCOUNTER — Ambulatory Visit
Admission: RE | Admit: 2018-03-25 | Discharge: 2018-03-25 | Disposition: A | Discharge status: Home / Self Care | Payer: Medicare Other | Source: Ambulatory Visit | Attending: Physical Medicine & Rehabilitation | Admitting: Physical Medicine & Rehabilitation

## 2018-03-25 ENCOUNTER — Encounter: Payer: Medicare Other | Attending: Physical Medicine and Rehabilitation | Admitting: Physical Medicine & Rehabilitation

## 2018-03-25 ENCOUNTER — Encounter: Payer: Self-pay | Admitting: Physical Medicine & Rehabilitation

## 2018-03-25 ENCOUNTER — Telehealth: Payer: Self-pay | Admitting: Registered Nurse

## 2018-03-25 VITALS — BP 117/76 | HR 81 | Ht 73.0 in | Wt 291.0 lb

## 2018-03-25 DIAGNOSIS — M722 Plantar fascial fibromatosis: Secondary | ICD-10-CM | POA: Insufficient documentation

## 2018-03-25 DIAGNOSIS — S8411XA Injury of peroneal nerve at lower leg level, right leg, initial encounter: Secondary | ICD-10-CM | POA: Diagnosis not present

## 2018-03-25 DIAGNOSIS — M7522 Bicipital tendinitis, left shoulder: Secondary | ICD-10-CM | POA: Insufficient documentation

## 2018-03-25 DIAGNOSIS — M7731 Calcaneal spur, right foot: Secondary | ICD-10-CM | POA: Diagnosis not present

## 2018-03-25 DIAGNOSIS — S8412XS Injury of peroneal nerve at lower leg level, left leg, sequela: Secondary | ICD-10-CM

## 2018-03-25 DIAGNOSIS — G894 Chronic pain syndrome: Secondary | ICD-10-CM | POA: Diagnosis not present

## 2018-03-25 DIAGNOSIS — M1732 Unilateral post-traumatic osteoarthritis, left knee: Secondary | ICD-10-CM | POA: Insufficient documentation

## 2018-03-25 MED ORDER — OXYCODONE HCL 15 MG PO TABS: 15.0000 mg | ORAL_TABLET | Freq: Three times a day (TID) | ORAL | 0 refills | Status: DC | PRN

## 2018-03-25 MED ORDER — PREGABALIN 150 MG PO CAPS: 150.0000 mg | ORAL_CAPSULE | Freq: Three times a day (TID) | ORAL | 3 refills | Status: DC

## 2018-03-25 NOTE — Patient Instructions (Signed)
LEFT HEEL:  1. HEAT, STRETCHING IN MORNING  2. BETTER HEEL CUSHION/THICKER  3. ICE FOR PAIN LATER IN THE DAY.

## 2018-03-25 NOTE — Telephone Encounter (Signed)
PMP Aware Website was reviewed: Last Oxycodone was filled on 02/22/18.  Oxycodone and Lyrica sent. Second script of Oxycodone sent for the following month.  Mr. Almanza is aware of the above.
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
## 2018-04-21 ENCOUNTER — Other Ambulatory Visit: Payer: Self-pay | Admitting: Interventional Cardiology

## 2018-04-23 DIAGNOSIS — E291 Testicular hypofunction: Secondary | ICD-10-CM | POA: Diagnosis not present

## 2018-04-23 DIAGNOSIS — Z23 Encounter for immunization: Secondary | ICD-10-CM | POA: Diagnosis not present

## 2018-04-23 DIAGNOSIS — J45909 Unspecified asthma, uncomplicated: Secondary | ICD-10-CM | POA: Diagnosis not present

## 2018-04-23 DIAGNOSIS — Z6839 Body mass index (BMI) 39.0-39.9, adult: Secondary | ICD-10-CM | POA: Diagnosis not present

## 2018-04-23 DIAGNOSIS — N529 Male erectile dysfunction, unspecified: Secondary | ICD-10-CM | POA: Diagnosis not present

## 2018-04-23 DIAGNOSIS — I119 Hypertensive heart disease without heart failure: Secondary | ICD-10-CM | POA: Diagnosis not present

## 2018-04-24 ENCOUNTER — Encounter (HOSPITAL_BASED_OUTPATIENT_CLINIC_OR_DEPARTMENT_OTHER): Payer: Medicare Other | Attending: Internal Medicine

## 2018-04-24 DIAGNOSIS — L97822 Non-pressure chronic ulcer of other part of left lower leg with fat layer exposed: Secondary | ICD-10-CM | POA: Diagnosis not present

## 2018-04-24 DIAGNOSIS — L97829 Non-pressure chronic ulcer of other part of left lower leg with unspecified severity: Secondary | ICD-10-CM | POA: Diagnosis not present

## 2018-04-24 DIAGNOSIS — I10 Essential (primary) hypertension: Secondary | ICD-10-CM | POA: Diagnosis not present

## 2018-04-24 DIAGNOSIS — B9561 Methicillin susceptible Staphylococcus aureus infection as the cause of diseases classified elsewhere: Secondary | ICD-10-CM | POA: Diagnosis not present

## 2018-05-16 ENCOUNTER — Other Ambulatory Visit: Payer: Self-pay | Admitting: Physical Medicine & Rehabilitation

## 2018-05-16 ENCOUNTER — Other Ambulatory Visit: Payer: Self-pay | Admitting: Interventional Cardiology

## 2018-05-21 ENCOUNTER — Encounter: Payer: Medicare Other | Attending: Physical Medicine and Rehabilitation | Admitting: Registered Nurse

## 2018-05-21 ENCOUNTER — Encounter: Payer: Self-pay | Admitting: Registered Nurse

## 2018-05-21 ENCOUNTER — Encounter (HOSPITAL_BASED_OUTPATIENT_CLINIC_OR_DEPARTMENT_OTHER): Payer: Medicare Other | Attending: Internal Medicine

## 2018-05-21 VITALS — BP 119/81 | HR 75 | Ht 73.0 in | Wt 291.0 lb

## 2018-05-21 DIAGNOSIS — M722 Plantar fascial fibromatosis: Secondary | ICD-10-CM | POA: Insufficient documentation

## 2018-05-21 DIAGNOSIS — Z5181 Encounter for therapeutic drug level monitoring: Secondary | ICD-10-CM

## 2018-05-21 DIAGNOSIS — F411 Generalized anxiety disorder: Secondary | ICD-10-CM

## 2018-05-21 DIAGNOSIS — M1732 Unilateral post-traumatic osteoarthritis, left knee: Secondary | ICD-10-CM

## 2018-05-21 DIAGNOSIS — G894 Chronic pain syndrome: Secondary | ICD-10-CM | POA: Diagnosis not present

## 2018-05-21 DIAGNOSIS — M7522 Bicipital tendinitis, left shoulder: Secondary | ICD-10-CM | POA: Insufficient documentation

## 2018-05-21 DIAGNOSIS — S8412XS Injury of peroneal nerve at lower leg level, left leg, sequela: Secondary | ICD-10-CM | POA: Diagnosis not present

## 2018-05-21 DIAGNOSIS — I10 Essential (primary) hypertension: Secondary | ICD-10-CM | POA: Insufficient documentation

## 2018-05-21 DIAGNOSIS — L97822 Non-pressure chronic ulcer of other part of left lower leg with fat layer exposed: Secondary | ICD-10-CM | POA: Insufficient documentation

## 2018-05-21 DIAGNOSIS — S8411XA Injury of peroneal nerve at lower leg level, right leg, initial encounter: Secondary | ICD-10-CM | POA: Insufficient documentation

## 2018-05-21 DIAGNOSIS — Z79891 Long term (current) use of opiate analgesic: Secondary | ICD-10-CM

## 2018-05-21 MED ORDER — PREGABALIN 150 MG PO CAPS: 150.0000 mg | ORAL_CAPSULE | Freq: Three times a day (TID) | ORAL | 3 refills | Status: DC

## 2018-05-21 MED ORDER — OXYCODONE HCL 15 MG PO TABS: 15.0000 mg | ORAL_TABLET | Freq: Three times a day (TID) | ORAL | 0 refills | Status: DC | PRN
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
## 2018-05-26 ENCOUNTER — Ambulatory Visit: Payer: Medicare Other | Admitting: Registered Nurse

## 2018-06-20 ENCOUNTER — Encounter (HOSPITAL_BASED_OUTPATIENT_CLINIC_OR_DEPARTMENT_OTHER): Payer: Self-pay

## 2018-06-20 ENCOUNTER — Encounter (HOSPITAL_BASED_OUTPATIENT_CLINIC_OR_DEPARTMENT_OTHER): Payer: Medicare Other | Attending: Internal Medicine

## 2018-06-20 DIAGNOSIS — I110 Hypertensive heart disease with heart failure: Secondary | ICD-10-CM | POA: Insufficient documentation

## 2018-06-20 DIAGNOSIS — I509 Heart failure, unspecified: Secondary | ICD-10-CM | POA: Insufficient documentation

## 2018-06-20 DIAGNOSIS — L97822 Non-pressure chronic ulcer of other part of left lower leg with fat layer exposed: Secondary | ICD-10-CM | POA: Insufficient documentation

## 2018-06-23 ENCOUNTER — Other Ambulatory Visit (HOSPITAL_COMMUNITY)
Admission: RE | Admit: 2018-06-23 | Discharge: 2018-06-23 | Disposition: A | Discharge status: Home / Self Care | Payer: Medicare Other | Source: Other Acute Inpatient Hospital | Attending: Internal Medicine | Admitting: Internal Medicine

## 2018-06-23 DIAGNOSIS — L97222 Non-pressure chronic ulcer of left calf with fat layer exposed: Secondary | ICD-10-CM | POA: Insufficient documentation

## 2018-06-23 DIAGNOSIS — L97822 Non-pressure chronic ulcer of other part of left lower leg with fat layer exposed: Secondary | ICD-10-CM | POA: Diagnosis not present

## 2018-06-23 DIAGNOSIS — I509 Heart failure, unspecified: Secondary | ICD-10-CM | POA: Diagnosis not present

## 2018-06-23 DIAGNOSIS — I110 Hypertensive heart disease with heart failure: Secondary | ICD-10-CM | POA: Diagnosis not present

## 2018-06-27 ENCOUNTER — Encounter: Payer: Self-pay | Admitting: Interventional Cardiology

## 2018-06-27 ENCOUNTER — Ambulatory Visit (INDEPENDENT_AMBULATORY_CARE_PROVIDER_SITE_OTHER): Payer: Medicare Other | Admitting: Interventional Cardiology

## 2018-06-27 VITALS — BP 122/90 | HR 86 | Ht 73.0 in | Wt 291.8 lb

## 2018-06-27 DIAGNOSIS — E782 Mixed hyperlipidemia: Secondary | ICD-10-CM

## 2018-06-27 DIAGNOSIS — E669 Obesity, unspecified: Secondary | ICD-10-CM | POA: Diagnosis not present

## 2018-06-27 DIAGNOSIS — I428 Other cardiomyopathies: Secondary | ICD-10-CM | POA: Diagnosis not present

## 2018-06-27 DIAGNOSIS — R600 Localized edema: Secondary | ICD-10-CM | POA: Diagnosis not present

## 2018-06-27 LAB — AEROBIC CULTURE W GRAM STAIN (SUPERFICIAL SPECIMEN): Gram Stain: NONE SEEN

## 2018-06-27 LAB — AEROBIC CULTURE  (SUPERFICIAL SPECIMEN): CULTURE: NO GROWTH

## 2018-06-27 MED ORDER — ASPIRIN EC 81 MG PO TBEC: 81.0000 mg | DELAYED_RELEASE_TABLET | Freq: Every day | ORAL | 3 refills | Status: DC
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
## 2018-06-27 NOTE — Patient Instructions (Signed)
Medication Instructions:  Your physician has recommended you make the following change in your medication:   1. DECREASE: Aspirin to 81 mg once a day  2. STOP: meloxicam   Lab work: None Ordered  If you have labs (blood work) drawn today and your tests are completely normal, you will receive your results only by: Marland Kitchen MyChart Message (if you have MyChart) OR . A paper copy in the mail If you have any lab test that is abnormal or we need to change your treatment, we will call you to review the results.  Testing/Procedures: None ordered  Follow-Up: At [facility], you and your health needs are our priority.  As part of our continuing mission to provide you with exceptional heart care, we have created designated Provider Care Teams.  These Care Teams include your primary Cardiologist (physician) and Advanced Practice Providers (APPs -  Physician Assistants and Nurse Practitioners) who all work together to provide you with the care you need, when you need it. . You will need a follow up appointment in 1 year.  Please call our office 2 months in advance to schedule this appointment.  You may see Casandra Doffing, MD or one of the following Advanced Practice Providers on your designated Care Team:   . Lyda Jester, PA-C . Dayna Dunn, PA-C . Ermalinda Barrios, PA-C  Any Other Special Instructions Will Be Listed Below (If Applicable).

## 2018-07-10 ENCOUNTER — Other Ambulatory Visit: Payer: Self-pay | Admitting: Interventional Cardiology

## 2018-07-16 ENCOUNTER — Encounter: Payer: Medicare Other | Attending: Physical Medicine and Rehabilitation | Admitting: Registered Nurse

## 2018-07-16 ENCOUNTER — Encounter: Payer: Self-pay | Admitting: Registered Nurse

## 2018-07-16 VITALS — BP 117/83 | HR 84

## 2018-07-16 DIAGNOSIS — M7522 Bicipital tendinitis, left shoulder: Secondary | ICD-10-CM | POA: Diagnosis not present

## 2018-07-16 DIAGNOSIS — M1732 Unilateral post-traumatic osteoarthritis, left knee: Secondary | ICD-10-CM

## 2018-07-16 DIAGNOSIS — Z5181 Encounter for therapeutic drug level monitoring: Secondary | ICD-10-CM | POA: Diagnosis not present

## 2018-07-16 DIAGNOSIS — G894 Chronic pain syndrome: Secondary | ICD-10-CM | POA: Diagnosis not present

## 2018-07-16 DIAGNOSIS — S8411XA Injury of peroneal nerve at lower leg level, right leg, initial encounter: Secondary | ICD-10-CM | POA: Diagnosis not present

## 2018-07-16 DIAGNOSIS — F411 Generalized anxiety disorder: Secondary | ICD-10-CM

## 2018-07-16 DIAGNOSIS — S8412XS Injury of peroneal nerve at lower leg level, left leg, sequela: Secondary | ICD-10-CM | POA: Diagnosis not present

## 2018-07-16 DIAGNOSIS — Z79891 Long term (current) use of opiate analgesic: Secondary | ICD-10-CM

## 2018-07-16 DIAGNOSIS — M722 Plantar fascial fibromatosis: Secondary | ICD-10-CM | POA: Diagnosis not present

## 2018-07-16 MED ORDER — OXYCODONE HCL 15 MG PO TABS: 15.0000 mg | ORAL_TABLET | Freq: Three times a day (TID) | ORAL | 0 refills | Status: DC | PRN

## 2018-07-16 MED ORDER — ALPRAZOLAM 0.5 MG PO TABS: ORAL_TABLET | ORAL | 2 refills | Status: DC
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
## 2018-07-22 ENCOUNTER — Encounter (HOSPITAL_BASED_OUTPATIENT_CLINIC_OR_DEPARTMENT_OTHER): Payer: Medicare Other | Attending: Internal Medicine

## 2018-07-23 LAB — TOXASSURE SELECT,+ANTIDEPR,UR

## 2018-07-31 ENCOUNTER — Other Ambulatory Visit: Payer: Self-pay | Admitting: Interventional Cardiology

## 2018-08-05 MED ORDER — CARVEDILOL 12.5 MG PO TABS: 12.5000 mg | ORAL_TABLET | Freq: Two times a day (BID) | ORAL | 3 refills | Status: DC

## 2018-08-05 NOTE — Addendum Note (Signed)
Addended by: Derl Barrow on: 08/05/2018 03:12 PM   Modules accepted: Orders

## 2018-08-05 NOTE — Telephone Encounter (Signed)
Pt's medication was sent to pt's pharmacy as requested. Confirmation received.  °

## 2018-08-06 ENCOUNTER — Telehealth: Payer: Self-pay | Admitting: *Deleted

## 2018-08-06 MED ORDER — PREGABALIN 150 MG PO CAPS: 150.0000 mg | ORAL_CAPSULE | Freq: Three times a day (TID) | ORAL | 3 refills | Status: DC

## 2018-08-06 NOTE — Telephone Encounter (Signed)
Patient left a message asking for refills on his lyrica.  He would like future Lyrica scripts sent [facility].  This pharmacy is on his list

## 2018-08-06 NOTE — Telephone Encounter (Signed)
PMP was reviewed. Lyrica was filled on 07/28/2018. Lyrica will be e-scribed to [facility].. Placed a call to Victor [name] no answer. He sated on his voicemail do not leave a message, they are not checked.

## 2018-09-02 ENCOUNTER — Other Ambulatory Visit: Payer: Self-pay

## 2018-09-02 ENCOUNTER — Encounter: Payer: Medicare Other | Attending: Physical Medicine and Rehabilitation | Admitting: Registered Nurse

## 2018-09-02 ENCOUNTER — Encounter: Payer: Self-pay | Admitting: Registered Nurse

## 2018-09-02 VITALS — BP 116/83 | HR 86 | Resp 16 | Ht 73.0 in | Wt 286.0 lb

## 2018-09-02 DIAGNOSIS — M722 Plantar fascial fibromatosis: Secondary | ICD-10-CM | POA: Diagnosis not present

## 2018-09-02 DIAGNOSIS — Z5181 Encounter for therapeutic drug level monitoring: Secondary | ICD-10-CM

## 2018-09-02 DIAGNOSIS — Z79891 Long term (current) use of opiate analgesic: Secondary | ICD-10-CM | POA: Diagnosis not present

## 2018-09-02 DIAGNOSIS — G894 Chronic pain syndrome: Secondary | ICD-10-CM

## 2018-09-02 DIAGNOSIS — S8412XS Injury of peroneal nerve at lower leg level, left leg, sequela: Secondary | ICD-10-CM

## 2018-09-02 DIAGNOSIS — H4031X3 Glaucoma secondary to eye trauma, right eye, severe stage: Secondary | ICD-10-CM | POA: Diagnosis not present

## 2018-09-02 DIAGNOSIS — S8411XA Injury of peroneal nerve at lower leg level, right leg, initial encounter: Secondary | ICD-10-CM | POA: Diagnosis not present

## 2018-09-02 DIAGNOSIS — F411 Generalized anxiety disorder: Secondary | ICD-10-CM | POA: Diagnosis not present

## 2018-09-02 DIAGNOSIS — M7522 Bicipital tendinitis, left shoulder: Secondary | ICD-10-CM | POA: Insufficient documentation

## 2018-09-02 DIAGNOSIS — M1732 Unilateral post-traumatic osteoarthritis, left knee: Secondary | ICD-10-CM | POA: Insufficient documentation

## 2018-09-02 DIAGNOSIS — H2000 Unspecified acute and subacute iridocyclitis: Secondary | ICD-10-CM | POA: Diagnosis not present

## 2018-09-02 DIAGNOSIS — H35351 Cystoid macular degeneration, right eye: Secondary | ICD-10-CM | POA: Diagnosis not present

## 2018-09-02 DIAGNOSIS — H2512 Age-related nuclear cataract, left eye: Secondary | ICD-10-CM | POA: Diagnosis not present

## 2018-09-02 MED ORDER — OXYCODONE HCL 15 MG PO TABS: 15.0000 mg | ORAL_TABLET | Freq: Three times a day (TID) | ORAL | 0 refills | Status: DC | PRN
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
## 2018-09-05 ENCOUNTER — Ambulatory Visit: Payer: Medicare Other | Admitting: Registered Nurse

## 2018-09-07 LAB — DRUG TOX MONITOR 1 W/CONF, ORAL FLD
Amphetamines: NEGATIVE ng/mL (ref <10)
Barbiturates: NEGATIVE ng/mL (ref <10)
Benzodiazepines: NEGATIVE ng/mL (ref <0.50)
Buprenorphine: NEGATIVE ng/mL (ref <0.10)
Cocaine: NEGATIVE ng/mL (ref <5.0)
Codeine: NEGATIVE ng/mL (ref <2.5)
Dihydrocodeine: NEGATIVE ng/mL (ref <2.5)
Fentanyl: NEGATIVE ng/mL (ref <0.10)
Heroin Metabolite: NEGATIVE ng/mL (ref <1.0)
Hydrocodone: NEGATIVE ng/mL (ref <2.5)
Hydromorphone: NEGATIVE ng/mL (ref <2.5)
MARIJUANA: NEGATIVE ng/mL (ref <2.5)
MDMA: NEGATIVE ng/mL (ref <10)
Meprobamate: NEGATIVE ng/mL (ref <2.5)
Methadone: NEGATIVE ng/mL (ref <5.0)
Morphine: NEGATIVE ng/mL (ref <2.5)
Nicotine Metabolite: NEGATIVE ng/mL (ref <5.0)
Norhydrocodone: NEGATIVE ng/mL (ref <2.5)
Noroxycodone: NEGATIVE ng/mL (ref <2.5)
Opiates: POSITIVE ng/mL — AB (ref <2.5)
Oxycodone: 20.6 ng/mL — ABNORMAL HIGH (ref <2.5)
Oxymorphone: NEGATIVE ng/mL (ref <2.5)
Phencyclidine: NEGATIVE ng/mL (ref <10)
Tapentadol: NEGATIVE ng/mL (ref <5.0)
Tramadol: NEGATIVE ng/mL (ref <5.0)
Zolpidem: NEGATIVE ng/mL (ref <5.0)

## 2018-09-07 LAB — DRUG TOX ALC METAB W/CON, ORAL FLD: Alcohol Metabolite: NEGATIVE ng/mL (ref <25)

## 2018-09-08 ENCOUNTER — Telehealth: Payer: Self-pay | Admitting: Registered Nurse

## 2018-09-08 NOTE — Telephone Encounter (Signed)
Alprazolam discontinued, Mr. Altier states he's taking the alprazolam. UDS inconsistent. Alprazolam discontinued.

## 2018-09-09 ENCOUNTER — Ambulatory Visit: Payer: Medicare Other | Admitting: Registered Nurse

## 2018-09-12 ENCOUNTER — Telehealth: Payer: Self-pay | Admitting: *Deleted

## 2018-09-12 NOTE — Telephone Encounter (Signed)
Oral swab is inconsistent because it is negative for alprazolam though Victor [name] reports he took the medication the night before. He has had multiple UDS (urine and swabs) that have been appropriate for his opiate but never positive for alprazolam.

## 2018-09-30 ENCOUNTER — Other Ambulatory Visit: Payer: Self-pay | Admitting: Interventional Cardiology

## 2018-09-30 DIAGNOSIS — H35351 Cystoid macular degeneration, right eye: Secondary | ICD-10-CM | POA: Diagnosis not present

## 2018-09-30 DIAGNOSIS — H2000 Unspecified acute and subacute iridocyclitis: Secondary | ICD-10-CM | POA: Diagnosis not present

## 2018-10-13 ENCOUNTER — Other Ambulatory Visit: Payer: Self-pay | Admitting: Interventional Cardiology

## 2018-10-31 ENCOUNTER — Encounter: Payer: Self-pay | Admitting: Interventional Cardiology

## 2018-10-31 DIAGNOSIS — Z0000 Encounter for general adult medical examination without abnormal findings: Secondary | ICD-10-CM | POA: Diagnosis not present

## 2018-10-31 DIAGNOSIS — I119 Hypertensive heart disease without heart failure: Secondary | ICD-10-CM | POA: Diagnosis not present

## 2018-10-31 DIAGNOSIS — H409 Unspecified glaucoma: Secondary | ICD-10-CM | POA: Diagnosis not present

## 2018-10-31 DIAGNOSIS — G8929 Other chronic pain: Secondary | ICD-10-CM | POA: Diagnosis not present

## 2018-10-31 DIAGNOSIS — Z125 Encounter for screening for malignant neoplasm of prostate: Secondary | ICD-10-CM | POA: Diagnosis not present

## 2018-10-31 DIAGNOSIS — N529 Male erectile dysfunction, unspecified: Secondary | ICD-10-CM | POA: Diagnosis not present

## 2018-10-31 DIAGNOSIS — E7800 Pure hypercholesterolemia, unspecified: Secondary | ICD-10-CM | POA: Diagnosis not present

## 2018-10-31 DIAGNOSIS — J45909 Unspecified asthma, uncomplicated: Secondary | ICD-10-CM | POA: Diagnosis not present

## 2018-10-31 DIAGNOSIS — E291 Testicular hypofunction: Secondary | ICD-10-CM | POA: Diagnosis not present

## 2018-10-31 DIAGNOSIS — F419 Anxiety disorder, unspecified: Secondary | ICD-10-CM | POA: Diagnosis not present

## 2018-10-31 DIAGNOSIS — I429 Cardiomyopathy, unspecified: Secondary | ICD-10-CM | POA: Diagnosis not present

## 2018-11-03 ENCOUNTER — Encounter: Payer: Medicare Other | Attending: Physical Medicine and Rehabilitation | Admitting: Registered Nurse

## 2018-11-03 ENCOUNTER — Ambulatory Visit: Payer: Medicare Other | Admitting: Registered Nurse

## 2018-11-03 ENCOUNTER — Other Ambulatory Visit: Payer: Self-pay

## 2018-11-03 ENCOUNTER — Encounter: Payer: Self-pay | Admitting: Registered Nurse

## 2018-11-03 VITALS — BP 120/84 | HR 88 | Temp 97.6°F | Ht 73.0 in | Wt 287.0 lb

## 2018-11-03 DIAGNOSIS — M1732 Unilateral post-traumatic osteoarthritis, left knee: Secondary | ICD-10-CM | POA: Insufficient documentation

## 2018-11-03 DIAGNOSIS — S8411XA Injury of peroneal nerve at lower leg level, right leg, initial encounter: Secondary | ICD-10-CM | POA: Insufficient documentation

## 2018-11-03 DIAGNOSIS — M7522 Bicipital tendinitis, left shoulder: Secondary | ICD-10-CM | POA: Diagnosis not present

## 2018-11-03 DIAGNOSIS — Z79891 Long term (current) use of opiate analgesic: Secondary | ICD-10-CM | POA: Diagnosis not present

## 2018-11-03 DIAGNOSIS — M722 Plantar fascial fibromatosis: Secondary | ICD-10-CM | POA: Diagnosis not present

## 2018-11-03 DIAGNOSIS — Z5181 Encounter for therapeutic drug level monitoring: Secondary | ICD-10-CM

## 2018-11-03 DIAGNOSIS — S8412XS Injury of peroneal nerve at lower leg level, left leg, sequela: Secondary | ICD-10-CM | POA: Diagnosis not present

## 2018-11-03 DIAGNOSIS — G894 Chronic pain syndrome: Secondary | ICD-10-CM | POA: Diagnosis not present

## 2018-11-03 MED ORDER — OXYCODONE HCL 15 MG PO TABS: 15.0000 mg | ORAL_TABLET | Freq: Three times a day (TID) | ORAL | 0 refills | Status: DC | PRN
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
## 2018-12-04 ENCOUNTER — Telehealth: Payer: Self-pay

## 2018-12-04 DIAGNOSIS — S8412XS Injury of peroneal nerve at lower leg level, left leg, sequela: Secondary | ICD-10-CM

## 2018-12-04 DIAGNOSIS — G894 Chronic pain syndrome: Secondary | ICD-10-CM

## 2018-12-04 NOTE — Telephone Encounter (Signed)
Patient called requesting a early refill of oxycodone.  According to PMP website he last picked up his oxycodone on 11-06-2018.  Note on medication said not to refill before 12-07-2018.  Next appointment with Dr. Naaman Plummer is 12-31-2018.

## 2018-12-05 MED ORDER — OXYCODONE HCL 15 MG PO TABS: 15.0000 mg | ORAL_TABLET | Freq: Three times a day (TID) | ORAL | 0 refills | Status: DC | PRN

## 2018-12-05 NOTE — Telephone Encounter (Signed)
Pharmacy was called, Oxycodone prescript was removed. New Oxycodone prescription e-scribed today. Placed a call to Victor [name] regarding the above, he verbalizes understanding.

## 2018-12-29 ENCOUNTER — Ambulatory Visit (HOSPITAL_COMMUNITY)
Admission: EM | Admit: 2018-12-29 | Discharge: 2018-12-29 | Disposition: A | Discharge status: Home / Self Care | Payer: Medicare Other | Attending: Emergency Medicine | Admitting: Emergency Medicine

## 2018-12-29 ENCOUNTER — Other Ambulatory Visit: Payer: Self-pay | Admitting: Physical Medicine & Rehabilitation

## 2018-12-29 ENCOUNTER — Other Ambulatory Visit: Payer: Self-pay

## 2018-12-29 ENCOUNTER — Ambulatory Visit (INDEPENDENT_AMBULATORY_CARE_PROVIDER_SITE_OTHER): Payer: Medicare Other

## 2018-12-29 ENCOUNTER — Encounter (HOSPITAL_COMMUNITY): Payer: Self-pay | Admitting: Emergency Medicine

## 2018-12-29 DIAGNOSIS — R0602 Shortness of breath: Secondary | ICD-10-CM | POA: Diagnosis not present

## 2018-12-29 DIAGNOSIS — I110 Hypertensive heart disease with heart failure: Secondary | ICD-10-CM | POA: Diagnosis not present

## 2018-12-29 DIAGNOSIS — Z1159 Encounter for screening for other viral diseases: Secondary | ICD-10-CM | POA: Diagnosis not present

## 2018-12-29 DIAGNOSIS — I5022 Chronic systolic (congestive) heart failure: Secondary | ICD-10-CM | POA: Insufficient documentation

## 2018-12-29 DIAGNOSIS — J45909 Unspecified asthma, uncomplicated: Secondary | ICD-10-CM | POA: Insufficient documentation

## 2018-12-29 DIAGNOSIS — Z20828 Contact with and (suspected) exposure to other viral communicable diseases: Secondary | ICD-10-CM | POA: Diagnosis not present

## 2018-12-29 DIAGNOSIS — Z7982 Long term (current) use of aspirin: Secondary | ICD-10-CM | POA: Diagnosis not present

## 2018-12-29 DIAGNOSIS — Z79899 Other long term (current) drug therapy: Secondary | ICD-10-CM | POA: Insufficient documentation

## 2018-12-29 DIAGNOSIS — I428 Other cardiomyopathies: Secondary | ICD-10-CM | POA: Diagnosis not present

## 2018-12-29 DIAGNOSIS — F411 Generalized anxiety disorder: Secondary | ICD-10-CM

## 2018-12-29 DIAGNOSIS — R05 Cough: Secondary | ICD-10-CM | POA: Insufficient documentation

## 2018-12-29 DIAGNOSIS — Z7951 Long term (current) use of inhaled steroids: Secondary | ICD-10-CM | POA: Insufficient documentation

## 2018-12-29 DIAGNOSIS — R0982 Postnasal drip: Secondary | ICD-10-CM | POA: Insufficient documentation

## 2018-12-29 DIAGNOSIS — R0989 Other specified symptoms and signs involving the circulatory and respiratory systems: Secondary | ICD-10-CM | POA: Diagnosis not present

## 2018-12-29 MED ORDER — HYDROXYZINE HCL 50 MG PO TABS: 50.0000 mg | ORAL_TABLET | Freq: Four times a day (QID) | ORAL | 1 refills | Status: DC | PRN

## 2018-12-29 MED ORDER — AEROCHAMBER PLUS MISC: 2 refills | Status: DC
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
## 2018-12-29 NOTE — Discharge Instructions (Addendum)
Try the Vistaril as needed for anxiety.  It will also help you sleep.  He can do 2 puffs from an albuterol inhaler using your spacer every 4-6 hours to see if this helps.  Go immediately to the ER for fevers, shortness of breath that is not resolved with a albuterol, chest pain, pressure, heaviness, or for any other concerns.

## 2018-12-29 NOTE — ED Triage Notes (Signed)
Patient reports 2-3 months ago had an episode of sob over 2-3 nights, this went away.  Patient reports over the past couple of nights has done this again.  Patient reports getting anxious and counting respirations while trying to doze off.    Denies pain.  Reports when speaking, sob feeling goes away "when I take my mind off of it"  Phlegm from chest for about a month.    Used inhaler about 6 times, emergency inhaler.  May have helped once.  Patient did take xanax

## 2018-12-31 ENCOUNTER — Encounter: Payer: Medicare Other | Attending: Physical Medicine and Rehabilitation | Admitting: Physical Medicine & Rehabilitation

## 2018-12-31 ENCOUNTER — Encounter: Payer: Self-pay | Admitting: Physical Medicine & Rehabilitation

## 2018-12-31 ENCOUNTER — Telehealth: Payer: Self-pay | Admitting: Registered Nurse

## 2018-12-31 ENCOUNTER — Other Ambulatory Visit: Payer: Self-pay

## 2018-12-31 VITALS — BP 120/78 | HR 102 | Temp 97.6°F | Ht 73.0 in | Wt 288.0 lb

## 2018-12-31 DIAGNOSIS — G894 Chronic pain syndrome: Secondary | ICD-10-CM

## 2018-12-31 DIAGNOSIS — S8411XA Injury of peroneal nerve at lower leg level, right leg, initial encounter: Secondary | ICD-10-CM | POA: Diagnosis not present

## 2018-12-31 DIAGNOSIS — M7582 Other shoulder lesions, left shoulder: Secondary | ICD-10-CM

## 2018-12-31 DIAGNOSIS — M1732 Unilateral post-traumatic osteoarthritis, left knee: Secondary | ICD-10-CM | POA: Diagnosis not present

## 2018-12-31 DIAGNOSIS — M722 Plantar fascial fibromatosis: Secondary | ICD-10-CM

## 2018-12-31 DIAGNOSIS — M7522 Bicipital tendinitis, left shoulder: Secondary | ICD-10-CM | POA: Insufficient documentation

## 2018-12-31 DIAGNOSIS — F418 Other specified anxiety disorders: Secondary | ICD-10-CM | POA: Diagnosis not present

## 2018-12-31 DIAGNOSIS — S8412XS Injury of peroneal nerve at lower leg level, left leg, sequela: Secondary | ICD-10-CM | POA: Diagnosis not present

## 2018-12-31 LAB — NOVEL CORONAVIRUS, NAA (HOSP ORDER, SEND-OUT TO REF LAB; TAT 18-24 HRS): SARS-CoV-2, NAA: NOT DETECTED

## 2018-12-31 MED ORDER — OXYCODONE HCL 15 MG PO TABS: 15.0000 mg | ORAL_TABLET | Freq: Three times a day (TID) | ORAL | 0 refills | Status: DC | PRN

## 2018-12-31 MED ORDER — ALPRAZOLAM 0.5 MG PO TABS: 0.5000 mg | ORAL_TABLET | Freq: Two times a day (BID) | ORAL | 2 refills | Status: DC | PRN

## 2018-12-31 NOTE — Patient Instructions (Signed)
PLEASE FEEL FREE TO CALL OUR OFFICE WITH ANY PROBLEMS OR QUESTIONS ([phone])      
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
## 2018-12-31 NOTE — Telephone Encounter (Signed)
Medication refill: Oxycodone e-scribed. PMP was reviewed.

## 2019-01-02 DIAGNOSIS — F410 Panic disorder [episodic paroxysmal anxiety] without agoraphobia: Secondary | ICD-10-CM | POA: Diagnosis not present

## 2019-01-02 DIAGNOSIS — F419 Anxiety disorder, unspecified: Secondary | ICD-10-CM | POA: Diagnosis not present

## 2019-01-02 DIAGNOSIS — J45909 Unspecified asthma, uncomplicated: Secondary | ICD-10-CM | POA: Diagnosis not present

## 2019-01-02 DIAGNOSIS — R0602 Shortness of breath: Secondary | ICD-10-CM | POA: Diagnosis not present

## 2019-01-03 ENCOUNTER — Other Ambulatory Visit: Payer: Self-pay | Admitting: Registered Nurse

## 2019-01-05 ENCOUNTER — Other Ambulatory Visit: Payer: Self-pay

## 2019-01-05 MED ORDER — PREGABALIN 150 MG PO CAPS: 150.0000 mg | ORAL_CAPSULE | Freq: Three times a day (TID) | ORAL | 3 refills | Status: DC

## 2019-01-13 DIAGNOSIS — R0989 Other specified symptoms and signs involving the circulatory and respiratory systems: Secondary | ICD-10-CM | POA: Diagnosis not present

## 2019-01-13 DIAGNOSIS — I110 Hypertensive heart disease with heart failure: Secondary | ICD-10-CM | POA: Diagnosis not present

## 2019-01-13 DIAGNOSIS — I509 Heart failure, unspecified: Secondary | ICD-10-CM | POA: Diagnosis not present

## 2019-01-13 DIAGNOSIS — J45909 Unspecified asthma, uncomplicated: Secondary | ICD-10-CM | POA: Diagnosis not present

## 2019-01-13 DIAGNOSIS — Z03818 Encounter for observation for suspected exposure to other biological agents ruled out: Secondary | ICD-10-CM | POA: Diagnosis not present

## 2019-01-13 DIAGNOSIS — R5383 Other fatigue: Secondary | ICD-10-CM | POA: Diagnosis not present

## 2019-01-13 DIAGNOSIS — R0602 Shortness of breath: Secondary | ICD-10-CM | POA: Diagnosis not present

## 2019-01-22 DIAGNOSIS — R0602 Shortness of breath: Secondary | ICD-10-CM | POA: Diagnosis not present

## 2019-01-22 DIAGNOSIS — R5383 Other fatigue: Secondary | ICD-10-CM | POA: Diagnosis not present

## 2019-01-22 DIAGNOSIS — R61 Generalized hyperhidrosis: Secondary | ICD-10-CM | POA: Diagnosis not present
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
## 2019-02-04 ENCOUNTER — Other Ambulatory Visit: Payer: Self-pay

## 2019-02-04 ENCOUNTER — Encounter: Payer: Self-pay | Admitting: Physician Assistant

## 2019-02-04 ENCOUNTER — Ambulatory Visit (INDEPENDENT_AMBULATORY_CARE_PROVIDER_SITE_OTHER): Payer: Medicare Other | Admitting: Physician Assistant

## 2019-02-04 VITALS — BP 120/70 | HR 104 | Ht 73.0 in | Wt 269.0 lb

## 2019-02-04 DIAGNOSIS — I10 Essential (primary) hypertension: Secondary | ICD-10-CM

## 2019-02-04 DIAGNOSIS — E782 Mixed hyperlipidemia: Secondary | ICD-10-CM

## 2019-02-04 DIAGNOSIS — I5022 Chronic systolic (congestive) heart failure: Secondary | ICD-10-CM

## 2019-02-04 DIAGNOSIS — E785 Hyperlipidemia, unspecified: Secondary | ICD-10-CM

## 2019-02-04 DIAGNOSIS — I428 Other cardiomyopathies: Secondary | ICD-10-CM

## 2019-02-04 LAB — BASIC METABOLIC PANEL
BUN/Creatinine Ratio: 15 (ref 9–20)
BUN: 18 mg/dL (ref 6–24)
CO2: 19 mmol/L — ABNORMAL LOW (ref 20–29)
Calcium: 10.3 mg/dL — ABNORMAL HIGH (ref 8.7–10.2)
Chloride: 101 mmol/L (ref 96–106)
Creatinine, Ser: 1.18 mg/dL (ref 0.76–1.27)
GFR calc Af Amer: 79 mL/min/{1.73_m2} (ref >59)
GFR calc non Af Amer: 68 mL/min/{1.73_m2} (ref >59)
Glucose: 111 mg/dL — ABNORMAL HIGH (ref 65–99)
Potassium: 4.6 mmol/L (ref 3.5–5.2)
Sodium: 138 mmol/L (ref 134–144)

## 2019-02-04 LAB — LIPID PANEL
Chol/HDL Ratio: 4.9 ratio (ref 0.0–5.0)
Cholesterol, Total: 207 mg/dL — ABNORMAL HIGH (ref 100–199)
HDL: 42 mg/dL (ref >39)
LDL Chol Calc (NIH): 141 mg/dL — ABNORMAL HIGH (ref 0–99)
Triglycerides: 135 mg/dL (ref 0–149)
VLDL Cholesterol Cal: 24 mg/dL (ref 5–40)

## 2019-02-04 MED ORDER — CARVEDILOL 25 MG PO TABS: 25.0000 mg | ORAL_TABLET | Freq: Two times a day (BID) | ORAL | 3 refills | Status: DC

## 2019-02-04 MED ORDER — SIMVASTATIN 40 MG PO TABS: 40.0000 mg | ORAL_TABLET | Freq: Every evening | ORAL | 3 refills | Status: DC

## 2019-02-04 MED ORDER — FUROSEMIDE 40 MG PO TABS: 40.0000 mg | ORAL_TABLET | Freq: Every day | ORAL | 3 refills | Status: DC

## 2019-02-04 NOTE — Patient Instructions (Signed)
Medication Instructions:  Your physician has recommended you make the following change in your medication:   1. INCREASE: carvedilol (coreg) to 25 mg twice a day  2. DECREASE: furosemide (lasix) to 40 mg once a day. You may take an extra tablet as needed for shortness of breath   Lab work: TODAY: BMET, LIPIDS  If you have labs (blood work) drawn today and your tests are completely normal, you will receive your results only by: Marland Kitchen MyChart Message (if you have MyChart) OR . A paper copy in the mail If you have any lab test that is abnormal or we need to change your treatment, we will call you to review the results.  Testing/Procedures: Your physician has requested that you have an echocardiogram. Echocardiography is a painless test that uses sound waves to create images of your heart. It provides your doctor with information about the size and shape of your heart and how well your heart's chambers and valves are working. This procedure takes approximately one hour. There are no restrictions for this procedure.   Follow-Up: Follow up with Ermalinda Barrios, PA for a VIRTUAL VISIT after echocardiogram  Any Other Special Instructions Will Be Listed Below (If Applicable).  Limit your fluid intake to 1 liter of fluid a day  Two Gram Sodium Diet 2000 mg  What is Sodium? Sodium is a mineral found naturally in many foods. The most significant source of sodium in the diet is table salt, which is about 40% sodium.  Processed, convenience, and preserved foods also contain a large amount of sodium.  The body needs only 500 mg of sodium daily to function,  A normal diet provides more than enough sodium even if you do not use salt.  Why Limit Sodium? A build up of sodium in the body can cause thirst, increased blood pressure, shortness of breath, and water retention.  Decreasing sodium in the diet can reduce edema and risk of heart attack or stroke associated with high blood pressure.  Keep in mind that  there are many other factors involved in these health problems.  Heredity, obesity, lack of exercise, cigarette smoking, stress and what you eat all play a role.  General Guidelines:  Do not add salt at the table or in cooking.  One teaspoon of salt contains over 2 grams of sodium.  Read food labels  Avoid processed and convenience foods  Ask your dietitian before eating any foods not dicussed in the menu planning guidelines  Consult your physician if you wish to use a salt substitute or a sodium containing medication such as antacids.  Limit milk and milk products to 16 oz (2 cups) per day.  Shopping Hints:  READ LABELS!! "Dietetic" does not necessarily mean low sodium.  Salt and other sodium ingredients are often added to foods during processing.   Menu Planning Guidelines Food Group Choose More Often Avoid  Beverages (see also the milk group All fruit juices, low-sodium, salt-free vegetables juices, low-sodium carbonated beverages Regular vegetable or tomato juices, commercially softened water used for drinking or cooking  Breads and Cereals Enriched white, wheat, rye and pumpernickel bread, hard rolls and dinner rolls; muffins, cornbread and waffles; most dry cereals, cooked cereal without added salt; unsalted crackers and breadsticks; low sodium or homemade bread crumbs Bread, rolls and crackers with salted tops; quick breads; instant hot cereals; pancakes; commercial bread stuffing; self-rising flower and biscuit mixes; regular bread crumbs or cracker crumbs  Desserts and Sweets Desserts and sweets mad with mild  should be within allowance Instant pudding mixes and cake mixes  Fats Butter or margarine; vegetable oils; unsalted salad dressings, regular salad dressings limited to 1 Tbs; light, sour and heavy cream Regular salad dressings containing bacon fat, bacon bits, and salt pork; snack dips made with instant soup mixes or processed cheese; salted nuts  Fruits Most fresh, frozen  and canned fruits Fruits processed with salt or sodium-containing ingredient (some dried fruits are processed with sodium sulfites        Vegetables Fresh, frozen vegetables and low- sodium canned vegetables Regular canned vegetables, sauerkraut, pickled vegetables, and others prepared in brine; frozen vegetables in sauces; vegetables seasoned with ham, bacon or salt pork  Condiments, Sauces, Miscellaneous  Salt substitute with physician's approval; pepper, herbs, spices; vinegar, lemon or lime juice; hot pepper sauce; garlic powder, onion powder, low sodium soy sauce (1 Tbs.); low sodium condiments (ketchup, chili sauce, mustard) in limited amounts (1 tsp.) fresh ground horseradish; unsalted tortilla chips, pretzels, potato chips, popcorn, salsa (1/4 cup) Any seasoning made with salt including garlic salt, celery salt, onion salt, and seasoned salt; sea salt, rock salt, kosher salt; meat tenderizers; monosodium glutamate; mustard, regular soy sauce, barbecue, sauce, chili sauce, teriyaki sauce, steak sauce, Worcestershire sauce, and most flavored vinegars; canned gravy and mixes; regular condiments; salted snack foods, olives, picles, relish, horseradish sauce, catsup   Food preparation: Try these seasonings Meats:    Pork Sage, onion Serve with applesauce  Chicken Poultry seasoning, thyme, parsley Serve with cranberry sauce  Lamb Curry powder, rosemary, garlic, thyme Serve with mint sauce or jelly  Veal Marjoram, basil Serve with current jelly, cranberry sauce  Beef Pepper, bay leaf Serve with dry mustard, unsalted chive butter  Fish [place] leaf, dill Serve with unsalted lemon butter, unsalted parsley butter  Vegetables:    Asparagus Lemon juice   Broccoli Lemon juice   Carrots Mustard dressing parsley, mint, nutmeg, glazed with unsalted butter and sugar   Green beans Marjoram, lemon juice, nutmeg,dill seed   Tomatoes Basil, marjoram, onion   Spice /blend for [employer]" 4 tsp ground  thyme 1 tsp ground sage 3 tsp ground rosemary 4 tsp ground marjoram   Test your knowledge 1. A product that says "Salt Free" may still contain sodium. True or False 2. Garlic Powder and Hot Pepper Sauce an be used as alternative seasonings.True or False 3. Processed foods have more sodium than fresh foods.  True or False 4. Canned Vegetables have less sodium than froze True or False  WAYS TO DECREASE YOUR SODIUM INTAKE 1. Avoid the use of added salt in cooking and at the table.  Table salt (and other prepared seasonings which contain salt) is probably one of the greatest sources of sodium in the diet.  Unsalted foods can gain flavor from the sweet, sour, and butter taste sensations of herbs and spices.  Instead of using salt for seasoning, try the following seasonings with the foods listed.  Remember: how you use them to enhance natural food flavors is limited only by your creativity... Allspice-Meat, fish, eggs, fruit, peas, red and yellow vegetables Almond Extract-Fruit baked goods Anise Seed-Sweet breads, fruit, carrots, beets, cottage cheese, cookies (tastes like licorice) Basil-Meat, fish, eggs, vegetables, rice, vegetables salads, soups, sauces Bay Leaf-Meat, fish, stews, poultry Burnet-Salad, vegetables (cucumber-like flavor) Caraway Seed-Bread, cookies, cottage cheese, meat, vegetables, cheese, rice Cardamon-Baked goods, fruit, soups Celery Powder or seed-Salads, salad dressings, sauces, meatloaf, soup, bread.Do not use  celery salt Chervil-Meats, salads, fish, eggs, vegetables, cottage  cheese (parsley-like flavor) Chili Power-Meatloaf, chicken cheese, corn, eggplant, egg dishes Chives-Salads cottage cheese, egg dishes, soups, vegetables, sauces Cilantro-Salsa, casseroles Cinnamon-Baked goods, fruit, pork, lamb, chicken, carrots Cloves-Fruit, baked goods, fish, pot roast, green beans, beets, carrots Coriander-Pastry, cookies, meat, salads, cheese (lemon-orange  flavor) Cumin-Meatloaf, fish,cheese, eggs, cabbage,fruit pie (caraway flavor) [employer], fruit, eggs, fish, poultry, cottage cheese, vegetables Dill Seed-Meat, cottage cheese, poultry, vegetables, fish, salads, bread Fennel Seed-Bread, cookies, apples, pork, eggs, fish, beets, cabbage, cheese, Licorice-like flavor Garlic-(buds or powder) Salads, meat, poultry, fish, bread, butter, vegetables, potatoes.Do not  use garlic salt Ginger-Fruit, vegetables, baked goods, meat, fish, poultry Horseradish Root-Meet, vegetables, butter Lemon Juice or Extract-Vegetables, fruit, tea, baked goods, fish salads Mace-Baked goods fruit, vegetables, fish, poultry (taste like nutmeg) Maple Extract-Syrups Marjoram-Meat, chicken, fish, vegetables, breads, green salads (taste like Sage) Mint-Tea, lamb, sherbet, vegetables, desserts, carrots, cabbage Mustard, Dry or Seed-Cheese, eggs, meats, vegetables, poultry Nutmeg-Baked goods, fruit, chicken, eggs, vegetables, desserts Onion Powder-Meat, fish, poultry, vegetables, cheese, eggs, bread, rice salads (Do not use   Onion salt) Orange Extract-Desserts, baked goods Oregano-Pasta, eggs, cheese, onions, pork, lamb, fish, chicken, vegetables, green salads Paprika-Meat, fish, poultry, eggs, cheese, vegetables Parsley Flakes-Butter, vegetables, meat fish, poultry, eggs, bread, salads (certain forms may   Contain sodium Pepper-Meat fish, poultry, vegetables, eggs Peppermint Extract-Desserts, baked goods Poppy Seed-Eggs, bread, cheese, fruit dressings, baked goods, noodles, vegetables, cottage  [employer], poultry, meat, fish, cauliflower, turnips,eggs bread Saffron-Rice, bread, veal, chicken, fish, eggs Sage-Meat, fish, poultry, onions, eggplant, tomateos, pork, stews Savory-Eggs, salads, poultry, meat, rice, vegetables, soups, pork Tarragon-Meat, poultry, fish, eggs, butter, vegetables (licorice-like  flavor)  Thyme-Meat, poultry, fish, eggs, vegetables, (clover-like flavor), sauces, soups Tumeric-Salads, butter, eggs, fish, rice, vegetables (saffron-like flavor) Vanilla Extract-Baked goods, candy Vinegar-Salads, vegetables, meat marinades Walnut Extract-baked goods, candy  2. Choose your Foods Wisely   The following is a list of foods to avoid which are high in sodium:  Meats-Avoid all smoked, canned, salt cured, dried and kosher meat and fish as well as Anchovies   [employer] meats:Bologna, Liverwurst, Pastrami Canned meat or fish  Marinated herring Caviar    Pepperoni Corned Beef   Pizza Dried chipped beef  Salami Frozen breaded fish or meat Salt pork Frankfurters or hot dogs  Sardines Gefilte fish   Sausage Ham (boiled ham, Proscuitto Smoked butt    spiced ham)   Spam      TV Dinners Vegetables Canned vegetables (Regular) Relish Canned mushrooms  Sauerkraut Olives    Tomato juice Pickles  Bakery and Dessert Products Canned puddings  Cream pies Cheesecake   Decorated cakes Cookies  Beverages/Juices Tomato juice, regular  Gatorade   V-8 vegetable juice, regular  Breads and Cereals Biscuit mixes   Salted potato chips, corn chips, pretzels Bread stuffing mixes  Salted crackers and rolls Pancake and waffle mixes Self-rising flour  Seasonings Accent    Meat sauces Barbecue sauce  Meat tenderizer Catsup    Monosodium glutamate (MSG) Celery salt   Onion salt Chili sauce   Prepared mustard Garlic salt   Salt, seasoned salt, sea salt Gravy mixes   Soy sauce Horseradish   Steak sauce Ketchup   Tartar sauce Lite salt    Teriyaki sauce Marinade mixes   Worcestershire sauce  Others Baking powder   Cocoa and cocoa mixes Baking soda   Commercial casserole mixes Candy-caramels, chocolate  Dehydrated soups    Bars, fudge,nougats  Instant rice and pasta mixes Canned broth or soup  Maraschino cherries Cheese, aged and processed cheese and cheese  spreads  Learning Assessment Quiz  Indicated T (for True) or F (for False) for each of the following statements:  1. _____ Fresh fruits and vegetables and unprocessed grains are generally low in sodium 2. _____ Water may contain a considerable amount of sodium, depending on the source 3. _____ You can always tell if a food is high in sodium by tasting it 4. _____ Certain laxatives my be high in sodium and should be avoided unless prescribed   by a physician or pharmacist 5. _____ Salt substitutes may be used freely by anyone on a sodium restricted diet 6. _____ Sodium is present in table salt, food additives and as a natural component of   most foods 7. _____ Table salt is approximately 90% sodium 8. _____ Limiting sodium intake may help prevent excess fluid accumulation in the body 9. _____ On a sodium-restricted diet, seasonings such as bouillon soy sauce, and    cooking wine should be used in place of table salt 10. _____ On an ingredient list, a product which lists monosodium glutamate as the first   ingredient is an appropriate food to include on a low sodium diet  Circle the best answer(s) to the following statements (Hint: there may be more than one correct answer)  11. On a low-sodium diet, some acceptable snack items are:    A. Olives  F. Bean dip   K. Grapefruit juice    B. Salted Pretzels G. Commercial Popcorn   L. Canned peaches    C. Carrot Sticks  H. Bouillon   M. Unsalted nuts   D. [place] fries  I. Peanut butter crackers N. Salami   E. Sweet pickles J. Tomato Juice   O. Pizza  12.  Seasonings that may be used freely on a reduced - sodium diet include   A. Lemon wedges F.Monosodium glutamate K. Celery seed    B.Soysauce   G. Pepper   L. Mustard powder   C. Sea salt  H. Cooking wine  M. Onion flakes   D. Vinegar  E. Prepared horseradish N. Salsa   E. Sage   J. Worcestershire sauce  O. Chutney

## 2019-02-09 ENCOUNTER — Other Ambulatory Visit: Payer: Self-pay

## 2019-02-09 ENCOUNTER — Ambulatory Visit (HOSPITAL_COMMUNITY): Payer: Medicare Other | Attending: Cardiology

## 2019-02-09 DIAGNOSIS — I428 Other cardiomyopathies: Secondary | ICD-10-CM | POA: Diagnosis not present

## 2019-02-09 MED ORDER — PERFLUTREN LIPID MICROSPHERE
1.0000 mL | INTRAVENOUS | Status: AC | PRN
  Administered 2019-02-09: 1 mL via INTRAVENOUS

## 2019-02-10 ENCOUNTER — Telehealth: Payer: Self-pay | Admitting: Interventional Cardiology

## 2019-02-10 NOTE — Telephone Encounter (Signed)
Follow up:    Patient returning a call back concerning ECHO results. Please call back.

## 2019-02-10 NOTE — Telephone Encounter (Signed)
Left message for patient to call back  

## 2019-02-11 NOTE — Telephone Encounter (Signed)
Follow Up  Patient is calling in again for his echo result. Please give patient a call back.

## 2019-02-11 NOTE — Telephone Encounter (Signed)
Follow up   Patient is returning call about echo results. Please call.

## 2019-02-11 NOTE — Telephone Encounter (Signed)
Left message for patient to call back  

## 2019-02-12 ENCOUNTER — Telehealth: Payer: Self-pay

## 2019-02-12 DIAGNOSIS — I5022 Chronic systolic (congestive) heart failure: Secondary | ICD-10-CM

## 2019-02-12 NOTE — Telephone Encounter (Signed)
Reviewed results with patient who verbalized understanding.   CHF referral placed. The patient was grateful for assistance.

## 2019-02-12 NOTE — Telephone Encounter (Signed)
Left message for patient to call back. PLEASE TRANSFER CALL TO ME WHEN PATIENT CALLS BACK.

## 2019-02-12 NOTE — Telephone Encounter (Signed)
Other RN spoke with patient. See documentation below:  Notes recorded by Theodoro Parma, RN on 02/12/2019 at 8:53 AM EDT  Reviewed results with patient who verbalized understanding.   CHF referral placed.  The patient was grateful for assistance.

## 2019-02-12 NOTE — Telephone Encounter (Signed)
-----   Message from Imogene Burn, PA-C sent at 02/09/2019  3:18 PM EDT ----- LVEF still low 20-25%. Refer to advanced CHF clinic. thanks

## 2019-02-13 ENCOUNTER — Encounter (HOSPITAL_COMMUNITY): Payer: Self-pay | Admitting: *Deleted
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
## 2019-02-17 ENCOUNTER — Other Ambulatory Visit: Payer: Self-pay

## 2019-02-17 ENCOUNTER — Telehealth (INDEPENDENT_AMBULATORY_CARE_PROVIDER_SITE_OTHER): Payer: Medicare Other | Admitting: Physician Assistant

## 2019-02-17 ENCOUNTER — Encounter: Payer: Self-pay | Admitting: Physician Assistant

## 2019-02-17 VITALS — Wt 269.0 lb

## 2019-02-17 DIAGNOSIS — I5022 Chronic systolic (congestive) heart failure: Secondary | ICD-10-CM

## 2019-02-17 DIAGNOSIS — I420 Dilated cardiomyopathy: Secondary | ICD-10-CM

## 2019-02-17 DIAGNOSIS — E785 Hyperlipidemia, unspecified: Secondary | ICD-10-CM | POA: Diagnosis not present

## 2019-02-17 DIAGNOSIS — I10 Essential (primary) hypertension: Secondary | ICD-10-CM | POA: Diagnosis not present

## 2019-02-17 NOTE — Patient Instructions (Addendum)
Medication Instructions:  Your physician recommends that you continue on your current medications as directed. Please refer to the Current Medication list given to you today.  If you need a refill on your cardiac medications before your next appointment, please call your pharmacy.   Lab work: None Ordered  If you have labs (blood work) drawn today and your tests are completely normal, you will receive your results only by: Marland Kitchen MyChart Message (if you have MyChart) OR . A paper copy in the mail If you have any lab test that is abnormal or we need to change your treatment, we will call you to review the results.  Testing/Procedures: None ordered  Follow-Up: Keep appointment with Dr. [name] on 10/29 at 2:40 PM.  At [facility], you and your health needs are our priority.  As part of our continuing mission to provide you with exceptional heart care, we have created designated Provider Care Teams.  These Care Teams include your primary Cardiologist (physician) and Advanced Practice Providers (APPs -  Physician Assistants and Nurse Practitioners) who all work together to provide you with the care you need, when you need it. . You will need a follow up appointment in 6 months.  Please call our office 2 months in advance to schedule this appointment.  You may see Casandra Doffing, MD or one of the following Advanced Practice Providers on your designated Care Team:   . Lyda Jester, PA-C . Dayna Dunn, PA-C . Ermalinda Barrios, PA-C  Any Other Special Instructions Will Be Listed Below (If Applicable).

## 2019-02-24 ENCOUNTER — Other Ambulatory Visit: Payer: Self-pay

## 2019-02-24 ENCOUNTER — Ambulatory Visit
Admission: RE | Admit: 2019-02-24 | Discharge: 2019-02-24 | Disposition: A | Discharge status: Home / Self Care | Payer: Medicare Other | Source: Ambulatory Visit | Attending: Registered Nurse | Admitting: Registered Nurse

## 2019-02-24 ENCOUNTER — Encounter: Payer: Self-pay | Admitting: Registered Nurse

## 2019-02-24 ENCOUNTER — Encounter: Payer: Medicare Other | Attending: Physical Medicine and Rehabilitation | Admitting: Registered Nurse

## 2019-02-24 VITALS — BP 95/67 | HR 98 | Temp 98.4°F | Ht 73.0 in | Wt 276.0 lb

## 2019-02-24 DIAGNOSIS — S82831A Other fracture of upper and lower end of right fibula, initial encounter for closed fracture: Secondary | ICD-10-CM | POA: Diagnosis not present

## 2019-02-24 DIAGNOSIS — W19XXXA Unspecified fall, initial encounter: Secondary | ICD-10-CM | POA: Diagnosis not present

## 2019-02-24 DIAGNOSIS — M7989 Other specified soft tissue disorders: Secondary | ICD-10-CM | POA: Diagnosis not present

## 2019-02-24 DIAGNOSIS — M25561 Pain in right knee: Secondary | ICD-10-CM

## 2019-02-24 DIAGNOSIS — S8412XS Injury of peroneal nerve at lower leg level, left leg, sequela: Secondary | ICD-10-CM | POA: Diagnosis not present

## 2019-02-24 DIAGNOSIS — G894 Chronic pain syndrome: Secondary | ICD-10-CM | POA: Diagnosis not present

## 2019-02-24 DIAGNOSIS — S8411XA Injury of peroneal nerve at lower leg level, right leg, initial encounter: Secondary | ICD-10-CM | POA: Diagnosis not present

## 2019-02-24 DIAGNOSIS — M25571 Pain in right ankle and joints of right foot: Secondary | ICD-10-CM | POA: Diagnosis not present

## 2019-02-24 DIAGNOSIS — M7522 Bicipital tendinitis, left shoulder: Secondary | ICD-10-CM | POA: Insufficient documentation

## 2019-02-24 DIAGNOSIS — M722 Plantar fascial fibromatosis: Secondary | ICD-10-CM | POA: Insufficient documentation

## 2019-02-24 DIAGNOSIS — S99911A Unspecified injury of right ankle, initial encounter: Secondary | ICD-10-CM | POA: Diagnosis not present

## 2019-02-24 DIAGNOSIS — Z5181 Encounter for therapeutic drug level monitoring: Secondary | ICD-10-CM

## 2019-02-24 DIAGNOSIS — M1732 Unilateral post-traumatic osteoarthritis, left knee: Secondary | ICD-10-CM | POA: Diagnosis not present

## 2019-02-24 DIAGNOSIS — F418 Other specified anxiety disorders: Secondary | ICD-10-CM | POA: Diagnosis not present

## 2019-02-24 DIAGNOSIS — Z79891 Long term (current) use of opiate analgesic: Secondary | ICD-10-CM

## 2019-02-24 MED ORDER — OXYCODONE HCL 15 MG PO TABS: 15.0000 mg | ORAL_TABLET | Freq: Three times a day (TID) | ORAL | 0 refills | Status: DC | PRN
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
## 2019-02-25 ENCOUNTER — Telehealth: Payer: Self-pay | Admitting: Registered Nurse

## 2019-02-25 DIAGNOSIS — H2000 Unspecified acute and subacute iridocyclitis: Secondary | ICD-10-CM | POA: Diagnosis not present

## 2019-02-25 DIAGNOSIS — H4051X1 Glaucoma secondary to other eye disorders, right eye, mild stage: Secondary | ICD-10-CM | POA: Diagnosis not present

## 2019-02-25 DIAGNOSIS — S83281A Other tear of lateral meniscus, current injury, right knee, initial encounter: Secondary | ICD-10-CM | POA: Diagnosis not present

## 2019-02-25 DIAGNOSIS — S82831A Other fracture of upper and lower end of right fibula, initial encounter for closed fracture: Secondary | ICD-10-CM | POA: Diagnosis not present

## 2019-02-25 NOTE — Telephone Encounter (Signed)
Placed a call to Victor [name]: X-ray Results were Reviewed: he will go to [facility] today, he reports. He was instructed to call office with update, he verbalizes understanding.

## 2019-02-26 DIAGNOSIS — M25561 Pain in right knee: Secondary | ICD-10-CM | POA: Diagnosis not present

## 2019-02-26 NOTE — Telephone Encounter (Signed)
Return Victor [name] call, he went to [facility] care  on 02/25/2019, regarding his right knee X-ray:  IMPRESSION: 1. Suspected a nondisplaced fracture of the proximal right fibular diaphysis, best seen on lateral view. No other evidence of fracture or dislocation of the right knee. They have ordered a right leg air cast for him and he will F/u with his orthopedic in two weeks, he states.  Marland Kitchen

## 2019-02-26 NOTE — Telephone Encounter (Signed)
Victor [name] returned your call. He went to the ortho urgent care. He is asking for a call back.

## 2019-02-27 DIAGNOSIS — F419 Anxiety disorder, unspecified: Secondary | ICD-10-CM | POA: Diagnosis not present

## 2019-02-27 DIAGNOSIS — I509 Heart failure, unspecified: Secondary | ICD-10-CM | POA: Diagnosis not present

## 2019-03-01 LAB — DRUG TOX MONITOR 1 W/CONF, ORAL FLD
Amphetamines: NEGATIVE ng/mL (ref <10)
Barbiturates: NEGATIVE ng/mL (ref <10)
Benzodiazepines: NEGATIVE ng/mL (ref <0.50)
Buprenorphine: NEGATIVE ng/mL (ref <0.10)
Cocaine: NEGATIVE ng/mL (ref <5.0)
Codeine: NEGATIVE ng/mL (ref <2.5)
Dihydrocodeine: NEGATIVE ng/mL (ref <2.5)
Fentanyl: NEGATIVE ng/mL (ref <0.10)
Heroin Metabolite: NEGATIVE ng/mL (ref <1.0)
Hydrocodone: NEGATIVE ng/mL (ref <2.5)
Hydromorphone: NEGATIVE ng/mL (ref <2.5)
MARIJUANA: NEGATIVE ng/mL (ref <2.5)
MDMA: NEGATIVE ng/mL (ref <10)
Meprobamate: NEGATIVE ng/mL (ref <2.5)
Methadone: NEGATIVE ng/mL (ref <5.0)
Morphine: NEGATIVE ng/mL (ref <2.5)
Nicotine Metabolite: NEGATIVE ng/mL (ref <5.0)
Norhydrocodone: NEGATIVE ng/mL (ref <2.5)
Noroxycodone: 5 ng/mL — ABNORMAL HIGH (ref <2.5)
Opiates: POSITIVE ng/mL — AB (ref <2.5)
Oxycodone: 68.4 ng/mL — ABNORMAL HIGH (ref <2.5)
Oxymorphone: NEGATIVE ng/mL (ref <2.5)
Phencyclidine: NEGATIVE ng/mL (ref <10)
Tapentadol: NEGATIVE ng/mL (ref <5.0)
Tramadol: NEGATIVE ng/mL (ref <5.0)
Zolpidem: NEGATIVE ng/mL (ref <5.0)

## 2019-03-01 LAB — DRUG TOX ALC METAB W/CON, ORAL FLD: Alcohol Metabolite: NEGATIVE ng/mL (ref <25)

## 2019-03-02 ENCOUNTER — Telehealth: Payer: Self-pay | Admitting: *Deleted

## 2019-03-02 NOTE — Telephone Encounter (Signed)
Oral swab is consistent for having oxycodone and its metabolites. However, Victor [name] reported taking his xanax at 6 am the morning of the test. The test was performed at 8:27 am. There is no parent drug alprazolam or any of its metabolites present as would be expected. I spoke with [facility] toxicologist. This result would not be expected.

## 2019-03-04 DIAGNOSIS — M25571 Pain in right ankle and joints of right foot: Secondary | ICD-10-CM | POA: Diagnosis not present

## 2019-03-04 DIAGNOSIS — S82424A Nondisplaced transverse fracture of shaft of right fibula, initial encounter for closed fracture: Secondary | ICD-10-CM | POA: Diagnosis not present

## 2019-03-05 ENCOUNTER — Ambulatory Visit (HOSPITAL_COMMUNITY)
Admission: RE | Admit: 2019-03-05 | Discharge: 2019-03-05 | Disposition: A | Discharge status: Home / Self Care | Payer: Medicare Other | Source: Ambulatory Visit | Attending: Internal Medicine | Admitting: Internal Medicine

## 2019-03-05 ENCOUNTER — Encounter (HOSPITAL_COMMUNITY): Payer: Self-pay | Admitting: Internal Medicine

## 2019-03-05 ENCOUNTER — Ambulatory Visit (HOSPITAL_COMMUNITY): Payer: Medicare Other | Admitting: Internal Medicine

## 2019-03-05 ENCOUNTER — Other Ambulatory Visit: Payer: Self-pay

## 2019-03-05 VITALS — BP 119/78 | HR 87 | Wt 270.0 lb

## 2019-03-05 DIAGNOSIS — J45909 Unspecified asthma, uncomplicated: Secondary | ICD-10-CM | POA: Insufficient documentation

## 2019-03-05 DIAGNOSIS — I5020 Unspecified systolic (congestive) heart failure: Secondary | ICD-10-CM | POA: Diagnosis not present

## 2019-03-05 DIAGNOSIS — I428 Other cardiomyopathies: Secondary | ICD-10-CM | POA: Insufficient documentation

## 2019-03-05 DIAGNOSIS — I10 Essential (primary) hypertension: Secondary | ICD-10-CM | POA: Diagnosis not present

## 2019-03-05 DIAGNOSIS — Z7982 Long term (current) use of aspirin: Secondary | ICD-10-CM | POA: Insufficient documentation

## 2019-03-05 DIAGNOSIS — I5022 Chronic systolic (congestive) heart failure: Secondary | ICD-10-CM

## 2019-03-05 DIAGNOSIS — R5383 Other fatigue: Secondary | ICD-10-CM | POA: Insufficient documentation

## 2019-03-05 DIAGNOSIS — Z79899 Other long term (current) drug therapy: Secondary | ICD-10-CM | POA: Insufficient documentation

## 2019-03-05 DIAGNOSIS — R531 Weakness: Secondary | ICD-10-CM | POA: Insufficient documentation

## 2019-03-05 DIAGNOSIS — I110 Hypertensive heart disease with heart failure: Secondary | ICD-10-CM | POA: Insufficient documentation

## 2019-03-05 DIAGNOSIS — I429 Cardiomyopathy, unspecified: Secondary | ICD-10-CM | POA: Diagnosis not present

## 2019-03-05 LAB — CBC
HCT: 57.3 % — ABNORMAL HIGH (ref 39.0–52.0)
Hemoglobin: 19.1 g/dL — ABNORMAL HIGH (ref 13.0–17.0)
MCH: 30.8 pg (ref 26.0–34.0)
MCHC: 33.3 g/dL (ref 30.0–36.0)
MCV: 92.4 fL (ref 80.0–100.0)
Platelets: 200 10*3/uL (ref 150–400)
RBC: 6.2 MIL/uL — ABNORMAL HIGH (ref 4.22–5.81)
RDW: 14.5 % (ref 11.5–15.5)
WBC: 6.3 10*3/uL (ref 4.0–10.5)
nRBC: 0 % (ref 0.0–0.2)

## 2019-03-05 LAB — COMPREHENSIVE METABOLIC PANEL
ALT: 31 U/L (ref 0–44)
AST: 24 U/L (ref 15–41)
Albumin: 4.2 g/dL (ref 3.5–5.0)
Alkaline Phosphatase: 55 U/L (ref 38–126)
Anion gap: 12 (ref 5–15)
BUN: 13 mg/dL (ref 6–20)
CO2: 20 mmol/L — ABNORMAL LOW (ref 22–32)
Calcium: 9.3 mg/dL (ref 8.9–10.3)
Chloride: 106 mmol/L (ref 98–111)
Creatinine, Ser: 1.02 mg/dL (ref 0.61–1.24)
GFR calc Af Amer: >60 mL/min (ref >60)
GFR calc non Af Amer: >60 mL/min (ref >60)
Glucose, Bld: 91 mg/dL (ref 70–99)
Potassium: 4 mmol/L (ref 3.5–5.1)
Sodium: 138 mmol/L (ref 135–145)
Total Bilirubin: 2 mg/dL — ABNORMAL HIGH (ref 0.3–1.2)
Total Protein: 7.3 g/dL (ref 6.5–8.1)

## 2019-03-05 LAB — BRAIN NATRIURETIC PEPTIDE: B Natriuretic Peptide: 143 pg/mL — ABNORMAL HIGH (ref 0.0–100.0)

## 2019-03-05 LAB — TSH: TSH: 0.867 u[IU]/mL (ref 0.350–4.500)

## 2019-03-05 MED ORDER — SACUBITRIL-VALSARTAN 24-26 MG PO TABS: 1.0000 | ORAL_TABLET | Freq: Two times a day (BID) | ORAL | 6 refills | Status: DC

## 2019-03-05 NOTE — Patient Instructions (Addendum)
STOP Lisinopril  START Entresto 24/26mg  (1 tab) twice a day  Labs today We will only contact you if something comes back abnormal or we need to make some changes. Otherwise no news is good news!  COVID SCREEN: Tomorrow at 1:30p. [facility]).  This is a drive through tent. DO NOT LEAVE CAR.  After this test, please remain quarantined until the day of your procedure.   SEE HEART CATHETERIZATION  INFORMATION BELOW   [facility] [place] [id number] [place] [id number] Dept: [phone] Loc: [phone]  Jamaire Declerck  03/05/2019  You are scheduled for a Cardiac Catheterization on Tuesday, November 3 with Dr. Glori Bickers.  1. Please arrive at the [facility] (Main Entrance A) at [facility]: [address] [place], [place] [address] at 6:30 AM (This time is two hours before your procedure to ensure your preparation). Free valet parking service is available.   Special note: Every effort is made to have your procedure done on time. Please understand that emergencies sometimes delay scheduled procedures.  2. Diet: Do not eat solid foods or drink after midnight.    3. Labs: Done while in office.   4. Medication instructions in preparation for your procedure:   Contrast Allergy: No  HOLD ALL medications except for aspirin.  On the morning of your procedure, take your Aspirin.  You may use a small sip of water.  5. Plan for one night stay--bring personal belongings. 6. Bring a current list of your medications and current insurance cards. 7. You MUST have a responsible person to drive you home. 8. Someone MUST be with you the first 24 hours after you arrive home or your discharge will be delayed. 9. Please wear clothes that are easy to get on and off and wear slip-on shoes.  Thank you for allowing [place] to care for you!   -- [facility]
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
## 2019-03-06 ENCOUNTER — Other Ambulatory Visit (HOSPITAL_COMMUNITY)
Admission: RE | Admit: 2019-03-06 | Discharge: 2019-03-06 | Disposition: A | Discharge status: Home / Self Care | Payer: Medicare Other | Source: Ambulatory Visit | Attending: Internal Medicine | Admitting: Internal Medicine

## 2019-03-06 ENCOUNTER — Other Ambulatory Visit (HOSPITAL_COMMUNITY): Payer: Self-pay

## 2019-03-06 DIAGNOSIS — Z20828 Contact with and (suspected) exposure to other viral communicable diseases: Secondary | ICD-10-CM | POA: Diagnosis not present

## 2019-03-06 DIAGNOSIS — Z01812 Encounter for preprocedural laboratory examination: Secondary | ICD-10-CM | POA: Insufficient documentation

## 2019-03-06 DIAGNOSIS — I5022 Chronic systolic (congestive) heart failure: Secondary | ICD-10-CM

## 2019-03-06 MED ORDER — SODIUM CHLORIDE 0.9% FLUSH: 3.0000 mL | Freq: Two times a day (BID) | INTRAVENOUS | Status: DC

## 2019-03-07 LAB — NOVEL CORONAVIRUS, NAA (HOSP ORDER, SEND-OUT TO REF LAB; TAT 18-24 HRS): SARS-CoV-2, NAA: NOT DETECTED

## 2019-03-10 ENCOUNTER — Other Ambulatory Visit: Payer: Self-pay

## 2019-03-10 ENCOUNTER — Encounter (HOSPITAL_COMMUNITY): Payer: Self-pay | Admitting: Internal Medicine

## 2019-03-10 ENCOUNTER — Encounter (HOSPITAL_COMMUNITY)
Admission: RE | Disposition: A | Discharge status: Home / Self Care | Payer: Self-pay | Source: Home / Self Care | Attending: Internal Medicine

## 2019-03-10 ENCOUNTER — Ambulatory Visit (HOSPITAL_COMMUNITY)
Admission: RE | Admit: 2019-03-10 | Discharge: 2019-03-10 | Disposition: A | Discharge status: Home / Self Care | Payer: Medicare Other | Attending: Internal Medicine | Admitting: Internal Medicine

## 2019-03-10 DIAGNOSIS — I110 Hypertensive heart disease with heart failure: Secondary | ICD-10-CM | POA: Insufficient documentation

## 2019-03-10 DIAGNOSIS — Z7982 Long term (current) use of aspirin: Secondary | ICD-10-CM | POA: Insufficient documentation

## 2019-03-10 DIAGNOSIS — Z6835 Body mass index (BMI) 35.0-35.9, adult: Secondary | ICD-10-CM | POA: Diagnosis not present

## 2019-03-10 DIAGNOSIS — Z79899 Other long term (current) drug therapy: Secondary | ICD-10-CM | POA: Diagnosis not present

## 2019-03-10 DIAGNOSIS — E669 Obesity, unspecified: Secondary | ICD-10-CM | POA: Diagnosis not present

## 2019-03-10 DIAGNOSIS — J45909 Unspecified asthma, uncomplicated: Secondary | ICD-10-CM | POA: Diagnosis not present

## 2019-03-10 DIAGNOSIS — Z7951 Long term (current) use of inhaled steroids: Secondary | ICD-10-CM | POA: Insufficient documentation

## 2019-03-10 DIAGNOSIS — E785 Hyperlipidemia, unspecified: Secondary | ICD-10-CM | POA: Diagnosis not present

## 2019-03-10 DIAGNOSIS — I428 Other cardiomyopathies: Secondary | ICD-10-CM | POA: Diagnosis not present

## 2019-03-10 DIAGNOSIS — I5022 Chronic systolic (congestive) heart failure: Secondary | ICD-10-CM

## 2019-03-10 HISTORY — PX: RIGHT/LEFT HEART CATH AND CORONARY ANGIOGRAPHY: CATH118266

## 2019-03-10 LAB — POCT I-STAT EG7
Acid-base deficit: 10 mmol/L — ABNORMAL HIGH (ref 0.0–2.0)
Acid-base deficit: 4 mmol/L — ABNORMAL HIGH (ref 0.0–2.0)
Bicarbonate: 16.9 mmol/L — ABNORMAL LOW (ref 20.0–28.0)
Bicarbonate: 22.4 mmol/L (ref 20.0–28.0)
Calcium, Ion: 0.62 mmol/L — CL (ref 1.15–1.40)
Calcium, Ion: 1.17 mmol/L (ref 1.15–1.40)
HCT: 44 % (ref 39.0–52.0)
HCT: 56 % — ABNORMAL HIGH (ref 39.0–52.0)
Hemoglobin: 15 g/dL (ref 13.0–17.0)
Hemoglobin: 19 g/dL — ABNORMAL HIGH (ref 13.0–17.0)
O2 Saturation: 71 %
O2 Saturation: 74 %
Potassium: 2.4 mmol/L — CL (ref 3.5–5.1)
Potassium: 3.9 mmol/L (ref 3.5–5.1)
Sodium: 138 mmol/L (ref 135–145)
Sodium: 139 mmol/L (ref 135–145)
TCO2: 18 mmol/L — ABNORMAL LOW (ref 22–32)
TCO2: 24 mmol/L (ref 22–32)
pCO2, Ven: 38.9 mmHg — ABNORMAL LOW (ref 44.0–60.0)
pCO2, Ven: 46.3 mmHg (ref 44.0–60.0)
pH, Ven: 7.246 — ABNORMAL LOW (ref 7.250–7.430)
pH, Ven: 7.294 (ref 7.250–7.430)
pO2, Ven: 43 mmHg (ref 32.0–45.0)
pO2, Ven: 44 mmHg (ref 32.0–45.0)

## 2019-03-10 LAB — POCT I-STAT 7, (LYTES, BLD GAS, ICA,H+H)
Acid-base deficit: 5 mmol/L — ABNORMAL HIGH (ref 0.0–2.0)
Bicarbonate: 20.4 mmol/L (ref 20.0–28.0)
Calcium, Ion: 1.11 mmol/L — ABNORMAL LOW (ref 1.15–1.40)
HCT: 55 % — ABNORMAL HIGH (ref 39.0–52.0)
Hemoglobin: 18.7 g/dL — ABNORMAL HIGH (ref 13.0–17.0)
O2 Saturation: 99 %
Potassium: 3.9 mmol/L (ref 3.5–5.1)
Sodium: 141 mmol/L (ref 135–145)
TCO2: 22 mmol/L (ref 22–32)
pCO2 arterial: 38.4 mmHg (ref 32.0–48.0)
pH, Arterial: 7.334 — ABNORMAL LOW (ref 7.350–7.450)
pO2, Arterial: 122 mmHg — ABNORMAL HIGH (ref 83.0–108.0)

## 2019-03-10 SURGERY — RIGHT/LEFT HEART CATH AND CORONARY ANGIOGRAPHY
Anesthesia: LOCAL

## 2019-03-10 MED ORDER — FENTANYL CITRATE (PF) 100 MCG/2ML IJ SOLN
INTRAMUSCULAR | Status: AC
  Filled 2019-03-10: qty 2

## 2019-03-10 MED ORDER — HEPARIN SODIUM (PORCINE) 1000 UNIT/ML IJ SOLN
INTRAMUSCULAR | Status: DC | PRN
  Administered 2019-03-10: 6000 [IU] via INTRAVENOUS

## 2019-03-10 MED ORDER — HYDRALAZINE HCL 20 MG/ML IJ SOLN: 10.0000 mg | INTRAMUSCULAR | Status: DC | PRN

## 2019-03-10 MED ORDER — MIDAZOLAM HCL 2 MG/2ML IJ SOLN
INTRAMUSCULAR | Status: AC
  Filled 2019-03-10: qty 2

## 2019-03-10 MED ORDER — LIDOCAINE HCL (PF) 1 % IJ SOLN
INTRAMUSCULAR | Status: AC
  Filled 2019-03-10: qty 30

## 2019-03-10 MED ORDER — SODIUM CHLORIDE 0.9 % IV SOLN: INTRAVENOUS | Status: AC

## 2019-03-10 MED ORDER — SODIUM CHLORIDE 0.9% FLUSH: 3.0000 mL | INTRAVENOUS | Status: DC | PRN

## 2019-03-10 MED ORDER — ASPIRIN 81 MG PO CHEW: 81.0000 mg | CHEWABLE_TABLET | ORAL | Status: DC

## 2019-03-10 MED ORDER — LABETALOL HCL 5 MG/ML IV SOLN: 10.0000 mg | INTRAVENOUS | Status: DC | PRN

## 2019-03-10 MED ORDER — IOHEXOL 350 MG/ML SOLN
INTRAVENOUS | Status: DC | PRN
  Administered 2019-03-10: 55 mL

## 2019-03-10 MED ORDER — FENTANYL CITRATE (PF) 100 MCG/2ML IJ SOLN
INTRAMUSCULAR | Status: DC | PRN
  Administered 2019-03-10 (×2): 25 ug via INTRAVENOUS

## 2019-03-10 MED ORDER — VERAPAMIL HCL 2.5 MG/ML IV SOLN
INTRAVENOUS | Status: AC
  Filled 2019-03-10: qty 2

## 2019-03-10 MED ORDER — SODIUM CHLORIDE 0.9 % IV SOLN: 250.0000 mL | INTRAVENOUS | Status: DC | PRN

## 2019-03-10 MED ORDER — LIDOCAINE HCL (PF) 1 % IJ SOLN
INTRAMUSCULAR | Status: DC | PRN
  Administered 2019-03-10: 2 mL
  Administered 2019-03-10: 1 mL

## 2019-03-10 MED ORDER — ACETAMINOPHEN 325 MG PO TABS: 650.0000 mg | ORAL_TABLET | ORAL | Status: DC | PRN

## 2019-03-10 MED ORDER — HEPARIN SODIUM (PORCINE) 1000 UNIT/ML IJ SOLN
INTRAMUSCULAR | Status: AC
  Filled 2019-03-10: qty 1

## 2019-03-10 MED ORDER — MIDAZOLAM HCL 2 MG/2ML IJ SOLN
INTRAMUSCULAR | Status: DC | PRN
  Administered 2019-03-10: 1 mg via INTRAVENOUS
  Administered 2019-03-10: 2 mg via INTRAVENOUS

## 2019-03-10 MED ORDER — SODIUM CHLORIDE 0.9 % IV SOLN
INTRAVENOUS | Status: DC
  Administered 2019-03-10: 07:00:00 via INTRAVENOUS

## 2019-03-10 MED ORDER — HEPARIN (PORCINE) IN NACL 1000-0.9 UT/500ML-% IV SOLN
INTRAVENOUS | Status: DC | PRN
  Administered 2019-03-10: 500 mL

## 2019-03-10 MED ORDER — ONDANSETRON HCL 4 MG/2ML IJ SOLN: 4.0000 mg | Freq: Four times a day (QID) | INTRAMUSCULAR | Status: DC | PRN

## 2019-03-10 MED ORDER — VERAPAMIL HCL 2.5 MG/ML IV SOLN
INTRAVENOUS | Status: DC | PRN
  Administered 2019-03-10: 10:00:00 10 mL via INTRA_ARTERIAL

## 2019-03-10 MED ORDER — HEPARIN (PORCINE) IN NACL 1000-0.9 UT/500ML-% IV SOLN
INTRAVENOUS | Status: AC
  Filled 2019-03-10: qty 1000

## 2019-03-10 MED ORDER — SODIUM CHLORIDE 0.9% FLUSH: 3.0000 mL | Freq: Two times a day (BID) | INTRAVENOUS | Status: DC

## 2019-03-10 SURGICAL SUPPLY — 9 items
CATH 5FR JL3.5 JR4 ANG PIG MP (CATHETERS) ×2 IMPLANT
CATH BALLN WEDGE 5F 110CM (CATHETERS) ×2 IMPLANT
DEVICE RAD COMP TR BAND LRG (VASCULAR PRODUCTS) ×2 IMPLANT
GLIDESHEATH SLEND SS 6F .021 (SHEATH) ×2 IMPLANT
GUIDEWIRE INQWIRE 1.5J.035X260 (WIRE) ×1 IMPLANT
INQWIRE 1.5J .035X260CM (WIRE) ×2
PACK CARDIAC CATHETERIZATION (CUSTOM PROCEDURE TRAY) ×2 IMPLANT
SHEATH GLIDE SLENDER 4/5FR (SHEATH) ×2 IMPLANT
TRANSDUCER W/STOPCOCK (MISCELLANEOUS) ×2 IMPLANT

## 2019-03-10 NOTE — Discharge Instructions (Signed)
Radial Site Care  This sheet gives you information about how to care for yourself after your procedure. Your health care provider may also give you more specific instructions. If you have problems or questions, contact your health care provider. What can I expect after the procedure? After the procedure, it is common to have:  Bruising and tenderness at the catheter insertion area. Follow these instructions at home: Medicines  Take over-the-counter and prescription medicines only as told by your health care provider. Insertion site care  Follow instructions from your health care provider about how to take care of your insertion site. Make sure you: ? Wash your hands with soap and water before you change your bandage (dressing). If soap and water are not available, use hand sanitizer. ? Change your dressing as told by your health care provider. ? Leave stitches (sutures), skin glue, or adhesive strips in place. These skin closures may need to stay in place for 2 weeks or longer. If adhesive strip edges start to loosen and curl up, you may trim the loose edges. Do not remove adhesive strips completely unless your health care provider tells you to do that.  Check your insertion site every day for signs of infection. Check for: ? Redness, swelling, or pain. ? Fluid or blood. ? Pus or a bad smell. ? Warmth.  Do not take baths, swim, or use a hot tub until your health care provider approves.  You may shower 24-48 hours after the procedure, or as directed by your health care provider. ? Remove the dressing and gently wash the site with plain soap and water. ? Pat the area dry with a clean towel. ? Do not rub the site. That could cause bleeding.  Do not apply powder or lotion to the site. Activity   For 24 hours after the procedure, or as directed by your health care provider: ? Do not flex or bend the affected arm. ? Do not push or pull heavy objects with the affected arm. ? Do not  drive yourself home from the hospital or clinic. You may drive 24 hours after the procedure unless your health care provider tells you not to. ? Do not operate machinery or power tools.  Do not lift anything that is heavier than 10 lb (4.5 kg), or the limit that you are told, until your health care provider says that it is safe.  Ask your health care provider when it is okay to: ? Return to work or school. ? Resume usual physical activities or sports. ? Resume sexual activity. General instructions  If the catheter site starts to bleed, raise your arm and put firm pressure on the site. If the bleeding does not stop, get help right away. This is a medical emergency.  If you went home on the same day as your procedure, a responsible adult should be with you for the first 24 hours after you arrive home.  Keep all follow-up visits as told by your health care provider. This is important. Contact a health care provider if:  You have a fever.  You have redness, swelling, or yellow drainage around your insertion site. Get help right away if:  You have unusual pain at the radial site.  The catheter insertion area swells very fast.  The insertion area is bleeding, and the bleeding does not stop when you hold steady pressure on the area.  Your arm or hand becomes pale, cool, tingly, or numb. These symptoms may represent a serious problem   that is an emergency. Do not wait to see if the symptoms will go away. Get medical help right away. Call your local emergency services (911 in the [place] Do not drive yourself to the hospital. Summary  After the procedure, it is common to have bruising and tenderness at the site.  Follow instructions from your health care provider about how to take care of your radial site wound. Check the wound every day for signs of infection.  Do not lift anything that is heavier than 10 lb (4.5 kg), or the limit that you are told, until your health care provider says  that it is safe. This information is not intended to replace advice given to you by your health care provider. Make sure you discuss any questions you have with your health care provider. Document Released: 05/26/2010 Document Revised: 05/29/2017 Document Reviewed: 05/29/2017 Elsevier Patient Education  2020 Elsevier Inc[employer]  Moderate Conscious Sedation, Adult, Care After These instructions provide you with information about caring for yourself after your procedure. Your health care provider may also give you more specific instructions. Your treatment has been planned according to current medical practices, but problems sometimes occur. Call your health care provider if you have any problems or questions after your procedure. What can I expect after the procedure? After your procedure, it is common:  To feel sleepy for several hours.  To feel clumsy and have poor balance for several hours.  To have poor judgment for several hours.  To vomit if you eat too soon. Follow these instructions at home: For at least 24 hours after the procedure:   Do not: ? Participate in activities where you could fall or become injured. ? Drive. ? Use heavy machinery. ? Drink alcohol. ? Take sleeping pills or medicines that cause drowsiness. ? Make important decisions or sign legal documents. ? Take care of children on your own.  Rest. Eating and drinking  Follow the diet recommended by your health care provider.  If you vomit: ? Drink water, juice, or soup when you can drink without vomiting. ? Make sure you have little or no nausea before eating solid foods. General instructions  Have a responsible adult stay with you until you are awake and alert.  Take over-the-counter and prescription medicines only as told by your health care provider.  If you smoke, do not smoke without supervision.  Keep all follow-up visits as told by your health care provider. This is important. Contact a health care  provider if:  You keep feeling nauseous or you keep vomiting.  You feel light-headed.  You develop a rash.  You have a fever. Get help right away if:  You have trouble breathing. This information is not intended to replace advice given to you by your health care provider. Make sure you discuss any questions you have with your health care provider. Document Released: 02/11/2013 Document Revised: 04/05/2017 Document Reviewed: 08/13/2015 Elsevier Patient Education  2020 Elsevier Inc.  

## 2019-03-10 NOTE — Research (Signed)
[place] Informed Consent   Subject Name: Victor [name]  Subject met inclusion and exclusion criteria.  The informed consent form, study requirements and expectations were reviewed with the subject and questions and concerns were addressed prior to the signing of the consent form.  The subject verbalized understanding of the trial requirements.  The subject agreed to participate in the [facility] trial and signed the informed consent at [place] on 03/10/2019.  The informed consent was obtained prior to performance of any protocol-specific procedures for the subject.  A copy of the signed informed consent was given to the subject and a copy was placed in the subject's medical record.   Victor [name]

## 2019-03-10 NOTE — Interval H&P Note (Signed)
History and Physical Interval Note:  03/10/2019 8:28 AM  Victor [name]  has presented today for surgery, with the diagnosis of heart failure.  The various methods of treatment have been discussed with the patient and family. After consideration of risks, benefits and other options for treatment, the patient has consented to  Procedure(s): RIGHT/LEFT HEART CATH AND CORONARY ANGIOGRAPHY (N/A) and possible coronary angioplasty as a surgical intervention.  The patient's history has been reviewed, patient examined, no change in status, stable for surgery.  I have reviewed the patient's chart and labs.  Questions were answered to the patient's satisfaction.     Daniel Bensimhon

## 2019-03-13 DIAGNOSIS — S82424A Nondisplaced transverse fracture of shaft of right fibula, initial encounter for closed fracture: Secondary | ICD-10-CM | POA: Diagnosis not present

## 2019-03-13 DIAGNOSIS — M25571 Pain in right ankle and joints of right foot: Secondary | ICD-10-CM | POA: Diagnosis not present

## 2019-03-16 ENCOUNTER — Telehealth: Payer: Self-pay | Admitting: Interventional Cardiology

## 2019-03-16 NOTE — Telephone Encounter (Signed)
°*  STAT* If patient is at the pharmacy, call can be transferred to refill team.   1. Which medications need to be refilled? (please list name of each medication and dose if known) furosemide (LASIX) 40 MG tablet  2. Which pharmacy/location (including street and city if local pharmacy) is medication to be sent to? [facility] [place], [place] AT Portal to Registered [employer] Sites  3. Do they need a 30 day or 90 day supply? 90 day

## 2019-03-17 ENCOUNTER — Other Ambulatory Visit: Payer: Self-pay | Admitting: *Deleted

## 2019-03-17 MED ORDER — FUROSEMIDE 40 MG PO TABS: 40.0000 mg | ORAL_TABLET | Freq: Every day | ORAL | 3 refills | Status: DC

## 2019-03-17 NOTE — Telephone Encounter (Signed)
Outpatient Medication Detail   Disp Refills Start End   furosemide (LASIX) 40 MG tablet 135 tablet 3 03/17/2019    Sig - Route: Take 1 tablet (40 mg total) by mouth daily. You may take one extra tablet by mouth as needed for shortness of breath - Oral   Sent to pharmacy as: furosemide (LASIX) 40 MG tablet   E-Prescribing Status: Receipt confirmed by pharmacy (03/17/2019 9:47 AM EST)   Pharmacy  [facility] [place], [place]

## 2019-03-25 ENCOUNTER — Other Ambulatory Visit: Payer: Self-pay | Admitting: Registered Nurse

## 2019-03-30 ENCOUNTER — Other Ambulatory Visit: Payer: Self-pay | Admitting: *Deleted

## 2019-03-30 NOTE — Telephone Encounter (Signed)
Prior authorization submitted to covermymeds for lidocaine ointment 5%.  Approved 12/30/2018-06/28/2019

## 2019-04-14 ENCOUNTER — Other Ambulatory Visit: Payer: Self-pay | Admitting: Physical Medicine & Rehabilitation

## 2019-04-14 DIAGNOSIS — F418 Other specified anxiety disorders: Secondary | ICD-10-CM

## 2019-04-15 ENCOUNTER — Other Ambulatory Visit: Payer: Self-pay | Admitting: Physical Medicine & Rehabilitation

## 2019-04-15 NOTE — Telephone Encounter (Signed)
Recieved request for refill of controlled medication.  Due to recent change in rules this medication can no longer be called into a pharmacy but must be E-scribed.

## 2019-04-15 NOTE — Telephone Encounter (Signed)
Med RF'ed 

## 2019-04-15 NOTE — Telephone Encounter (Signed)
Med rf'ed thx

## 2019-04-24 ENCOUNTER — Encounter: Payer: Self-pay | Admitting: Registered Nurse

## 2019-04-24 ENCOUNTER — Encounter (HOSPITAL_COMMUNITY): Payer: Self-pay | Admitting: Internal Medicine

## 2019-04-24 ENCOUNTER — Encounter: Payer: Medicare Other | Attending: Physical Medicine and Rehabilitation | Admitting: Registered Nurse

## 2019-04-24 ENCOUNTER — Telehealth (HOSPITAL_COMMUNITY): Payer: Self-pay

## 2019-04-24 ENCOUNTER — Ambulatory Visit (HOSPITAL_COMMUNITY)
Admission: RE | Admit: 2019-04-24 | Discharge: 2019-04-24 | Disposition: A | Discharge status: Home / Self Care | Payer: Medicare Other | Source: Ambulatory Visit | Attending: Internal Medicine | Admitting: Internal Medicine

## 2019-04-24 ENCOUNTER — Other Ambulatory Visit: Payer: Self-pay

## 2019-04-24 VITALS — BP 101/71 | HR 76 | Temp 97.7°F | Ht 73.0 in | Wt 272.0 lb

## 2019-04-24 VITALS — BP 104/62 | HR 78 | Wt 271.3 lb

## 2019-04-24 DIAGNOSIS — I5022 Chronic systolic (congestive) heart failure: Secondary | ICD-10-CM | POA: Diagnosis not present

## 2019-04-24 DIAGNOSIS — M7522 Bicipital tendinitis, left shoulder: Secondary | ICD-10-CM | POA: Insufficient documentation

## 2019-04-24 DIAGNOSIS — I110 Hypertensive heart disease with heart failure: Secondary | ICD-10-CM | POA: Diagnosis not present

## 2019-04-24 DIAGNOSIS — Z5181 Encounter for therapeutic drug level monitoring: Secondary | ICD-10-CM | POA: Diagnosis not present

## 2019-04-24 DIAGNOSIS — M1732 Unilateral post-traumatic osteoarthritis, left knee: Secondary | ICD-10-CM | POA: Diagnosis present

## 2019-04-24 DIAGNOSIS — E669 Obesity, unspecified: Secondary | ICD-10-CM | POA: Diagnosis not present

## 2019-04-24 DIAGNOSIS — R0683 Snoring: Secondary | ICD-10-CM | POA: Insufficient documentation

## 2019-04-24 DIAGNOSIS — Z7982 Long term (current) use of aspirin: Secondary | ICD-10-CM | POA: Diagnosis not present

## 2019-04-24 DIAGNOSIS — Z7951 Long term (current) use of inhaled steroids: Secondary | ICD-10-CM | POA: Insufficient documentation

## 2019-04-24 DIAGNOSIS — Z79899 Other long term (current) drug therapy: Secondary | ICD-10-CM | POA: Diagnosis not present

## 2019-04-24 DIAGNOSIS — M86669 Other chronic osteomyelitis, unspecified tibia and fibula: Secondary | ICD-10-CM | POA: Insufficient documentation

## 2019-04-24 DIAGNOSIS — J45909 Unspecified asthma, uncomplicated: Secondary | ICD-10-CM | POA: Insufficient documentation

## 2019-04-24 DIAGNOSIS — S8411XA Injury of peroneal nerve at lower leg level, right leg, initial encounter: Secondary | ICD-10-CM | POA: Diagnosis present

## 2019-04-24 DIAGNOSIS — I10 Essential (primary) hypertension: Secondary | ICD-10-CM

## 2019-04-24 DIAGNOSIS — G894 Chronic pain syndrome: Secondary | ICD-10-CM | POA: Insufficient documentation

## 2019-04-24 DIAGNOSIS — S8412XS Injury of peroneal nerve at lower leg level, left leg, sequela: Secondary | ICD-10-CM | POA: Diagnosis present

## 2019-04-24 DIAGNOSIS — I428 Other cardiomyopathies: Secondary | ICD-10-CM | POA: Diagnosis not present

## 2019-04-24 DIAGNOSIS — I447 Left bundle-branch block, unspecified: Secondary | ICD-10-CM | POA: Insufficient documentation

## 2019-04-24 DIAGNOSIS — M722 Plantar fascial fibromatosis: Secondary | ICD-10-CM | POA: Insufficient documentation

## 2019-04-24 DIAGNOSIS — I5043 Acute on chronic combined systolic (congestive) and diastolic (congestive) heart failure: Secondary | ICD-10-CM

## 2019-04-24 DIAGNOSIS — Z79891 Long term (current) use of opiate analgesic: Secondary | ICD-10-CM

## 2019-04-24 LAB — BASIC METABOLIC PANEL
Anion gap: 14 (ref 5–15)
BUN: 16 mg/dL (ref 6–20)
CO2: 21 mmol/L — ABNORMAL LOW (ref 22–32)
Calcium: 9.3 mg/dL (ref 8.9–10.3)
Chloride: 102 mmol/L (ref 98–111)
Creatinine, Ser: 1.24 mg/dL (ref 0.61–1.24)
GFR calc Af Amer: >60 mL/min (ref >60)
GFR calc non Af Amer: >60 mL/min (ref >60)
Glucose, Bld: 116 mg/dL — ABNORMAL HIGH (ref 70–99)
Potassium: 3.9 mmol/L (ref 3.5–5.1)
Sodium: 137 mmol/L (ref 135–145)

## 2019-04-24 LAB — BRAIN NATRIURETIC PEPTIDE: B Natriuretic Peptide: 96.1 pg/mL (ref 0.0–100.0)

## 2019-04-24 MED ORDER — OXYCODONE HCL 15 MG PO TABS: 15.0000 mg | ORAL_TABLET | Freq: Three times a day (TID) | ORAL | 0 refills | Status: DC | PRN

## 2019-04-24 MED ORDER — DAPAGLIFLOZIN PROPANEDIOL 10 MG PO TABS: 10.0000 mg | ORAL_TABLET | Freq: Every day | ORAL | 3 refills | Status: DC

## 2019-04-24 NOTE — Addendum Note (Signed)
Encounter addended by: Taffany Heiser, Octavia Heir, CMA on: 04/24/2019 10:43 AM  Actions taken: Order list changed, Diagnosis association updated, Charge Capture section accepted
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
## 2019-04-24 NOTE — Telephone Encounter (Signed)
Faxed ov notes, demographics, stop bang and order to [facility] @ [phone].

## 2019-04-24 NOTE — Addendum Note (Signed)
Encounter addended by: Keiron Iodice, Octavia Heir, CMA on: 04/24/2019 10:40 AM  Actions taken: Pharmacy for encounter modified, Order list changed, Diagnosis association updated, Clinical Note Signed
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
## 2019-04-24 NOTE — Telephone Encounter (Signed)
Height: 6 ft    Weight:271.0   BMI:123.4  Today's Date:04/24/2019  STOP BANG RISK ASSESSMENT S (snore) Have you been told that you snore?     YES   T (tired) Are you often tired, fatigued, or sleepy during the day?   YES  O (obstruction) Do you stop breathing, choke, or gasp during sleep? NO   P (pressure) Do you have or are you being treated for high blood pressure? YES   B (BMI) Is your body index greater than 35 kg/m? YES   A (age) Are you 57 years old or older? YES   N (neck) Do you have a neck circumference greater than 16 inches?   NO   G (gender) Are you a male? YES   TOTAL STOP/BANG "YES" ANSWERS 6                                                                       For Office Use Only              Procedure Order Form    YES to 3+ Stop Bang questions OR two clinical symptoms - patient qualifies for WatchPAT (CPT 95800)      Clinical Notes: Will consult Sleep Specialist and refer for management of therapy due to patient increased risk of Sleep Apnea. Ordering a sleep study due to the following two clinical symptoms: Excessive daytime sleepiness G47.10 / Loud snoring / History of high blood pressure R03.0 / Insomnia G47.00   Which test do you need, WP1 or WP300???  . Do you have access to a smart device containing the app stores? yes  If YES, then -->WP1

## 2019-04-24 NOTE — Patient Instructions (Signed)
Your Provider has asked that you complete a home [facility] Sleep Study. Please be on the lookout for an 800 number to contact you to send the device for the sleep study.  Medications:   Added FARXIGA 10mg  - Take 1 tablet daily    Labs were done today. We will only call you if there are any Abnormalities. No Call Is A Good Call!!    At the [facility], you and your health needs are our priority. As part of our continuing mission to provide you with exceptional heart care, we have created designated Provider Care Teams. These Care Teams include your primary Cardiologist (physician) and Advanced Practice Providers (APPs- Physician Assistants and Nurse Practitioners) who all work together to provide you with the care you need, when you need it.   You may see any of the following providers on your designated Care Team at your next follow up: Marland Kitchen Dr Glori Bickers . Dr Loralie Champagne . Darrick Grinder, NP . Lyda Jester, PA . Audry Riles, PharmD   Please be sure to bring in all your medications bottles to every appointment.

## 2019-04-29 ENCOUNTER — Telehealth: Payer: Self-pay | Admitting: *Deleted

## 2019-04-29 NOTE — Telephone Encounter (Addendum)
Victor [name] has called about his DOT forms that were faxed to our office, asking if Victor [name] has filled them out.  I looked at the forms and they have Victor [name] name on them and it is a DOT form for driving while taking medication.  It is for the oxycodone and alprazolam, and Victor [name] is not prescribing the alprazolam. I have informed him it will have to be filled out by Victor [name]. His appt with DOT was Monday. He will have to reschedule that appointment. The forms will be in Victor. [name] folder to be signed on Wednesday.

## 2019-05-04 ENCOUNTER — Other Ambulatory Visit: Payer: Self-pay

## 2019-05-04 ENCOUNTER — Other Ambulatory Visit: Payer: Medicare Other | Admitting: *Deleted

## 2019-05-04 DIAGNOSIS — E782 Mixed hyperlipidemia: Secondary | ICD-10-CM

## 2019-05-04 LAB — HEPATIC FUNCTION PANEL
ALT: 21 IU/L (ref 0–44)
AST: 24 IU/L (ref 0–40)
Albumin: 4.6 g/dL (ref 3.8–4.9)
Alkaline Phosphatase: 54 IU/L (ref 39–117)
Bilirubin Total: 1.6 mg/dL — ABNORMAL HIGH (ref 0.0–1.2)
Bilirubin, Direct: 0.36 mg/dL (ref 0.00–0.40)
Total Protein: 6.9 g/dL (ref 6.0–8.5)

## 2019-05-04 LAB — LIPID PANEL
Chol/HDL Ratio: 3.4 ratio (ref 0.0–5.0)
Cholesterol, Total: 139 mg/dL (ref 100–199)
HDL: 41 mg/dL (ref >39)
LDL Chol Calc (NIH): 71 mg/dL (ref 0–99)
Triglycerides: 159 mg/dL — ABNORMAL HIGH (ref 0–149)
VLDL Cholesterol Cal: 27 mg/dL (ref 5–40)
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
## 2019-06-03 ENCOUNTER — Encounter: Payer: Self-pay | Admitting: Internal Medicine

## 2019-06-03 ENCOUNTER — Ambulatory Visit (INDEPENDENT_AMBULATORY_CARE_PROVIDER_SITE_OTHER): Payer: Medicare Other | Admitting: Internal Medicine

## 2019-06-03 ENCOUNTER — Other Ambulatory Visit: Payer: Self-pay

## 2019-06-03 VITALS — BP 100/68 | HR 86 | Ht 73.0 in | Wt 267.8 lb

## 2019-06-03 DIAGNOSIS — I428 Other cardiomyopathies: Secondary | ICD-10-CM | POA: Diagnosis not present

## 2019-06-03 DIAGNOSIS — I5022 Chronic systolic (congestive) heart failure: Secondary | ICD-10-CM | POA: Diagnosis not present

## 2019-06-03 NOTE — Patient Instructions (Signed)
Medication Instructions:  Your physician recommends that you continue on your current medications as directed. Please refer to the Current Medication list given to you today.   *If you need a refill on your cardiac medications before your next appointment, please call your pharmacy*  Lab Work: None ordered.  If you have labs (blood work) drawn today and your tests are completely normal, you will receive your results only by: Marland Kitchen MyChart Message (if you have MyChart) OR . A paper copy in the mail If you have any lab test that is abnormal or we need to change your treatment, we will call you to review the results.  Testing/Procedures: None ordered.    Follow-Up: At [facility], you and your health needs are our priority.  As part of our continuing mission to provide you with exceptional heart care, we have created designated Provider Care Teams.  These Care Teams include your primary Cardiologist (physician) and Advanced Practice Providers (APPs -  Physician Assistants and Nurse Practitioners) who all work together to provide you with the care you need, when you need it.  Your next appointment:  Follow up with Dr Caryl Comes as needed.

## 2019-06-09 NOTE — Addendum Note (Signed)
Addended by: Rose Phi on: 06/09/2019 05:09 PM   Modules accepted: Orders

## 2019-06-13 ENCOUNTER — Other Ambulatory Visit: Payer: Self-pay | Admitting: Physical Medicine & Rehabilitation

## 2019-06-13 DIAGNOSIS — F418 Other specified anxiety disorders: Secondary | ICD-10-CM

## 2019-06-19 ENCOUNTER — Ambulatory Visit: Payer: Medicare Other | Admitting: Registered Nurse

## 2019-06-19 ENCOUNTER — Other Ambulatory Visit: Payer: Self-pay

## 2019-06-19 ENCOUNTER — Encounter: Payer: Medicare Other | Attending: Physical Medicine and Rehabilitation | Admitting: Registered Nurse

## 2019-06-19 ENCOUNTER — Encounter: Payer: Self-pay | Admitting: Registered Nurse

## 2019-06-19 VITALS — BP 101/72 | HR 90 | Temp 97.2°F | Ht 73.0 in | Wt 274.0 lb

## 2019-06-19 DIAGNOSIS — G894 Chronic pain syndrome: Secondary | ICD-10-CM | POA: Diagnosis present

## 2019-06-19 DIAGNOSIS — M1732 Unilateral post-traumatic osteoarthritis, left knee: Secondary | ICD-10-CM | POA: Diagnosis present

## 2019-06-19 DIAGNOSIS — M722 Plantar fascial fibromatosis: Secondary | ICD-10-CM | POA: Insufficient documentation

## 2019-06-19 DIAGNOSIS — Z79891 Long term (current) use of opiate analgesic: Secondary | ICD-10-CM

## 2019-06-19 DIAGNOSIS — S8412XS Injury of peroneal nerve at lower leg level, left leg, sequela: Secondary | ICD-10-CM

## 2019-06-19 DIAGNOSIS — S8411XA Injury of peroneal nerve at lower leg level, right leg, initial encounter: Secondary | ICD-10-CM | POA: Diagnosis present

## 2019-06-19 DIAGNOSIS — M7522 Bicipital tendinitis, left shoulder: Secondary | ICD-10-CM | POA: Insufficient documentation

## 2019-06-19 DIAGNOSIS — Z5181 Encounter for therapeutic drug level monitoring: Secondary | ICD-10-CM | POA: Diagnosis not present

## 2019-06-19 MED ORDER — OXYCODONE HCL 15 MG PO TABS: 15.0000 mg | ORAL_TABLET | Freq: Three times a day (TID) | ORAL | 0 refills | Status: DC | PRN

## 2019-06-19 MED ORDER — PREGABALIN 150 MG PO CAPS: ORAL_CAPSULE | ORAL | 2 refills | Status: DC
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
## 2019-06-26 ENCOUNTER — Telehealth: Payer: Self-pay

## 2019-06-26 NOTE — Telephone Encounter (Signed)
DEA number provided

## 2019-06-26 NOTE — Telephone Encounter (Signed)
Alania from [place] called about Lyrica. Needs the supervising doctor Alger [facility] #. Ph# [phone] Ref# [id number]

## 2019-07-23 ENCOUNTER — Encounter (HOSPITAL_COMMUNITY): Payer: Self-pay | Admitting: Internal Medicine

## 2019-07-23 ENCOUNTER — Other Ambulatory Visit: Payer: Self-pay

## 2019-07-23 ENCOUNTER — Ambulatory Visit (HOSPITAL_COMMUNITY)
Admission: RE | Admit: 2019-07-23 | Discharge: 2019-07-23 | Disposition: A | Discharge status: Home / Self Care | Payer: Medicare Other | Source: Ambulatory Visit | Attending: Internal Medicine | Admitting: Internal Medicine

## 2019-07-23 VITALS — BP 114/84 | HR 86 | Wt 270.6 lb

## 2019-07-23 DIAGNOSIS — I447 Left bundle-branch block, unspecified: Secondary | ICD-10-CM | POA: Diagnosis not present

## 2019-07-23 DIAGNOSIS — I5022 Chronic systolic (congestive) heart failure: Secondary | ICD-10-CM | POA: Diagnosis not present

## 2019-07-23 DIAGNOSIS — M8668 Other chronic osteomyelitis, other site: Secondary | ICD-10-CM | POA: Insufficient documentation

## 2019-07-23 DIAGNOSIS — R0683 Snoring: Secondary | ICD-10-CM | POA: Insufficient documentation

## 2019-07-23 DIAGNOSIS — E669 Obesity, unspecified: Secondary | ICD-10-CM | POA: Insufficient documentation

## 2019-07-23 DIAGNOSIS — I110 Hypertensive heart disease with heart failure: Secondary | ICD-10-CM | POA: Diagnosis not present

## 2019-07-23 DIAGNOSIS — J45909 Unspecified asthma, uncomplicated: Secondary | ICD-10-CM | POA: Diagnosis not present

## 2019-07-23 DIAGNOSIS — I429 Cardiomyopathy, unspecified: Secondary | ICD-10-CM | POA: Insufficient documentation

## 2019-07-23 DIAGNOSIS — Z79899 Other long term (current) drug therapy: Secondary | ICD-10-CM | POA: Diagnosis not present

## 2019-07-23 DIAGNOSIS — Z7984 Long term (current) use of oral hypoglycemic drugs: Secondary | ICD-10-CM | POA: Diagnosis not present

## 2019-07-23 DIAGNOSIS — Z7982 Long term (current) use of aspirin: Secondary | ICD-10-CM | POA: Diagnosis not present

## 2019-07-23 DIAGNOSIS — I428 Other cardiomyopathies: Secondary | ICD-10-CM | POA: Insufficient documentation

## 2019-07-23 DIAGNOSIS — I10 Essential (primary) hypertension: Secondary | ICD-10-CM | POA: Diagnosis not present

## 2019-07-23 DIAGNOSIS — Z7901 Long term (current) use of anticoagulants: Secondary | ICD-10-CM | POA: Diagnosis not present

## 2019-07-23 LAB — BASIC METABOLIC PANEL
Anion gap: 14 (ref 5–15)
BUN: 13 mg/dL (ref 6–20)
CO2: 21 mmol/L — ABNORMAL LOW (ref 22–32)
Calcium: 10 mg/dL (ref 8.9–10.3)
Chloride: 105 mmol/L (ref 98–111)
Creatinine, Ser: 1.07 mg/dL (ref 0.61–1.24)
GFR calc Af Amer: >60 mL/min (ref >60)
GFR calc non Af Amer: >60 mL/min (ref >60)
Glucose, Bld: 103 mg/dL — ABNORMAL HIGH (ref 70–99)
Potassium: 4.1 mmol/L (ref 3.5–5.1)
Sodium: 140 mmol/L (ref 135–145)

## 2019-07-23 LAB — BRAIN NATRIURETIC PEPTIDE: B Natriuretic Peptide: 168.7 pg/mL — ABNORMAL HIGH (ref 0.0–100.0)

## 2019-07-23 MED ORDER — ENTRESTO 49-51 MG PO TABS: 1.0000 | ORAL_TABLET | Freq: Two times a day (BID) | ORAL | 3 refills | Status: DC

## 2019-07-23 NOTE — Addendum Note (Signed)
Encounter addended by: Scarlette Calico, RN on: 07/23/2019 11:36 AM  Actions taken: Charge Capture section accepted, Clinical Note Signed, Pharmacy for encounter modified, Order list changed, Diagnosis association updated

## 2019-07-23 NOTE — Progress Notes (Signed)
Pt brought in his [facility] device with him today, Dr [name] discussed importance of completing the test and pt is agreeable, he states he will complete asap.

## 2019-07-23 NOTE — Addendum Note (Signed)
Encounter addended by: Scarlette Calico, RN on: 07/23/2019 12:00 PM  Actions taken: Clinical Note Signed
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
## 2019-07-23 NOTE — Patient Instructions (Signed)
Increase Entresto to 49/51 mg Twice daily   Labs done today, your results will be available in MyChart, we will contact you for abnormal readings.  Your physician recommends that you schedule a follow-up appointment in: 2 months with echocardiogram  If you have any questions or concerns before your next appointment please send [place] a message through [place] or call our office at [phone].  At the [facility], you and your health needs are our priority. As part of our continuing mission to provide you with exceptional heart care, we have created designated Provider Care Teams. These Care Teams include your primary Cardiologist (physician) and Advanced Practice Providers (APPs- Physician Assistants and Nurse Practitioners) who all work together to provide you with the care you need, when you need it.   You may see any of the following providers on your designated Care Team at your next follow up: Marland Kitchen Dr Glori Bickers . Dr Loralie Champagne . Darrick Grinder, NP . Lyda Jester, PA . Audry Riles, PharmD   Please be sure to bring in all your medications bottles to every appointment.
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
## 2019-08-17 ENCOUNTER — Other Ambulatory Visit (HOSPITAL_COMMUNITY): Payer: Self-pay | Admitting: Internal Medicine

## 2019-08-18 ENCOUNTER — Encounter: Payer: Self-pay | Admitting: Interventional Cardiology

## 2019-08-18 ENCOUNTER — Other Ambulatory Visit: Payer: Self-pay

## 2019-08-18 ENCOUNTER — Ambulatory Visit (INDEPENDENT_AMBULATORY_CARE_PROVIDER_SITE_OTHER): Payer: Medicare Other | Admitting: Interventional Cardiology

## 2019-08-18 VITALS — BP 112/82 | HR 89 | Ht 73.0 in | Wt 275.0 lb

## 2019-08-18 DIAGNOSIS — I447 Left bundle-branch block, unspecified: Secondary | ICD-10-CM | POA: Diagnosis not present

## 2019-08-18 DIAGNOSIS — I10 Essential (primary) hypertension: Secondary | ICD-10-CM | POA: Diagnosis not present

## 2019-08-18 DIAGNOSIS — E782 Mixed hyperlipidemia: Secondary | ICD-10-CM

## 2019-08-18 DIAGNOSIS — I5022 Chronic systolic (congestive) heart failure: Secondary | ICD-10-CM

## 2019-08-18 DIAGNOSIS — I420 Dilated cardiomyopathy: Secondary | ICD-10-CM

## 2019-08-18 NOTE — Patient Instructions (Signed)

## 2019-08-19 ENCOUNTER — Ambulatory Visit: Payer: Medicare Other | Admitting: Registered Nurse

## 2019-08-21 ENCOUNTER — Encounter: Payer: Self-pay | Admitting: Registered Nurse

## 2019-08-21 ENCOUNTER — Encounter: Payer: Medicare Other | Attending: Physical Medicine and Rehabilitation | Admitting: Registered Nurse

## 2019-08-21 ENCOUNTER — Other Ambulatory Visit: Payer: Self-pay

## 2019-08-21 VITALS — BP 103/69 | HR 77 | Ht 73.0 in | Wt 274.6 lb

## 2019-08-21 DIAGNOSIS — S8411XA Injury of peroneal nerve at lower leg level, right leg, initial encounter: Secondary | ICD-10-CM | POA: Diagnosis present

## 2019-08-21 DIAGNOSIS — M1732 Unilateral post-traumatic osteoarthritis, left knee: Secondary | ICD-10-CM | POA: Diagnosis present

## 2019-08-21 DIAGNOSIS — F418 Other specified anxiety disorders: Secondary | ICD-10-CM | POA: Diagnosis not present

## 2019-08-21 DIAGNOSIS — G8929 Other chronic pain: Secondary | ICD-10-CM

## 2019-08-21 DIAGNOSIS — M25562 Pain in left knee: Secondary | ICD-10-CM

## 2019-08-21 DIAGNOSIS — S8412XS Injury of peroneal nerve at lower leg level, left leg, sequela: Secondary | ICD-10-CM | POA: Diagnosis present

## 2019-08-21 DIAGNOSIS — M722 Plantar fascial fibromatosis: Secondary | ICD-10-CM | POA: Insufficient documentation

## 2019-08-21 DIAGNOSIS — M7522 Bicipital tendinitis, left shoulder: Secondary | ICD-10-CM | POA: Diagnosis present

## 2019-08-21 DIAGNOSIS — G894 Chronic pain syndrome: Secondary | ICD-10-CM | POA: Diagnosis present

## 2019-08-21 DIAGNOSIS — Z79891 Long term (current) use of opiate analgesic: Secondary | ICD-10-CM

## 2019-08-21 DIAGNOSIS — Z5181 Encounter for therapeutic drug level monitoring: Secondary | ICD-10-CM | POA: Diagnosis not present

## 2019-08-21 MED ORDER — ALPRAZOLAM 0.5 MG PO TABS: 0.5000 mg | ORAL_TABLET | Freq: Every day | ORAL | 2 refills | Status: DC | PRN

## 2019-08-21 MED ORDER — OXYCODONE HCL 15 MG PO TABS: 15.0000 mg | ORAL_TABLET | Freq: Three times a day (TID) | ORAL | 0 refills | Status: DC | PRN
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
## 2019-09-08 ENCOUNTER — Encounter (INDEPENDENT_AMBULATORY_CARE_PROVIDER_SITE_OTHER): Payer: Medicare Other | Admitting: Cardiology

## 2019-09-08 DIAGNOSIS — G4733 Obstructive sleep apnea (adult) (pediatric): Secondary | ICD-10-CM

## 2019-09-09 NOTE — Procedures (Signed)
    Sleep Study Report  Patient Information Name: Victor [name]  ID: [id number] Birth Date: 1961/03/15  Age: 59  Gender: Male BMI: 36.7 (W=271 lb, H=6' 0'') Study Date:09/08/2019 Referring Physician: Glori Bickers, MD  TEST DESCRIPTION: Home sleep apnea testing was completed using the WatchPat, a Type 1 device, utilizing peripheral arterial tonometry (PAT), chest movement, actigraphy, pulse oximetry, pulse rate, body position and snore.AHI was calculated with apnea and hypopnea using valid sleep time as the denominator. RDI includes apneas,hypopneas, and RERAs. The data acquired and the scoring of sleep and all associated events were performed in accordance with the recommended standards and specifications as outlined in the AASM Manual for the Scoring of Sleep and Associated Events 2.2.0 (2015).  FINDINGS: 1. Mild Obstructive Sleep Apnea with AHI 5.3/hr. 2. No Central Sleep Apnea with pAHIc 0/hr. 3. Oxygen desaturations as low as 88%. 4. Mild snoring was present. O2 sats were < 88% for 3minutes. 5. Total sleep time was 4hrs and 1 min. 6. 21% of total sleep time was spent in the supine position. 7. Prolonged sleep onset latency at 70 min. 8. Prolonged REM sleep onset latency at 188 min. 9. Total awakenings were 12.  DIAGNOSIS: Mild Obstructive Sleep Apnea (G47.33)  Recommendations 1. Clinical correlation of these findings is necessary. The decision to treat obstructive sleep apnea (OSA) is usually based on the presence of apnea symptoms or the presence of associated medical conditions such as Hypertension, Congestive Heart Failure, Atrial Fibrillation or Obesity. The most common symptoms of OSA are snoring, gasping for breath while sleeping, daytime sleepiness and fatigue.  2. Initiating apnea therapy is recommended given the presence of symptoms and/or associated conditions.  Recommend proceeding with one of the following: a. Auto-CPAP therapy with a pressure range of 5-20cm  H2O.  b. An oral appliance (OA) that can be obtained from certain dentists with expertise in sleep medicine. These are primarily of use in non-obese patients with mild and moderate disease.  c. An ENT consultation which may be useful to look for specific causes of obstruction and possible treatment Options.  d. If patient is intolerant to PAP therapy, consider referral to ENT for evaluation for hypoglossal nerve stimulator .  3. Close follow-up is necessary to ensure success with CPAP or oral appliance therapy for maximum benefit .  4. A follow-up oximetry study on CPAP is recommended to assess the adequacy of therapy and determine the need for supplemental oxygen or the potential need for Bi-level therapy. An arterial blood gas to determine the adequacy of baseline ventilation and oxygenation should also be considered.  5. Healthy sleep recommendations include: adequate nightly sleep (normal 7-9 hrs/night),  avoidance of caffeine afternoon and alcohol near bedtime, and maintaining a sleep environment that is cool, dark and quiet.  6. Weight loss for overweight patients is recommended. Even modest amounts of weight loss can significantly improve the severity of sleep apnea.  7. Snoring recommendations include: weight loss where appropriate, side sleeping, and  avoidance of alcohol beforeBed.  8. Operation of motor vehicle or dangerous equipment must be avoided when feeling drowsy, excessively sleepy, or mentally fatigued.  Report prepared by: Signature: Fransico Him, MD [facility], Diplomate of ABSM  Electronically Signed: Sep 09, 2019

## 2019-09-10 ENCOUNTER — Ambulatory Visit: Payer: Medicare Other

## 2019-09-10 ENCOUNTER — Other Ambulatory Visit: Payer: Self-pay

## 2019-09-10 DIAGNOSIS — R0683 Snoring: Secondary | ICD-10-CM

## 2019-09-11 ENCOUNTER — Other Ambulatory Visit (HOSPITAL_COMMUNITY): Payer: Self-pay | Admitting: Internal Medicine

## 2019-09-22 ENCOUNTER — Other Ambulatory Visit (HOSPITAL_COMMUNITY): Payer: Medicare Other

## 2019-09-22 ENCOUNTER — Encounter (HOSPITAL_COMMUNITY): Payer: Medicare Other | Admitting: Internal Medicine

## 2019-09-25 ENCOUNTER — Telehealth: Payer: Self-pay | Admitting: *Deleted

## 2019-09-25 ENCOUNTER — Encounter: Payer: Self-pay | Admitting: Cardiology

## 2019-09-25 ENCOUNTER — Telehealth (INDEPENDENT_AMBULATORY_CARE_PROVIDER_SITE_OTHER): Payer: Medicare Other | Admitting: Cardiology

## 2019-09-25 VITALS — BP 121/82 | Ht 73.0 in | Wt 265.0 lb

## 2019-09-25 DIAGNOSIS — I10 Essential (primary) hypertension: Secondary | ICD-10-CM | POA: Diagnosis not present

## 2019-09-25 DIAGNOSIS — G4733 Obstructive sleep apnea (adult) (pediatric): Secondary | ICD-10-CM

## 2019-09-25 NOTE — Telephone Encounter (Signed)
  Patient Consent for Virtual Visit         Victor [name] has provided verbal consent on 09/25/2019 for a virtual visit (video or telephone).   CONSENT FOR VIRTUAL VISIT FOR:  Victor [name]  By participating in this virtual visit I agree to the following:  I hereby voluntarily request, consent and authorize [facility] and its employed or contracted physicians, physician assistants, nurse practitioners or other licensed health care professionals (the Practitioner), to provide me with telemedicine health care services (the "Services") as deemed necessary by the treating Practitioner. I acknowledge and consent to receive the Services by the Practitioner via telemedicine. I understand that the telemedicine visit will involve communicating with the Practitioner through live audiovisual communication technology and the disclosure of certain medical information by electronic transmission. I acknowledge that I have been given the opportunity to request an in-person assessment or other available alternative prior to the telemedicine visit and am voluntarily participating in the telemedicine visit.  I understand that I have the right to withhold or withdraw my consent to the use of telemedicine in the course of my care at any time, without affecting my right to future care or treatment, and that the Practitioner or I may terminate the telemedicine visit at any time. I understand that I have the right to inspect all information obtained and/or recorded in the course of the telemedicine visit and may receive copies of available information for a reasonable fee.  I understand that some of the potential risks of receiving the Services via telemedicine include:  Marland Kitchen Delay or interruption in medical evaluation due to technological equipment failure or disruption; . Information transmitted may not be sufficient (e.g. poor resolution of images) to allow for appropriate medical decision making by the  Practitioner; and/or  . In rare instances, security protocols could fail, causing a breach of personal health information.  Furthermore, I acknowledge that it is my responsibility to provide information about my medical history, conditions and care that is complete and accurate to the best of my ability. I acknowledge that Practitioner's advice, recommendations, and/or decision may be based on factors not within their control, such as incomplete or inaccurate data provided by me or distortions of diagnostic images or specimens that may result from electronic transmissions. I understand that the practice of medicine is not an exact science and that Practitioner makes no warranties or guarantees regarding treatment outcomes. I acknowledge that a copy of this consent can be made available to me via my patient portal (Azure), or I can request a printed copy by calling the office of [facility].    I understand that my insurance will be billed for this visit.   I have read or had this consent read to me. . I understand the contents of this consent, which adequately explains the benefits and risks of the Services being provided via telemedicine.  . I have been provided ample opportunity to ask questions regarding this consent and the Services and have had my questions answered to my satisfaction. . I give my informed consent for the services to be provided through the use of telemedicine in my medical care
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
## 2019-09-25 NOTE — Patient Instructions (Signed)
Medication Instructions:  Your physician recommends that you continue on your current medications as directed. Please refer to the Current Medication list given to you today.  *If you need a refill on your cardiac medications before your next appointment, please call your pharmacy*   Lab Work: none If you have labs (blood work) drawn today and your tests are completely normal, you will receive your results only by: Marland Kitchen MyChart Message (if you have MyChart) OR . A paper copy in the mail If you have any lab test that is abnormal or we need to change your treatment, we will call you to review the results.   Testing/Procedures: none   Follow-Up: At [facility], you and your health needs are our priority.  As part of our continuing mission to provide you with exceptional heart care, we have created designated Provider Care Teams.  These Care Teams include your primary Cardiologist (physician) and Advanced Practice Providers (APPs -  Physician Assistants and Nurse Practitioners) who all work together to provide you with the care you need, when you need it.  We recommend signing up for the patient portal called "MyChart".  Sign up information is provided on this After Visit Summary.  MyChart is used to connect with patients for Virtual Visits (Telemedicine).  Patients are able to view lab/test results, encounter notes, upcoming appointments, etc.  Non-urgent messages can be sent to your provider as well.   To learn more about what you can do with MyChart, go to [email or web address].    Your next appointment:   Follow up as needed with Dr Radford Pax

## 2019-10-09 ENCOUNTER — Telehealth: Payer: Self-pay | Admitting: *Deleted

## 2019-10-09 NOTE — Telephone Encounter (Signed)
Informed patient of sleep study results and patient understanding was verbalized. Patient understands his sleep study showed Patient has very mild OSA - set up OV to discuss treatment options.    Pt is aware and agreeable to normal results.     OV scheduled for 09/25/19

## 2019-10-09 NOTE — Telephone Encounter (Signed)
-----   Message from Sueanne Margarita, MD sent at 09/09/2019 10:59 PM EDT ----- Patient has very mild OSA - set up OV to discuss treatment options.

## 2019-10-09 NOTE — Telephone Encounter (Deleted)
-----   Message from Sueanne Margarita, MD sent at 09/09/2019 10:59 PM EDT ----- Patient has very mild OSA - set up OV to discuss treatment options.

## 2019-10-10 ENCOUNTER — Other Ambulatory Visit: Payer: Self-pay | Admitting: Interventional Cardiology

## 2019-10-20 ENCOUNTER — Encounter: Payer: Medicare Other | Admitting: Registered Nurse

## 2019-10-20 ENCOUNTER — Other Ambulatory Visit: Payer: Self-pay

## 2019-10-20 ENCOUNTER — Encounter: Payer: Self-pay | Admitting: Registered Nurse

## 2019-10-20 ENCOUNTER — Encounter: Payer: Medicare Other | Attending: Physical Medicine and Rehabilitation | Admitting: Registered Nurse

## 2019-10-20 VITALS — BP 99/76 | HR 91 | Temp 97.5°F | Ht 73.0 in | Wt 272.0 lb

## 2019-10-20 DIAGNOSIS — G894 Chronic pain syndrome: Secondary | ICD-10-CM

## 2019-10-20 DIAGNOSIS — Z79891 Long term (current) use of opiate analgesic: Secondary | ICD-10-CM | POA: Diagnosis present

## 2019-10-20 DIAGNOSIS — Z5181 Encounter for therapeutic drug level monitoring: Secondary | ICD-10-CM

## 2019-10-20 DIAGNOSIS — M1732 Unilateral post-traumatic osteoarthritis, left knee: Secondary | ICD-10-CM

## 2019-10-20 DIAGNOSIS — F418 Other specified anxiety disorders: Secondary | ICD-10-CM

## 2019-10-20 DIAGNOSIS — S8411XA Injury of peroneal nerve at lower leg level, right leg, initial encounter: Secondary | ICD-10-CM | POA: Diagnosis present

## 2019-10-20 DIAGNOSIS — S8412XS Injury of peroneal nerve at lower leg level, left leg, sequela: Secondary | ICD-10-CM

## 2019-10-20 DIAGNOSIS — M7522 Bicipital tendinitis, left shoulder: Secondary | ICD-10-CM | POA: Insufficient documentation

## 2019-10-20 DIAGNOSIS — M722 Plantar fascial fibromatosis: Secondary | ICD-10-CM | POA: Insufficient documentation

## 2019-10-20 MED ORDER — ALPRAZOLAM 0.5 MG PO TABS: 0.5000 mg | ORAL_TABLET | Freq: Every day | ORAL | 2 refills | Status: DC | PRN

## 2019-10-20 MED ORDER — OXYCODONE HCL 15 MG PO TABS: 15.0000 mg | ORAL_TABLET | Freq: Three times a day (TID) | ORAL | 0 refills | Status: DC | PRN

## 2019-10-20 MED ORDER — PREGABALIN 150 MG PO CAPS: ORAL_CAPSULE | ORAL | 0 refills | Status: DC
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
## 2019-10-22 NOTE — Addendum Note (Signed)
Addended by: Bayard Hugger on: 10/22/2019 04:25 PM   Modules accepted: Level of Service

## 2019-10-23 ENCOUNTER — Other Ambulatory Visit: Payer: Self-pay | Admitting: Interventional Cardiology

## 2019-10-24 LAB — DRUG TOX MONITOR 1 W/CONF, ORAL FLD
Alprazolam: 1.95 ng/mL — ABNORMAL HIGH (ref <0.50)
Amphetamines: NEGATIVE ng/mL (ref <10)
Barbiturates: NEGATIVE ng/mL (ref <10)
Benzodiazepines: POSITIVE ng/mL — AB (ref <0.50)
Buprenorphine: NEGATIVE ng/mL (ref <0.10)
Chlordiazepoxide: NEGATIVE ng/mL (ref <0.50)
Clonazepam: NEGATIVE ng/mL (ref <0.50)
Cocaine: NEGATIVE ng/mL (ref <5.0)
Codeine: NEGATIVE ng/mL (ref <2.5)
Diazepam: NEGATIVE ng/mL (ref <0.50)
Dihydrocodeine: NEGATIVE ng/mL (ref <2.5)
Fentanyl: NEGATIVE ng/mL (ref <0.10)
Flunitrazepam: NEGATIVE ng/mL (ref <0.50)
Flurazepam: NEGATIVE ng/mL (ref <0.50)
Heroin Metabolite: NEGATIVE ng/mL (ref <1.0)
Hydrocodone: NEGATIVE ng/mL (ref <2.5)
Hydromorphone: NEGATIVE ng/mL (ref <2.5)
Lorazepam: NEGATIVE ng/mL (ref <0.50)
MARIJUANA: NEGATIVE ng/mL (ref <2.5)
MDMA: NEGATIVE ng/mL (ref <10)
Meprobamate: NEGATIVE ng/mL (ref <2.5)
Methadone: NEGATIVE ng/mL (ref <5.0)
Midazolam: NEGATIVE ng/mL (ref <0.50)
Morphine: NEGATIVE ng/mL (ref <2.5)
Nicotine Metabolite: NEGATIVE ng/mL (ref <5.0)
Nordiazepam: NEGATIVE ng/mL (ref <0.50)
Norhydrocodone: NEGATIVE ng/mL (ref <2.5)
Noroxycodone: 12.7 ng/mL — ABNORMAL HIGH (ref <2.5)
Opiates: POSITIVE ng/mL — AB (ref <2.5)
Oxazepam: NEGATIVE ng/mL (ref <0.50)
Oxycodone: 92.2 ng/mL — ABNORMAL HIGH (ref <2.5)
Oxymorphone: NEGATIVE ng/mL (ref <2.5)
Phencyclidine: NEGATIVE ng/mL (ref <10)
Tapentadol: NEGATIVE ng/mL (ref <5.0)
Temazepam: NEGATIVE ng/mL (ref <0.50)
Tramadol: NEGATIVE ng/mL (ref <5.0)
Triazolam: NEGATIVE ng/mL (ref <0.50)
Zolpidem: NEGATIVE ng/mL (ref <5.0)

## 2019-10-24 LAB — DRUG TOX ALC METAB W/CON, ORAL FLD: Alcohol Metabolite: NEGATIVE ng/mL (ref <25)

## 2019-10-26 ENCOUNTER — Telehealth: Payer: Self-pay | Admitting: *Deleted

## 2019-10-26 NOTE — Telephone Encounter (Signed)
Oral swab drug screen was consistent for prescribed medications.  ?

## 2019-11-25 ENCOUNTER — Ambulatory Visit (HOSPITAL_COMMUNITY)
Admission: RE | Admit: 2019-11-25 | Discharge: 2019-11-25 | Disposition: A | Discharge status: Home / Self Care | Payer: Medicare Other | Source: Ambulatory Visit | Attending: Internal Medicine | Admitting: Internal Medicine

## 2019-11-25 ENCOUNTER — Other Ambulatory Visit: Payer: Self-pay

## 2019-11-25 ENCOUNTER — Encounter (HOSPITAL_COMMUNITY): Payer: Self-pay | Admitting: Internal Medicine

## 2019-11-25 ENCOUNTER — Ambulatory Visit (HOSPITAL_BASED_OUTPATIENT_CLINIC_OR_DEPARTMENT_OTHER)
Admission: RE | Admit: 2019-11-25 | Discharge: 2019-11-25 | Disposition: A | Discharge status: Home / Self Care | Payer: Medicare Other | Source: Ambulatory Visit | Attending: Internal Medicine | Admitting: Internal Medicine

## 2019-11-25 VITALS — BP 98/64 | HR 84 | Wt 270.0 lb

## 2019-11-25 DIAGNOSIS — J45909 Unspecified asthma, uncomplicated: Secondary | ICD-10-CM | POA: Insufficient documentation

## 2019-11-25 DIAGNOSIS — I5022 Chronic systolic (congestive) heart failure: Secondary | ICD-10-CM

## 2019-11-25 DIAGNOSIS — R5383 Other fatigue: Secondary | ICD-10-CM | POA: Insufficient documentation

## 2019-11-25 DIAGNOSIS — R42 Dizziness and giddiness: Secondary | ICD-10-CM | POA: Insufficient documentation

## 2019-11-25 DIAGNOSIS — Z7984 Long term (current) use of oral hypoglycemic drugs: Secondary | ICD-10-CM | POA: Insufficient documentation

## 2019-11-25 DIAGNOSIS — Z7982 Long term (current) use of aspirin: Secondary | ICD-10-CM | POA: Insufficient documentation

## 2019-11-25 DIAGNOSIS — I110 Hypertensive heart disease with heart failure: Secondary | ICD-10-CM | POA: Diagnosis not present

## 2019-11-25 DIAGNOSIS — Z4502 Encounter for adjustment and management of automatic implantable cardiac defibrillator: Secondary | ICD-10-CM

## 2019-11-25 DIAGNOSIS — G4733 Obstructive sleep apnea (adult) (pediatric): Secondary | ICD-10-CM | POA: Diagnosis not present

## 2019-11-25 DIAGNOSIS — Z79899 Other long term (current) drug therapy: Secondary | ICD-10-CM | POA: Insufficient documentation

## 2019-11-25 DIAGNOSIS — Z7901 Long term (current) use of anticoagulants: Secondary | ICD-10-CM | POA: Insufficient documentation

## 2019-11-25 DIAGNOSIS — I10 Essential (primary) hypertension: Secondary | ICD-10-CM | POA: Diagnosis not present

## 2019-11-25 DIAGNOSIS — E669 Obesity, unspecified: Secondary | ICD-10-CM | POA: Diagnosis not present

## 2019-11-25 DIAGNOSIS — I428 Other cardiomyopathies: Secondary | ICD-10-CM | POA: Diagnosis not present

## 2019-11-25 LAB — ECHOCARDIOGRAM COMPLETE
Area-P 1/2: 4.89 cm2
Calc EF: 24 %
MV M vel: 4.75 m/s
MV Peak grad: 90.3 mmHg
Radius: 0.4 cm
S' Lateral: 6.1 cm
Single Plane A2C EF: 30.5 %
Single Plane A4C EF: 17.4 %

## 2019-11-25 LAB — BASIC METABOLIC PANEL
Anion gap: 11 (ref 5–15)
BUN: 16 mg/dL (ref 6–20)
CO2: 19 mmol/L — ABNORMAL LOW (ref 22–32)
Calcium: 9.4 mg/dL (ref 8.9–10.3)
Chloride: 107 mmol/L (ref 98–111)
Creatinine, Ser: 1.1 mg/dL (ref 0.61–1.24)
GFR calc Af Amer: >60 mL/min (ref >60)
GFR calc non Af Amer: >60 mL/min (ref >60)
Glucose, Bld: 106 mg/dL — ABNORMAL HIGH (ref 70–99)
Potassium: 4 mmol/L (ref 3.5–5.1)
Sodium: 137 mmol/L (ref 135–145)

## 2019-11-25 LAB — BRAIN NATRIURETIC PEPTIDE: B Natriuretic Peptide: 437.7 pg/mL — ABNORMAL HIGH (ref 0.0–100.0)
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
## 2019-11-25 NOTE — Patient Instructions (Signed)
Labs done today. We will contact you only if your labs are abnormal.  No medication changes were made. Please continue all other medications as prescribed.  Your physician recommends that you schedule a follow-up appointment in: 4 months  If you have any questions or concerns before your next appointment please send [place] a message through [place] or call our office at [phone].    TO LEAVE A MESSAGE FOR THE NURSE SELECT OPTION 2, PLEASE LEAVE A MESSAGE INCLUDING: . YOUR NAME . DATE OF BIRTH . CALL BACK NUMBER . REASON FOR CALL**this is important as we prioritize the call backs  [place] AS LONG AS YOU CALL BEFORE 4:00 PM   Your physician has recommended that you have a cardiopulmonary stress test (CPX). CPX testing is a non-invasive measurement of heart and lung function. It replaces a traditional treadmill stress test. This type of test provides a tremendous amount of information that relates not only to your present condition but also for future outcomes. This test combines measurements of you ventilation, respiratory gas exchange in the lungs, electrocardiogram (EKG), blood pressure and physical response before, during, and following an exercise protocol.    Do the following things EVERYDAY: 1) Weigh yourself in the morning before breakfast. Write it down and keep it in a log. 2) Take your medicines as prescribed 3) Eat low salt foods--Limit salt (sodium) to 2000 mg per day.  4) Stay as active as you can everyday 5) Limit all fluids for the day to less than 2 liters   At the [facility], you and your health needs are our priority. As part of our continuing mission to provide you with exceptional heart care, we have created designated Provider Care Teams. These Care Teams include your primary Cardiologist (physician) and Advanced Practice Providers (APPs- Physician Assistants and Nurse Practitioners) who all work together to provide  you with the care you need, when you need it.   You may see any of the following providers on your designated Care Team at your next follow up: Marland Kitchen Dr Glori Bickers . Dr Loralie Champagne . Darrick Grinder, NP . Lyda Jester, PA . Audry Riles, PharmD   Please be sure to bring in all your medications bottles to every appointment.

## 2019-11-25 NOTE — Progress Notes (Signed)
  Echocardiogram 2D Echocardiogram has been performed.  Victor [name] 11/25/2019, 2:03 PM

## 2019-11-25 NOTE — Addendum Note (Signed)
Encounter addended by: Shonna Chock, CMA on: 1/94/7125 2:55 PM  Actions taken: Visit diagnoses modified, Follow-up modified, Charge Capture section accepted, Clinical Note Signed, Order list changed, Diagnosis association updated

## 2019-12-07 ENCOUNTER — Other Ambulatory Visit: Payer: Self-pay | Admitting: Registered Nurse

## 2019-12-18 ENCOUNTER — Encounter: Payer: Medicare Other | Attending: Physical Medicine and Rehabilitation | Admitting: Registered Nurse

## 2019-12-18 ENCOUNTER — Other Ambulatory Visit: Payer: Self-pay

## 2019-12-18 VITALS — BP 105/69 | HR 77 | Temp 98.6°F | Ht 73.0 in | Wt 278.0 lb

## 2019-12-18 DIAGNOSIS — M1732 Unilateral post-traumatic osteoarthritis, left knee: Secondary | ICD-10-CM | POA: Diagnosis present

## 2019-12-18 DIAGNOSIS — F418 Other specified anxiety disorders: Secondary | ICD-10-CM | POA: Diagnosis present

## 2019-12-18 DIAGNOSIS — S8411XA Injury of peroneal nerve at lower leg level, right leg, initial encounter: Secondary | ICD-10-CM | POA: Insufficient documentation

## 2019-12-18 DIAGNOSIS — S8412XS Injury of peroneal nerve at lower leg level, left leg, sequela: Secondary | ICD-10-CM | POA: Diagnosis present

## 2019-12-18 DIAGNOSIS — Z5181 Encounter for therapeutic drug level monitoring: Secondary | ICD-10-CM

## 2019-12-18 DIAGNOSIS — M722 Plantar fascial fibromatosis: Secondary | ICD-10-CM | POA: Diagnosis present

## 2019-12-18 DIAGNOSIS — G894 Chronic pain syndrome: Secondary | ICD-10-CM

## 2019-12-18 DIAGNOSIS — M25562 Pain in left knee: Secondary | ICD-10-CM

## 2019-12-18 DIAGNOSIS — M7522 Bicipital tendinitis, left shoulder: Secondary | ICD-10-CM | POA: Diagnosis present

## 2019-12-18 DIAGNOSIS — G8929 Other chronic pain: Secondary | ICD-10-CM

## 2019-12-18 DIAGNOSIS — Z79891 Long term (current) use of opiate analgesic: Secondary | ICD-10-CM | POA: Diagnosis present

## 2019-12-18 DIAGNOSIS — M25572 Pain in left ankle and joints of left foot: Secondary | ICD-10-CM

## 2019-12-18 MED ORDER — PREGABALIN 150 MG PO CAPS: ORAL_CAPSULE | ORAL | 3 refills | Status: DC

## 2019-12-18 MED ORDER — ALPRAZOLAM 0.5 MG PO TABS: 0.5000 mg | ORAL_TABLET | Freq: Every day | ORAL | 2 refills | Status: DC | PRN

## 2019-12-18 MED ORDER — OXYCODONE HCL 15 MG PO TABS: 15.0000 mg | ORAL_TABLET | Freq: Three times a day (TID) | ORAL | 0 refills | Status: DC | PRN
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
## 2019-12-21 ENCOUNTER — Encounter: Payer: Self-pay | Admitting: Registered Nurse

## 2019-12-24 ENCOUNTER — Encounter (HOSPITAL_COMMUNITY): Payer: Medicare Other

## 2019-12-26 ENCOUNTER — Other Ambulatory Visit: Payer: Self-pay | Admitting: Physician Assistant

## 2019-12-28 ENCOUNTER — Telehealth: Payer: Self-pay | Admitting: Internal Medicine

## 2019-12-28 NOTE — Telephone Encounter (Signed)
° ° °  Pt said he will in [place] on 08/26 and would like to know if he can change his appt with Dr. Caryl Comes to virtual appt.

## 2019-12-28 NOTE — Telephone Encounter (Signed)
Called and spoke to patient. He states that he will be out of town in [place] on 8/26 and wants to know if he can change his appointment to virtual. Made patient aware that Dr. Caryl Comes is actually working remote that day so it works out perfect. Patient's appointment type changed to a phone visit.

## 2019-12-30 ENCOUNTER — Other Ambulatory Visit (HOSPITAL_COMMUNITY): Payer: Self-pay | Admitting: Internal Medicine

## 2019-12-31 ENCOUNTER — Other Ambulatory Visit: Payer: Self-pay

## 2019-12-31 ENCOUNTER — Telehealth (INDEPENDENT_AMBULATORY_CARE_PROVIDER_SITE_OTHER): Payer: Medicare Other | Admitting: Internal Medicine

## 2019-12-31 VITALS — BP 105/78 | Ht 73.0 in | Wt 270.0 lb

## 2019-12-31 DIAGNOSIS — I5022 Chronic systolic (congestive) heart failure: Secondary | ICD-10-CM

## 2019-12-31 DIAGNOSIS — I428 Other cardiomyopathies: Secondary | ICD-10-CM | POA: Diagnosis not present

## 2019-12-31 NOTE — Patient Instructions (Signed)
Medication Instructions:  ?Your physician recommends that you continue on your current medications as directed. Please refer to the Current Medication list given to you today. ? ?*If you need a refill on your cardiac medications before your next appointment, please call your pharmacy* ? ? ?Lab Work: ?None ordered. ? ?If you have labs (blood work) drawn today and your tests are completely normal, you will receive your results only by: ?MyChart Message (if you have MyChart) OR ?A paper copy in the mail ?If you have any lab test that is abnormal or we need to change your treatment, we will call you to review the results. ? ? ?Testing/Procedures: ?None ordered. ? ? ? ?Follow-Up: ?At [facility], you and your health needs are our priority.  As part of our continuing mission to provide you with exceptional heart care, we have created designated Provider Care Teams.  These Care Teams include your primary Cardiologist (physician) and Advanced Practice Providers (APPs -  Physician Assistants and Nurse Practitioners) who all work together to provide you with the care you need, when you need it. ? ?We recommend signing up for the patient portal called "MyChart".  Sign up information is provided on this After Visit Summary.  MyChart is used to connect with patients for Virtual Visits (Telemedicine).  Patients are able to view lab/test results, encounter notes, upcoming appointments, etc.  Non-urgent messages can be sent to your provider as well.   ?To learn more about what you can do with MyChart, go to [email or web address].   ? ?Your next appointment:   ?Follow up as needed with Dr Klein ?
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
## 2020-01-15 ENCOUNTER — Telehealth: Payer: Self-pay

## 2020-01-15 DIAGNOSIS — S8412XS Injury of peroneal nerve at lower leg level, left leg, sequela: Secondary | ICD-10-CM

## 2020-01-15 DIAGNOSIS — G894 Chronic pain syndrome: Secondary | ICD-10-CM

## 2020-01-15 MED ORDER — OXYCODONE HCL 15 MG PO TABS: 15.0000 mg | ORAL_TABLET | Freq: Three times a day (TID) | ORAL | 0 refills | Status: DC | PRN

## 2020-01-15 NOTE — Telephone Encounter (Signed)
PMP was Reviewed: Oxycodone e-scribed. Today. Placed a call to Victor [name] regarding the above, no answer. Left message regarding the above.

## 2020-01-15 NOTE — Telephone Encounter (Signed)
Oxycodone Rx is not on file at [facility] for patient.

## 2020-01-21 ENCOUNTER — Other Ambulatory Visit (HOSPITAL_COMMUNITY): Payer: Self-pay | Admitting: Internal Medicine

## 2020-02-12 ENCOUNTER — Encounter: Payer: Self-pay | Admitting: Registered Nurse

## 2020-02-12 ENCOUNTER — Other Ambulatory Visit: Payer: Self-pay

## 2020-02-12 ENCOUNTER — Encounter: Payer: Medicare Other | Attending: Physical Medicine and Rehabilitation | Admitting: Registered Nurse

## 2020-02-12 VITALS — BP 112/78 | HR 86 | Temp 97.8°F | Ht 73.0 in | Wt 276.8 lb

## 2020-02-12 DIAGNOSIS — G894 Chronic pain syndrome: Secondary | ICD-10-CM | POA: Diagnosis present

## 2020-02-12 DIAGNOSIS — M545 Low back pain, unspecified: Secondary | ICD-10-CM | POA: Diagnosis present

## 2020-02-12 DIAGNOSIS — M1732 Unilateral post-traumatic osteoarthritis, left knee: Secondary | ICD-10-CM | POA: Diagnosis present

## 2020-02-12 DIAGNOSIS — F418 Other specified anxiety disorders: Secondary | ICD-10-CM | POA: Insufficient documentation

## 2020-02-12 DIAGNOSIS — G8929 Other chronic pain: Secondary | ICD-10-CM | POA: Insufficient documentation

## 2020-02-12 DIAGNOSIS — M25562 Pain in left knee: Secondary | ICD-10-CM | POA: Insufficient documentation

## 2020-02-12 DIAGNOSIS — Z79891 Long term (current) use of opiate analgesic: Secondary | ICD-10-CM | POA: Insufficient documentation

## 2020-02-12 DIAGNOSIS — M25572 Pain in left ankle and joints of left foot: Secondary | ICD-10-CM | POA: Diagnosis present

## 2020-02-12 DIAGNOSIS — S8412XS Injury of peroneal nerve at lower leg level, left leg, sequela: Secondary | ICD-10-CM | POA: Diagnosis present

## 2020-02-12 DIAGNOSIS — Z5181 Encounter for therapeutic drug level monitoring: Secondary | ICD-10-CM | POA: Insufficient documentation

## 2020-02-12 MED ORDER — OXYCODONE HCL 15 MG PO TABS: 15.0000 mg | ORAL_TABLET | Freq: Three times a day (TID) | ORAL | 0 refills | Status: DC | PRN

## 2020-02-12 MED ORDER — ALPRAZOLAM 0.5 MG PO TABS: 0.5000 mg | ORAL_TABLET | Freq: Every day | ORAL | 2 refills | Status: DC | PRN

## 2020-02-12 NOTE — Addendum Note (Signed)
Addended by: Franchot Gallo on: 02/12/2020 09:49 AM   Modules accepted: Orders
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
## 2020-02-16 ENCOUNTER — Ambulatory Visit
Admission: RE | Admit: 2020-02-16 | Discharge: 2020-02-16 | Disposition: A | Discharge status: Home / Self Care | Payer: Medicare Other | Source: Ambulatory Visit | Attending: Registered Nurse | Admitting: Registered Nurse

## 2020-02-16 ENCOUNTER — Other Ambulatory Visit: Payer: Self-pay

## 2020-02-18 LAB — DRUG TOX MONITOR 1 W/CONF, ORAL FLD
Alprazolam: 1.12 ng/mL — ABNORMAL HIGH
Amphetamines: NEGATIVE ng/mL
Barbiturates: NEGATIVE ng/mL
Benzodiazepines: POSITIVE ng/mL — AB
Buprenorphine: NEGATIVE ng/mL
Chlordiazepoxide: NEGATIVE ng/mL
Clonazepam: NEGATIVE ng/mL
Cocaine: NEGATIVE ng/mL
Codeine: NEGATIVE ng/mL
Diazepam: NEGATIVE ng/mL
Dihydrocodeine: NEGATIVE ng/mL
Fentanyl: NEGATIVE ng/mL
Flunitrazepam: NEGATIVE ng/mL
Flurazepam: NEGATIVE ng/mL
Heroin Metabolite: NEGATIVE ng/mL
Hydrocodone: NEGATIVE ng/mL
Hydromorphone: NEGATIVE ng/mL
Lorazepam: NEGATIVE ng/mL
MARIJUANA: NEGATIVE ng/mL
MDMA: NEGATIVE ng/mL
Meprobamate: NEGATIVE ng/mL
Methadone: NEGATIVE ng/mL
Midazolam: NEGATIVE ng/mL
Morphine: NEGATIVE ng/mL
Nicotine Metabolite: NEGATIVE ng/mL
Nordiazepam: NEGATIVE ng/mL
Norhydrocodone: NEGATIVE ng/mL
Noroxycodone: 15.9 ng/mL — ABNORMAL HIGH
Opiates: POSITIVE ng/mL — AB
Oxazepam: NEGATIVE ng/mL
Oxycodone: 201.1 ng/mL — ABNORMAL HIGH
Oxymorphone: NEGATIVE ng/mL
Phencyclidine: NEGATIVE ng/mL
Tapentadol: NEGATIVE ng/mL
Temazepam: NEGATIVE ng/mL
Tramadol: NEGATIVE ng/mL
Triazolam: NEGATIVE ng/mL
Zolpidem: NEGATIVE ng/mL

## 2020-02-18 LAB — DRUG TOX ALC METAB W/CON, ORAL FLD: Alcohol Metabolite: NEGATIVE ng/mL

## 2020-02-19 ENCOUNTER — Telehealth: Payer: Self-pay | Admitting: Registered Nurse

## 2020-02-19 ENCOUNTER — Telehealth: Payer: Self-pay | Admitting: *Deleted

## 2020-02-19 ENCOUNTER — Other Ambulatory Visit: Payer: Self-pay | Admitting: Physician Assistant

## 2020-02-19 NOTE — Telephone Encounter (Signed)
Oral swab drug screen was consistent for prescribed medications.  ?

## 2020-02-19 NOTE — Telephone Encounter (Signed)
Placed a call to Mr. Grandmaison regarding his Lumbar X-Ray results"  IMPRESSION: No acute osseous findings or malalignment. Probable mild facet hypertrophy inferiorly.  He will continue to alternate with Ice and Heat, using conservative treatment and will call provider on Monday 02/22/2020, if no relief noted. He verbalizes understanding.

## 2020-02-22 ENCOUNTER — Telehealth: Payer: Self-pay | Admitting: Registered Nurse

## 2020-02-22 MED ORDER — METHYLPREDNISOLONE 4 MG PO TBPK: ORAL_TABLET | ORAL | 0 refills | Status: DC

## 2020-02-22 NOTE — Telephone Encounter (Signed)
Received My- Chart message from Victor [name], he's still having back pain. He was instructed to alternat Ice and Heat therapy and willing to try the medrol dose pak. Medrol dose pak ordered. Message sent via My-Chart.

## 2020-02-27 ENCOUNTER — Other Ambulatory Visit: Payer: Self-pay | Admitting: Interventional Cardiology

## 2020-03-23 ENCOUNTER — Other Ambulatory Visit: Payer: Self-pay | Admitting: Registered Nurse

## 2020-03-24 NOTE — Telephone Encounter (Signed)
PMP was Reviewed. : Lyrica was filled on 03/19/2020. Placed a call to Victor [name], he reports his wife overnight his Lyrica to him on 03/21/2020, he's in [place] caring for his mother. Explain to Victor [name] in detail his medication should never be sent via the mail, he verbalizes understanding. He states he never received the Lyrica. He has been in contact with the post office and [place]. He states he spoke with [employer] and he has received a override from [place] for his Lyrica.

## 2020-04-08 ENCOUNTER — Encounter: Payer: Self-pay | Admitting: Registered Nurse

## 2020-04-08 ENCOUNTER — Other Ambulatory Visit: Payer: Self-pay

## 2020-04-08 ENCOUNTER — Encounter: Payer: Medicare Other | Attending: Physical Medicine and Rehabilitation | Admitting: Registered Nurse

## 2020-04-08 VITALS — BP 106/64 | HR 78 | Temp 97.8°F | Ht 73.0 in | Wt 275.0 lb

## 2020-04-08 DIAGNOSIS — S8412XS Injury of peroneal nerve at lower leg level, left leg, sequela: Secondary | ICD-10-CM | POA: Diagnosis not present

## 2020-04-08 DIAGNOSIS — G894 Chronic pain syndrome: Secondary | ICD-10-CM | POA: Insufficient documentation

## 2020-04-08 DIAGNOSIS — M1732 Unilateral post-traumatic osteoarthritis, left knee: Secondary | ICD-10-CM | POA: Diagnosis not present

## 2020-04-08 DIAGNOSIS — Z5181 Encounter for therapeutic drug level monitoring: Secondary | ICD-10-CM | POA: Insufficient documentation

## 2020-04-08 DIAGNOSIS — Z79891 Long term (current) use of opiate analgesic: Secondary | ICD-10-CM | POA: Diagnosis present

## 2020-04-08 MED ORDER — OXYCODONE HCL 15 MG PO TABS: 15.0000 mg | ORAL_TABLET | Freq: Three times a day (TID) | ORAL | 0 refills | Status: DC | PRN
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
## 2020-04-25 ENCOUNTER — Telehealth (HOSPITAL_COMMUNITY): Payer: Self-pay | Admitting: Cardiology

## 2020-04-25 NOTE — Telephone Encounter (Signed)
Agree, symptoms do not sound cardiac. Normal cors by cath last year, EF low but on good meds and without increasing SOB, weight gain, swelling, palpitations or CP. EF likely not getting worse. He has documented mild sleep apnea, though not felt to need CPAP; this could be getting worse leading to increasing fatigue. Increased fatigue could be viral in nature, ?COVID exposure. Take a break from the gym until fatigue subsides. Important to keep follow up with DB in couple weeks to reassess.

## 2020-04-25 NOTE — Telephone Encounter (Signed)
Pt aware and voiced understanding 

## 2020-04-25 NOTE — Telephone Encounter (Signed)
Patient called to report he is very lethargic today. Reports L arm pain (seeing pain management for L arm injury)   denies SOB, increased weight or active chest pain, recent medication changes, dizziness or HA  Reports he was going to the gym, and reports he just too tired today and that's unusual.  Advised to maybe take a rest from the gym today. Symptoms do not sound completely cardiac in nature Any further advise will come from providers

## 2020-05-06 ENCOUNTER — Other Ambulatory Visit (HOSPITAL_COMMUNITY): Payer: Self-pay | Admitting: Internal Medicine

## 2020-05-09 MED ORDER — PREGABALIN 150 MG PO CAPS
ORAL_CAPSULE | ORAL | 3 refills | Status: DC
Start: 2020-05-09 — End: 2020-08-02
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
## 2020-05-13 ENCOUNTER — Ambulatory Visit (HOSPITAL_COMMUNITY)
Admission: RE | Admit: 2020-05-13 | Discharge: 2020-05-13 | Disposition: A | Discharge status: Home / Self Care | Payer: Medicare Other | Source: Ambulatory Visit | Attending: Internal Medicine | Admitting: Internal Medicine

## 2020-05-13 ENCOUNTER — Other Ambulatory Visit: Payer: Self-pay

## 2020-05-13 ENCOUNTER — Encounter (HOSPITAL_COMMUNITY): Payer: Self-pay | Admitting: Internal Medicine

## 2020-05-13 VITALS — BP 120/80 | HR 85 | Wt 279.2 lb

## 2020-05-13 DIAGNOSIS — E669 Obesity, unspecified: Secondary | ICD-10-CM | POA: Insufficient documentation

## 2020-05-13 DIAGNOSIS — G4733 Obstructive sleep apnea (adult) (pediatric): Secondary | ICD-10-CM | POA: Diagnosis not present

## 2020-05-13 DIAGNOSIS — I5022 Chronic systolic (congestive) heart failure: Secondary | ICD-10-CM

## 2020-05-13 DIAGNOSIS — I428 Other cardiomyopathies: Secondary | ICD-10-CM | POA: Diagnosis not present

## 2020-05-13 DIAGNOSIS — I340 Nonrheumatic mitral (valve) insufficiency: Secondary | ICD-10-CM | POA: Diagnosis not present

## 2020-05-13 DIAGNOSIS — Z7951 Long term (current) use of inhaled steroids: Secondary | ICD-10-CM | POA: Diagnosis not present

## 2020-05-13 DIAGNOSIS — Z7984 Long term (current) use of oral hypoglycemic drugs: Secondary | ICD-10-CM | POA: Diagnosis not present

## 2020-05-13 DIAGNOSIS — Z841 Family history of disorders of kidney and ureter: Secondary | ICD-10-CM | POA: Diagnosis not present

## 2020-05-13 DIAGNOSIS — Z7982 Long term (current) use of aspirin: Secondary | ICD-10-CM | POA: Insufficient documentation

## 2020-05-13 DIAGNOSIS — Z79899 Other long term (current) drug therapy: Secondary | ICD-10-CM | POA: Diagnosis not present

## 2020-05-13 DIAGNOSIS — I447 Left bundle-branch block, unspecified: Secondary | ICD-10-CM | POA: Diagnosis not present

## 2020-05-13 DIAGNOSIS — R0683 Snoring: Secondary | ICD-10-CM | POA: Diagnosis not present

## 2020-05-13 DIAGNOSIS — I110 Hypertensive heart disease with heart failure: Secondary | ICD-10-CM | POA: Diagnosis present

## 2020-05-13 DIAGNOSIS — Z7952 Long term (current) use of systemic steroids: Secondary | ICD-10-CM | POA: Insufficient documentation

## 2020-05-13 LAB — BASIC METABOLIC PANEL
Anion gap: 11 (ref 5–15)
BUN: 14 mg/dL (ref 6–20)
CO2: 22 mmol/L (ref 22–32)
Calcium: 9.4 mg/dL (ref 8.9–10.3)
Chloride: 106 mmol/L (ref 98–111)
Creatinine, Ser: 1.21 mg/dL (ref 0.61–1.24)
GFR, Estimated: >60 mL/min (ref >60)
Glucose, Bld: 120 mg/dL — ABNORMAL HIGH (ref 70–99)
Potassium: 3.9 mmol/L (ref 3.5–5.1)
Sodium: 139 mmol/L (ref 135–145)

## 2020-05-13 LAB — CBC
HCT: 58.8 % — ABNORMAL HIGH (ref 39.0–52.0)
Hemoglobin: 19.7 g/dL — ABNORMAL HIGH (ref 13.0–17.0)
MCH: 31.6 pg (ref 26.0–34.0)
MCHC: 33.5 g/dL (ref 30.0–36.0)
MCV: 94.4 fL (ref 80.0–100.0)
Platelets: 184 10*3/uL (ref 150–400)
RBC: 6.23 MIL/uL — ABNORMAL HIGH (ref 4.22–5.81)
RDW: 12.8 % (ref 11.5–15.5)
WBC: 6.3 10*3/uL (ref 4.0–10.5)
nRBC: 0 % (ref 0.0–0.2)

## 2020-05-13 LAB — BRAIN NATRIURETIC PEPTIDE: B Natriuretic Peptide: 106.3 pg/mL — ABNORMAL HIGH (ref 0.0–100.0)

## 2020-05-13 MED ORDER — ENTRESTO 97-103 MG PO TABS: 1.0000 | ORAL_TABLET | Freq: Two times a day (BID) | ORAL | 5 refills | Status: DC

## 2020-05-13 NOTE — Patient Instructions (Signed)
  Increase Entresto 97/103(1 tablet) Twice daily   Labs done today, your results will be available in MyChart, we will contact you for abnormal readings.   Your physician has recommended that you have a cardiopulmonary stress test (CPX). CPX testing is a non-invasive measurement of heart and lung function. It replaces a traditional treadmill stress test. This type of test provides a tremendous amount of information that relates not only to your present condition but also for future outcomes. This test combines measurements of you ventilation, respiratory gas exchange in the lungs, electrocardiogram (EKG), blood pressure and physical response before, during, and following an exercise protocol.   Your physician recommends that you schedule a follow-up appointment in: 4 months  If you have any questions or concerns before your next appointment please send [place] a message through [place] or call our office at [phone].    TO LEAVE A MESSAGE FOR THE NURSE SELECT OPTION 2, PLEASE LEAVE A MESSAGE INCLUDING: . YOUR NAME . DATE OF BIRTH . CALL BACK NUMBER . REASON FOR CALL**this is important as we prioritize the call backs  [place] AS LONG AS YOU CALL BEFORE 4:00 PM   At the [facility], you and your health needs are our priority. As part of our continuing mission to provide you with exceptional heart care, we have created designated Provider Care Teams. These Care Teams include your primary Cardiologist (physician) and Advanced Practice Providers (APPs- Physician Assistants and Nurse Practitioners) who all work together to provide you with the care you need, when you need it.   You may see any of the following providers on your designated Care Team at your next follow up: Marland Kitchen Dr Glori Bickers . Dr Loralie Champagne . Darrick Grinder, NP . Lyda Jester, PA . Audry Riles, PharmD   Please be sure to bring in all your medications bottles to every  appointment.

## 2020-05-13 NOTE — Addendum Note (Signed)
Encounter addended by: Stanford Scotland, RN on: 05/13/2020 11:14 AM  Actions taken: Order list changed, Diagnosis association updated, Clinical Note Signed, Charge Capture section accepted

## 2020-05-13 NOTE — Addendum Note (Signed)
Encounter addended by: Stanford Scotland, RN on: 05/13/2020 11:08 AM  Actions taken: Diagnosis association updated, Pharmacy for encounter modified, Order list changed

## 2020-06-07 ENCOUNTER — Encounter: Payer: Medicare Other | Attending: Physical Medicine and Rehabilitation | Admitting: Registered Nurse

## 2020-06-07 ENCOUNTER — Encounter: Payer: Self-pay | Admitting: Registered Nurse

## 2020-06-07 ENCOUNTER — Other Ambulatory Visit: Payer: Self-pay

## 2020-06-07 VITALS — BP 111/74 | HR 95 | Temp 98.3°F | Ht 73.0 in | Wt 276.2 lb

## 2020-06-07 DIAGNOSIS — Z5181 Encounter for therapeutic drug level monitoring: Secondary | ICD-10-CM | POA: Insufficient documentation

## 2020-06-07 DIAGNOSIS — Z79891 Long term (current) use of opiate analgesic: Secondary | ICD-10-CM | POA: Insufficient documentation

## 2020-06-07 DIAGNOSIS — S8412XS Injury of peroneal nerve at lower leg level, left leg, sequela: Secondary | ICD-10-CM | POA: Diagnosis present

## 2020-06-07 DIAGNOSIS — F418 Other specified anxiety disorders: Secondary | ICD-10-CM | POA: Diagnosis present

## 2020-06-07 DIAGNOSIS — M1732 Unilateral post-traumatic osteoarthritis, left knee: Secondary | ICD-10-CM | POA: Insufficient documentation

## 2020-06-07 DIAGNOSIS — G894 Chronic pain syndrome: Secondary | ICD-10-CM | POA: Insufficient documentation

## 2020-06-07 MED ORDER — OXYCODONE HCL 15 MG PO TABS: 15.0000 mg | ORAL_TABLET | Freq: Three times a day (TID) | ORAL | 0 refills | Status: DC | PRN

## 2020-06-07 MED ORDER — OXYCODONE HCL 15 MG PO TABS
15.0000 mg | ORAL_TABLET | Freq: Three times a day (TID) | ORAL | 0 refills | Status: DC | PRN
Start: 2020-06-07 — End: 2020-08-02
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
## 2020-06-10 ENCOUNTER — Ambulatory Visit: Payer: Medicare Other | Admitting: Registered Nurse

## 2020-06-16 ENCOUNTER — Other Ambulatory Visit (HOSPITAL_COMMUNITY): Payer: Self-pay | Admitting: *Deleted

## 2020-06-16 ENCOUNTER — Ambulatory Visit (HOSPITAL_COMMUNITY): Payer: Medicare Other | Attending: Internal Medicine

## 2020-06-16 ENCOUNTER — Other Ambulatory Visit: Payer: Self-pay

## 2020-06-16 DIAGNOSIS — I5022 Chronic systolic (congestive) heart failure: Secondary | ICD-10-CM

## 2020-06-24 ENCOUNTER — Encounter (HOSPITAL_COMMUNITY): Payer: Self-pay

## 2020-07-05 ENCOUNTER — Other Ambulatory Visit: Payer: Self-pay | Admitting: Interventional Cardiology

## 2020-08-02 ENCOUNTER — Encounter: Payer: Self-pay | Admitting: Registered Nurse

## 2020-08-02 ENCOUNTER — Encounter: Payer: Medicare Other | Attending: Physical Medicine and Rehabilitation | Admitting: Registered Nurse

## 2020-08-02 ENCOUNTER — Other Ambulatory Visit: Payer: Self-pay

## 2020-08-02 VITALS — BP 105/73 | HR 71 | Ht 73.0 in | Wt 282.8 lb

## 2020-08-02 DIAGNOSIS — Z79891 Long term (current) use of opiate analgesic: Secondary | ICD-10-CM | POA: Insufficient documentation

## 2020-08-02 DIAGNOSIS — F418 Other specified anxiety disorders: Secondary | ICD-10-CM | POA: Insufficient documentation

## 2020-08-02 DIAGNOSIS — S8412XS Injury of peroneal nerve at lower leg level, left leg, sequela: Secondary | ICD-10-CM | POA: Diagnosis present

## 2020-08-02 DIAGNOSIS — G894 Chronic pain syndrome: Secondary | ICD-10-CM | POA: Diagnosis present

## 2020-08-02 DIAGNOSIS — Z5181 Encounter for therapeutic drug level monitoring: Secondary | ICD-10-CM | POA: Diagnosis present

## 2020-08-02 MED ORDER — PREGABALIN 150 MG PO CAPS: ORAL_CAPSULE | ORAL | 3 refills | Status: DC

## 2020-08-02 MED ORDER — OXYCODONE HCL 15 MG PO TABS
15.0000 mg | ORAL_TABLET | Freq: Three times a day (TID) | ORAL | 0 refills | Status: DC | PRN
Start: 2020-08-02 — End: 2020-08-02

## 2020-08-02 MED ORDER — ALPRAZOLAM 0.5 MG PO TABS
0.5000 mg | ORAL_TABLET | Freq: Every day | ORAL | 2 refills | Status: DC | PRN
Start: 2020-08-02 — End: 2020-09-14

## 2020-08-02 MED ORDER — OXYCODONE HCL 15 MG PO TABS
15.0000 mg | ORAL_TABLET | Freq: Three times a day (TID) | ORAL | 0 refills | Status: DC | PRN
Start: 2020-08-02 — End: 2020-09-14
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
## 2020-08-09 LAB — TOXASSURE SELECT,+ANTIDEPR,UR

## 2020-08-16 ENCOUNTER — Other Ambulatory Visit: Payer: Self-pay | Admitting: Interventional Cardiology

## 2020-08-17 ENCOUNTER — Telehealth: Payer: Self-pay | Admitting: *Deleted

## 2020-08-17 NOTE — Telephone Encounter (Signed)
Urine drug screen is consistent for prescribed medication, but is also positive for alcohol.  I have sent a formal warning through [place]. This is his second offense, as he was issued a warning letter in 2016.

## 2020-09-11 ENCOUNTER — Other Ambulatory Visit: Payer: Self-pay | Admitting: Interventional Cardiology

## 2020-09-14 ENCOUNTER — Encounter: Payer: Self-pay | Admitting: Registered Nurse

## 2020-09-14 ENCOUNTER — Other Ambulatory Visit: Payer: Self-pay

## 2020-09-14 ENCOUNTER — Encounter: Payer: Medicare Other | Attending: Physical Medicine and Rehabilitation | Admitting: Registered Nurse

## 2020-09-14 VITALS — BP 107/74 | HR 100 | Temp 97.7°F | Ht 73.0 in | Wt 275.0 lb

## 2020-09-14 DIAGNOSIS — F418 Other specified anxiety disorders: Secondary | ICD-10-CM | POA: Insufficient documentation

## 2020-09-14 DIAGNOSIS — Z5181 Encounter for therapeutic drug level monitoring: Secondary | ICD-10-CM | POA: Diagnosis present

## 2020-09-14 DIAGNOSIS — M545 Low back pain, unspecified: Secondary | ICD-10-CM | POA: Diagnosis present

## 2020-09-14 DIAGNOSIS — G894 Chronic pain syndrome: Secondary | ICD-10-CM | POA: Diagnosis present

## 2020-09-14 DIAGNOSIS — Z79891 Long term (current) use of opiate analgesic: Secondary | ICD-10-CM | POA: Insufficient documentation

## 2020-09-14 DIAGNOSIS — S8412XS Injury of peroneal nerve at lower leg level, left leg, sequela: Secondary | ICD-10-CM | POA: Insufficient documentation

## 2020-09-14 DIAGNOSIS — M1732 Unilateral post-traumatic osteoarthritis, left knee: Secondary | ICD-10-CM | POA: Diagnosis present

## 2020-09-14 DIAGNOSIS — G8929 Other chronic pain: Secondary | ICD-10-CM | POA: Insufficient documentation

## 2020-09-14 MED ORDER — OXYCODONE HCL 15 MG PO TABS: 15.0000 mg | ORAL_TABLET | Freq: Three times a day (TID) | ORAL | 0 refills | Status: DC | PRN

## 2020-09-14 MED ORDER — PREGABALIN 150 MG PO CAPS: ORAL_CAPSULE | ORAL | 3 refills | Status: DC

## 2020-09-14 MED ORDER — ALPRAZOLAM 0.5 MG PO TABS: 0.5000 mg | ORAL_TABLET | Freq: Every day | ORAL | 2 refills | Status: DC | PRN

## 2020-09-14 MED ORDER — METHYLPREDNISOLONE 4 MG PO TBPK: ORAL_TABLET | ORAL | 0 refills | Status: DC
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
## 2020-09-15 ENCOUNTER — Encounter (HOSPITAL_COMMUNITY): Payer: Self-pay | Admitting: Internal Medicine

## 2020-09-15 ENCOUNTER — Ambulatory Visit (HOSPITAL_COMMUNITY)
Admission: RE | Admit: 2020-09-15 | Discharge: 2020-09-15 | Disposition: A | Discharge status: Home / Self Care | Payer: Medicare Other | Source: Ambulatory Visit | Attending: Internal Medicine | Admitting: Internal Medicine

## 2020-09-15 VITALS — BP 102/60 | HR 82 | Wt 273.8 lb

## 2020-09-15 DIAGNOSIS — I340 Nonrheumatic mitral (valve) insufficiency: Secondary | ICD-10-CM | POA: Diagnosis not present

## 2020-09-15 DIAGNOSIS — G4733 Obstructive sleep apnea (adult) (pediatric): Secondary | ICD-10-CM | POA: Diagnosis not present

## 2020-09-15 DIAGNOSIS — E785 Hyperlipidemia, unspecified: Secondary | ICD-10-CM | POA: Diagnosis not present

## 2020-09-15 DIAGNOSIS — Z7951 Long term (current) use of inhaled steroids: Secondary | ICD-10-CM | POA: Diagnosis not present

## 2020-09-15 DIAGNOSIS — I447 Left bundle-branch block, unspecified: Secondary | ICD-10-CM | POA: Diagnosis not present

## 2020-09-15 DIAGNOSIS — Z79899 Other long term (current) drug therapy: Secondary | ICD-10-CM | POA: Diagnosis not present

## 2020-09-15 DIAGNOSIS — Z7982 Long term (current) use of aspirin: Secondary | ICD-10-CM | POA: Insufficient documentation

## 2020-09-15 DIAGNOSIS — I110 Hypertensive heart disease with heart failure: Secondary | ICD-10-CM | POA: Diagnosis not present

## 2020-09-15 DIAGNOSIS — I428 Other cardiomyopathies: Secondary | ICD-10-CM | POA: Insufficient documentation

## 2020-09-15 DIAGNOSIS — I5022 Chronic systolic (congestive) heart failure: Secondary | ICD-10-CM | POA: Diagnosis present

## 2020-09-15 DIAGNOSIS — E669 Obesity, unspecified: Secondary | ICD-10-CM | POA: Insufficient documentation

## 2020-09-15 HISTORY — DX: Heart failure, unspecified: I50.9

## 2020-09-15 LAB — BRAIN NATRIURETIC PEPTIDE: B Natriuretic Peptide: 92.1 pg/mL (ref 0.0–100.0)

## 2020-09-15 LAB — BASIC METABOLIC PANEL
Anion gap: 9 (ref 5–15)
BUN: 10 mg/dL (ref 6–20)
CO2: 25 mmol/L (ref 22–32)
Calcium: 9 mg/dL (ref 8.9–10.3)
Chloride: 104 mmol/L (ref 98–111)
Creatinine, Ser: 1.11 mg/dL (ref 0.61–1.24)
GFR, Estimated: >60 mL/min (ref >60)
Glucose, Bld: 112 mg/dL — ABNORMAL HIGH (ref 70–99)
Potassium: 3.8 mmol/L (ref 3.5–5.1)
Sodium: 138 mmol/L (ref 135–145)

## 2020-09-15 LAB — CBC
HCT: 61 % — ABNORMAL HIGH (ref 39.0–52.0)
Hemoglobin: 20.3 g/dL — ABNORMAL HIGH (ref 13.0–17.0)
MCH: 31.4 pg (ref 26.0–34.0)
MCHC: 33.3 g/dL (ref 30.0–36.0)
MCV: 94.4 fL (ref 80.0–100.0)
Platelets: 214 10*3/uL (ref 150–400)
RBC: 6.46 MIL/uL — ABNORMAL HIGH (ref 4.22–5.81)
RDW: 12.6 % (ref 11.5–15.5)
WBC: 5.1 10*3/uL (ref 4.0–10.5)
nRBC: 0 % (ref 0.0–0.2)
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
## 2020-09-15 NOTE — Patient Instructions (Signed)
Labs done today, your results will be available in MyChart, we will contact you for abnormal readings.  Your physician recommends that you schedule a follow-up appointment in: 4 months with an echocardiogram  If you have any questions or concerns before your next appointment please send us a message through mychart or call our office at [phone].    TO LEAVE A MESSAGE FOR THE NURSE SELECT OPTION 2, PLEASE LEAVE A MESSAGE INCLUDING: . YOUR NAME . DATE OF BIRTH . CALL BACK NUMBER . REASON FOR CALL**this is important as we prioritize the call backs  YOU WILL RECEIVE A CALL BACK THE SAME DAY AS LONG AS YOU CALL BEFORE 4:00 PM  At the [facility], you and your health needs are our priority. As part of our continuing mission to provide you with exceptional heart care, we have created designated Provider Care Teams. These Care Teams include your primary Cardiologist (physician) and Advanced Practice Providers (APPs- Physician Assistants and Nurse Practitioners) who all work together to provide you with the care you need, when you need it.   You may see any of the following providers on your designated Care Team at your next follow up: . Dr Daniel Bensimhon . Dr Dalton McLean . Dr Brandon Winfrey . Amy Clegg, NP . Brittainy Simmons, PA . Jessica Milford,NP . Lauren Kemp, PharmD   Please be sure to bring in all your medications bottles to every appointment.     

## 2020-09-15 NOTE — Addendum Note (Signed)
Encounter addended by: Jolaine Artist, MD on: 09/15/2020 12:23 PM  Actions taken: Visit diagnoses modified, Level of Service modified

## 2020-09-27 ENCOUNTER — Telehealth (HOSPITAL_COMMUNITY): Payer: Self-pay | Admitting: *Deleted

## 2020-09-27 DIAGNOSIS — D751 Secondary polycythemia: Secondary | ICD-10-CM

## 2020-09-27 NOTE — Telephone Encounter (Signed)
-----   Message from Jolaine Artist, MD sent at 09/16/2020 11:52 AM EDT ----- Hgb 20.  Please refer to hematology for polycythemia work-up

## 2020-09-27 NOTE — Telephone Encounter (Signed)
Pt aware, referral placed

## 2020-09-28 ENCOUNTER — Telehealth: Payer: Self-pay | Admitting: Hematology and Oncology

## 2020-09-28 NOTE — Telephone Encounter (Signed)
Received a new hem referral from Dr. [name] for polycythemia. [name] has been cld and scheduled to see Dr. Chryl Heck on 5/26 at 920am. Pt aware to arrive 20 minutes early.

## 2020-09-29 ENCOUNTER — Encounter: Payer: Self-pay | Admitting: Hematology and Oncology

## 2020-09-29 ENCOUNTER — Other Ambulatory Visit: Payer: Self-pay

## 2020-09-29 ENCOUNTER — Telehealth: Payer: Self-pay | Admitting: Hematology and Oncology

## 2020-09-29 ENCOUNTER — Inpatient Hospital Stay: Payer: Medicare Other | Attending: Hematology and Oncology | Admitting: Hematology and Oncology

## 2020-09-29 ENCOUNTER — Inpatient Hospital Stay: Payer: Medicare Other

## 2020-09-29 VITALS — BP 111/84 | HR 92 | Temp 97.9°F | Resp 18 | Ht 73.0 in | Wt 281.3 lb

## 2020-09-29 DIAGNOSIS — D751 Secondary polycythemia: Secondary | ICD-10-CM | POA: Insufficient documentation

## 2020-09-29 DIAGNOSIS — I429 Cardiomyopathy, unspecified: Secondary | ICD-10-CM | POA: Insufficient documentation

## 2020-09-29 DIAGNOSIS — Z79899 Other long term (current) drug therapy: Secondary | ICD-10-CM | POA: Insufficient documentation

## 2020-09-29 DIAGNOSIS — I509 Heart failure, unspecified: Secondary | ICD-10-CM | POA: Diagnosis not present

## 2020-09-29 DIAGNOSIS — I110 Hypertensive heart disease with heart failure: Secondary | ICD-10-CM | POA: Diagnosis not present

## 2020-09-29 LAB — CBC WITH DIFFERENTIAL/PLATELET
Abs Immature Granulocytes: 0.01 10*3/uL (ref 0.00–0.07)
Basophils Absolute: 0.1 10*3/uL (ref 0.0–0.1)
Basophils Relative: 1 %
Eosinophils Absolute: 0.3 10*3/uL (ref 0.0–0.5)
Eosinophils Relative: 4 %
HCT: 54.1 % — ABNORMAL HIGH (ref 39.0–52.0)
Hemoglobin: 18.4 g/dL — ABNORMAL HIGH (ref 13.0–17.0)
Immature Granulocytes: 0 %
Lymphocytes Relative: 20 %
Lymphs Abs: 1.3 10*3/uL (ref 0.7–4.0)
MCH: 31.2 pg (ref 26.0–34.0)
MCHC: 34 g/dL (ref 30.0–36.0)
MCV: 91.7 fL (ref 80.0–100.0)
Monocytes Absolute: 0.8 10*3/uL (ref 0.1–1.0)
Monocytes Relative: 12 %
Neutro Abs: 4.3 10*3/uL (ref 1.7–7.7)
Neutrophils Relative %: 63 %
Platelets: 171 10*3/uL (ref 150–400)
RBC: 5.9 MIL/uL — ABNORMAL HIGH (ref 4.22–5.81)
RDW: 13.3 % (ref 11.5–15.5)
WBC: 6.7 10*3/uL (ref 4.0–10.5)
nRBC: 0 % (ref 0.0–0.2)

## 2020-09-29 LAB — CMP (CANCER CENTER ONLY)
ALT: 14 U/L (ref 0–44)
AST: 18 U/L (ref 15–41)
Albumin: 4 g/dL (ref 3.5–5.0)
Alkaline Phosphatase: 47 U/L (ref 38–126)
Anion gap: 11 (ref 5–15)
BUN: 14 mg/dL (ref 6–20)
CO2: 25 mmol/L (ref 22–32)
Calcium: 9.1 mg/dL (ref 8.9–10.3)
Chloride: 105 mmol/L (ref 98–111)
Creatinine: 1.08 mg/dL (ref 0.61–1.24)
GFR, Estimated: >60 mL/min (ref >60)
Glucose, Bld: 134 mg/dL — ABNORMAL HIGH (ref 70–99)
Potassium: 3.3 mmol/L — ABNORMAL LOW (ref 3.5–5.1)
Sodium: 141 mmol/L (ref 135–145)
Total Bilirubin: 1.6 mg/dL — ABNORMAL HIGH (ref 0.3–1.2)
Total Protein: 7.3 g/dL (ref 6.5–8.1)

## 2020-09-29 NOTE — Assessment & Plan Note (Signed)
This is a very pleasant 60 year old male patient with past medical history significant for congestive heart failure and cardiomyopathy, testosterone supplementation, chronic osteomyelitis referred to hematology for evaluation of severe polycythemia.  I discussed with the patient the differential diagnosis of polycythemia 1. Primary polycythemia due to clonal stem cell abnormality 2. Secondary polycythemia due to cause that include hypoxia, heart or lung problems, altitude, athletics, erythropoietin producing lesion/tumors and testosterone supplementation  Recommendation:  1. MPN work-up to evaluate polycythemia vera 2. Erythropoietin level 3.  Consider reducing testosterone dose, he will discuss with his prescribing physician  Indications for phlebotomy  1. Primary polycythemia with hematocrit over 45 2. Secondary polycythemia with severe symptoms which include strokelike symptoms, severe recurrent headaches, severe fatigue or if HCT is greater than 54.  Given his hematocrit greater than 54, we have arranged for phlebotomy at the soonest available appointment.  He does not appear to have any evidence of hyperviscosity symptoms today.  He understands that with severe polycythemia, there is increased risk of stroke, cardiovascular events, DVT/PE.  If the JAK-2 mutation is normal and erythropoietin level is normal, a bone marrow biopsy may be considered. If the erythropoietin is elevated, he will need ultrasound of the liver and kidney for further evaluation.

## 2020-09-29 NOTE — Telephone Encounter (Signed)
Scheduled follow-up appointments per 5/26 los. Patient is aware.
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
## 2020-10-01 LAB — ERYTHROPOIETIN: Erythropoietin: 16.5 m[IU]/mL (ref 2.6–18.5)

## 2020-10-06 LAB — JAK2 (INCLUDING V617F AND EXON 12), MPL,& CALR W/RFL MPN PANEL (NGS)

## 2020-10-11 ENCOUNTER — Inpatient Hospital Stay: Payer: Medicare Other | Attending: Hematology and Oncology

## 2020-10-11 ENCOUNTER — Other Ambulatory Visit: Payer: Self-pay

## 2020-10-11 VITALS — BP 123/95 | HR 84 | Temp 97.7°F | Resp 18

## 2020-10-11 DIAGNOSIS — D751 Secondary polycythemia: Secondary | ICD-10-CM | POA: Diagnosis present

## 2020-10-11 NOTE — Patient Instructions (Signed)

## 2020-10-11 NOTE — Progress Notes (Signed)
First Phlebotomy to left forearm, 18g. Pt tolerated well. 510g removed. Beverage and snack provided. BP 98/72. Pt stated BP has been running low and he plans to contact his cadiologist. Pt d/c in stable condition.

## 2020-10-13 ENCOUNTER — Other Ambulatory Visit: Payer: Self-pay | Admitting: Hematology and Oncology

## 2020-10-13 ENCOUNTER — Inpatient Hospital Stay: Payer: Medicare Other | Admitting: Hematology and Oncology

## 2020-10-13 ENCOUNTER — Other Ambulatory Visit: Payer: Self-pay

## 2020-10-13 ENCOUNTER — Inpatient Hospital Stay (HOSPITAL_BASED_OUTPATIENT_CLINIC_OR_DEPARTMENT_OTHER): Payer: Medicare Other | Admitting: Hematology and Oncology

## 2020-10-13 ENCOUNTER — Encounter: Payer: Self-pay | Admitting: Hematology and Oncology

## 2020-10-13 VITALS — BP 109/81 | HR 91 | Temp 96.5°F | Resp 18

## 2020-10-13 DIAGNOSIS — D751 Secondary polycythemia: Secondary | ICD-10-CM

## 2020-10-13 LAB — CBC WITH DIFFERENTIAL/PLATELET
Abs Immature Granulocytes: 0.12 10*3/uL — ABNORMAL HIGH (ref 0.00–0.07)
Basophils Absolute: 0.1 10*3/uL (ref 0.0–0.1)
Basophils Relative: 1 %
Eosinophils Absolute: 0 10*3/uL (ref 0.0–0.5)
Eosinophils Relative: 0 %
HCT: 55.6 % — ABNORMAL HIGH (ref 39.0–52.0)
Hemoglobin: 19 g/dL — ABNORMAL HIGH (ref 13.0–17.0)
Immature Granulocytes: 1 %
Lymphocytes Relative: 10 %
Lymphs Abs: 1.1 10*3/uL (ref 0.7–4.0)
MCH: 31.4 pg (ref 26.0–34.0)
MCHC: 34.2 g/dL (ref 30.0–36.0)
MCV: 91.7 fL (ref 80.0–100.0)
Monocytes Absolute: 0.7 10*3/uL (ref 0.1–1.0)
Monocytes Relative: 6 %
Neutro Abs: 9.4 10*3/uL — ABNORMAL HIGH (ref 1.7–7.7)
Neutrophils Relative %: 82 %
Platelets: 208 10*3/uL (ref 150–400)
RBC: 6.06 MIL/uL — ABNORMAL HIGH (ref 4.22–5.81)
RDW: 13.5 % (ref 11.5–15.5)
WBC: 11.4 10*3/uL — ABNORMAL HIGH (ref 4.0–10.5)
nRBC: 0 % (ref 0.0–0.2)
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
## 2020-10-14 ENCOUNTER — Telehealth: Payer: Self-pay

## 2020-10-14 NOTE — Telephone Encounter (Signed)
This nurse reached out to patient per Dr. Chryl Heck.  Informed about persistent polycythemia and bone marrow biopsy recommendation.  Patient stated he would like to speak with his family concerning the procedure and will give [place] a call back.

## 2020-10-14 NOTE — Telephone Encounter (Signed)
This nurse spoke with patient who is in agreement with doing a bone marrow biopsy.  This nurse informed him that the provider will be made aware and scheduling will reach out to him concerning a date and time for the biopsy.  Patient knows to call clinic with any problems, questons or concerns.

## 2020-10-14 NOTE — Telephone Encounter (Signed)
-----   Message from Bo Mcclintock, RN sent at 10/14/2020 11:41 AM EDT -----  ----- Message ----- From: Benay Pike, MD Sent: 10/14/2020  11:36 AM EDT To: Bo Mcclintock, RN  I tried calling the patient to discuss that we should likely proceed with BMB given persistent polycythemia despite being off of testosterone. He didn't answer, voicemail said, dont leave a message. Can you please call him and tell him that we will proceed with BMB. He knows this is a possibility. If he agrees, let me know, I will talk to [place] about it.  Thanks so much.

## 2020-10-14 NOTE — Telephone Encounter (Signed)
-----   Message from Bo Mcclintock, RN sent at 10/14/2020 11:41 AM EDT -----  ----- Message ----- From: Benay Pike, MD Sent: 10/14/2020  11:36 AM EDT To: Bo Mcclintock, RN  I tried calling the patient to discuss that we should likely proceed with BMB given persistent polycythemia despite being off of testosterone. He didn't answer, voicemail said, dont leave a message. Can you please call him and tell him that we will proceed with BMB. He knows this is a possibility. If he agrees, let me know, I will talk to [place] about it.  Thanks so much.

## 2020-10-17 ENCOUNTER — Telehealth: Payer: Self-pay | Admitting: Hematology and Oncology

## 2020-10-17 ENCOUNTER — Other Ambulatory Visit: Payer: Self-pay | Admitting: Hematology and Oncology

## 2020-10-17 DIAGNOSIS — D751 Secondary polycythemia: Secondary | ICD-10-CM

## 2020-10-17 NOTE — Telephone Encounter (Signed)
Scheduled appointment per 06/13 sch msg. Patient is aware.

## 2020-10-17 NOTE — Progress Notes (Signed)
I called patient. Discussed about phlebotomy again, however he is in [place]. He denies any hyperviscosity symptoms currently He was advised to get a phlebotomy or blood donation immediately. He will do his best Encouraged to go to the ER if any symptoms concerning for hyperviscosity, we have discussed about hyperviscosity symptoms. We will also schedule another phlebotomy 1st week of July

## 2020-10-17 NOTE — Progress Notes (Signed)
CT guided BMB ordered.

## 2020-10-18 ENCOUNTER — Encounter: Payer: Self-pay | Admitting: Hematology and Oncology

## 2020-10-18 NOTE — Progress Notes (Signed)
This encounter was created in error - please disregard.

## 2020-10-21 ENCOUNTER — Telehealth: Payer: Self-pay

## 2020-10-21 NOTE — Telephone Encounter (Signed)
Returned patient's call and addressed patient's concerns for increased work of breathing. Patient stated that he has experienced dyspnea with exertion the past 3 days. Patient noted he had mowed the lawn on Tuesday and felt well, but the following days he has experienced more dyspnea and fatigue. Dr. Chryl Heck made aware of these symptoms and recommends patient to be evaluated at nearest facility. Patient is currently out-of-state caring for a family member.  Relayed these recommendations to the patient. Patient stated he would get evaluated if he continued to feel poorly tomorrow. He states that he is currently resting and utilizing his furosemide pills. Advised patient to seek immediate medical attention should he experience acute shortness of breath and/or chest pain. Patient denies any of these symptoms at this time, but verbalized an understanding to call 911 or proceed to the nearest emergency room should he develop these symptoms.  Patient had no further questions or concerns at conclusion of phone call.

## 2020-10-25 ENCOUNTER — Telehealth (HOSPITAL_COMMUNITY): Payer: Self-pay | Admitting: *Deleted

## 2020-10-25 NOTE — Telephone Encounter (Signed)
Pt left vm stating he is very fatigued, no energy, and having a hard time breathing. Pt took covid test and it was negative. Pt is still in [place] and said he will more than likely go to the emergency room if he doesn't feel better tomorrow. He wanted our office to be aware.

## 2020-11-02 ENCOUNTER — Telehealth: Payer: Self-pay | Admitting: Hematology and Oncology

## 2020-11-02 ENCOUNTER — Other Ambulatory Visit: Payer: Self-pay

## 2020-11-02 NOTE — Telephone Encounter (Signed)
R/s appt per 6/29 sch msg. Pt aware.  

## 2020-11-04 ENCOUNTER — Inpatient Hospital Stay: Payer: Medicare Other

## 2020-11-09 ENCOUNTER — Other Ambulatory Visit: Payer: Self-pay | Admitting: Radiology

## 2020-11-10 ENCOUNTER — Other Ambulatory Visit: Payer: Self-pay

## 2020-11-10 ENCOUNTER — Ambulatory Visit (HOSPITAL_COMMUNITY)
Admission: RE | Admit: 2020-11-10 | Discharge: 2020-11-10 | Disposition: A | Discharge status: Home / Self Care | Payer: Medicare Other | Source: Ambulatory Visit | Attending: Hematology and Oncology | Admitting: Hematology and Oncology

## 2020-11-10 ENCOUNTER — Encounter (HOSPITAL_COMMUNITY): Payer: Self-pay

## 2020-11-10 DIAGNOSIS — D7589 Other specified diseases of blood and blood-forming organs: Secondary | ICD-10-CM | POA: Diagnosis not present

## 2020-11-10 DIAGNOSIS — Z7984 Long term (current) use of oral hypoglycemic drugs: Secondary | ICD-10-CM | POA: Insufficient documentation

## 2020-11-10 DIAGNOSIS — Z79890 Hormone replacement therapy (postmenopausal): Secondary | ICD-10-CM | POA: Insufficient documentation

## 2020-11-10 DIAGNOSIS — Z79899 Other long term (current) drug therapy: Secondary | ICD-10-CM | POA: Diagnosis not present

## 2020-11-10 DIAGNOSIS — D751 Secondary polycythemia: Secondary | ICD-10-CM | POA: Insufficient documentation

## 2020-11-10 DIAGNOSIS — M8660 Other chronic osteomyelitis, unspecified site: Secondary | ICD-10-CM | POA: Diagnosis not present

## 2020-11-10 DIAGNOSIS — I509 Heart failure, unspecified: Secondary | ICD-10-CM | POA: Diagnosis not present

## 2020-11-10 DIAGNOSIS — Z7982 Long term (current) use of aspirin: Secondary | ICD-10-CM | POA: Diagnosis not present

## 2020-11-10 LAB — CBC WITH DIFFERENTIAL/PLATELET
Abs Immature Granulocytes: 0.04 10*3/uL (ref 0.00–0.07)
Basophils Absolute: 0.1 10*3/uL (ref 0.0–0.1)
Basophils Relative: 1 %
Eosinophils Absolute: 0.3 10*3/uL (ref 0.0–0.5)
Eosinophils Relative: 4 %
HCT: 52.2 % — ABNORMAL HIGH (ref 39.0–52.0)
Hemoglobin: 17.3 g/dL — ABNORMAL HIGH (ref 13.0–17.0)
Immature Granulocytes: 1 %
Lymphocytes Relative: 12 %
Lymphs Abs: 0.9 10*3/uL (ref 0.7–4.0)
MCH: 31.3 pg (ref 26.0–34.0)
MCHC: 33.1 g/dL (ref 30.0–36.0)
MCV: 94.6 fL (ref 80.0–100.0)
Monocytes Absolute: 1 10*3/uL (ref 0.1–1.0)
Monocytes Relative: 13 %
Neutro Abs: 5.2 10*3/uL (ref 1.7–7.7)
Neutrophils Relative %: 69 %
Platelets: 209 10*3/uL (ref 150–400)
RBC: 5.52 MIL/uL (ref 4.22–5.81)
RDW: 13.8 % (ref 11.5–15.5)
WBC: 7.4 10*3/uL (ref 4.0–10.5)
nRBC: 0 % (ref 0.0–0.2)

## 2020-11-10 MED ORDER — MIDAZOLAM HCL 2 MG/2ML IJ SOLN
INTRAMUSCULAR | Status: AC | PRN
  Administered 2020-11-10: 2 mg via INTRAVENOUS
  Administered 2020-11-10: 1 mg via INTRAVENOUS

## 2020-11-10 MED ORDER — LIDOCAINE HCL (PF) 1 % IJ SOLN
INTRAMUSCULAR | Status: AC | PRN
  Administered 2020-11-10: 10 mL

## 2020-11-10 MED ORDER — MIDAZOLAM HCL 2 MG/2ML IJ SOLN
INTRAMUSCULAR | Status: AC
  Filled 2020-11-10: qty 4

## 2020-11-10 MED ORDER — SODIUM CHLORIDE 0.9 % IV SOLN: INTRAVENOUS | Status: DC

## 2020-11-10 MED ORDER — FENTANYL CITRATE (PF) 100 MCG/2ML IJ SOLN
INTRAMUSCULAR | Status: AC
  Filled 2020-11-10: qty 2

## 2020-11-10 MED ORDER — FENTANYL CITRATE (PF) 100 MCG/2ML IJ SOLN
INTRAMUSCULAR | Status: AC | PRN
  Administered 2020-11-10 (×2): 50 ug via INTRAVENOUS

## 2020-11-10 NOTE — Procedures (Signed)
Pre-procedure Diagnosis: Polycythemia of uncertain etiology Post-procedure Diagnosis: Same  Technically successful CT guided bone marrow aspiration and biopsy of left iliac crest.   Complications: None Immediate  EBL: None  Signed: Sandi Mariscal Pager: [phone] 11/10/2020, 9:35 AM
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
## 2020-11-10 NOTE — Discharge Instructions (Signed)
Please call Interventional Radiology clinic [phone] with any questions or concerns. ° °You may remove your dressing and shower tomorrow. ° ° °Bone Marrow Aspiration and Bone Marrow Biopsy, Adult, Care After °This sheet gives you information about how to care for yourself after your procedure. Your health care provider may also give you more specific instructions. If you have problems or questions, contact your health care provider. °What can I expect after the procedure? °After the procedure, it is common to have: °Mild pain and tenderness. °Swelling. °Bruising. °Follow these instructions at home: °Puncture site care °Follow instructions from your health care provider about how to take care of the puncture site. Make sure you: °Wash your hands with soap and water before and after you change your bandage (dressing). If soap and water are not available, use hand sanitizer. °Change your dressing as told by your health care provider. °Check your puncture site every day for signs of infection. Check for: °More redness, swelling, or pain. °Fluid or blood. °Warmth. °Pus or a bad smell.   °Activity °Return to your normal activities as told by your health care provider. Ask your health care provider what activities are safe for you. °Do not lift anything that is heavier than 10 lb (4.5 kg), or the limit that you are told, until your health care provider says that it is safe. °Do not drive for 24 hours if you were given a sedative during your procedure. °General instructions °Take over-the-counter and prescription medicines only as told by your health care provider. °Do not take baths, swim, or use a hot tub until your health care provider approves. Ask your health care provider if you may take showers. You may only be allowed to take sponge baths. °If directed, put ice on the affected area. To do this: °Put ice in a plastic bag. °Place a towel between your skin and the bag. °Leave the ice on for 20 minutes, 2-3 times a  day. °Keep all follow-up visits as told by your health care provider. This is important.   °Contact a health care provider if: °Your pain is not controlled with medicine. °You have a fever. °You have more redness, swelling, or pain around the puncture site. °You have fluid or blood coming from the puncture site. °Your puncture site feels warm to the touch. °You have pus or a bad smell coming from the puncture site. °Summary °After the procedure, it is common to have mild pain, tenderness, swelling, and bruising. °Follow instructions from your health care provider about how to take care of the puncture site and what activities are safe for you. °Take over-the-counter and prescription medicines only as told by your health care provider. °Contact a health care provider if you have any signs of infection, such as fluid or blood coming from the puncture site. °This information is not intended to replace advice given to you by your health care provider. Make sure you discuss any questions you have with your health care provider. °Document Revised: 09/09/2018 Document Reviewed: 09/09/2018 °Elsevier Patient Education © 2021 [employer] Inc[employer] ° ° °Moderate Conscious Sedation, Adult, Care After °This sheet gives you information about how to care for yourself after your procedure. Your health care provider may also give you more specific instructions. If you have problems or questions, contact your health care provider. °What can I expect after the procedure? °After the procedure, it is common to have: °Sleepiness for several hours. °Impaired judgment for several hours. °Difficulty with balance. °Vomiting if you eat too   soon. °Follow these instructions at home: °For the time period you were told by your health care provider: °Rest. °Do not participate in activities where you could fall or become injured. °Do not drive or use machinery. °Do not drink alcohol. °Do not take sleeping pills or medicines that cause drowsiness. °Do not  make important decisions or sign legal documents. °Do not take care of children on your own.  °  °  °Eating and drinking °Follow the diet recommended by your health care provider. °Drink enough fluid to keep your urine pale yellow. °If you vomit: °Drink water, juice, or soup when you can drink without vomiting. °Make sure you have little or no nausea before eating solid foods.   °General instructions °Take over-the-counter and prescription medicines only as told by your health care provider. °Have a responsible adult stay with you for the time you are told. It is important to have someone help care for you until you are awake and alert. °Do not smoke. °Keep all follow-up visits as told by your health care provider. This is important. °Contact a health care provider if: °You are still sleepy or having trouble with balance after 24 hours. °You feel light-headed. °You keep feeling nauseous or you keep vomiting. °You develop a rash. °You have a fever. °You have redness or swelling around the IV site. °Get help right away if: °You have trouble breathing. °You have new-onset confusion at home. °Summary °After the procedure, it is common to feel sleepy, have impaired judgment, or feel nauseous if you eat too soon. °Rest after you get home. Know the things you should not do after the procedure. °Follow the diet recommended by your health care provider and drink enough fluid to keep your urine pale yellow. °Get help right away if you have trouble breathing or new-onset confusion at home. °This information is not intended to replace advice given to you by your health care provider. Make sure you discuss any questions you have with your health care provider. °Document Revised: 08/21/2019 Document Reviewed: 03/19/2019 °Elsevier Patient Education © 2021 Elsevier Inc.  °

## 2020-11-11 LAB — SURGICAL PATHOLOGY
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
## 2020-11-15 ENCOUNTER — Other Ambulatory Visit: Payer: Self-pay

## 2020-11-15 ENCOUNTER — Inpatient Hospital Stay: Payer: Medicare Other | Attending: Hematology and Oncology

## 2020-11-15 ENCOUNTER — Inpatient Hospital Stay (HOSPITAL_BASED_OUTPATIENT_CLINIC_OR_DEPARTMENT_OTHER): Payer: Medicare Other | Admitting: Hematology and Oncology

## 2020-11-15 ENCOUNTER — Telehealth: Payer: Self-pay

## 2020-11-15 ENCOUNTER — Encounter: Payer: Self-pay | Admitting: Hematology and Oncology

## 2020-11-15 VITALS — BP 106/74 | Temp 96.5°F | Resp 18 | Wt 276.1 lb

## 2020-11-15 DIAGNOSIS — I509 Heart failure, unspecified: Secondary | ICD-10-CM | POA: Diagnosis not present

## 2020-11-15 DIAGNOSIS — I110 Hypertensive heart disease with heart failure: Secondary | ICD-10-CM | POA: Diagnosis not present

## 2020-11-15 DIAGNOSIS — D751 Secondary polycythemia: Secondary | ICD-10-CM

## 2020-11-15 LAB — CBC WITH DIFFERENTIAL/PLATELET
Abs Immature Granulocytes: 0.02 10*3/uL (ref 0.00–0.07)
Basophils Absolute: 0.1 10*3/uL (ref 0.0–0.1)
Basophils Relative: 1 %
Eosinophils Absolute: 0.3 10*3/uL (ref 0.0–0.5)
Eosinophils Relative: 4 %
HCT: 51.6 % (ref 39.0–52.0)
Hemoglobin: 17.6 g/dL — ABNORMAL HIGH (ref 13.0–17.0)
Immature Granulocytes: 0 %
Lymphocytes Relative: 24 %
Lymphs Abs: 1.4 10*3/uL (ref 0.7–4.0)
MCH: 31.5 pg (ref 26.0–34.0)
MCHC: 34.1 g/dL (ref 30.0–36.0)
MCV: 92.5 fL (ref 80.0–100.0)
Monocytes Absolute: 0.7 10*3/uL (ref 0.1–1.0)
Monocytes Relative: 12 %
Neutro Abs: 3.3 10*3/uL (ref 1.7–7.7)
Neutrophils Relative %: 59 %
Platelets: 192 10*3/uL (ref 150–400)
RBC: 5.58 MIL/uL (ref 4.22–5.81)
RDW: 13.5 % (ref 11.5–15.5)
WBC: 5.7 10*3/uL (ref 4.0–10.5)
nRBC: 0 % (ref 0.0–0.2)

## 2020-11-15 NOTE — Telephone Encounter (Signed)
Called and spoke with patient regarding missed lab appointment and office visit from today, 7/12. Patient stated he did not know he had appointments. Lab appt rescheduled for later this morning, MD visit to be rescheduled. Will follow-up.

## 2020-11-16 ENCOUNTER — Inpatient Hospital Stay: Payer: Medicare Other

## 2020-11-16 ENCOUNTER — Other Ambulatory Visit: Payer: Self-pay | Admitting: Interventional Cardiology

## 2020-11-17 ENCOUNTER — Other Ambulatory Visit (HOSPITAL_COMMUNITY): Payer: Self-pay | Admitting: Internal Medicine

## 2020-11-17 ENCOUNTER — Encounter (HOSPITAL_COMMUNITY): Payer: Self-pay | Admitting: Hematology and Oncology

## 2020-11-23 ENCOUNTER — Encounter: Payer: Medicare Other | Attending: Physical Medicine and Rehabilitation | Admitting: Registered Nurse

## 2020-11-23 ENCOUNTER — Encounter: Payer: Self-pay | Admitting: Registered Nurse

## 2020-11-23 ENCOUNTER — Other Ambulatory Visit: Payer: Self-pay

## 2020-11-23 VITALS — BP 111/70 | HR 90 | Temp 97.8°F | Ht 73.0 in | Wt 279.2 lb

## 2020-11-23 DIAGNOSIS — S8412XS Injury of peroneal nerve at lower leg level, left leg, sequela: Secondary | ICD-10-CM | POA: Insufficient documentation

## 2020-11-23 DIAGNOSIS — G894 Chronic pain syndrome: Secondary | ICD-10-CM | POA: Insufficient documentation

## 2020-11-23 DIAGNOSIS — Z79891 Long term (current) use of opiate analgesic: Secondary | ICD-10-CM | POA: Insufficient documentation

## 2020-11-23 DIAGNOSIS — M1732 Unilateral post-traumatic osteoarthritis, left knee: Secondary | ICD-10-CM | POA: Diagnosis present

## 2020-11-23 DIAGNOSIS — F418 Other specified anxiety disorders: Secondary | ICD-10-CM | POA: Insufficient documentation

## 2020-11-23 DIAGNOSIS — Z5181 Encounter for therapeutic drug level monitoring: Secondary | ICD-10-CM | POA: Insufficient documentation

## 2020-11-23 MED ORDER — OXYCODONE HCL 15 MG PO TABS: 15.0000 mg | ORAL_TABLET | Freq: Three times a day (TID) | ORAL | 0 refills | Status: DC | PRN

## 2020-11-23 MED ORDER — ALPRAZOLAM 0.5 MG PO TABS: 0.5000 mg | ORAL_TABLET | Freq: Every day | ORAL | 2 refills | Status: DC | PRN
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
## 2020-11-27 LAB — TOXASSURE SELECT,+ANTIDEPR,UR

## 2020-11-30 ENCOUNTER — Other Ambulatory Visit: Payer: Self-pay | Admitting: Interventional Cardiology

## 2020-12-01 ENCOUNTER — Telehealth: Payer: Self-pay | Admitting: *Deleted

## 2020-12-01 NOTE — Telephone Encounter (Signed)
Urine drug screen for this encounter is consistent for prescribed medication 

## 2020-12-13 IMAGING — CR DG KNEE 1-2V*R*
2 series · 2 of 2 positions shown · non-contrast
Comparison: None.

CLINICAL DATA: Right knee pain

EXAM:
RIGHT KNEE - 1-2 VIEW

[w knee ap right]
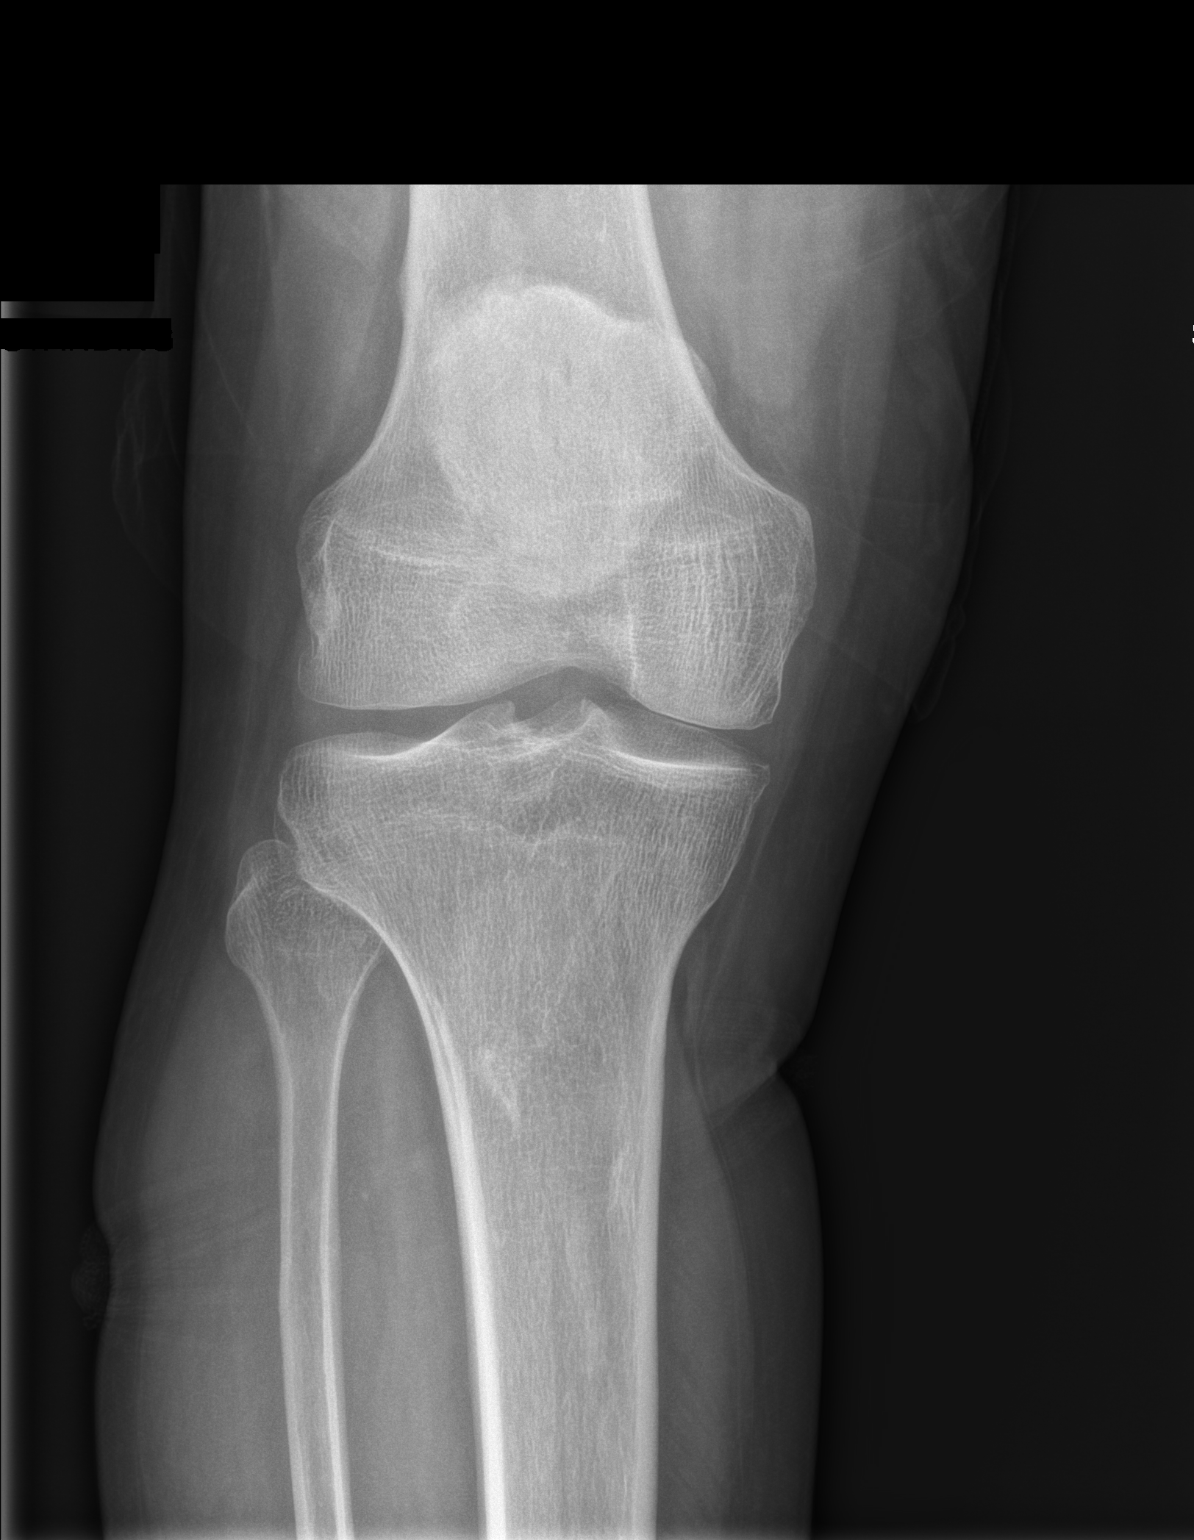

[w knee lat right]
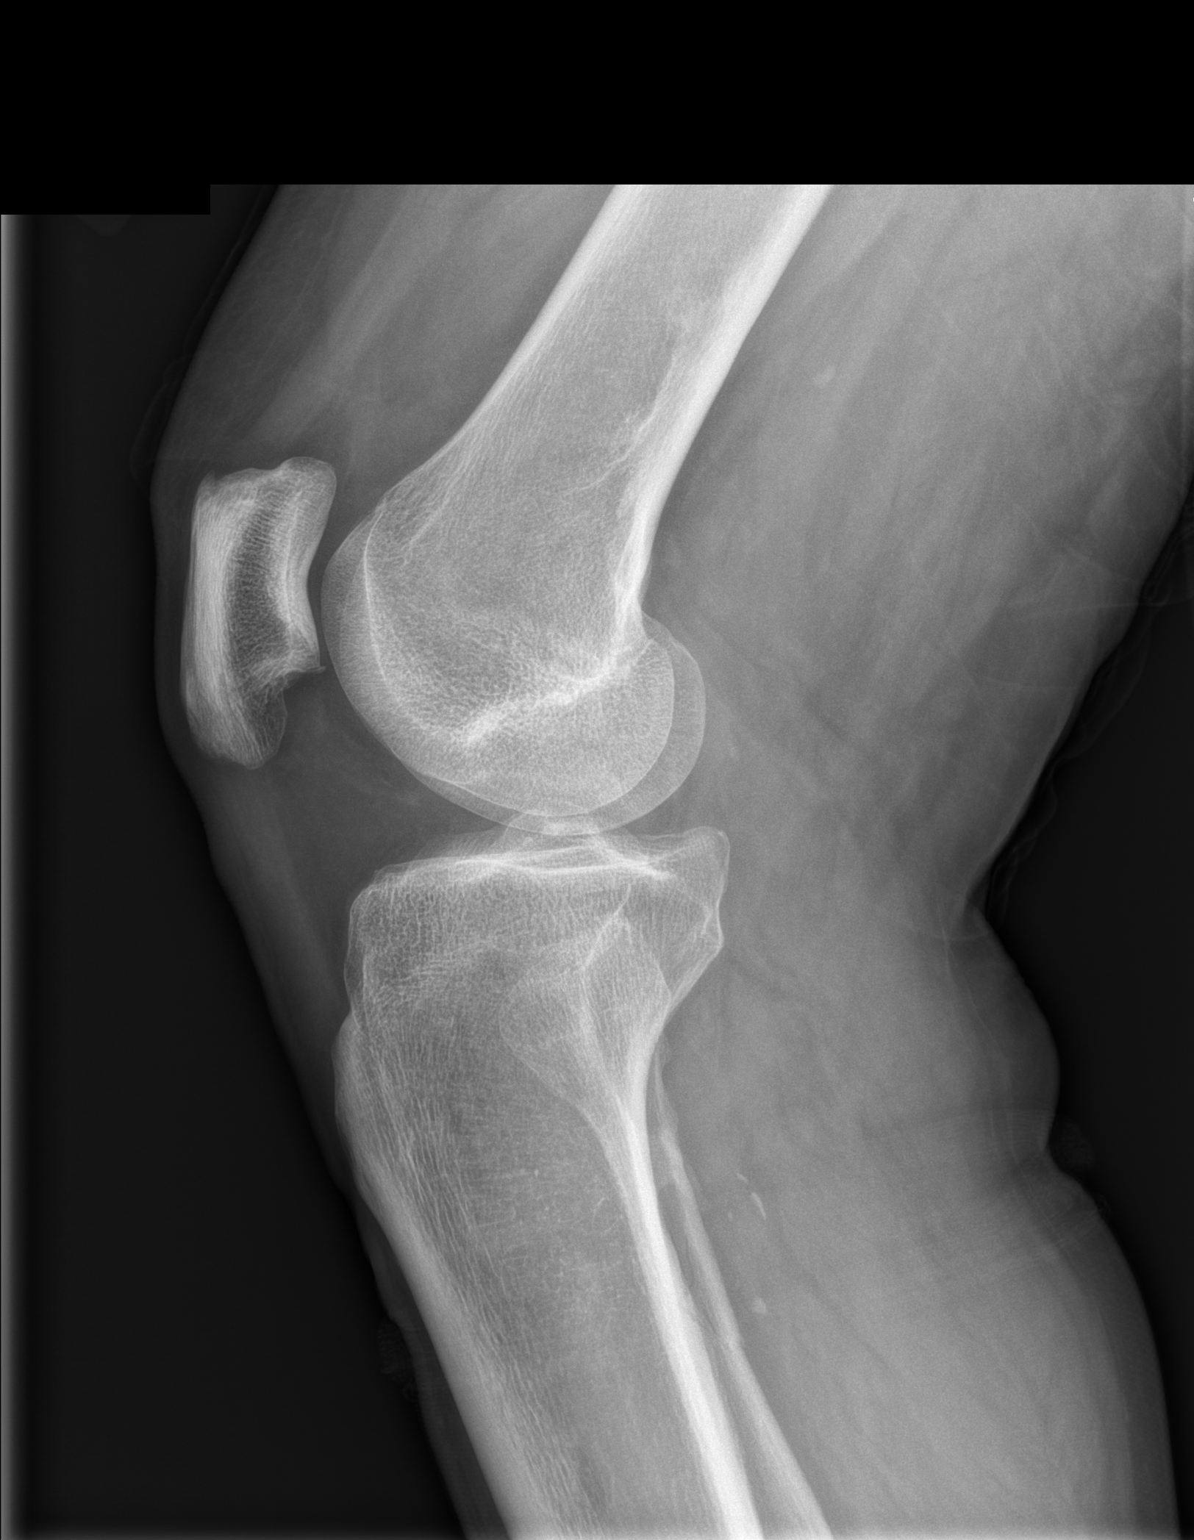

[2 of 2 positions shown; findings below may reference images not displayed]

FINDINGS: Suspected a nondisplaced fracture of the proximal right fibular
diaphysis, best seen on lateral view. No other evidence of fracture
or dislocation of the right knee. Joint spaces are well preserved.
No knee joint effusion. Vascular calcinosis.
IMPRESSION: 1. Suspected a nondisplaced fracture of the proximal right fibular
diaphysis, best seen on lateral view. No other evidence of fracture
or dislocation of the right knee.

2.  Joint spaces are well preserved.  No knee joint effusion.

## 2020-12-14 ENCOUNTER — Other Ambulatory Visit: Payer: Medicare Other

## 2020-12-14 ENCOUNTER — Ambulatory Visit: Payer: Medicare Other

## 2020-12-22 ENCOUNTER — Telehealth: Payer: Self-pay | Admitting: Hematology and Oncology

## 2020-12-22 NOTE — Telephone Encounter (Signed)
Pt called in to r/s lab/Phlebotomy appts. R/s appts per pt request, pt aware.

## 2020-12-27 ENCOUNTER — Other Ambulatory Visit: Payer: Medicare Other

## 2021-01-03 ENCOUNTER — Inpatient Hospital Stay: Payer: Medicare Other | Attending: Hematology and Oncology

## 2021-01-03 ENCOUNTER — Inpatient Hospital Stay: Payer: Medicare Other

## 2021-01-03 ENCOUNTER — Other Ambulatory Visit: Payer: Self-pay

## 2021-01-03 DIAGNOSIS — D751 Secondary polycythemia: Secondary | ICD-10-CM | POA: Insufficient documentation

## 2021-01-03 LAB — CBC WITH DIFFERENTIAL/PLATELET
Abs Immature Granulocytes: 0.02 10*3/uL (ref 0.00–0.07)
Basophils Absolute: 0.1 10*3/uL (ref 0.0–0.1)
Basophils Relative: 1 %
Eosinophils Absolute: 0.3 10*3/uL (ref 0.0–0.5)
Eosinophils Relative: 5 %
HCT: 53.2 % — ABNORMAL HIGH (ref 39.0–52.0)
Hemoglobin: 17.8 g/dL — ABNORMAL HIGH (ref 13.0–17.0)
Immature Granulocytes: 0 %
Lymphocytes Relative: 29 %
Lymphs Abs: 1.7 10*3/uL (ref 0.7–4.0)
MCH: 30.4 pg (ref 26.0–34.0)
MCHC: 33.5 g/dL (ref 30.0–36.0)
MCV: 90.9 fL (ref 80.0–100.0)
Monocytes Absolute: 0.6 10*3/uL (ref 0.1–1.0)
Monocytes Relative: 11 %
Neutro Abs: 3.2 10*3/uL (ref 1.7–7.7)
Neutrophils Relative %: 54 %
Platelets: 162 10*3/uL (ref 150–400)
RBC: 5.85 MIL/uL — ABNORMAL HIGH (ref 4.22–5.81)
RDW: 13.1 % (ref 11.5–15.5)
WBC: 5.9 10*3/uL (ref 4.0–10.5)
nRBC: 0 % (ref 0.0–0.2)

## 2021-01-03 NOTE — Patient Instructions (Signed)

## 2021-01-03 NOTE — Progress Notes (Signed)
HCT 53.2.  Per MD order: ok to proceed if HCT greater than 54.  Per MD, no therapeutic phlebotomy needed.  Pt is aware of his next appointment.

## 2021-01-18 ENCOUNTER — Other Ambulatory Visit: Payer: Medicare Other

## 2021-01-18 ENCOUNTER — Telehealth: Payer: Self-pay

## 2021-01-18 ENCOUNTER — Ambulatory Visit: Payer: Medicare Other

## 2021-01-18 NOTE — Telephone Encounter (Signed)
Patient was scheduled for 0730 lab appt with phlebotomy to follow. Patient did not show for appt. Called to check status of patient. Victor [name] indicated he called earlier to change appt date/time due to a scheduling conflict. Noted appt on 01/25/2021. Patient aware.

## 2021-01-20 ENCOUNTER — Other Ambulatory Visit: Payer: Self-pay | Admitting: Registered Nurse
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
## 2021-01-23 ENCOUNTER — Ambulatory Visit (HOSPITAL_COMMUNITY)
Admission: RE | Admit: 2021-01-23 | Discharge: 2021-01-23 | Disposition: A | Discharge status: Home / Self Care | Payer: Medicare Other | Source: Ambulatory Visit | Attending: Internal Medicine | Admitting: Internal Medicine

## 2021-01-23 ENCOUNTER — Other Ambulatory Visit: Payer: Self-pay

## 2021-01-23 ENCOUNTER — Encounter (HOSPITAL_COMMUNITY): Payer: Self-pay | Admitting: Internal Medicine

## 2021-01-23 ENCOUNTER — Ambulatory Visit (HOSPITAL_BASED_OUTPATIENT_CLINIC_OR_DEPARTMENT_OTHER)
Admission: RE | Admit: 2021-01-23 | Discharge: 2021-01-23 | Disposition: A | Discharge status: Home / Self Care | Payer: Medicare Other | Source: Ambulatory Visit | Attending: Internal Medicine | Admitting: Internal Medicine

## 2021-01-23 VITALS — BP 122/80 | HR 72 | Wt 282.2 lb

## 2021-01-23 DIAGNOSIS — I5022 Chronic systolic (congestive) heart failure: Secondary | ICD-10-CM | POA: Diagnosis not present

## 2021-01-23 DIAGNOSIS — Z7984 Long term (current) use of oral hypoglycemic drugs: Secondary | ICD-10-CM | POA: Insufficient documentation

## 2021-01-23 DIAGNOSIS — I110 Hypertensive heart disease with heart failure: Secondary | ICD-10-CM | POA: Diagnosis not present

## 2021-01-23 DIAGNOSIS — I10 Essential (primary) hypertension: Secondary | ICD-10-CM

## 2021-01-23 DIAGNOSIS — E669 Obesity, unspecified: Secondary | ICD-10-CM | POA: Insufficient documentation

## 2021-01-23 DIAGNOSIS — Z79899 Other long term (current) drug therapy: Secondary | ICD-10-CM | POA: Insufficient documentation

## 2021-01-23 DIAGNOSIS — G4733 Obstructive sleep apnea (adult) (pediatric): Secondary | ICD-10-CM | POA: Diagnosis not present

## 2021-01-23 DIAGNOSIS — Z7951 Long term (current) use of inhaled steroids: Secondary | ICD-10-CM | POA: Diagnosis not present

## 2021-01-23 DIAGNOSIS — E785 Hyperlipidemia, unspecified: Secondary | ICD-10-CM | POA: Diagnosis not present

## 2021-01-23 DIAGNOSIS — D751 Secondary polycythemia: Secondary | ICD-10-CM

## 2021-01-23 DIAGNOSIS — I428 Other cardiomyopathies: Secondary | ICD-10-CM | POA: Diagnosis present

## 2021-01-23 DIAGNOSIS — I080 Rheumatic disorders of both mitral and aortic valves: Secondary | ICD-10-CM | POA: Diagnosis not present

## 2021-01-23 DIAGNOSIS — Z7982 Long term (current) use of aspirin: Secondary | ICD-10-CM | POA: Insufficient documentation

## 2021-01-23 LAB — ECHOCARDIOGRAM COMPLETE
AR max vel: 1.68 cm2
AV Area VTI: 1.53 cm2
AV Area mean vel: 1.53 cm2
AV Mean grad: 3 mmHg
AV Peak grad: 6 mmHg
Ao pk vel: 1.22 m/s
Area-P 1/2: 2.69 cm2
Calc EF: 26.4 %
S' Lateral: 6.9 cm
Single Plane A2C EF: 12.1 %
Single Plane A4C EF: 29.8 %

## 2021-01-23 LAB — CBC
HCT: 56.1 % — ABNORMAL HIGH (ref 39.0–52.0)
Hemoglobin: 18 g/dL — ABNORMAL HIGH (ref 13.0–17.0)
MCH: 30.6 pg (ref 26.0–34.0)
MCHC: 32.1 g/dL (ref 30.0–36.0)
MCV: 95.2 fL (ref 80.0–100.0)
Platelets: 181 10*3/uL (ref 150–400)
RBC: 5.89 MIL/uL — ABNORMAL HIGH (ref 4.22–5.81)
RDW: 13.3 % (ref 11.5–15.5)
WBC: 5.4 10*3/uL (ref 4.0–10.5)
nRBC: 0 % (ref 0.0–0.2)

## 2021-01-23 LAB — COMPREHENSIVE METABOLIC PANEL
ALT: 19 U/L (ref 0–44)
AST: 27 U/L (ref 15–41)
Albumin: 4.2 g/dL (ref 3.5–5.0)
Alkaline Phosphatase: 53 U/L (ref 38–126)
Anion gap: 10 (ref 5–15)
BUN: 11 mg/dL (ref 6–20)
CO2: 23 mmol/L (ref 22–32)
Calcium: 9 mg/dL (ref 8.9–10.3)
Chloride: 105 mmol/L (ref 98–111)
Creatinine, Ser: 1.12 mg/dL (ref 0.61–1.24)
GFR, Estimated: >60 mL/min (ref >60)
Glucose, Bld: 147 mg/dL — ABNORMAL HIGH (ref 70–99)
Potassium: 3.9 mmol/L (ref 3.5–5.1)
Sodium: 138 mmol/L (ref 135–145)
Total Bilirubin: 1.8 mg/dL — ABNORMAL HIGH (ref 0.3–1.2)
Total Protein: 7.1 g/dL (ref 6.5–8.1)

## 2021-01-23 LAB — BRAIN NATRIURETIC PEPTIDE: B Natriuretic Peptide: 119.8 pg/mL — ABNORMAL HIGH (ref 0.0–100.0)

## 2021-01-23 NOTE — Addendum Note (Signed)
Encounter addended by: Scarlette Calico, RN on: 01/23/2021 11:02 AM  Actions taken: Order list changed, Diagnosis association updated, Clinical Note Signed, Charge Capture section accepted

## 2021-01-23 NOTE — Patient Instructions (Signed)
Labs done today, your results will be available in MyChart, we will contact you for abnormal readings.  Please call our office in February 2023 to schedule your follow-up appointment  If you have any questions or concerns before your next appointment please send [place] a message through [place] or call our office at [phone].    TO LEAVE A MESSAGE FOR THE NURSE SELECT OPTION 2, PLEASE LEAVE A MESSAGE INCLUDING: YOUR NAME DATE OF BIRTH CALL BACK NUMBER REASON FOR CALL**this is important as we prioritize the call backs  YOU WILL RECEIVE A CALL BACK THE SAME DAY AS LONG AS YOU CALL BEFORE 4:00 PM  At the [facility], you and your health needs are our priority. As part of our continuing mission to provide you with exceptional heart care, we have created designated Provider Care Teams. These Care Teams include your primary Cardiologist (physician) and Advanced Practice Providers (APPs- Physician Assistants and Nurse Practitioners) who all work together to provide you with the care you need, when you need it.   You may see any of the following providers on your designated Care Team at your next follow up: Dr Glori Bickers Dr Loralie Champagne Dr Patrice Paradise, NP Lyda Jester, [place] Ginnie Smart Audry Riles, PharmD   Please be sure to bring in all your medications bottles to every appointment.

## 2021-01-24 ENCOUNTER — Encounter: Payer: Medicare Other | Attending: Physical Medicine and Rehabilitation | Admitting: Registered Nurse

## 2021-01-24 ENCOUNTER — Ambulatory Visit: Payer: Medicare Other | Admitting: Registered Nurse

## 2021-01-24 ENCOUNTER — Encounter: Payer: Self-pay | Admitting: Registered Nurse

## 2021-01-24 VITALS — BP 91/69 | HR 79 | Temp 98.0°F | Ht 73.0 in | Wt 283.6 lb

## 2021-01-24 DIAGNOSIS — S8412XS Injury of peroneal nerve at lower leg level, left leg, sequela: Secondary | ICD-10-CM | POA: Insufficient documentation

## 2021-01-24 DIAGNOSIS — M1732 Unilateral post-traumatic osteoarthritis, left knee: Secondary | ICD-10-CM | POA: Insufficient documentation

## 2021-01-24 DIAGNOSIS — Z79891 Long term (current) use of opiate analgesic: Secondary | ICD-10-CM | POA: Insufficient documentation

## 2021-01-24 DIAGNOSIS — M25572 Pain in left ankle and joints of left foot: Secondary | ICD-10-CM | POA: Diagnosis present

## 2021-01-24 DIAGNOSIS — G8929 Other chronic pain: Secondary | ICD-10-CM | POA: Diagnosis present

## 2021-01-24 DIAGNOSIS — Z5181 Encounter for therapeutic drug level monitoring: Secondary | ICD-10-CM | POA: Insufficient documentation

## 2021-01-24 DIAGNOSIS — G894 Chronic pain syndrome: Secondary | ICD-10-CM | POA: Insufficient documentation

## 2021-01-24 DIAGNOSIS — F418 Other specified anxiety disorders: Secondary | ICD-10-CM | POA: Insufficient documentation

## 2021-01-24 DIAGNOSIS — M25562 Pain in left knee: Secondary | ICD-10-CM | POA: Diagnosis present

## 2021-01-24 MED ORDER — PREGABALIN 150 MG PO CAPS: ORAL_CAPSULE | ORAL | 3 refills | Status: DC

## 2021-01-24 MED ORDER — ALPRAZOLAM 0.5 MG PO TABS: 0.5000 mg | ORAL_TABLET | Freq: Every day | ORAL | 2 refills | Status: DC | PRN

## 2021-01-24 MED ORDER — OXYCODONE HCL 15 MG PO TABS: 15.0000 mg | ORAL_TABLET | Freq: Three times a day (TID) | ORAL | 0 refills | Status: DC | PRN
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
## 2021-01-25 ENCOUNTER — Inpatient Hospital Stay: Payer: Medicare Other | Attending: Hematology and Oncology

## 2021-01-25 ENCOUNTER — Other Ambulatory Visit: Payer: Self-pay

## 2021-01-25 ENCOUNTER — Inpatient Hospital Stay: Payer: Medicare Other

## 2021-01-25 VITALS — BP 119/76 | HR 77 | Temp 97.9°F | Resp 18

## 2021-01-25 DIAGNOSIS — D751 Secondary polycythemia: Secondary | ICD-10-CM

## 2021-01-25 LAB — CBC WITH DIFFERENTIAL/PLATELET
Abs Immature Granulocytes: 0.02 10*3/uL (ref 0.00–0.07)
Basophils Absolute: 0.1 10*3/uL (ref 0.0–0.1)
Basophils Relative: 1 %
Eosinophils Absolute: 0.2 10*3/uL (ref 0.0–0.5)
Eosinophils Relative: 5 %
HCT: 56.3 % — ABNORMAL HIGH (ref 39.0–52.0)
Hemoglobin: 18.9 g/dL — ABNORMAL HIGH (ref 13.0–17.0)
Immature Granulocytes: 0 %
Lymphocytes Relative: 28 %
Lymphs Abs: 1.4 10*3/uL (ref 0.7–4.0)
MCH: 30.7 pg (ref 26.0–34.0)
MCHC: 33.6 g/dL (ref 30.0–36.0)
MCV: 91.4 fL (ref 80.0–100.0)
Monocytes Absolute: 0.6 10*3/uL (ref 0.1–1.0)
Monocytes Relative: 13 %
Neutro Abs: 2.7 10*3/uL (ref 1.7–7.7)
Neutrophils Relative %: 53 %
Platelets: 190 10*3/uL (ref 150–400)
RBC: 6.16 MIL/uL — ABNORMAL HIGH (ref 4.22–5.81)
RDW: 13.1 % (ref 11.5–15.5)
WBC: 5.1 10*3/uL (ref 4.0–10.5)
nRBC: 0 % (ref 0.0–0.2)

## 2021-01-25 NOTE — Patient Instructions (Signed)
Therapeutic Phlebotomy Therapeutic phlebotomy is the planned removal of blood from a person's body for the purpose of treating a medical condition. The procedure is similar to donating blood. Usually, about a pint (470 mL, or 0.47 L) of blood is removed. The average adult has 9-12 pints (4.3-5.7 L) of blood in the body. Therapeutic phlebotomy may be used to treat the following medical conditions: Hemochromatosis. This is a condition in which the blood contains too much iron. Polycythemia vera. This is a condition in which the blood contains too many red blood cells. Porphyria cutanea tarda. This is a disease in which an important part of hemoglobin is not made properly. It results in the buildup of abnormal amounts of porphyrins in the body. Sickle cell disease. This is a condition in which the red blood cells form an abnormal crescent shape rather than a round shape. Tell a health care provider about: Any allergies you have. All medicines you are taking, including vitamins, herbs, eye drops, creams, and over-the-counter medicines. Any problems you or family members have had with anesthetic medicines. Any blood disorders you have. Any surgeries you have had. Any medical conditions you have. Whether you are pregnant or may be pregnant. What are the risks? Generally, this is a safe procedure. However, problems may occur, including: Nausea or light-headedness. Low blood pressure (hypotension). Soreness, bleeding, swelling, or bruising at the needle insertion site. Infection. What happens before the procedure? Follow instructions from your health care provider about eating or drinking restrictions. Ask your health care provider about: Changing or stopping your regular medicines. This is especially important if you are taking diabetes medicines or blood thinners (anticoagulants). Taking medicines such as aspirin and ibuprofen. These medicines can thin your blood. Do not take these medicines  unless your health care provider tells you to take them. Taking over-the-counter medicines, vitamins, herbs, and supplements. Wear clothing with sleeves that can be raised above the elbow. Plan to have someone take you home from the hospital or clinic. You may have a blood sample taken. Your blood pressure, pulse rate, and breathing rate will be measured. What happens during the procedure?  To lower your risk of infection: Your health care team will wash or sanitize their hands. Your skin will be cleaned with an antiseptic. You may be given a medicine to numb the area (local anesthetic). A tourniquet will be placed on your arm. A needle will be inserted into one of your veins. Tubing and a collection bag will be attached to that needle. Blood will flow through the needle and tubing into the collection bag. The collection bag will be placed lower than your arm to allow gravity to help the flow of blood into the bag. You may be asked to open and close your hand slowly and continually during the entire collection. After the specified amount of blood has been removed from your body, the collection bag and tubing will be clamped. The needle will be removed from your vein. Pressure will be held on the site of the needle insertion to stop the bleeding. A bandage (dressing) will be placed over the needle insertion site. The procedure may vary among health care providers and hospitals. What happens after the procedure? Your blood pressure, pulse rate, and breathing rate will be measured after the procedure. You will be encouraged to drink fluids. Your recovery will be assessed and monitored. You can return to your normal activities as told by your health care provider. Summary Therapeutic phlebotomy is the planned removal   of blood from a person's body for the purpose of treating a medical condition. Therapeutic phlebotomy may be used to treat hemochromatosis, polycythemia vera, porphyria cutanea  tarda, or sickle cell disease. In the procedure, a needle is inserted and about a pint (470 mL, or 0.47 L) of blood is removed. The average adult has 9-12 pints (4.3-5.7 L) of blood in the body. This is generally a safe procedure, but it can sometimes cause problems such as nausea, light-headedness, or low blood pressure (hypotension). This information is not intended to replace advice given to you by your health care provider. Make sure you discuss any questions you have with your health care provider. Document Revised: 08/02/2020 Document Reviewed: 05/09/2017 Elsevier Patient Education  2022 Elsevier Inc.  

## 2021-01-25 NOTE — Progress Notes (Signed)
Pt offered food & beverages post phlebotomy.  Pt tolerated well.  Pt agreed to be observed for 38mins post phlebotomy.

## 2021-02-07 ENCOUNTER — Telehealth (HOSPITAL_COMMUNITY): Payer: Self-pay | Admitting: *Deleted

## 2021-02-07 NOTE — Telephone Encounter (Signed)
Pt left VM requesting cardiac clearance for colonoscopy. If pt is cleared our office will need to fax clearance letter to [facility] at [phone].  Routed to [place] for advice

## 2021-02-08 ENCOUNTER — Encounter (HOSPITAL_COMMUNITY): Payer: Self-pay | Admitting: *Deleted

## 2021-02-08 NOTE — Telephone Encounter (Signed)
Pt aware and letter faxed to [facility].

## 2021-02-23 ENCOUNTER — Inpatient Hospital Stay: Payer: Medicare Other

## 2021-02-23 ENCOUNTER — Inpatient Hospital Stay: Payer: Medicare Other | Attending: Hematology and Oncology | Admitting: Hematology and Oncology

## 2021-02-23 ENCOUNTER — Encounter: Payer: Self-pay | Admitting: Hematology and Oncology

## 2021-02-23 ENCOUNTER — Other Ambulatory Visit: Payer: Self-pay | Admitting: *Deleted

## 2021-02-23 ENCOUNTER — Other Ambulatory Visit: Payer: Self-pay

## 2021-02-23 VITALS — BP 95/78 | HR 92 | Temp 97.6°F | Resp 20 | Ht 73.0 in | Wt 287.1 lb

## 2021-02-23 DIAGNOSIS — D751 Secondary polycythemia: Secondary | ICD-10-CM

## 2021-02-23 LAB — CBC WITH DIFFERENTIAL/PLATELET
Abs Immature Granulocytes: 0.02 10*3/uL (ref 0.00–0.07)
Basophils Absolute: 0.1 10*3/uL (ref 0.0–0.1)
Basophils Relative: 1 %
Eosinophils Absolute: 0.2 10*3/uL (ref 0.0–0.5)
Eosinophils Relative: 4 %
HCT: 50.3 % (ref 39.0–52.0)
Hemoglobin: 17.1 g/dL — ABNORMAL HIGH (ref 13.0–17.0)
Immature Granulocytes: 0 %
Lymphocytes Relative: 29 %
Lymphs Abs: 1.4 10*3/uL (ref 0.7–4.0)
MCH: 30.3 pg (ref 26.0–34.0)
MCHC: 34 g/dL (ref 30.0–36.0)
MCV: 89 fL (ref 80.0–100.0)
Monocytes Absolute: 0.8 10*3/uL (ref 0.1–1.0)
Monocytes Relative: 16 %
Neutro Abs: 2.4 10*3/uL (ref 1.7–7.7)
Neutrophils Relative %: 50 %
Platelets: 240 10*3/uL (ref 150–400)
RBC: 5.65 MIL/uL (ref 4.22–5.81)
RDW: 13.2 % (ref 11.5–15.5)
WBC: 4.9 10*3/uL (ref 4.0–10.5)
nRBC: 0 % (ref 0.0–0.2)

## 2021-02-23 MED ORDER — CARVEDILOL 25 MG PO TABS: 25.0000 mg | ORAL_TABLET | Freq: Two times a day (BID) | ORAL | 0 refills | Status: DC
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
## 2021-03-06 ENCOUNTER — Telehealth (HOSPITAL_COMMUNITY): Payer: Medicare Other | Admitting: Vascular Surgery

## 2021-03-06 NOTE — Telephone Encounter (Signed)
Pt called he is having SOB, He would like to be seen . Please advise

## 2021-03-06 NOTE — Telephone Encounter (Signed)
Called and spoke with pt. Pt c/o shortness of breath x1week.  Pt denies swelling or fatigue but says his weight is up 12 lbs in about 3 weeks. Pt admits to eating out a lot and drinking excessive amounts of water. Pt said he is thirsty all of the time. Pt takes lasix  80mg  in the AM and 40mg  in the PM. Pt took 80mg  bid once last week and it helped but has since been taking normal dose. Pt requests an in office visit. Appt scheduled for Wednesday. Advised pt to limit sodium in diet and keep daily fluid intake less than 2L.

## 2021-03-08 ENCOUNTER — Encounter (HOSPITAL_COMMUNITY): Payer: Self-pay | Admitting: Internal Medicine

## 2021-03-08 ENCOUNTER — Ambulatory Visit (HOSPITAL_COMMUNITY)
Admission: RE | Admit: 2021-03-08 | Discharge: 2021-03-08 | Disposition: A | Discharge status: Home / Self Care | Payer: Medicare Other | Source: Ambulatory Visit | Attending: Internal Medicine | Admitting: Internal Medicine

## 2021-03-08 ENCOUNTER — Other Ambulatory Visit: Payer: Self-pay

## 2021-03-08 VITALS — BP 112/76 | HR 95 | Wt 287.0 lb

## 2021-03-08 DIAGNOSIS — Z7982 Long term (current) use of aspirin: Secondary | ICD-10-CM | POA: Diagnosis not present

## 2021-03-08 DIAGNOSIS — I10 Essential (primary) hypertension: Secondary | ICD-10-CM | POA: Diagnosis not present

## 2021-03-08 DIAGNOSIS — I340 Nonrheumatic mitral (valve) insufficiency: Secondary | ICD-10-CM | POA: Diagnosis not present

## 2021-03-08 DIAGNOSIS — D751 Secondary polycythemia: Secondary | ICD-10-CM | POA: Insufficient documentation

## 2021-03-08 DIAGNOSIS — Z6837 Body mass index (BMI) 37.0-37.9, adult: Secondary | ICD-10-CM | POA: Insufficient documentation

## 2021-03-08 DIAGNOSIS — I5022 Chronic systolic (congestive) heart failure: Secondary | ICD-10-CM | POA: Diagnosis not present

## 2021-03-08 DIAGNOSIS — G4733 Obstructive sleep apnea (adult) (pediatric): Secondary | ICD-10-CM | POA: Insufficient documentation

## 2021-03-08 DIAGNOSIS — Z91119 Patient's noncompliance with dietary regimen due to unspecified reason: Secondary | ICD-10-CM | POA: Diagnosis not present

## 2021-03-08 DIAGNOSIS — Z79899 Other long term (current) drug therapy: Secondary | ICD-10-CM | POA: Diagnosis not present

## 2021-03-08 DIAGNOSIS — Z7951 Long term (current) use of inhaled steroids: Secondary | ICD-10-CM | POA: Diagnosis not present

## 2021-03-08 DIAGNOSIS — E669 Obesity, unspecified: Secondary | ICD-10-CM | POA: Insufficient documentation

## 2021-03-08 DIAGNOSIS — Z7984 Long term (current) use of oral hypoglycemic drugs: Secondary | ICD-10-CM | POA: Insufficient documentation

## 2021-03-08 DIAGNOSIS — I110 Hypertensive heart disease with heart failure: Secondary | ICD-10-CM | POA: Insufficient documentation

## 2021-03-08 LAB — CBC
HCT: 50.8 % (ref 39.0–52.0)
Hemoglobin: 17 g/dL (ref 13.0–17.0)
MCH: 30.5 pg (ref 26.0–34.0)
MCHC: 33.5 g/dL (ref 30.0–36.0)
MCV: 91 fL (ref 80.0–100.0)
Platelets: 209 10*3/uL (ref 150–400)
RBC: 5.58 MIL/uL (ref 4.22–5.81)
RDW: 14.2 % (ref 11.5–15.5)
WBC: 6.2 10*3/uL (ref 4.0–10.5)
nRBC: 0 % (ref 0.0–0.2)

## 2021-03-08 LAB — BRAIN NATRIURETIC PEPTIDE: B Natriuretic Peptide: 390 pg/mL — ABNORMAL HIGH (ref 0.0–100.0)

## 2021-03-08 LAB — BASIC METABOLIC PANEL
Anion gap: 10 (ref 5–15)
BUN: 15 mg/dL (ref 6–20)
CO2: 25 mmol/L (ref 22–32)
Calcium: 8.8 mg/dL — ABNORMAL LOW (ref 8.9–10.3)
Chloride: 105 mmol/L (ref 98–111)
Creatinine, Ser: 1.33 mg/dL — ABNORMAL HIGH (ref 0.61–1.24)
GFR, Estimated: >60 mL/min (ref >60)
Glucose, Bld: 130 mg/dL — ABNORMAL HIGH (ref 70–99)
Potassium: 3.3 mmol/L — ABNORMAL LOW (ref 3.5–5.1)
Sodium: 140 mmol/L (ref 135–145)

## 2021-03-08 MED ORDER — POTASSIUM CHLORIDE CRYS ER 20 MEQ PO TBCR: 40.0000 meq | EXTENDED_RELEASE_TABLET | ORAL | 3 refills | Status: DC

## 2021-03-08 MED ORDER — METOLAZONE 2.5 MG PO TABS
2.5000 mg | ORAL_TABLET | ORAL | 3 refills | Status: DC
Start: 2021-03-08 — End: 2021-04-29

## 2021-03-08 NOTE — Addendum Note (Signed)
Encounter addended by: Scarlette Calico, RN on: 03/08/2021 4:13 PM  Actions taken: Diagnosis association updated, Order list changed, Clinical Note Signed, Charge Capture section accepted
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
## 2021-03-08 NOTE — Progress Notes (Signed)
ReDS Vest / Clip - 03/08/21 1500       ReDS Vest / Clip   Station Marker D    Ruler Value 35.5    ReDS Value Range High volume overload    ReDS Actual Value 52

## 2021-03-08 NOTE — Patient Instructions (Signed)
Take Metolazone 2.5 mg AS NEEDED, DO NOT TAKE MORE THAN TWICE A MONTH  When you take Metolazone take Potassium 40 meq (2 tabs)   Labs done today, your results will be available in MyChart, we will contact you for abnormal readings.  Your physician recommends that you schedule a follow-up appointment in: 6 weeks  Do the following things EVERYDAY: Weigh yourself in the morning before breakfast. Write it down and keep it in a log. Take your medicines as prescribed Eat low salt foods--Limit salt (sodium) to 2000 mg per day.  Stay as active as you can everyday Limit all fluids for the day to less than 2 liters  If you have any questions or concerns before your next appointment please send [place] a message through [place] or call our office at [phone].    TO LEAVE A MESSAGE FOR THE NURSE SELECT OPTION 2, PLEASE LEAVE A MESSAGE INCLUDING: YOUR NAME DATE OF BIRTH CALL BACK NUMBER REASON FOR CALL**this is important as we prioritize the call backs  YOU WILL RECEIVE A CALL BACK THE SAME DAY AS LONG AS YOU CALL BEFORE 4:00 PM  At the [facility], you and your health needs are our priority. As part of our continuing mission to provide you with exceptional heart care, we have created designated Provider Care Teams. These Care Teams include your primary Cardiologist (physician) and Advanced Practice Providers (APPs- Physician Assistants and Nurse Practitioners) who all work together to provide you with the care you need, when you need it.   You may see any of the following providers on your designated Care Team at your next follow up: Dr Glori Bickers Dr Haynes Kerns, NP Lyda Jester, [place] [facility] [place], [place] Audry Riles, PharmD   Please be sure to bring in all your medications bottles to every appointment.

## 2021-03-09 ENCOUNTER — Telehealth (HOSPITAL_COMMUNITY): Payer: Self-pay | Admitting: *Deleted

## 2021-03-09 DIAGNOSIS — I5022 Chronic systolic (congestive) heart failure: Secondary | ICD-10-CM

## 2021-03-09 MED ORDER — POTASSIUM CHLORIDE CRYS ER 20 MEQ PO TBCR: 20.0000 meq | EXTENDED_RELEASE_TABLET | Freq: Every day | ORAL | 6 refills | Status: DC

## 2021-03-09 NOTE — Telephone Encounter (Signed)
-----   Message from Jolaine Artist, MD sent at 03/08/2021  6:37 PM EDT ----- Have him take 60 kcl tomorrow then start 20 daily. Repeat bmet 1 week

## 2021-03-13 ENCOUNTER — Other Ambulatory Visit (HOSPITAL_COMMUNITY): Payer: Self-pay | Admitting: *Deleted

## 2021-03-13 MED ORDER — FUROSEMIDE 40 MG PO TABS: ORAL_TABLET | ORAL | 3 refills | Status: DC

## 2021-03-17 ENCOUNTER — Ambulatory Visit (HOSPITAL_COMMUNITY)
Admission: RE | Admit: 2021-03-17 | Discharge: 2021-03-17 | Disposition: A | Discharge status: Home / Self Care | Payer: Medicare Other | Source: Ambulatory Visit | Attending: Cardiology | Admitting: Cardiology

## 2021-03-17 ENCOUNTER — Other Ambulatory Visit: Payer: Self-pay

## 2021-03-17 DIAGNOSIS — I5022 Chronic systolic (congestive) heart failure: Secondary | ICD-10-CM | POA: Diagnosis present

## 2021-03-17 LAB — BASIC METABOLIC PANEL
Anion gap: 9 (ref 5–15)
BUN: 28 mg/dL — ABNORMAL HIGH (ref 6–20)
CO2: 23 mmol/L (ref 22–32)
Calcium: 9.1 mg/dL (ref 8.9–10.3)
Chloride: 104 mmol/L (ref 98–111)
Creatinine, Ser: 1.51 mg/dL — ABNORMAL HIGH (ref 0.61–1.24)
GFR, Estimated: 53 mL/min — ABNORMAL LOW (ref >60)
Glucose, Bld: 158 mg/dL — ABNORMAL HIGH (ref 70–99)
Potassium: 4 mmol/L (ref 3.5–5.1)
Sodium: 136 mmol/L (ref 135–145)

## 2021-03-19 ENCOUNTER — Other Ambulatory Visit: Payer: Self-pay | Admitting: Internal Medicine

## 2021-03-22 ENCOUNTER — Encounter (HOSPITAL_COMMUNITY): Payer: Self-pay

## 2021-03-23 ENCOUNTER — Inpatient Hospital Stay: Payer: Medicare Other

## 2021-03-23 ENCOUNTER — Other Ambulatory Visit: Payer: Self-pay

## 2021-03-23 ENCOUNTER — Inpatient Hospital Stay: Payer: Medicare Other | Attending: Hematology and Oncology

## 2021-03-23 DIAGNOSIS — D751 Secondary polycythemia: Secondary | ICD-10-CM | POA: Diagnosis not present

## 2021-03-23 LAB — CBC WITH DIFFERENTIAL/PLATELET
Abs Immature Granulocytes: 0.02 10*3/uL (ref 0.00–0.07)
Basophils Absolute: 0.1 10*3/uL (ref 0.0–0.1)
Basophils Relative: 1 %
Eosinophils Absolute: 0.2 10*3/uL (ref 0.0–0.5)
Eosinophils Relative: 4 %
HCT: 56 % — ABNORMAL HIGH (ref 39.0–52.0)
Hemoglobin: 18.4 g/dL — ABNORMAL HIGH (ref 13.0–17.0)
Immature Granulocytes: 0 %
Lymphocytes Relative: 37 %
Lymphs Abs: 2.4 10*3/uL (ref 0.7–4.0)
MCH: 30 pg (ref 26.0–34.0)
MCHC: 32.9 g/dL (ref 30.0–36.0)
MCV: 91.4 fL (ref 80.0–100.0)
Monocytes Absolute: 0.7 10*3/uL (ref 0.1–1.0)
Monocytes Relative: 10 %
Neutro Abs: 3.1 10*3/uL (ref 1.7–7.7)
Neutrophils Relative %: 48 %
Platelets: 202 10*3/uL (ref 150–400)
RBC: 6.13 MIL/uL — ABNORMAL HIGH (ref 4.22–5.81)
RDW: 14 % (ref 11.5–15.5)
WBC: 6.5 10*3/uL (ref 4.0–10.5)
nRBC: 0 % (ref 0.0–0.2)

## 2021-03-23 NOTE — Progress Notes (Signed)
Patient presented to the office today with hypotension, 93/57;  patient stated" I took my blood pressure medication today." [facility], patient was seen in fusion this morning. Patient was advised that phlebotmy will need to be held today due to hypotension.  Patient was also advised by Verline Lema that he needs to hold BP medicaion and to eat and drink prior to each phlebotomy, patient states that he is also SOB x2 weeks and was seen by cardiologist  on 03/08/2021. Patient stated that" I am headed to [place] for one month totake care of my mother," advised patient that his next treatment is on 04/20/2021 and to follow up if any further issues of concern.  Patient verbalzied understanding. Patient was discharged in stable condition.

## 2021-03-24 ENCOUNTER — Other Ambulatory Visit: Payer: Self-pay | Admitting: Registered Nurse

## 2021-03-24 ENCOUNTER — Encounter: Payer: Medicare Other | Attending: Physical Medicine and Rehabilitation | Admitting: Registered Nurse

## 2021-03-24 ENCOUNTER — Other Ambulatory Visit: Payer: Self-pay

## 2021-03-24 ENCOUNTER — Ambulatory Visit: Payer: Medicare Other | Admitting: Registered Nurse

## 2021-03-24 VITALS — BP 111/77 | HR 98 | Ht 73.0 in | Wt 286.0 lb

## 2021-03-24 DIAGNOSIS — Z5181 Encounter for therapeutic drug level monitoring: Secondary | ICD-10-CM | POA: Diagnosis not present

## 2021-03-24 DIAGNOSIS — G894 Chronic pain syndrome: Secondary | ICD-10-CM

## 2021-03-24 DIAGNOSIS — Z79891 Long term (current) use of opiate analgesic: Secondary | ICD-10-CM

## 2021-03-24 DIAGNOSIS — F418 Other specified anxiety disorders: Secondary | ICD-10-CM | POA: Diagnosis present

## 2021-03-24 DIAGNOSIS — M1732 Unilateral post-traumatic osteoarthritis, left knee: Secondary | ICD-10-CM | POA: Diagnosis present

## 2021-03-24 DIAGNOSIS — S8412XS Injury of peroneal nerve at lower leg level, left leg, sequela: Secondary | ICD-10-CM

## 2021-03-24 MED ORDER — ALPRAZOLAM 0.5 MG PO TABS: 0.5000 mg | ORAL_TABLET | Freq: Every day | ORAL | 2 refills | Status: DC | PRN

## 2021-03-24 MED ORDER — OXYCODONE HCL 15 MG PO TABS: 15.0000 mg | ORAL_TABLET | Freq: Three times a day (TID) | ORAL | 0 refills | Status: DC | PRN
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
## 2021-03-27 ENCOUNTER — Encounter: Payer: Self-pay | Admitting: Registered Nurse

## 2021-04-04 LAB — TOXASSURE SELECT,+ANTIDEPR,UR

## 2021-04-05 ENCOUNTER — Telehealth: Payer: Self-pay | Admitting: *Deleted

## 2021-04-05 NOTE — Telephone Encounter (Signed)
Urine drug screen for this encounter is consistent for prescribed medication. Alprazolam not present but there is no mention of when it was taken last.

## 2021-04-06 ENCOUNTER — Encounter (HOSPITAL_COMMUNITY): Payer: Self-pay | Admitting: Internal Medicine

## 2021-04-12 ENCOUNTER — Other Ambulatory Visit: Payer: Self-pay | Admitting: Internal Medicine
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
## 2021-04-19 ENCOUNTER — Ambulatory Visit (HOSPITAL_COMMUNITY)
Admission: RE | Admit: 2021-04-19 | Discharge: 2021-04-19 | Disposition: A | Discharge status: Home / Self Care | Payer: Medicare Other | Source: Ambulatory Visit | Attending: Family Medicine | Admitting: Family Medicine

## 2021-04-19 ENCOUNTER — Other Ambulatory Visit: Payer: Self-pay

## 2021-04-19 ENCOUNTER — Telehealth: Payer: Self-pay

## 2021-04-19 ENCOUNTER — Emergency Department: Payer: Self-pay

## 2021-04-19 ENCOUNTER — Inpatient Hospital Stay (HOSPITAL_COMMUNITY)
Admission: EM | Admit: 2021-04-19 | Discharge: 2021-04-29 | DRG: 215 | Disposition: A | Discharge status: Other Acute Inpatient Hospital | Payer: Medicare Other | Source: Ambulatory Visit | Attending: Internal Medicine | Admitting: Internal Medicine

## 2021-04-19 ENCOUNTER — Emergency Department (HOSPITAL_COMMUNITY): Payer: Medicare Other

## 2021-04-19 ENCOUNTER — Encounter (HOSPITAL_COMMUNITY): Payer: Self-pay

## 2021-04-19 VITALS — BP 82/56 | HR 100 | Wt 277.8 lb

## 2021-04-19 DIAGNOSIS — I480 Paroxysmal atrial fibrillation: Principal | ICD-10-CM | POA: Diagnosis present

## 2021-04-19 DIAGNOSIS — Z79899 Other long term (current) drug therapy: Secondary | ICD-10-CM | POA: Insufficient documentation

## 2021-04-19 DIAGNOSIS — I5082 Biventricular heart failure: Secondary | ICD-10-CM | POA: Diagnosis not present

## 2021-04-19 DIAGNOSIS — Y838 Other surgical procedures as the cause of abnormal reaction of the patient, or of later complication, without mention of misadventure at the time of the procedure: Secondary | ICD-10-CM | POA: Diagnosis not present

## 2021-04-19 DIAGNOSIS — Z01810 Encounter for preprocedural cardiovascular examination: Secondary | ICD-10-CM | POA: Diagnosis not present

## 2021-04-19 DIAGNOSIS — M25511 Pain in right shoulder: Secondary | ICD-10-CM | POA: Diagnosis not present

## 2021-04-19 DIAGNOSIS — J45909 Unspecified asthma, uncomplicated: Secondary | ICD-10-CM | POA: Diagnosis present

## 2021-04-19 DIAGNOSIS — I4891 Unspecified atrial fibrillation: Secondary | ICD-10-CM

## 2021-04-19 DIAGNOSIS — I110 Hypertensive heart disease with heart failure: Secondary | ICD-10-CM | POA: Insufficient documentation

## 2021-04-19 DIAGNOSIS — G629 Polyneuropathy, unspecified: Secondary | ICD-10-CM | POA: Diagnosis present

## 2021-04-19 DIAGNOSIS — I428 Other cardiomyopathies: Secondary | ICD-10-CM | POA: Diagnosis present

## 2021-04-19 DIAGNOSIS — I472 Ventricular tachycardia, unspecified: Secondary | ICD-10-CM | POA: Diagnosis present

## 2021-04-19 DIAGNOSIS — D751 Secondary polycythemia: Secondary | ICD-10-CM | POA: Insufficient documentation

## 2021-04-19 DIAGNOSIS — Z01818 Encounter for other preprocedural examination: Secondary | ICD-10-CM

## 2021-04-19 DIAGNOSIS — Z452 Encounter for adjustment and management of vascular access device: Secondary | ICD-10-CM

## 2021-04-19 DIAGNOSIS — M62838 Other muscle spasm: Secondary | ICD-10-CM | POA: Diagnosis present

## 2021-04-19 DIAGNOSIS — I97638 Postprocedural hematoma of a circulatory system organ or structure following other circulatory system procedure: Secondary | ICD-10-CM | POA: Diagnosis not present

## 2021-04-19 DIAGNOSIS — I5023 Acute on chronic systolic (congestive) heart failure: Secondary | ICD-10-CM | POA: Insufficient documentation

## 2021-04-19 DIAGNOSIS — R509 Fever, unspecified: Secondary | ICD-10-CM

## 2021-04-19 DIAGNOSIS — I50811 Acute right heart failure: Secondary | ICD-10-CM

## 2021-04-19 DIAGNOSIS — E871 Hypo-osmolality and hyponatremia: Secondary | ICD-10-CM | POA: Diagnosis present

## 2021-04-19 DIAGNOSIS — Z6836 Body mass index (BMI) 36.0-36.9, adult: Secondary | ICD-10-CM | POA: Diagnosis not present

## 2021-04-19 DIAGNOSIS — F419 Anxiety disorder, unspecified: Secondary | ICD-10-CM | POA: Diagnosis present

## 2021-04-19 DIAGNOSIS — G4733 Obstructive sleep apnea (adult) (pediatric): Secondary | ICD-10-CM | POA: Diagnosis present

## 2021-04-19 DIAGNOSIS — N170 Acute kidney failure with tubular necrosis: Secondary | ICD-10-CM | POA: Diagnosis not present

## 2021-04-19 DIAGNOSIS — J95821 Acute postprocedural respiratory failure: Secondary | ICD-10-CM | POA: Diagnosis not present

## 2021-04-19 DIAGNOSIS — R570 Cardiogenic shock: Secondary | ICD-10-CM | POA: Diagnosis present

## 2021-04-19 DIAGNOSIS — E785 Hyperlipidemia, unspecified: Secondary | ICD-10-CM | POA: Diagnosis present

## 2021-04-19 DIAGNOSIS — Z515 Encounter for palliative care: Secondary | ICD-10-CM | POA: Diagnosis not present

## 2021-04-19 DIAGNOSIS — R739 Hyperglycemia, unspecified: Secondary | ICD-10-CM | POA: Diagnosis not present

## 2021-04-19 DIAGNOSIS — Z841 Family history of disorders of kidney and ureter: Secondary | ICD-10-CM

## 2021-04-19 DIAGNOSIS — I447 Left bundle-branch block, unspecified: Secondary | ICD-10-CM | POA: Diagnosis present

## 2021-04-19 DIAGNOSIS — Z7189 Other specified counseling: Secondary | ICD-10-CM | POA: Diagnosis not present

## 2021-04-19 DIAGNOSIS — J9601 Acute respiratory failure with hypoxia: Secondary | ICD-10-CM | POA: Diagnosis not present

## 2021-04-19 DIAGNOSIS — Z9289 Personal history of other medical treatment: Secondary | ICD-10-CM

## 2021-04-19 DIAGNOSIS — E872 Acidosis, unspecified: Secondary | ICD-10-CM | POA: Diagnosis present

## 2021-04-19 DIAGNOSIS — I340 Nonrheumatic mitral (valve) insufficiency: Secondary | ICD-10-CM

## 2021-04-19 DIAGNOSIS — E876 Hypokalemia: Secondary | ICD-10-CM | POA: Diagnosis present

## 2021-04-19 DIAGNOSIS — R031 Nonspecific low blood-pressure reading: Secondary | ICD-10-CM | POA: Insufficient documentation

## 2021-04-19 DIAGNOSIS — R0683 Snoring: Secondary | ICD-10-CM

## 2021-04-19 DIAGNOSIS — Z959 Presence of cardiac and vascular implant and graft, unspecified: Secondary | ICD-10-CM | POA: Insufficient documentation

## 2021-04-19 DIAGNOSIS — Z20822 Contact with and (suspected) exposure to covid-19: Secondary | ICD-10-CM | POA: Diagnosis not present

## 2021-04-19 DIAGNOSIS — Z7984 Long term (current) use of oral hypoglycemic drugs: Secondary | ICD-10-CM | POA: Insufficient documentation

## 2021-04-19 DIAGNOSIS — I10 Essential (primary) hypertension: Secondary | ICD-10-CM

## 2021-04-19 DIAGNOSIS — Z4659 Encounter for fitting and adjustment of other gastrointestinal appliance and device: Secondary | ICD-10-CM

## 2021-04-19 DIAGNOSIS — Z419 Encounter for procedure for purposes other than remedying health state, unspecified: Secondary | ICD-10-CM

## 2021-04-19 DIAGNOSIS — Z7951 Long term (current) use of inhaled steroids: Secondary | ICD-10-CM

## 2021-04-19 DIAGNOSIS — Z7982 Long term (current) use of aspirin: Secondary | ICD-10-CM

## 2021-04-19 DIAGNOSIS — I509 Heart failure, unspecified: Secondary | ICD-10-CM | POA: Diagnosis not present

## 2021-04-19 DIAGNOSIS — E669 Obesity, unspecified: Secondary | ICD-10-CM | POA: Insufficient documentation

## 2021-04-19 DIAGNOSIS — R579 Shock, unspecified: Secondary | ICD-10-CM

## 2021-04-19 DIAGNOSIS — R059 Cough, unspecified: Secondary | ICD-10-CM | POA: Diagnosis not present

## 2021-04-19 LAB — COOXEMETRY PANEL
Carboxyhemoglobin: 0.9 % (ref 0.5–1.5)
Carboxyhemoglobin: 1 % (ref 0.5–1.5)
Methemoglobin: 0.6 % (ref 0.0–1.5)
Methemoglobin: 0.6 % (ref 0.0–1.5)
O2 Saturation: 37.7 %
O2 Saturation: 58.6 %
Total hemoglobin: 19.4 g/dL — ABNORMAL HIGH (ref 12.0–16.0)
Total hemoglobin: 16.7 g/dL — ABNORMAL HIGH (ref 12.0–16.0)

## 2021-04-19 LAB — BASIC METABOLIC PANEL
Anion gap: 12 (ref 5–15)
BUN: 26 mg/dL — ABNORMAL HIGH (ref 6–20)
CO2: 19 mmol/L — ABNORMAL LOW (ref 22–32)
Calcium: 9.2 mg/dL (ref 8.9–10.3)
Chloride: 103 mmol/L (ref 98–111)
Creatinine, Ser: 1.7 mg/dL — ABNORMAL HIGH (ref 0.61–1.24)
GFR, Estimated: 46 mL/min — ABNORMAL LOW (ref >60)
Glucose, Bld: 132 mg/dL — ABNORMAL HIGH (ref 70–99)
Potassium: 4.5 mmol/L (ref 3.5–5.1)
Sodium: 134 mmol/L — ABNORMAL LOW (ref 135–145)

## 2021-04-19 LAB — CBC WITH DIFFERENTIAL/PLATELET
Abs Immature Granulocytes: 0.03 10*3/uL (ref 0.00–0.07)
Basophils Absolute: 0.1 10*3/uL (ref 0.0–0.1)
Basophils Relative: 2 %
Eosinophils Absolute: 0.2 10*3/uL (ref 0.0–0.5)
Eosinophils Relative: 4 %
HCT: 59.1 % — ABNORMAL HIGH (ref 39.0–52.0)
Hemoglobin: 19.2 g/dL — ABNORMAL HIGH (ref 13.0–17.0)
Immature Granulocytes: 1 %
Lymphocytes Relative: 25 %
Lymphs Abs: 1.6 10*3/uL (ref 0.7–4.0)
MCH: 29.7 pg (ref 26.0–34.0)
MCHC: 32.5 g/dL (ref 30.0–36.0)
MCV: 91.3 fL (ref 80.0–100.0)
Monocytes Absolute: 0.9 10*3/uL (ref 0.1–1.0)
Monocytes Relative: 14 %
Neutro Abs: 3.4 10*3/uL (ref 1.7–7.7)
Neutrophils Relative %: 54 %
Platelets: 201 10*3/uL (ref 150–400)
RBC: 6.47 MIL/uL — ABNORMAL HIGH (ref 4.22–5.81)
RDW: 14 % (ref 11.5–15.5)
WBC: 6.2 10*3/uL (ref 4.0–10.5)
nRBC: 0 % (ref 0.0–0.2)

## 2021-04-19 LAB — HEPATIC FUNCTION PANEL
ALT: 35 U/L (ref 0–44)
AST: 29 U/L (ref 15–41)
Albumin: 4.4 g/dL (ref 3.5–5.0)
Alkaline Phosphatase: 49 U/L (ref 38–126)
Bilirubin, Direct: 0.3 mg/dL — ABNORMAL HIGH (ref 0.0–0.2)
Indirect Bilirubin: 1.6 mg/dL — ABNORMAL HIGH (ref 0.3–0.9)
Total Bilirubin: 1.9 mg/dL — ABNORMAL HIGH (ref 0.3–1.2)
Total Protein: 8 g/dL (ref 6.5–8.1)

## 2021-04-19 LAB — HIV ANTIBODY (ROUTINE TESTING W REFLEX): HIV Screen 4th Generation wRfx: NONREACTIVE

## 2021-04-19 LAB — MAGNESIUM: Magnesium: 2.1 mg/dL (ref 1.7–2.4)

## 2021-04-19 LAB — LACTIC ACID, PLASMA
Lactic Acid, Venous: 1.8 mmol/L (ref 0.5–1.9)
Lactic Acid, Venous: 2 mmol/L (ref 0.5–1.9)
Lactic Acid, Venous: 1.4 mmol/L (ref 0.5–1.9)

## 2021-04-19 LAB — HEPARIN LEVEL (UNFRACTIONATED): Heparin Unfractionated: <0.1 IU/mL — ABNORMAL LOW (ref 0.30–0.70)

## 2021-04-19 LAB — MRSA NEXT GEN BY PCR, NASAL: MRSA by PCR Next Gen: NOT DETECTED

## 2021-04-19 LAB — D-DIMER, QUANTITATIVE: D-Dimer, Quant: 0.32 ug/mL-FEU (ref 0.00–0.50)

## 2021-04-19 LAB — TROPONIN I (HIGH SENSITIVITY)
Troponin I (High Sensitivity): 29 ng/L — ABNORMAL HIGH (ref <18)
Troponin I (High Sensitivity): 31 ng/L — ABNORMAL HIGH (ref <18)

## 2021-04-19 LAB — RESP PANEL BY RT-PCR (FLU A&B, COVID) ARPGX2
Influenza A by PCR: NEGATIVE
Influenza B by PCR: NEGATIVE
SARS Coronavirus 2 by RT PCR: NEGATIVE

## 2021-04-19 LAB — T4, FREE: Free T4: 0.89 ng/dL (ref 0.61–1.12)

## 2021-04-19 LAB — BRAIN NATRIURETIC PEPTIDE: B Natriuretic Peptide: 154.2 pg/mL — ABNORMAL HIGH (ref 0.0–100.0)

## 2021-04-19 LAB — TSH: TSH: 1.055 u[IU]/mL (ref 0.350–4.500)

## 2021-04-19 MED ORDER — AMIODARONE HCL IN DEXTROSE 360-4.14 MG/200ML-% IV SOLN: 60.0000 mg/h | INTRAVENOUS | Status: AC

## 2021-04-19 MED ORDER — SODIUM CHLORIDE 0.9 % IV SOLN: 250.0000 mL | INTRAVENOUS | Status: DC | PRN

## 2021-04-19 MED ORDER — HYDROMORPHONE HCL 1 MG/ML IJ SOLN
0.5000 mg | Freq: Once | INTRAMUSCULAR | Status: AC
  Administered 2021-04-19: 20:00:00 0.5 mg via INTRAVENOUS
  Filled 2021-04-19: qty 0.5

## 2021-04-19 MED ORDER — FUROSEMIDE 10 MG/ML IJ SOLN
80.0000 mg | Freq: Once | INTRAMUSCULAR | Status: AC
  Administered 2021-04-19: 18:00:00 80 mg via INTRAVENOUS
  Filled 2021-04-19: qty 8

## 2021-04-19 MED ORDER — SODIUM CHLORIDE 0.9% FLUSH: 3.0000 mL | INTRAVENOUS | Status: DC | PRN

## 2021-04-19 MED ORDER — ACETAMINOPHEN 325 MG PO TABS
650.0000 mg | ORAL_TABLET | ORAL | Status: DC | PRN
  Administered 2021-04-23: 22:00:00 650 mg via ORAL
  Filled 2021-04-19: qty 2

## 2021-04-19 MED ORDER — NOREPINEPHRINE 4 MG/250ML-% IV SOLN
2.0000 ug/min | INTRAVENOUS | Status: DC
  Administered 2021-04-19: 21:00:00 18 ug/min via INTRAVENOUS
  Administered 2021-04-20: 16 ug/min via INTRAVENOUS
  Administered 2021-04-20: 14 ug/min via INTRAVENOUS
  Filled 2021-04-19 (×3): qty 250

## 2021-04-19 MED ORDER — SODIUM CHLORIDE 0.9% FLUSH
10.0000 mL | Freq: Two times a day (BID) | INTRAVENOUS | Status: DC
  Administered 2021-04-20: 20 mL
  Administered 2021-04-21 – 2021-04-25 (×5): 10 mL
  Administered 2021-04-26: 22:00:00 20 mL
  Administered 2021-04-27 – 2021-04-28 (×4): 10 mL

## 2021-04-19 MED ORDER — PREGABALIN 150 MG PO CAPS: ORAL_CAPSULE | ORAL | 3 refills | Status: DC

## 2021-04-19 MED ORDER — FENTANYL CITRATE PF 50 MCG/ML IJ SOSY
100.0000 ug | PREFILLED_SYRINGE | Freq: Once | INTRAMUSCULAR | Status: AC
  Administered 2021-04-19: 18:00:00 100 ug via INTRAVENOUS
  Filled 2021-04-19: qty 2

## 2021-04-19 MED ORDER — PREGABALIN 75 MG PO CAPS
150.0000 mg | ORAL_CAPSULE | Freq: Three times a day (TID) | ORAL | Status: DC
  Administered 2021-04-19 – 2021-04-27 (×20): 150 mg via ORAL
  Filled 2021-04-19 (×20): qty 2

## 2021-04-19 MED ORDER — AMIODARONE HCL IN DEXTROSE 360-4.14 MG/200ML-% IV SOLN: 30.0000 mg/h | INTRAVENOUS | Status: DC

## 2021-04-19 MED ORDER — HEPARIN BOLUS VIA INFUSION
3200.0000 [IU] | Freq: Once | INTRAVENOUS | Status: AC
  Administered 2021-04-19: 3200 [IU] via INTRAVENOUS
  Filled 2021-04-19: qty 3200

## 2021-04-19 MED ORDER — HYDROMORPHONE HCL 1 MG/ML IJ SOLN
0.5000 mg | INTRAMUSCULAR | Status: AC | PRN
  Administered 2021-04-19 – 2021-04-28 (×4): 0.5 mg via INTRAVENOUS
  Filled 2021-04-19 (×4): qty 0.5

## 2021-04-19 MED ORDER — CHLORHEXIDINE GLUCONATE CLOTH 2 % EX PADS
6.0000 | MEDICATED_PAD | Freq: Every day | CUTANEOUS | Status: DC
  Administered 2021-04-19 – 2021-04-29 (×10): 6 via TOPICAL

## 2021-04-19 MED ORDER — AMIODARONE HCL IN DEXTROSE 360-4.14 MG/200ML-% IV SOLN
60.0000 mg/h | INTRAVENOUS | Status: DC
  Administered 2021-04-19: 15:00:00 60 mg/h via INTRAVENOUS
  Filled 2021-04-19 (×2): qty 200

## 2021-04-19 MED ORDER — HEPARIN (PORCINE) 25000 UT/250ML-% IV SOLN
1100.0000 [IU]/h | INTRAVENOUS | Status: DC
  Administered 2021-04-19: 15:00:00 1600 [IU]/h via INTRAVENOUS
  Administered 2021-04-20: 13:00:00 1800 [IU]/h via INTRAVENOUS
  Administered 2021-04-21 – 2021-04-24 (×7): 1750 [IU]/h via INTRAVENOUS
  Administered 2021-04-25 – 2021-04-26 (×3): 1850 [IU]/h via INTRAVENOUS
  Administered 2021-04-27: 03:00:00 1450 [IU]/h via INTRAVENOUS
  Filled 2021-04-19 (×13): qty 250

## 2021-04-19 MED ORDER — MILRINONE LACTATE IN DEXTROSE 20-5 MG/100ML-% IV SOLN
0.2500 ug/kg/min | INTRAVENOUS | Status: DC
  Administered 2021-04-19 (×2): 0.125 ug/kg/min via INTRAVENOUS
  Administered 2021-04-20 – 2021-04-21 (×3): 0.25 ug/kg/min via INTRAVENOUS
  Administered 2021-04-22: 0.125 ug/kg/min via INTRAVENOUS
  Administered 2021-04-23 – 2021-04-25 (×6): 0.25 ug/kg/min via INTRAVENOUS
  Administered 2021-04-26: 12:00:00 .25 ug/kg/min via INTRAVENOUS
  Administered 2021-04-26 – 2021-04-29 (×9): 0.25 ug/kg/min via INTRAVENOUS
  Filled 2021-04-19 (×13): qty 100
  Filled 2021-04-19: qty 200
  Filled 2021-04-19 (×5): qty 100

## 2021-04-19 MED ORDER — SODIUM CHLORIDE 0.9 % IV SOLN
250.0000 mL | INTRAVENOUS | Status: DC
  Administered 2021-04-25: 13:00:00 250 mL via INTRAVENOUS

## 2021-04-19 MED ORDER — SODIUM CHLORIDE 0.9% FLUSH: 10.0000 mL | INTRAVENOUS | Status: DC | PRN

## 2021-04-19 MED ORDER — HEPARIN BOLUS VIA INFUSION
6500.0000 [IU] | Freq: Once | INTRAVENOUS | Status: AC
  Administered 2021-04-19: 15:00:00 6500 [IU] via INTRAVENOUS
  Filled 2021-04-19: qty 6500

## 2021-04-19 MED ORDER — AMIODARONE LOAD VIA INFUSION
150.0000 mg | Freq: Once | INTRAVENOUS | Status: AC
  Administered 2021-04-19: 15:00:00 150 mg via INTRAVENOUS
  Filled 2021-04-19: qty 83.34

## 2021-04-19 MED ORDER — NOREPINEPHRINE 4 MG/250ML-% IV SOLN
0.0000 ug/min | INTRAVENOUS | Status: DC
  Administered 2021-04-19: 12:00:00 3 ug/min via INTRAVENOUS
  Filled 2021-04-19: qty 250

## 2021-04-19 MED ORDER — SODIUM CHLORIDE 0.9% FLUSH
3.0000 mL | Freq: Two times a day (BID) | INTRAVENOUS | Status: DC
  Administered 2021-04-20 – 2021-04-26 (×8): 3 mL via INTRAVENOUS
  Administered 2021-04-27: 10:00:00 10 mL via INTRAVENOUS
  Administered 2021-04-27 – 2021-04-28 (×3): 3 mL via INTRAVENOUS

## 2021-04-19 MED ORDER — SODIUM CHLORIDE 0.9 % IV SOLN: INTRAVENOUS | Status: DC | PRN

## 2021-04-19 MED ORDER — TRAZODONE HCL 50 MG PO TABS
100.0000 mg | ORAL_TABLET | Freq: Every evening | ORAL | Status: DC | PRN
  Administered 2021-04-20 – 2021-04-25 (×5): 100 mg via ORAL
  Filled 2021-04-19 (×5): qty 2

## 2021-04-19 MED ORDER — AMIODARONE HCL IN DEXTROSE 360-4.14 MG/200ML-% IV SOLN
30.0000 mg/h | INTRAVENOUS | Status: DC
  Administered 2021-04-19 – 2021-04-21 (×5): 30 mg/h via INTRAVENOUS
  Administered 2021-04-22 – 2021-04-28 (×23): 60 mg/h via INTRAVENOUS
  Administered 2021-04-28: 18:00:00 30 mg/h via INTRAVENOUS
  Administered 2021-04-28: 01:00:00 60 mg/h via INTRAVENOUS
  Administered 2021-04-29: 06:00:00 30 mg/h via INTRAVENOUS
  Filled 2021-04-19 (×22): qty 200
  Filled 2021-04-19: qty 400
  Filled 2021-04-19 (×7): qty 200

## 2021-04-19 NOTE — ED Provider Notes (Signed)
Emergency Medicine Provider Triage Evaluation Note  Victor [name] , a 60 y.o. male  was evaluated in triage.  Pt complains of 72-month history of shortness of breath, fatigue, generalized weakness.  He was evaluated at cardiology office this morning found to be hypotensive with new onset A. fib and was sent to the ER for further work-up, medications and admission.  He states that he recently started feeling worse which prompted his visit to his cardiologist this morning.  He denies any chest pain, but does report intermittent palpitations for the past few weeks.  Review of Systems  Positive: Shortness of breath, weakness, fatigue Negative: Chest pain  Physical Exam  BP (!) 116/94 (BP Location: Left Arm)   Pulse (!) 52   Temp 97.7 F (36.5 C) (Oral)   Resp 20   SpO2 98%  Gen:   Awake, no distress   Resp:  Normal effort  MSK:   Moves extremities without difficulty  Other:    Medical Decision Making  Medically screening exam initiated at 9:49 AM.  Appropriate orders placed.  Victor [name] was informed that the remainder of the evaluation will be completed by another provider, this initial triage assessment does not replace that evaluation, and the importance of remaining in the ED until their evaluation is complete.  Patient appears hemodynamically stable during the time of my evaluation.  However based on cardiology office note from this morning I feel that he requires full evaluation immediately. I have informed charge nurse that he needs to be roomed as soon as possible.   Delia Heady, PA-C 04/19/21 [id number]    Valarie Merino, MD 04/21/21 [phone]

## 2021-04-19 NOTE — Telephone Encounter (Signed)
PMP was Reviewed.  Lyrica e-scribed today.  
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
## 2021-04-19 NOTE — Telephone Encounter (Signed)
Patient called requesting refill on Pregabalin. Last filled #90 on 02/27/21. [facility] [address]

## 2021-04-19 NOTE — Progress Notes (Signed)
Notified Dr. [name] of patients continued pain in legs, clammy, diaphoretic and difficulty obtaining BP.  Orders received to increase levophed gtt to 22mcg and have RT place arterial line.
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
## 2021-04-19 NOTE — ED Triage Notes (Signed)
Pt with hx of CHF sent here from heart failure clinic for new onset afib. Pt reports shob x 2 weeks.

## 2021-04-19 NOTE — Progress Notes (Signed)
Heart Failure Navigator Progress Note  Assessed for Heart & Vascular TOC clinic readiness.  Patient does not meet criteria due to prior to hospitalization pt established with AHF clinic. Sent from AHF clinic today.   Navigator available for reassessment of patient.   Pricilla Holm, MSN, RN Heart Failure Nurse Navigator [phone]

## 2021-04-19 NOTE — Progress Notes (Signed)
Arrived to unit to place a USPIV. Nurse declined. Patient just had a PIV placed and PICC order in. Nurse aware to place iWatch to IV site. Msg sent to PICC nurse. Fran Lowes, RN VAST

## 2021-04-19 NOTE — Progress Notes (Signed)
Reds reading 42% Ruler: 39 Station: D

## 2021-04-19 NOTE — Progress Notes (Signed)
Patient seen earlier in the evening.   Very uncomfortable and diaphoretic. C/o severe neuropathy.   On NE 2 co-ox 37%. Lactate mildly elevated. Limbs cool.  I am concerned that "neuropathy" pain may actually be poor peripheral perfusion.   NE titrated up to 18. Milrinone added and turned to 0.25. Given low-dose dilaudid for discomfort.   Co-ox improved to 59%. Lactate down.   Patient more comfortable. AF rate also improving.   No role for mechanical support currently.   Additional CCT 45 mins.   Glori Bickers, MD  10:30 PM

## 2021-04-19 NOTE — ED Notes (Signed)
Dr. Ronnald Nian made aware of BP 84/63. Amio held at this time: contraindicated due to hypotension.

## 2021-04-19 NOTE — Addendum Note (Signed)
Encounter addended by: Rafael Bihari, FNP on: 04/19/2021 9:32 PM  Actions taken: Clinical Note Signed

## 2021-04-19 NOTE — ED Notes (Signed)
Verbal oprder received from [place], [place] with HF team, to give Amio bolus once systolic >50.
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
## 2021-04-19 NOTE — ED Notes (Signed)
Attempted to call report

## 2021-04-19 NOTE — Progress Notes (Signed)
Peripherally Inserted Central Catheter Placement  The IV Nurse has discussed with the patient and/or persons authorized to consent for the patient, the purpose of this procedure and the potential benefits and risks involved with this procedure.  The benefits include less needle sticks, lab draws from the catheter, and the patient may be discharged home with the catheter. Risks include, but not limited to, infection, bleeding, blood clot (thrombus formation), and puncture of an artery; nerve damage and irregular heartbeat and possibility to perform a PICC exchange if needed/ordered by physician.  Alternatives to this procedure were also discussed.  Bard Power PICC patient education guide, fact sheet on infection prevention and patient information card has been provided to patient /or left at bedside.    PICC Placement Documentation  PICC Triple Lumen 11/04/14 PICC Right Basilic 44 cm 1 cm (Active)  Indication for Insertion or Continuance of Line Vasoactive infusions 04/19/21 1644  Exposed Catheter (cm) 1 cm 04/19/21 1644  Site Assessment Clean;Dry;Intact 04/19/21 1644  Lumen #1 Status Flushed;Saline locked;Blood return noted 04/19/21 1644  Lumen #2 Status Flushed;Saline locked;Blood return noted 04/19/21 1644  Lumen #3 Status Flushed;Saline locked;Blood return noted 04/19/21 1644  Dressing Type Transparent;Securing device 04/19/21 1644  Dressing Status Clean;Dry;Intact 04/19/21 1644  Antimicrobial disc in place? Yes 04/19/21 [place] Not Applicable 05/15/30 3557  Dressing Intervention Other (Comment) 04/19/21 1644  Dressing Change Due 04/26/21 04/19/21 [place] 04/19/2021, 4:45 PM
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
## 2021-04-19 NOTE — Progress Notes (Addendum)
PM Rounding Note  PICC line has been placed. CVP 14-15. Give 80 mg IV Lasix now.   Co-ox low 38%. Add Milrinone 0.125. Continue NE. Titrate to maintain MAP >65    Start Amio and heparin gtts.   Notified by RN. Pt complaining of severe neuropathy pain b/l legs.   Home Lyrica 150 tid ordered. May also be 2/2 low flow. Check STAT lactic acid   D/w Dr. [name], will give 1 x dose of fentanyl for pain.   Lyda Jester, PA-C

## 2021-04-20 ENCOUNTER — Inpatient Hospital Stay (HOSPITAL_COMMUNITY): Payer: Medicare Other

## 2021-04-20 ENCOUNTER — Other Ambulatory Visit: Payer: Medicare Other

## 2021-04-20 ENCOUNTER — Ambulatory Visit: Payer: Medicare Other | Admitting: Hematology and Oncology

## 2021-04-20 DIAGNOSIS — I5023 Acute on chronic systolic (congestive) heart failure: Secondary | ICD-10-CM

## 2021-04-20 LAB — CBC
HCT: 50.2 % (ref 39.0–52.0)
Hemoglobin: 16.2 g/dL (ref 13.0–17.0)
MCH: 29.5 pg (ref 26.0–34.0)
MCHC: 32.3 g/dL (ref 30.0–36.0)
MCV: 91.4 fL (ref 80.0–100.0)
Platelets: 136 10*3/uL — ABNORMAL LOW (ref 150–400)
RBC: 5.49 MIL/uL (ref 4.22–5.81)
RDW: 13.3 % (ref 11.5–15.5)
WBC: 9.1 10*3/uL (ref 4.0–10.5)
nRBC: 0 % (ref 0.0–0.2)

## 2021-04-20 LAB — COOXEMETRY PANEL
Carboxyhemoglobin: 0.9 % (ref 0.5–1.5)
Carboxyhemoglobin: 0.9 % (ref 0.5–1.5)
Carboxyhemoglobin: 0.9 % (ref 0.5–1.5)
Methemoglobin: 0.7 % (ref 0.0–1.5)
Methemoglobin: 0.7 % (ref 0.0–1.5)
Methemoglobin: 0.5 % (ref 0.0–1.5)
O2 Saturation: 68.2 %
O2 Saturation: 63.1 %
O2 Saturation: 63.9 %
Total hemoglobin: 16.6 g/dL — ABNORMAL HIGH (ref 12.0–16.0)
Total hemoglobin: 17.5 g/dL — ABNORMAL HIGH (ref 12.0–16.0)
Total hemoglobin: 17.5 g/dL — ABNORMAL HIGH (ref 12.0–16.0)

## 2021-04-20 LAB — ECHOCARDIOGRAM COMPLETE
Area-P 1/2: 8.29 cm2
Calc EF: 28 %
Height: 73 in
MV VTI: 0.52 cm2
Radius: 0.7 cm
S' Lateral: 7.5 cm
Single Plane A2C EF: 24.9 %
Single Plane A4C EF: 28.2 %
Weight: 4430.36 oz

## 2021-04-20 LAB — BASIC METABOLIC PANEL
Anion gap: 10 (ref 5–15)
BUN: 27 mg/dL — ABNORMAL HIGH (ref 6–20)
CO2: 24 mmol/L (ref 22–32)
Calcium: 8.7 mg/dL — ABNORMAL LOW (ref 8.9–10.3)
Chloride: 97 mmol/L — ABNORMAL LOW (ref 98–111)
Creatinine, Ser: 1.98 mg/dL — ABNORMAL HIGH (ref 0.61–1.24)
GFR, Estimated: 38 mL/min — ABNORMAL LOW (ref >60)
Glucose, Bld: 160 mg/dL — ABNORMAL HIGH (ref 70–99)
Potassium: 3.4 mmol/L — ABNORMAL LOW (ref 3.5–5.1)
Sodium: 131 mmol/L — ABNORMAL LOW (ref 135–145)

## 2021-04-20 LAB — MAGNESIUM: Magnesium: 2 mg/dL (ref 1.7–2.4)

## 2021-04-20 LAB — HEPARIN LEVEL (UNFRACTIONATED): Heparin Unfractionated: 0.71 IU/mL — ABNORMAL HIGH (ref 0.30–0.70)

## 2021-04-20 MED ORDER — ORAL CARE MOUTH RINSE
15.0000 mL | Freq: Two times a day (BID) | OROMUCOSAL | Status: DC
  Administered 2021-04-20 – 2021-04-25 (×9): 15 mL via OROMUCOSAL

## 2021-04-20 MED ORDER — PERFLUTREN LIPID MICROSPHERE
1.0000 mL | INTRAVENOUS | Status: AC | PRN
Start: 2021-04-20 — End: 2021-04-20
  Administered 2021-04-20: 2 mL via INTRAVENOUS
  Filled 2021-04-20: qty 10

## 2021-04-20 MED ORDER — NOREPINEPHRINE 16 MG/250ML-% IV SOLN
2.0000 ug/min | INTRAVENOUS | Status: DC
  Administered 2021-04-20: 16 ug/min via INTRAVENOUS
  Administered 2021-04-22: 5 ug/min via INTRAVENOUS
  Filled 2021-04-20: qty 250

## 2021-04-20 MED ORDER — POTASSIUM CHLORIDE CRYS ER 20 MEQ PO TBCR
40.0000 meq | EXTENDED_RELEASE_TABLET | ORAL | Status: AC
  Administered 2021-04-20 (×2): 40 meq via ORAL
  Filled 2021-04-20 (×2): qty 2
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
## 2021-04-20 NOTE — Progress Notes (Signed)
°  Echocardiogram 2D Echocardiogram has been performed.  Victor [name] M 04/20/2021, 10:47 AM
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
## 2021-04-20 NOTE — Plan of Care (Signed)
  Problem: Education: Goal: Ability to demonstrate management of disease process will improve Outcome: Progressing Goal: Ability to verbalize understanding of medication therapies will improve Outcome: Progressing   

## 2021-04-21 ENCOUNTER — Inpatient Hospital Stay (HOSPITAL_COMMUNITY): Payer: Medicare Other

## 2021-04-21 ENCOUNTER — Encounter (HOSPITAL_COMMUNITY): Payer: Self-pay | Admitting: Internal Medicine

## 2021-04-21 ENCOUNTER — Inpatient Hospital Stay (HOSPITAL_COMMUNITY): Payer: Medicare Other | Admitting: Certified Registered Nurse Anesthetist

## 2021-04-21 ENCOUNTER — Encounter (HOSPITAL_COMMUNITY)
Admission: EM | Disposition: A | Discharge status: Other Acute Inpatient Hospital | Payer: Self-pay | Source: Home / Self Care | Attending: Internal Medicine

## 2021-04-21 DIAGNOSIS — I4891 Unspecified atrial fibrillation: Secondary | ICD-10-CM

## 2021-04-21 HISTORY — PX: CARDIOVERSION: SHX1299

## 2021-04-21 HISTORY — PX: TEE WITHOUT CARDIOVERSION: SHX5443

## 2021-04-21 LAB — COOXEMETRY PANEL
Carboxyhemoglobin: 0.9 % (ref 0.5–1.5)
Carboxyhemoglobin: 1.3 % (ref 0.5–1.5)
Methemoglobin: 0.6 % (ref 0.0–1.5)
Methemoglobin: 0.6 % (ref 0.0–1.5)
O2 Saturation: 63.8 %
O2 Saturation: 63.5 %
Total hemoglobin: 17.2 g/dL — ABNORMAL HIGH (ref 12.0–16.0)
Total hemoglobin: 15.3 g/dL (ref 12.0–16.0)

## 2021-04-21 LAB — HEPARIN LEVEL (UNFRACTIONATED): Heparin Unfractionated: 0.62 IU/mL (ref 0.30–0.70)

## 2021-04-21 LAB — CBC
HCT: 50.3 % (ref 39.0–52.0)
Hemoglobin: 17 g/dL (ref 13.0–17.0)
MCH: 30.4 pg (ref 26.0–34.0)
MCHC: 33.8 g/dL (ref 30.0–36.0)
MCV: 89.8 fL (ref 80.0–100.0)
Platelets: 159 10*3/uL (ref 150–400)
RBC: 5.6 MIL/uL (ref 4.22–5.81)
RDW: 13.4 % (ref 11.5–15.5)
WBC: 8.1 10*3/uL (ref 4.0–10.5)
nRBC: 0 % (ref 0.0–0.2)

## 2021-04-21 LAB — BASIC METABOLIC PANEL
Anion gap: 10 (ref 5–15)
BUN: 21 mg/dL — ABNORMAL HIGH (ref 6–20)
CO2: 24 mmol/L (ref 22–32)
Calcium: 8.8 mg/dL — ABNORMAL LOW (ref 8.9–10.3)
Chloride: 99 mmol/L (ref 98–111)
Creatinine, Ser: 1.52 mg/dL — ABNORMAL HIGH (ref 0.61–1.24)
GFR, Estimated: 52 mL/min — ABNORMAL LOW (ref >60)
Glucose, Bld: 200 mg/dL — ABNORMAL HIGH (ref 70–99)
Potassium: 4.1 mmol/L (ref 3.5–5.1)
Sodium: 133 mmol/L — ABNORMAL LOW (ref 135–145)

## 2021-04-21 SURGERY — ECHOCARDIOGRAM, TRANSESOPHAGEAL
Anesthesia: Monitor Anesthesia Care

## 2021-04-21 MED ORDER — PROPOFOL 500 MG/50ML IV EMUL
INTRAVENOUS | Status: DC | PRN
  Administered 2021-04-21: 50 ug/kg/min via INTRAVENOUS

## 2021-04-21 MED ORDER — MIDAZOLAM HCL 2 MG/2ML IJ SOLN
INTRAMUSCULAR | Status: AC
  Filled 2021-04-21: qty 2

## 2021-04-21 MED ORDER — BUTAMBEN-TETRACAINE-BENZOCAINE 2-2-14 % EX AERO
INHALATION_SPRAY | CUTANEOUS | Status: DC | PRN
  Administered 2021-04-21: 2 via TOPICAL

## 2021-04-21 MED ORDER — MIDAZOLAM HCL 2 MG/2ML IJ SOLN
INTRAMUSCULAR | Status: DC | PRN
  Administered 2021-04-21: 2 mg via INTRAVENOUS

## 2021-04-21 MED ORDER — FUROSEMIDE 40 MG PO TABS
80.0000 mg | ORAL_TABLET | Freq: Two times a day (BID) | ORAL | Status: DC
  Administered 2021-04-21 – 2021-04-22 (×2): 80 mg via ORAL
  Filled 2021-04-21 (×2): qty 2

## 2021-04-21 MED ORDER — KETAMINE HCL 10 MG/ML IJ SOLN
INTRAMUSCULAR | Status: DC | PRN
  Administered 2021-04-21: 50 mg via INTRAVENOUS

## 2021-04-21 MED ORDER — PHENOL 1.4 % MT LIQD
1.0000 | OROMUCOSAL | Status: DC | PRN
  Filled 2021-04-21 (×2): qty 177

## 2021-04-21 MED ORDER — KETAMINE HCL 50 MG/5ML IJ SOSY
PREFILLED_SYRINGE | INTRAMUSCULAR | Status: AC
  Filled 2021-04-21: qty 5

## 2021-04-21 MED ORDER — SODIUM CHLORIDE 0.9 % IV SOLN
INTRAVENOUS | Status: AC | PRN
  Administered 2021-04-21: 500 mL via INTRAMUSCULAR

## 2021-04-21 MED ORDER — PROPOFOL 10 MG/ML IV BOLUS
INTRAVENOUS | Status: DC | PRN
  Administered 2021-04-21 (×2): 10 mg via INTRAVENOUS

## 2021-04-21 NOTE — Interval H&P Note (Signed)
History and Physical Interval Note:  04/21/2021 1:21 PM  Victor [name]  has presented today for surgery, with the diagnosis of afib.  The various methods of treatment have been discussed with the patient and family. After consideration of risks, benefits and other options for treatment, the patient has consented to  Procedure(s): TRANSESOPHAGEAL ECHOCARDIOGRAM (TEE) (N/A) CARDIOVERSION (N/A) as a surgical intervention.  The patient's history has been reviewed, patient examined, no change in status, stable for surgery.  I have reviewed the patient's chart and labs.  Questions were answered to the patient's satisfaction.     [name]
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
## 2021-04-21 NOTE — Anesthesia Procedure Notes (Signed)
Procedure Name: MAC Date/Time: 04/21/2021 1:18 PM Performed by: Carolan Clines, CRNA Pre-anesthesia Checklist: Patient identified, Emergency Drugs available, Suction available and Patient being monitored Patient Re-evaluated:Patient Re-evaluated prior to induction Oxygen Delivery Method: Nasal cannula Dental Injury: Teeth and Oropharynx as per pre-operative assessment

## 2021-04-21 NOTE — Progress Notes (Addendum)
Advanced Heart Failure Rounding Note  PCP-Cardiologist: Larae Grooms, MD  [facility]: Dr. [name]   Subjective:    12/14: Admitted w/ a/c CHF w/ low output and new Afib w/ RVR. Co-ox 38%. Placed on NE, milrinone and amio gtt.   Echo EF 20-25%, RV normal. Mod MR   On Milrinone 0.25 + NE 5. Co-ox 64%. BP stable. CVP 7  SCr improving, 1.70>>1.98>>1.52 K 4.1   Remains in rapid afib, 130s-140s. Amio gtt at 30/Hr. On for DCCV today.   Feels ok. Breathing improved. No current resting dyspnea, still w/ mild orthopnea. Denies chest pain. Leg pain resolved.     Objective:   Weight Range: 125.5 kg Body mass index is 36.5 kg/m.   Vital Signs:   Temp:  [98.2 F (36.8 C)-98.7 F (37.1 C)] 98.5 F (36.9 C) (12/16 0400) Pulse Rate:  [62-210] 116 (12/16 0600) Resp:  [13-34] 30 (12/16 0600) BP: (75-156)/(59-144) 107/85 (12/16 0600) SpO2:  [90 %-96 %] 95 % (12/16 0600) Weight:  [125.5 kg] 125.5 kg (12/16 0500) Last BM Date: 04/18/21  Weight change: Filed Weights   04/19/21 1500 04/20/21 0600 04/21/21 0500  Weight: 121 kg 125.6 kg 125.5 kg    Intake/Output:   Intake/Output Summary (Last 24 hours) at 04/21/2021 0731 Last data filed at 04/21/2021 0400 Gross per 24 hour  Intake 2033.84 ml  Output 1270 ml  Net 763.84 ml      Physical Exam    CVP 7  General:  Well appearing. No respiratory difficulty HEENT: normal Neck: supple. JVD ~6 cm. Carotids 2+ bilat; no bruits. No lymphadenopathy or thyromegaly appreciated. Cor: PMI nondisplaced. Irregularly irregular rhythm and tachy rate 2/6 MR murmur  Lungs: decreased BS at the bases, no crackles. No wheezing  Abdomen: obese, soft, nontender, nondistended. No hepatosplenomegaly. No bruits or masses. Good bowel sounds. Extremities: warm, no cyanosis, clubbing, rash, trace b/l LE edema Neuro: alert & oriented x 3, cranial nerves grossly intact. moves all 4 extremities w/o difficulty. Affect pleasant.    Telemetry   Afib  w/ RVR 130s , NSVT 8 beats, personally reviewed   EKG    No new EKG to review   Labs    CBC Recent Labs    04/19/21 0954 04/20/21 0403 04/21/21 0434  WBC 6.2 9.1 8.1  NEUTROABS 3.4  --   --   HGB 19.2* 16.2 17.0  HCT 59.1* 50.2 50.3  MCV 91.3 91.4 89.8  PLT 201 136* 606   Basic Metabolic Panel Recent Labs    04/19/21 0954 04/20/21 0534 04/21/21 0434  NA 134* 131* 133*  K 4.5 3.4* 4.1  CL 103 97* 99  CO2 19* 24 24  GLUCOSE 132* 160* 200*  BUN 26* 27* 21*  CREATININE 1.70* 1.98* 1.52*  CALCIUM 9.2 8.7* 8.8*  MG 2.1 2.0  --    Liver Function Tests Recent Labs    04/19/21 1101  AST 29  ALT 35  ALKPHOS 49  BILITOT 1.9*  PROT 8.0  ALBUMIN 4.4   No results for input(s): LIPASE, AMYLASE in the last 72 hours. Cardiac Enzymes No results for input(s): CKTOTAL, CKMB, CKMBINDEX, TROPONINI in the last 72 hours.  BNP: BNP (last 3 results) Recent Labs    01/23/21 1104 03/08/21 1605 04/19/21 0954  BNP 119.8* 390.0* 154.2*    ProBNP (last 3 results) No results for input(s): PROBNP in the last 8760 hours.   D-Dimer Recent Labs    04/19/21 1706  DDIMER 0.32  Hemoglobin A1C No results for input(s): HGBA1C in the last 72 hours. Fasting Lipid Panel No results for input(s): CHOL, HDL, LDLCALC, TRIG, CHOLHDL, LDLDIRECT in the last 72 hours. Thyroid Function Tests Recent Labs    04/19/21 1101  TSH 1.055    Other results:   Imaging    ECHOCARDIOGRAM COMPLETE  Result Date: 04/20/2021    ECHOCARDIOGRAM REPORT   Patient Name:   Victor [name] Date of Exam: 04/20/2021 Medical Rec #:  [id number]        Height:       73.0 in Accession #:    [id number]       Weight:       276.9 lb Date of Birth:  22-Jul-1960        BSA:          2.471 m Patient Age:    7 years         BP:           110/99 mmHg Patient Gender: M                HR:           86 bpm. Exam Location:  Inpatient Procedure: 2D Echo, Cardiac Doppler, Color Doppler and Intracardiac             Opacification Agent Indications:    Congestive Heart Failure I50.9  History:        Patient has prior history of Echocardiogram examinations, most                 recent 01/23/2021. Risk Factors:Dyslipidemia.  Sonographer:    Darlina Sicilian RDCS Referring Phys: Frederick  1. Global hypokinesis with relative sparing of the anterolateral and basal to mid anterior myocardium. Left ventricular ejection fraction, by estimation, is 20 to 25%. The left ventricle has severely decreased function. The left ventricle demonstrates global hypokinesis. The left ventricular internal cavity size was severely dilated. Left ventricular diastolic parameters are indeterminate.  2. Right ventricular systolic function is normal. The right ventricular size is normal.  3. Left atrial size was severely dilated.  4. The mitral valve is normal in structure. Moderate mitral valve regurgitation. No evidence of mitral stenosis.  5. The aortic valve is tricuspid. Aortic valve regurgitation is trivial. No aortic stenosis is present.  6. Aortic dilatation noted. There is mild dilatation of the aortic root, measuring 41 mm.  7. The inferior vena cava is normal in size with greater than 50% respiratory variability, suggesting right atrial pressure of 3 mmHg. FINDINGS  Left Ventricle: Global hypokinesis with relative sparing of the anterolateral and basal to mid anterior myocardium. Left ventricular ejection fraction, by estimation, is 20 to 25%. The left ventricle has severely decreased function. The left ventricle demonstrates global hypokinesis. Definity contrast agent was given IV to delineate the left ventricular endocardial borders. The left ventricular internal cavity size was severely dilated. There is no left ventricular hypertrophy. Left ventricular diastolic parameters are indeterminate. Right Ventricle: The right ventricular size is normal. No increase in right ventricular wall thickness. Right ventricular  systolic function is normal. Left Atrium: Left atrial size was severely dilated. Right Atrium: Right atrial size was normal in size. Pericardium: There is no evidence of pericardial effusion. Mitral Valve: The mitral valve is normal in structure. Moderate mitral valve regurgitation. No evidence of mitral valve stenosis. MV peak gradient, 74.1 mmHg. The mean mitral valve gradient is 40.5 mmHg. Tricuspid Valve: The tricuspid valve is normal  in structure. Tricuspid valve regurgitation is trivial. No evidence of tricuspid stenosis. Aortic Valve: The aortic valve is tricuspid. Aortic valve regurgitation is trivial. No aortic stenosis is present. Pulmonic Valve: The pulmonic valve was normal in structure. Pulmonic valve regurgitation is trivial. No evidence of pulmonic stenosis. Aorta: Aortic dilatation noted. There is mild dilatation of the aortic root, measuring 41 mm. Venous: The inferior vena cava was not well visualized. The inferior vena cava is normal in size with greater than 50% respiratory variability, suggesting right atrial pressure of 3 mmHg. IAS/Shunts: No atrial level shunt detected by color flow Doppler.  LEFT VENTRICLE PLAX 2D LVIDd:         8.40 cm LVIDs:         7.50 cm LV PW:         0.80 cm LV IVS:        0.80 cm LVOT diam:     2.30 cm LV SV:         46 LV SV Index:   19 LVOT Area:     4.15 cm  LV Volumes (MOD) LV vol d, MOD A2C: 305.0 ml LV vol d, MOD A4C: 355.0 ml LV vol s, MOD A2C: 229.0 ml LV vol s, MOD A4C: 255.0 ml LV SV MOD A2C:     76.0 ml LV SV MOD A4C:     355.0 ml LV SV MOD BP:      94.4 ml RIGHT VENTRICLE RV S prime:     9.36 cm/s TAPSE (M-mode): 1.3 cm LEFT ATRIUM            Index        RIGHT ATRIUM           Index LA diam:      6.20 cm  2.51 cm/m   RA Area:     18.30 cm LA Vol (A4C): 122.0 ml 49.38 ml/m  RA Volume:   46.50 ml  18.82 ml/m  AORTIC VALVE LVOT Vmax:   86.30 cm/s LVOT Vmean:  58.700 cm/s LVOT VTI:    0.111 m  AORTA Ao Root diam: 4.10 cm MITRAL VALVE MV Area (PHT): 8.29  cm     SHUNTS MV Area VTI:   0.52 cm     Systemic VTI:  0.11 m MV Peak grad:  74.1 mmHg    Systemic Diam: 2.30 cm MV Mean grad:  40.5 mmHg MV Vmax:       4.30 m/s MV Vmean:      296.0 cm/s MV Decel Time: 92 msec MR PISA:        3.08 cm MR PISA Radius: 0.70 cm MV E velocity: 155.00 cm/s Skeet Latch MD Electronically signed by Skeet Latch MD Signature Date/Time: 04/20/2021/1:01:07 PM    Final      Medications:     Scheduled Medications:  Chlorhexidine Gluconate Cloth  6 each Topical Daily   mouth rinse  15 mL Mouth Rinse BID   pregabalin  150 mg Oral TID   sodium chloride flush  10-40 mL Intracatheter Q12H   sodium chloride flush  3 mL Intravenous Q12H    Infusions:  sodium chloride     sodium chloride     sodium chloride     amiodarone 30 mg/hr (04/21/21 0400)   heparin 1,750 Units/hr (04/21/21 0520)   milrinone 0.25 mcg/kg/min (04/21/21 0400)   norepinephrine (LEVOPHED) Adult infusion 5 mcg/min (04/21/21 0400)    PRN Medications: sodium chloride, Place/Maintain arterial line **AND** sodium chloride, acetaminophen,  HYDROmorphone (DILAUDID) injection, sodium chloride flush, sodium chloride flush, traZODone   Assessment/Plan   1. Acute on Chronic Systolic Heart Failure>>Cardiogenic Shock  - diagnosed in 2014. Cath at that time normal cors with EF 20-25% - Echo 10/20 EF 20-25% RV ok. Moderate to severe MR - Cath 11/20 coronaries normal - Echo 7/21 EF 20-25% RV ok mild to mod MR  - Echo 01/23/21 EF 25% RV ok. Mild MR - CPX 06/2020 with overall moderate limitation due to CHF and body habitus.   - Now admitted w/ worsening symptoms, now NYHA Class III-IIIb and low output. In setting of new Afib w/ RVR. Initial Co-ox 38%>>Milrinone and NE added - Echo EF 20-25%, RV ok. Mod MR  - on Milrinone 0.25 + NE 5. Co-ox 64%  - CVP 7. Restart PO Lasix 80 mg bid  - Plan DCCV today  - Hold home Entresto/Farxiga w/ AKI and hypotension  - Hold ? blocker w/ hypotension and low output   - Has seen Dr. Caryl Comes for ICD +/- CRT but  was reluctant to proceed - need to consider w/u for Advanced therapies, transplant vs VAD    2. Atrial Fibrillation w/ RVR - New. Duration unknown.  - TFTs normal  - Respiratory Panel negative  - C/w Amio gtt for rate control.  - C/w IV heparin for now until need for procedures ruled out  - TEE/DCCV today  - Keep K > 4.0 and Mg > 2.0  - Sleep study 5/21>>PSG very mild OSA . No CPAP for now    3. AKI - Baseline SCr ~1.1 - Progressive increase over the last month, 1.3>1.5>1.70>1.98>1.52 today  - suspect 2/2 cardiorenal/low output - inotropes + diuresis per above  - Continue NE to support BP    4. Polycythemia - Sleep study not overly impressive. - Being followed at [facility] for polycythemia. Work-up including JAK2 mutation and BMBx have been negative.  - Plan for therapeutic phlebotomy if HCT >= 54 (50 today) - starting a/c given new Afib    5. Mitral Regurgitation  - Mod on echo   6. NSVT - monitor w/ dual inotropes  - continue amio gtt - Keep K > 4.0 and Mg >2.0   7. Hypokalemia - K stable today at 4.1  - supp w/ KCL w/diuresis    Length of Stay: 2  Lyda Jester, PA-C  04/21/2021, 7:31 AM  Advanced Heart Failure Team Pager [phone] (M-F; 7a - 5p)  Please contact [facility] for night-coverage after hours (5p -7a ) and weekends on [email or web address]  Agree with above.   Remains on milrinone and NE. Co-ox and volume status improved. Denies further SOB, orthopnea or PND.   Remains in AF despite IV amio. On heparin  General: Sitting up in bed No resp difficulty HEENT: normal Neck: supple. Jvp 7 Carotids 2+ bilat; no bruits. No lymphadenopathy or thryomegaly appreciated. Cor: PMI nondisplaced. Irregular tachy No rubs, gallops or murmurs. Lungs: clear Abdomen: soft, nontender, nondistended. No hepatosplenomegaly. No bruits or masses. Good bowel sounds. Extremities: no cyanosis, clubbing, rash, edema  + LLE chronic  wound  Neuro: alert & orientedx3, cranial nerves grossly intact. moves all 4 extremities w/o difficulty. Affect pleasant  Hemodynamically improved on milrinone and NE. Plan TEE/DC-CV today. Then wean NE (followed by milrinone) as tolerated. Switch to Eliquis prior to d/c.  CRITICAL CARE Performed by: Glori Bickers  Total critical care time: 35 minutes  Critical care time was exclusive of separately billable procedures and treating other  patients.  Critical care was necessary to treat or prevent imminent or life-threatening deterioration.  Critical care was time spent personally by me (independent of midlevel providers or residents) on the following activities: development of treatment plan with patient and/or surrogate as well as nursing, discussions with consultants, evaluation of patient's response to treatment, examination of patient, obtaining history from patient or surrogate, ordering and performing treatments and interventions, ordering and review of laboratory studies, ordering and review of radiographic studies, pulse oximetry and re-evaluation of patient's condition.  Glori Bickers, MD  1:19 PM

## 2021-04-21 NOTE — Progress Notes (Signed)
°  Echocardiogram Echocardiogram Transesophageal has been performed.  Victor [name] 04/21/2021, 1:55 PM

## 2021-04-21 NOTE — Anesthesia Preprocedure Evaluation (Addendum)
Anesthesia Evaluation  Patient identified by MRN, date of birth, ID band Patient awake    Reviewed: Allergy & Precautions, NPO status , Patient's Chart, lab work & pertinent test results  History of Anesthesia Complications Negative for: history of anesthetic complications  Airway Mallampati: II  TM Distance: >3 FB Neck ROM: Full    Dental  (+) Teeth Intact   Pulmonary asthma , sleep apnea (very mild, no CPAP) ,    Pulmonary exam normal        Cardiovascular hypertension, +CHF  (-) CAD + dysrhythmias Atrial Fibrillation  Rhythm:Irregular Rate:Tachycardia   Normal coronaries by 2020 cath  Echo 04/20/21: EF 20-25%, global hypokinesis, normal RVSF, severe LVE/LAE, mod MR, aortic root 41 mm   Neuro/Psych negative neurological ROS     GI/Hepatic negative GI ROS, Neg liver ROS,   Endo/Other  negative endocrine ROS  Renal/GU ARFRenal disease (Cr 1.52)  negative genitourinary   Musculoskeletal negative musculoskeletal ROS (+)   Abdominal   Peds  Hematology negative hematology ROS (+)   Anesthesia Other Findings  Admitted with acute on chronic systolic HF/cardiogenic shock in setting of new onset Afib w/ RVR. On NE/milrinone.   Reproductive/Obstetrics                           Anesthesia Physical Anesthesia Plan  ASA: 4  Anesthesia Plan: MAC   Post-op Pain Management: Minimal or no pain anticipated   Induction: Intravenous  PONV Risk Score and Plan: 1 and Propofol infusion, TIVA and Treatment may vary due to age or medical condition  Airway Management Planned: Natural Airway, Nasal Cannula and Simple Face Mask  Additional Equipment: None  Intra-op Plan:   Post-operative Plan:   Informed Consent: I have reviewed the patients History and Physical, chart, labs and discussed the procedure including the risks, benefits and alternatives for the proposed anesthesia with the patient or  authorized representative who has indicated his/her understanding and acceptance.       Plan Discussed with:   Anesthesia Plan Comments:        Anesthesia Quick Evaluation

## 2021-04-21 NOTE — CV Procedure (Signed)
° °  TRANSESOPHAGEAL ECHOCARDIOGRAM GUIDED DIRECT CURRENT CARDIOVERSION  NAME:  Victor [name]   MRN: [id number] DOB:  Nov 02, 1960   ADMIT DATE: 04/19/2021  INDICATIONS:  Atrial fibrillation  PROCEDURE:   Informed consent was obtained prior to the procedure. The risks, benefits and alternatives for the procedure were discussed and the patient comprehended these risks.  Risks include, but are not limited to, cough, sore throat, vomiting, nausea, somnolence, esophageal and stomach trauma or perforation, bleeding, low blood pressure, aspiration, pneumonia, infection, trauma to the teeth and death.    After a procedural time-out, the patient was sedated by the anesthesia service. The transesophageal probe was inserted in the esophagus and stomach without difficulty and multiple views were obtained.   TEE was difficult due to patient's tenuous respiratory status.   FINDINGS:  LEFT VENTRICLE: Markedly dilated EF = 20-25%  RIGHT VENTRICLE: Moderately to severely hypokinetic  LEFT ATRIUM: Massively dilated  LEFT ATRIAL APPENDAGE: No clot  RIGHT ATRIUM: Severely dilated  AORTIC VALVE:  Trileaflet. RCC calcified. Mild AI  MITRAL VALVE:    Severe central (functional) MR  TRICUSPID VALVE: Mild TR  PULMONIC VALVE: Grossly normal   INTERATRIAL SEPTUM: No PFO/ASD  PERICARDIUM: No effusion  DESCENDING AORTA: Mild plaque   CARDIOVERSION:     Indications:  Atrial Fibrillation  Procedure Details:  Once the TEE was complete, the patient had the defibrillator pads placed in the anterior and posterior position. Once an appropriate level of sedation was achieved, the patient received a single biphasic, synchronized 200J shock with prompt conversion to sinus rhythm. No apparent complications.   Glori Bickers, MD  7:30 PM

## 2021-04-21 NOTE — Transfer of Care (Signed)
Immediate Anesthesia Transfer of Care Note  Patient: Bradyn Soward  Procedure(s) Performed: TRANSESOPHAGEAL ECHOCARDIOGRAM (TEE) CARDIOVERSION  Patient Location: PACU  Anesthesia Type:MAC  Level of Consciousness: awake, alert  and oriented  Airway & Oxygen Therapy: Patient Spontanous Breathing  Post-op Assessment: Report given to RN and Post -op Vital signs reviewed and stable  Post vital signs: Reviewed and stable  Last Vitals:  Vitals Value Taken Time  BP 101/82 04/21/21 1357  Temp    Pulse 104 04/21/21 1400  Resp 19 04/21/21 1400  SpO2 94 % 04/21/21 1400  Vitals shown include unvalidated device data.  Last Pain:  Vitals:   04/21/21 1200  TempSrc:   PainSc: 0-No pain      Patients Stated Pain Goal: 0 (41/74/08 1448)  Complications: No notable events documented.

## 2021-04-21 NOTE — H&P (View-Only) (Signed)
Advanced Heart Failure Rounding Note  PCP-Cardiologist: Larae Grooms, MD  [facility]: Dr. [name]   Subjective:    12/14: Admitted w/ a/c CHF w/ low output and new Afib w/ RVR. Co-ox 38%. Placed on NE, milrinone and amio gtt.   Echo EF 20-25%, RV normal. Mod MR   On Milrinone 0.25 + NE 5. Co-ox 64%. BP stable. CVP 7  SCr improving, 1.70>>1.98>>1.52 K 4.1   Remains in rapid afib, 130s-140s. Amio gtt at 30/Hr. On for DCCV today.   Feels ok. Breathing improved. No current resting dyspnea, still w/ mild orthopnea. Denies chest pain. Leg pain resolved.     Objective:   Weight Range: 125.5 kg Body mass index is 36.5 kg/m.   Vital Signs:   Temp:  [98.2 F (36.8 C)-98.7 F (37.1 C)] 98.5 F (36.9 C) (12/16 0400) Pulse Rate:  [62-210] 116 (12/16 0600) Resp:  [13-34] 30 (12/16 0600) BP: (75-156)/(59-144) 107/85 (12/16 0600) SpO2:  [90 %-96 %] 95 % (12/16 0600) Weight:  [125.5 kg] 125.5 kg (12/16 0500) Last BM Date: 04/18/21  Weight change: Filed Weights   04/19/21 1500 04/20/21 0600 04/21/21 0500  Weight: 121 kg 125.6 kg 125.5 kg    Intake/Output:   Intake/Output Summary (Last 24 hours) at 04/21/2021 0731 Last data filed at 04/21/2021 0400 Gross per 24 hour  Intake 2033.84 ml  Output 1270 ml  Net 763.84 ml      Physical Exam    CVP 7  General:  Well appearing. No respiratory difficulty HEENT: normal Neck: supple. JVD ~6 cm. Carotids 2+ bilat; no bruits. No lymphadenopathy or thyromegaly appreciated. Cor: PMI nondisplaced. Irregularly irregular rhythm and tachy rate 2/6 MR murmur  Lungs: decreased BS at the bases, no crackles. No wheezing  Abdomen: obese, soft, nontender, nondistended. No hepatosplenomegaly. No bruits or masses. Good bowel sounds. Extremities: warm, no cyanosis, clubbing, rash, trace b/l LE edema Neuro: alert & oriented x 3, cranial nerves grossly intact. moves all 4 extremities w/o difficulty. Affect pleasant.    Telemetry   Afib  w/ RVR 130s , NSVT 8 beats, personally reviewed   EKG    No new EKG to review   Labs    CBC Recent Labs    04/19/21 0954 04/20/21 0403 04/21/21 0434  WBC 6.2 9.1 8.1  NEUTROABS 3.4  --   --   HGB 19.2* 16.2 17.0  HCT 59.1* 50.2 50.3  MCV 91.3 91.4 89.8  PLT 201 136* 937   Basic Metabolic Panel Recent Labs    04/19/21 0954 04/20/21 0534 04/21/21 0434  NA 134* 131* 133*  K 4.5 3.4* 4.1  CL 103 97* 99  CO2 19* 24 24  GLUCOSE 132* 160* 200*  BUN 26* 27* 21*  CREATININE 1.70* 1.98* 1.52*  CALCIUM 9.2 8.7* 8.8*  MG 2.1 2.0  --    Liver Function Tests Recent Labs    04/19/21 1101  AST 29  ALT 35  ALKPHOS 49  BILITOT 1.9*  PROT 8.0  ALBUMIN 4.4   No results for input(s): LIPASE, AMYLASE in the last 72 hours. Cardiac Enzymes No results for input(s): CKTOTAL, CKMB, CKMBINDEX, TROPONINI in the last 72 hours.  BNP: BNP (last 3 results) Recent Labs    01/23/21 1104 03/08/21 1605 04/19/21 0954  BNP 119.8* 390.0* 154.2*    ProBNP (last 3 results) No results for input(s): PROBNP in the last 8760 hours.   D-Dimer Recent Labs    04/19/21 1706  DDIMER 0.32  Hemoglobin A1C No results for input(s): HGBA1C in the last 72 hours. Fasting Lipid Panel No results for input(s): CHOL, HDL, LDLCALC, TRIG, CHOLHDL, LDLDIRECT in the last 72 hours. Thyroid Function Tests Recent Labs    04/19/21 1101  TSH 1.055    Other results:   Imaging    ECHOCARDIOGRAM COMPLETE  Result Date: 04/20/2021    ECHOCARDIOGRAM REPORT   Patient Name:   Victor [name] Date of Exam: 04/20/2021 Medical Rec #:  [id number]        Height:       73.0 in Accession #:    [id number]       Weight:       276.9 lb Date of Birth:  1960-12-12        BSA:          2.471 m Patient Age:    60 years         BP:           110/99 mmHg Patient Gender: M                HR:           86 bpm. Exam Location:  Inpatient Procedure: 2D Echo, Cardiac Doppler, Color Doppler and Intracardiac             Opacification Agent Indications:    Congestive Heart Failure I50.9  History:        Patient has prior history of Echocardiogram examinations, most                 recent 01/23/2021. Risk Factors:Dyslipidemia.  Sonographer:    Darlina Sicilian RDCS Referring Phys: [place]  1. Global hypokinesis with relative sparing of the anterolateral and basal to mid anterior myocardium. Left ventricular ejection fraction, by estimation, is 20 to 25%. The left ventricle has severely decreased function. The left ventricle demonstrates global hypokinesis. The left ventricular internal cavity size was severely dilated. Left ventricular diastolic parameters are indeterminate.  2. Right ventricular systolic function is normal. The right ventricular size is normal.  3. Left atrial size was severely dilated.  4. The mitral valve is normal in structure. Moderate mitral valve regurgitation. No evidence of mitral stenosis.  5. The aortic valve is tricuspid. Aortic valve regurgitation is trivial. No aortic stenosis is present.  6. Aortic dilatation noted. There is mild dilatation of the aortic root, measuring 41 mm.  7. The inferior vena cava is normal in size with greater than 50% respiratory variability, suggesting right atrial pressure of 3 mmHg. FINDINGS  Left Ventricle: Global hypokinesis with relative sparing of the anterolateral and basal to mid anterior myocardium. Left ventricular ejection fraction, by estimation, is 20 to 25%. The left ventricle has severely decreased function. The left ventricle demonstrates global hypokinesis. Definity contrast agent was given IV to delineate the left ventricular endocardial borders. The left ventricular internal cavity size was severely dilated. There is no left ventricular hypertrophy. Left ventricular diastolic parameters are indeterminate. Right Ventricle: The right ventricular size is normal. No increase in right ventricular wall thickness. Right ventricular  systolic function is normal. Left Atrium: Left atrial size was severely dilated. Right Atrium: Right atrial size was normal in size. Pericardium: There is no evidence of pericardial effusion. Mitral Valve: The mitral valve is normal in structure. Moderate mitral valve regurgitation. No evidence of mitral valve stenosis. MV peak gradient, 74.1 mmHg. The mean mitral valve gradient is 40.5 mmHg. Tricuspid Valve: The tricuspid valve is normal  in structure. Tricuspid valve regurgitation is trivial. No evidence of tricuspid stenosis. Aortic Valve: The aortic valve is tricuspid. Aortic valve regurgitation is trivial. No aortic stenosis is present. Pulmonic Valve: The pulmonic valve was normal in structure. Pulmonic valve regurgitation is trivial. No evidence of pulmonic stenosis. Aorta: Aortic dilatation noted. There is mild dilatation of the aortic root, measuring 41 mm. Venous: The inferior vena cava was not well visualized. The inferior vena cava is normal in size with greater than 50% respiratory variability, suggesting right atrial pressure of 3 mmHg. IAS/Shunts: No atrial level shunt detected by color flow Doppler.  LEFT VENTRICLE PLAX 2D LVIDd:         8.40 cm LVIDs:         7.50 cm LV PW:         0.80 cm LV IVS:        0.80 cm LVOT diam:     2.30 cm LV SV:         46 LV SV Index:   19 LVOT Area:     4.15 cm  LV Volumes (MOD) LV vol d, MOD A2C: 305.0 ml LV vol d, MOD A4C: 355.0 ml LV vol s, MOD A2C: 229.0 ml LV vol s, MOD A4C: 255.0 ml LV SV MOD A2C:     76.0 ml LV SV MOD A4C:     355.0 ml LV SV MOD BP:      94.4 ml RIGHT VENTRICLE RV S prime:     9.36 cm/s TAPSE (M-mode): 1.3 cm LEFT ATRIUM            Index        RIGHT ATRIUM           Index LA diam:      6.20 cm  2.51 cm/m   RA Area:     18.30 cm LA Vol (A4C): 122.0 ml 49.38 ml/m  RA Volume:   46.50 ml  18.82 ml/m  AORTIC VALVE LVOT Vmax:   86.30 cm/s LVOT Vmean:  58.700 cm/s LVOT VTI:    0.111 m  AORTA Ao Root diam: 4.10 cm MITRAL VALVE MV Area (PHT): 8.29  cm     SHUNTS MV Area VTI:   0.52 cm     Systemic VTI:  0.11 m MV Peak grad:  74.1 mmHg    Systemic Diam: 2.30 cm MV Mean grad:  40.5 mmHg MV Vmax:       4.30 m/s MV Vmean:      296.0 cm/s MV Decel Time: 92 msec MR PISA:        3.08 cm MR PISA Radius: 0.70 cm MV E velocity: 155.00 cm/s Skeet Latch MD Electronically signed by Skeet Latch MD Signature Date/Time: 04/20/2021/1:01:07 PM    Final      Medications:     Scheduled Medications:  Chlorhexidine Gluconate Cloth  6 each Topical Daily   mouth rinse  15 mL Mouth Rinse BID   pregabalin  150 mg Oral TID   sodium chloride flush  10-40 mL Intracatheter Q12H   sodium chloride flush  3 mL Intravenous Q12H    Infusions:  sodium chloride     sodium chloride     sodium chloride     amiodarone 30 mg/hr (04/21/21 0400)   heparin 1,750 Units/hr (04/21/21 0520)   milrinone 0.25 mcg/kg/min (04/21/21 0400)   norepinephrine (LEVOPHED) Adult infusion 5 mcg/min (04/21/21 0400)    PRN Medications: sodium chloride, Place/Maintain arterial line **AND** sodium chloride, acetaminophen,  HYDROmorphone (DILAUDID) injection, sodium chloride flush, sodium chloride flush, traZODone   Assessment/Plan   1. Acute on Chronic Systolic Heart Failure>>Cardiogenic Shock  - diagnosed in 2014. Cath at that time normal cors with EF 20-25% - Echo 10/20 EF 20-25% RV ok. Moderate to severe MR - Cath 11/20 coronaries normal - Echo 7/21 EF 20-25% RV ok mild to mod MR  - Echo 01/23/21 EF 25% RV ok. Mild MR - CPX 06/2020 with overall moderate limitation due to CHF and body habitus.   - Now admitted w/ worsening symptoms, now NYHA Class III-IIIb and low output. In setting of new Afib w/ RVR. Initial Co-ox 38%>>Milrinone and NE added - Echo EF 20-25%, RV ok. Mod MR  - on Milrinone 0.25 + NE 5. Co-ox 64%  - CVP 7. Restart PO Lasix 80 mg bid  - Plan DCCV today  - Hold home Entresto/Farxiga w/ AKI and hypotension  - Hold ? blocker w/ hypotension and low output   - Has seen Dr. Caryl Comes for ICD +/- CRT but  was reluctant to proceed - need to consider w/u for Advanced therapies, transplant vs VAD    2. Atrial Fibrillation w/ RVR - New. Duration unknown.  - TFTs normal  - Respiratory Panel negative  - C/w Amio gtt for rate control.  - C/w IV heparin for now until need for procedures ruled out  - TEE/DCCV today  - Keep K > 4.0 and Mg > 2.0  - Sleep study 5/21>>PSG very mild OSA . No CPAP for now    3. AKI - Baseline SCr ~1.1 - Progressive increase over the last month, 1.3>1.5>1.70>1.98>1.52 today  - suspect 2/2 cardiorenal/low output - inotropes + diuresis per above  - Continue NE to support BP    4. Polycythemia - Sleep study not overly impressive. - Being followed at [facility] for polycythemia. Work-up including JAK2 mutation and BMBx have been negative.  - Plan for therapeutic phlebotomy if HCT >= 54 (50 today) - starting a/c given new Afib    5. Mitral Regurgitation  - Mod on echo   6. NSVT - monitor w/ dual inotropes  - continue amio gtt - Keep K > 4.0 and Mg >2.0   7. Hypokalemia - K stable today at 4.1  - supp w/ KCL w/diuresis    Length of Stay: 2  Lyda Jester, PA-C  04/21/2021, 7:31 AM  Advanced Heart Failure Team Pager [phone] (M-F; 7a - 5p)  Please contact [facility] for night-coverage after hours (5p -7a ) and weekends on [email or web address]  Agree with above.   Remains on milrinone and NE. Co-ox and volume status improved. Denies further SOB, orthopnea or PND.   Remains in AF despite IV amio. On heparin  General: Sitting up in bed No resp difficulty HEENT: normal Neck: supple. Jvp 7 Carotids 2+ bilat; no bruits. No lymphadenopathy or thryomegaly appreciated. Cor: PMI nondisplaced. Irregular tachy No rubs, gallops or murmurs. Lungs: clear Abdomen: soft, nontender, nondistended. No hepatosplenomegaly. No bruits or masses. Good bowel sounds. Extremities: no cyanosis, clubbing, rash, edema  + LLE chronic  wound  Neuro: alert & orientedx3, cranial nerves grossly intact. moves all 4 extremities w/o difficulty. Affect pleasant  Hemodynamically improved on milrinone and NE. Plan TEE/DC-CV today. Then wean NE (followed by milrinone) as tolerated. Switch to Eliquis prior to d/c.  CRITICAL CARE Performed by: Glori Bickers  Total critical care time: 35 minutes  Critical care time was exclusive of separately billable procedures and treating other  patients.  Critical care was necessary to treat or prevent imminent or life-threatening deterioration.  Critical care was time spent personally by me (independent of midlevel providers or residents) on the following activities: development of treatment plan with patient and/or surrogate as well as nursing, discussions with consultants, evaluation of patient's response to treatment, examination of patient, obtaining history from patient or surrogate, ordering and performing treatments and interventions, ordering and review of laboratory studies, ordering and review of radiographic studies, pulse oximetry and re-evaluation of patient's condition.  Glori Bickers, MD  1:19 PM

## 2021-04-21 NOTE — Anesthesia Postprocedure Evaluation (Signed)
Anesthesia Post Note  Patient: Victor [name]  Procedure(s) Performed: TRANSESOPHAGEAL ECHOCARDIOGRAM (TEE) CARDIOVERSION     Patient location during evaluation: PACU Anesthesia Type: MAC Level of consciousness: awake and alert Pain management: pain level controlled Vital Signs Assessment: post-procedure vital signs reviewed and stable Respiratory status: spontaneous breathing, nonlabored ventilation and respiratory function stable Cardiovascular status: blood pressure returned to baseline and stable Postop Assessment: no apparent nausea or vomiting Anesthetic complications: no   No notable events documented.  Last Vitals:  Vitals:   04/21/21 1239 04/21/21 1400  BP: 110/60 101/82  Pulse: (!) 105 (!) 104  Resp:  19  Temp:  37 C  SpO2: 95% 94%    Last Pain:  Vitals:   04/21/21 1400  TempSrc:   PainSc: 0-No pain                 Lidia Collum

## 2021-04-21 NOTE — Plan of Care (Signed)

## 2021-04-22 LAB — GLUCOSE, CAPILLARY
Glucose-Capillary: 363 mg/dL — ABNORMAL HIGH (ref 70–99)
Glucose-Capillary: 139 mg/dL — ABNORMAL HIGH (ref 70–99)
Glucose-Capillary: 165 mg/dL — ABNORMAL HIGH (ref 70–99)

## 2021-04-22 LAB — CBC
HCT: 45.3 % (ref 39.0–52.0)
Hemoglobin: 15 g/dL (ref 13.0–17.0)
MCH: 30 pg (ref 26.0–34.0)
MCHC: 33.1 g/dL (ref 30.0–36.0)
MCV: 90.6 fL (ref 80.0–100.0)
Platelets: 134 10*3/uL — ABNORMAL LOW (ref 150–400)
RBC: 5 MIL/uL (ref 4.22–5.81)
RDW: 13.9 % (ref 11.5–15.5)
WBC: 7 10*3/uL (ref 4.0–10.5)
nRBC: 0 % (ref 0.0–0.2)

## 2021-04-22 LAB — BASIC METABOLIC PANEL
Anion gap: 11 (ref 5–15)
BUN: 20 mg/dL (ref 6–20)
CO2: 23 mmol/L (ref 22–32)
Calcium: 8.1 mg/dL — ABNORMAL LOW (ref 8.9–10.3)
Chloride: 98 mmol/L (ref 98–111)
Creatinine, Ser: 1.54 mg/dL — ABNORMAL HIGH (ref 0.61–1.24)
GFR, Estimated: 52 mL/min — ABNORMAL LOW (ref >60)
Glucose, Bld: 252 mg/dL — ABNORMAL HIGH (ref 70–99)
Potassium: 3.6 mmol/L (ref 3.5–5.1)
Sodium: 132 mmol/L — ABNORMAL LOW (ref 135–145)

## 2021-04-22 LAB — HEMOGLOBIN A1C
Hgb A1c MFr Bld: 5.7 % — ABNORMAL HIGH (ref 4.8–5.6)
Mean Plasma Glucose: 116.89 mg/dL

## 2021-04-22 LAB — MAGNESIUM: Magnesium: 1.7 mg/dL (ref 1.7–2.4)

## 2021-04-22 LAB — COOXEMETRY PANEL
Carboxyhemoglobin: 1.2 % (ref 0.5–1.5)
Methemoglobin: 0.6 % (ref 0.0–1.5)
O2 Saturation: 59.8 %
Total hemoglobin: 15.9 g/dL (ref 12.0–16.0)

## 2021-04-22 LAB — HEPARIN LEVEL (UNFRACTIONATED)
Heparin Unfractionated: >1.1 IU/mL — ABNORMAL HIGH (ref 0.30–0.70)
Heparin Unfractionated: 0.66 IU/mL (ref 0.30–0.70)

## 2021-04-22 MED ORDER — DIGOXIN 125 MCG PO TABS
0.1250 mg | ORAL_TABLET | Freq: Every day | ORAL | Status: DC
  Administered 2021-04-22 – 2021-04-25 (×4): 0.125 mg via ORAL
  Filled 2021-04-22 (×4): qty 1

## 2021-04-22 MED ORDER — POTASSIUM CHLORIDE CRYS ER 20 MEQ PO TBCR
40.0000 meq | EXTENDED_RELEASE_TABLET | Freq: Once | ORAL | Status: AC
  Administered 2021-04-22: 40 meq via ORAL
  Filled 2021-04-22: qty 2

## 2021-04-22 MED ORDER — GUAIFENESIN-DM 100-10 MG/5ML PO SYRP
15.0000 mL | ORAL_SOLUTION | ORAL | Status: DC | PRN
  Administered 2021-04-22 – 2021-04-25 (×8): 15 mL via ORAL
  Filled 2021-04-22 (×8): qty 15

## 2021-04-22 MED ORDER — INSULIN ASPART 100 UNIT/ML IJ SOLN
0.0000 [IU] | Freq: Three times a day (TID) | INTRAMUSCULAR | Status: DC
  Administered 2021-04-22: 2 [IU] via SUBCUTANEOUS
  Administered 2021-04-22: 15 [IU] via SUBCUTANEOUS
  Administered 2021-04-23 – 2021-04-24 (×5): 3 [IU] via SUBCUTANEOUS
  Administered 2021-04-24: 17:00:00 5 [IU] via SUBCUTANEOUS
  Administered 2021-04-25: 17:00:00 2 [IU] via SUBCUTANEOUS
  Administered 2021-04-25 (×2): 3 [IU] via SUBCUTANEOUS
  Administered 2021-04-26: 18:00:00 8 [IU] via SUBCUTANEOUS
  Administered 2021-04-26: 08:00:00 3 [IU] via SUBCUTANEOUS

## 2021-04-22 MED ORDER — HYDROXYZINE HCL 25 MG PO TABS
25.0000 mg | ORAL_TABLET | Freq: Three times a day (TID) | ORAL | Status: DC | PRN
  Administered 2021-04-22 – 2021-04-23 (×2): 25 mg via ORAL
  Filled 2021-04-22 (×2): qty 1

## 2021-04-22 MED ORDER — ALUM & MAG HYDROXIDE-SIMETH 200-200-20 MG/5ML PO SUSP
15.0000 mL | ORAL | Status: DC | PRN
  Administered 2021-04-22: 15 mL via ORAL
  Filled 2021-04-22: qty 30

## 2021-04-22 MED ORDER — MAGNESIUM SULFATE 2 GM/50ML IV SOLN
2.0000 g | Freq: Once | INTRAVENOUS | Status: AC
  Administered 2021-04-22: 2 g via INTRAVENOUS
  Filled 2021-04-22: qty 50

## 2021-04-22 MED ORDER — FUROSEMIDE 10 MG/ML IJ SOLN
80.0000 mg | Freq: Two times a day (BID) | INTRAMUSCULAR | Status: DC
  Administered 2021-04-22: 80 mg via INTRAVENOUS
  Filled 2021-04-22: qty 8

## 2021-04-22 NOTE — Plan of Care (Signed)
Patient's BP dropped overnight to systolic's in the 01'T and Maps in the 50's. Levo restarted and currently on 5 mcg. Patient remained in alternate Heart rhythm of Afib /ST with occasional PVC's.  Problem: Education: Goal: Ability to demonstrate management of disease process will improve Outcome: Progressing Goal: Ability to verbalize understanding of medication therapies will improve Outcome: Progressing Goal: Individualized Educational Video(s) Outcome: Progressing   Problem: Activity: Goal: Capacity to carry out activities will improve Outcome: Progressing   Problem: Cardiac: Goal: Ability to achieve and maintain adequate cardiopulmonary perfusion will improve Outcome: Progressing
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
## 2021-04-22 NOTE — Progress Notes (Signed)
Patient ID: Victor [name], male   DOB: Jan 14, 1961, 60 y.o.   MRN: [id number]     Advanced Heart Failure Rounding Note  PCP-Cardiologist: Larae Grooms, MD  [facility]: Dr. [name]   Subjective:    12/14: Admitted w/ a/c CHF w/ low output and new Afib w/ RVR. Co-ox 38%. Placed on NE, milrinone and amio gtt.   Echo EF 20-25%, RV normal. Mod MR.  TEE-guided DCCV to NSR on 12/16, TEE with EF 20-25%, moderate to severe RV dysfunction, severe functional MR.    Patient is back in atrial fibrillation with RVR 120s today. Amiodarone gtt 30 mg/hr.   On Milrinone 0.25 + NE 5. Co-ox 60%. BP stable. CVP 10-11.   SCr stable, 1.70>>1.98>>1.52>>1.54   Anxious about prognosis.  Short of breath walking to bathroom.     Objective:   Weight Range: 125.5 kg Body mass index is 36.5 kg/m.   Vital Signs:   Temp:  [98 F (36.7 C)-98.6 F (37 C)] 98.4 F (36.9 C) (12/17 0800) Pulse Rate:  [81-196] 123 (12/17 0830) Resp:  [11-40] 19 (12/17 0830) BP: (80-129)/(45-113) 125/98 (12/17 0830) SpO2:  [88 %-97 %] 93 % (12/17 0830) Weight:  [125.5 kg] 125.5 kg (12/16 1239) Last BM Date: (S) 04/21/21  Weight change: Filed Weights   04/20/21 0600 04/21/21 0500 04/21/21 1239  Weight: 125.6 kg 125.5 kg 125.5 kg    Intake/Output:   Intake/Output Summary (Last 24 hours) at 04/22/2021 0852 Last data filed at 04/22/2021 0800 Gross per 24 hour  Intake 2247.14 ml  Output 1925 ml  Net 322.14 ml      Physical Exam    CVP 10-11 General: NAD Neck: JVP 10-12 cm, no thyromegaly or thyroid nodule.  Lungs: Clear to auscultation bilaterally with normal respiratory effort. CV: Lateral PMI.  Heart tachy, irregular S1/S2, no S3/S4, no murmur.  No peripheral edema.   Abdomen: Soft, nontender, no hepatosplenomegaly, no distention.  Skin: Intact without lesions or rashes.  Neurologic: Alert and oriented x 3.  Psych: Normal affect. Extremities: No clubbing or cyanosis.  HEENT: Normal.    Telemetry    Afib w/ RVR 120s, occasional PVCs. Personally reviewed   EKG    No new EKG to review   Labs    CBC Recent Labs    04/19/21 0954 04/20/21 0403 04/21/21 0434 04/22/21 0417  WBC 6.2   < > 8.1 7.0  NEUTROABS 3.4  --   --   --   HGB 19.2*   < > 17.0 15.0  HCT 59.1*   < > 50.3 45.3  MCV 91.3   < > 89.8 90.6  PLT 201   < > 159 134*   < > = values in this interval not displayed.   Basic Metabolic Panel Recent Labs    04/20/21 0534 04/21/21 0434 04/22/21 0417  NA 131* 133* 132*  K 3.4* 4.1 3.6  CL 97* 99 98  CO2 24 24 23   GLUCOSE 160* 200* 252*  BUN 27* 21* 20  CREATININE 1.98* 1.52* 1.54*  CALCIUM 8.7* 8.8* 8.1*  MG 2.0  --  1.7   Liver Function Tests Recent Labs    04/19/21 1101  AST 29  ALT 35  ALKPHOS 49  BILITOT 1.9*  PROT 8.0  ALBUMIN 4.4   No results for input(s): LIPASE, AMYLASE in the last 72 hours. Cardiac Enzymes No results for input(s): CKTOTAL, CKMB, CKMBINDEX, TROPONINI in the last 72 hours.  BNP: BNP (last 3 results) Recent Labs  01/23/21 1104 03/08/21 1605 04/19/21 0954  BNP 119.8* 390.0* 154.2*    ProBNP (last 3 results) No results for input(s): PROBNP in the last 8760 hours.   D-Dimer Recent Labs    04/19/21 1706  DDIMER 0.32   Hemoglobin A1C No results for input(s): HGBA1C in the last 72 hours. Fasting Lipid Panel No results for input(s): CHOL, HDL, LDLCALC, TRIG, CHOLHDL, LDLDIRECT in the last 72 hours. Thyroid Function Tests Recent Labs    04/19/21 1101  TSH 1.055    Other results:   Imaging    No results found.   Medications:     Scheduled Medications:  Chlorhexidine Gluconate Cloth  6 each Topical Daily   furosemide  80 mg Intravenous BID   mouth rinse  15 mL Mouth Rinse BID   pregabalin  150 mg Oral TID   sodium chloride flush  10-40 mL Intracatheter Q12H   sodium chloride flush  3 mL Intravenous Q12H    Infusions:  sodium chloride     sodium chloride     sodium chloride     amiodarone  30 mg/hr (04/22/21 0800)   heparin 1,750 Units/hr (04/22/21 0800)   magnesium sulfate bolus IVPB 2 g (04/22/21 0828)   milrinone 0.25 mcg/kg/min (04/22/21 0800)   norepinephrine (LEVOPHED) Adult infusion 5 mcg/min (04/22/21 0800)    PRN Medications: sodium chloride, Place/Maintain arterial line **AND** sodium chloride, acetaminophen, HYDROmorphone (DILAUDID) injection, phenol, sodium chloride flush, sodium chloride flush, traZODone   Assessment/Plan   1. Acute on Chronic Systolic Heart Failure>>Cardiogenic Shock  - diagnosed in 2014. Cath at that time normal cors with EF 20-25% - Echo 10/20 EF 20-25% RV ok. Moderate to severe MR - Cath 11/20 coronaries normal - Echo 7/21 EF 20-25% RV ok mild to mod MR  - Echo 01/23/21 EF 25% RV ok. Mild MR - CPX 06/2020 with overall moderate limitation due to CHF and body habitus.   - Now admitted w/ worsening symptoms, now NYHA Class III-IIIb and low output. In setting of new Afib w/ RVR. Initial Co-ox 38%>>Milrinone and NE added - Echo EF 20-25%, RV ok. Mod MR.  TEE EF 20-25%, moderate to severe RV dysfunction, severe functional MR.   - on Milrinone 0.25 + NE 5. Co-ox 60%. CVP 10-11 on po Lasix.  - TEE-guided DCCV to NSR 12/16 but back in atrial fibrillation with RVR today.  Suspect AF with RVR likely drove decompensation.   - Holding home Entresto/Farxiga w/ AKI and hypotension  - Holding ? blocker w/ hypotension and low output  - Has seen Dr. Caryl Comes for ICD +/- CRT but  was reluctant to proceed - Need to consider w/u for Advanced therapies, transplant vs VAD (probably transplant with age, RV dysfunction).  - May get improvement with NSR.  Will try to wean inotrope/pressor, load more amiodarone, and cardiovert again.  Decrease milrinone to 0.125 today and wean down NE with stable BP. Add digoxin 0.125.  Increase amiodarone gtt to 60 mg/hr.  - Mild volume overload and dyspnea, will resume Lasix 80 mg IV bid this evening (had po dose).      2. Atrial  Fibrillation w/ RVR - New. Duration unknown.  - TFTs normal  - Respiratory Panel negative  - C/w Amio gtt for rate control.  - C/w IV heparin for now until need for procedures ruled out  - TEE/DCCV to NSR 12/16 but back in AF with RVR 12/17.  - Keep K > 4.0 and Mg > 2.0  -  Sleep study 5/21>>PSG very mild OSA . No CPAP for now  - As above, increase amiodarone gtt to 60 mg/hr and wean down vasoactive meds.   - Aim for repeat DCCV likely Monday.    3. AKI - Baseline SCr ~1.1 - Progressive increase over the last month, 1.3>1.5>1.70>1.98>1.52>1.54 today  - suspect 2/2 cardiorenal/low output - inotropes + diuresis per above    4. Polycythemia - Sleep study not overly impressive. - Being followed at [facility] for polycythemia. Work-up including JAK2 mutation and BMBx have been negative.  - Plan for therapeutic phlebotomy if HCT >= 54 (50 today) - starting a/c given new Afib    5. Mitral Regurgitation  - Severe functional MR on TEE.  If he stabilizes out, would repeat echo in NSR and consider Mitraclip if remains severe.   6. NSVT - monitor w/ dual inotropes  - continue amio gtt - Keep K > 4.0 and Mg >2.0   7. Hypokalemia - supp w/ KCL w/diuresis   CRITICAL CARE Performed by: Loralie Champagne  Total critical care time: 35 minutes  Critical care time was exclusive of separately billable procedures and treating other patients.  Critical care was necessary to treat or prevent imminent or life-threatening deterioration.  Critical care was time spent personally by me (independent of midlevel providers or residents) on the following activities: development of treatment plan with patient and/or surrogate as well as nursing, discussions with consultants, evaluation of patient's response to treatment, examination of patient, obtaining history from patient or surrogate, ordering and performing treatments and interventions, ordering and review of laboratory studies, ordering and review of  radiographic studies, pulse oximetry and re-evaluation of patient's condition.  Loralie Champagne, MD  8:52 AM

## 2021-04-23 ENCOUNTER — Encounter (HOSPITAL_COMMUNITY): Payer: Self-pay | Admitting: Internal Medicine

## 2021-04-23 LAB — COMPREHENSIVE METABOLIC PANEL
ALT: 25 U/L (ref 0–44)
ALT: 30 U/L (ref 0–44)
AST: 20 U/L (ref 15–41)
AST: 26 U/L (ref 15–41)
Albumin: 3.2 g/dL — ABNORMAL LOW (ref 3.5–5.0)
Albumin: 3.6 g/dL (ref 3.5–5.0)
Alkaline Phosphatase: 34 U/L — ABNORMAL LOW (ref 38–126)
Alkaline Phosphatase: 41 U/L (ref 38–126)
Anion gap: 14 (ref 5–15)
Anion gap: 12 (ref 5–15)
BUN: 25 mg/dL — ABNORMAL HIGH (ref 6–20)
BUN: 29 mg/dL — ABNORMAL HIGH (ref 6–20)
CO2: 22 mmol/L (ref 22–32)
CO2: 22 mmol/L (ref 22–32)
Calcium: 7.3 mg/dL — ABNORMAL LOW (ref 8.9–10.3)
Calcium: 8.2 mg/dL — ABNORMAL LOW (ref 8.9–10.3)
Chloride: 85 mmol/L — ABNORMAL LOW (ref 98–111)
Chloride: 90 mmol/L — ABNORMAL LOW (ref 98–111)
Creatinine, Ser: 1.88 mg/dL — ABNORMAL HIGH (ref 0.61–1.24)
Creatinine, Ser: 2.09 mg/dL — ABNORMAL HIGH (ref 0.61–1.24)
GFR, Estimated: 41 mL/min — ABNORMAL LOW (ref >60)
GFR, Estimated: 36 mL/min — ABNORMAL LOW (ref >60)
Glucose, Bld: 484 mg/dL — ABNORMAL HIGH (ref 70–99)
Glucose, Bld: 338 mg/dL — ABNORMAL HIGH (ref 70–99)
Potassium: 2.9 mmol/L — ABNORMAL LOW (ref 3.5–5.1)
Potassium: 3.1 mmol/L — ABNORMAL LOW (ref 3.5–5.1)
Sodium: 121 mmol/L — ABNORMAL LOW (ref 135–145)
Sodium: 124 mmol/L — ABNORMAL LOW (ref 135–145)
Total Bilirubin: 1.6 mg/dL — ABNORMAL HIGH (ref 0.3–1.2)
Total Bilirubin: 2 mg/dL — ABNORMAL HIGH (ref 0.3–1.2)
Total Protein: 5.9 g/dL — ABNORMAL LOW (ref 6.5–8.1)
Total Protein: 6.6 g/dL (ref 6.5–8.1)

## 2021-04-23 LAB — COOXEMETRY PANEL
Carboxyhemoglobin: 0.8 % (ref 0.5–1.5)
Carboxyhemoglobin: 0.9 % (ref 0.5–1.5)
Carboxyhemoglobin: 1.3 % (ref 0.5–1.5)
Carboxyhemoglobin: 1 % (ref 0.5–1.5)
Carboxyhemoglobin: 1 % (ref 0.5–1.5)
Methemoglobin: 0.6 % (ref 0.0–1.5)
Methemoglobin: 0.6 % (ref 0.0–1.5)
Methemoglobin: 0.6 % (ref 0.0–1.5)
Methemoglobin: 0.7 % (ref 0.0–1.5)
Methemoglobin: 0.7 % (ref 0.0–1.5)
O2 Saturation: 45.8 %
O2 Saturation: 45.3 %
O2 Saturation: 92 %
O2 Saturation: 41.4 %
O2 Saturation: 48 %
Total hemoglobin: 17.6 g/dL — ABNORMAL HIGH (ref 12.0–16.0)
Total hemoglobin: 17.4 g/dL — ABNORMAL HIGH (ref 12.0–16.0)
Total hemoglobin: 16.8 g/dL — ABNORMAL HIGH (ref 12.0–16.0)
Total hemoglobin: 15.6 g/dL (ref 12.0–16.0)
Total hemoglobin: 15 g/dL (ref 12.0–16.0)

## 2021-04-23 LAB — CBC
HCT: 51.6 % (ref 39.0–52.0)
Hemoglobin: 17.4 g/dL — ABNORMAL HIGH (ref 13.0–17.0)
MCH: 30.4 pg (ref 26.0–34.0)
MCHC: 33.7 g/dL (ref 30.0–36.0)
MCV: 90.1 fL (ref 80.0–100.0)
Platelets: 153 10*3/uL (ref 150–400)
RBC: 5.73 MIL/uL (ref 4.22–5.81)
RDW: 13.7 % (ref 11.5–15.5)
WBC: 11 10*3/uL — ABNORMAL HIGH (ref 4.0–10.5)
nRBC: 0 % (ref 0.0–0.2)

## 2021-04-23 LAB — BASIC METABOLIC PANEL
Anion gap: 11 (ref 5–15)
BUN: 22 mg/dL — ABNORMAL HIGH (ref 6–20)
CO2: 23 mmol/L (ref 22–32)
Calcium: 9.1 mg/dL (ref 8.9–10.3)
Chloride: 101 mmol/L (ref 98–111)
Creatinine, Ser: 2.05 mg/dL — ABNORMAL HIGH (ref 0.61–1.24)
GFR, Estimated: 37 mL/min — ABNORMAL LOW (ref >60)
Glucose, Bld: 143 mg/dL — ABNORMAL HIGH (ref 70–99)
Potassium: 4.5 mmol/L (ref 3.5–5.1)
Sodium: 135 mmol/L (ref 135–145)

## 2021-04-23 LAB — LACTIC ACID, PLASMA: Lactic Acid, Venous: 1.1 mmol/L (ref 0.5–1.9)

## 2021-04-23 LAB — MAGNESIUM
Magnesium: 2 mg/dL (ref 1.7–2.4)
Magnesium: 1.6 mg/dL — ABNORMAL LOW (ref 1.7–2.4)

## 2021-04-23 LAB — HEPARIN LEVEL (UNFRACTIONATED): Heparin Unfractionated: 0.5 IU/mL (ref 0.30–0.70)

## 2021-04-23 LAB — GLUCOSE, CAPILLARY
Glucose-Capillary: 160 mg/dL — ABNORMAL HIGH (ref 70–99)
Glucose-Capillary: 181 mg/dL — ABNORMAL HIGH (ref 70–99)
Glucose-Capillary: 167 mg/dL — ABNORMAL HIGH (ref 70–99)
Glucose-Capillary: 151 mg/dL — ABNORMAL HIGH (ref 70–99)

## 2021-04-23 MED ORDER — ALTEPLASE 2 MG IJ SOLR: 2.0000 mg | Freq: Once | INTRAMUSCULAR | Status: DC

## 2021-04-23 MED ORDER — ALPRAZOLAM 0.5 MG PO TABS
0.5000 mg | ORAL_TABLET | Freq: Three times a day (TID) | ORAL | Status: DC | PRN
  Administered 2021-04-23: 08:00:00 0.5 mg via ORAL
  Filled 2021-04-23: qty 1

## 2021-04-23 MED ORDER — NOREPINEPHRINE 4 MG/250ML-% IV SOLN
3.0000 ug/min | INTRAVENOUS | Status: DC
  Administered 2021-04-23 – 2021-04-25 (×3): 3 ug/min via INTRAVENOUS
  Filled 2021-04-23 (×5): qty 250

## 2021-04-23 MED ORDER — DEXTROSE 5 % IV SOLN
120.0000 mg | Freq: Once | INTRAVENOUS | Status: AC
  Administered 2021-04-23: 22:00:00 120 mg via INTRAVENOUS
  Filled 2021-04-23: qty 10

## 2021-04-23 MED ORDER — FUROSEMIDE 10 MG/ML IJ SOLN
20.0000 mg/h | INTRAVENOUS | Status: DC
  Administered 2021-04-23: 22:00:00 20 mg/h via INTRAVENOUS
  Administered 2021-04-23: 08:00:00 10 mg/h via INTRAVENOUS
  Administered 2021-04-24 – 2021-04-26 (×6): 20 mg/h via INTRAVENOUS
  Filled 2021-04-23 (×11): qty 20

## 2021-04-23 MED ORDER — ALPRAZOLAM 0.5 MG PO TABS
0.5000 mg | ORAL_TABLET | Freq: Three times a day (TID) | ORAL | Status: DC | PRN
Start: 2021-04-23 — End: 2021-04-26
  Administered 2021-04-23 – 2021-04-25 (×3): 1 mg via ORAL
  Filled 2021-04-23 (×4): qty 2

## 2021-04-23 MED ORDER — POTASSIUM CHLORIDE CRYS ER 20 MEQ PO TBCR
40.0000 meq | EXTENDED_RELEASE_TABLET | Freq: Once | ORAL | Status: AC
  Administered 2021-04-23: 23:00:00 40 meq via ORAL
  Filled 2021-04-23: qty 2

## 2021-04-23 MED ORDER — MAGNESIUM SULFATE 2 GM/50ML IV SOLN
2.0000 g | Freq: Once | INTRAVENOUS | Status: AC
  Administered 2021-04-23: 23:00:00 2 g via INTRAVENOUS
  Filled 2021-04-23: qty 50

## 2021-04-23 MED ORDER — ALPRAZOLAM 0.5 MG PO TABS
0.5000 mg | ORAL_TABLET | Freq: Once | ORAL | Status: AC
  Administered 2021-04-23: 10:00:00 0.5 mg via ORAL
  Filled 2021-04-23: qty 1

## 2021-04-23 MED ORDER — AMIODARONE IV BOLUS ONLY 150 MG/100ML
150.0000 mg | Freq: Once | INTRAVENOUS | Status: AC
  Administered 2021-04-23: 08:00:00 150 mg via INTRAVENOUS
  Filled 2021-04-23: qty 100

## 2021-04-23 MED ORDER — FUROSEMIDE 10 MG/ML IJ SOLN
80.0000 mg | Freq: Once | INTRAMUSCULAR | Status: AC
  Administered 2021-04-23: 08:00:00 80 mg via INTRAVENOUS
  Filled 2021-04-23: qty 8

## 2021-04-23 NOTE — Progress Notes (Addendum)
Paged regarding Co-ox (41.4) at 17:28, prior (45.3) at 09:15. Was averaging 60-64 the 2 days  prior.  VS: AF/RVR (HR 115), BP 92/72 (79) on BP cuff, RR 32, SpO2 91% on RA.  IO: in 1500, out 850, net positive 650 ml.   Lactic acid last checked 04/19/21 (1.4). Initially (1.8) then increased to (2.0) before back to (1.4).  He was admitted 04/19/21 with low output HF with AF/RVR.   Amio load: 12/15 and 12/16 at 30 mg/h. On 12/17 increased to 60 mg/h at 0700 and continues on this rate currently. He is talking in full sentences and with family. Explained the lab work and that we are checking repeat stat labs now (CMP, Co-Ox, lactate) off PICC. Expect that he will have worsening AKI/LFTs and same co ox as earlier. Will give lasix 120 mg IV now and increase gtt to 20 mg/h from 10 mg/h. May need to cardiovert tonight and may need to add back on levophed (stopped 12/17 AM, running 3-5 mcg/min). Small amount of dinner an hour ago. Will f/u labs and make plan from there. Subjectively he is feeling significantly improved from yesterday in terms of SOB. He is warm on exam on milrinone 0.25 mcg/kg/min.   Discussed with Dr. [name] as he only stayed in NSR for a few hours 2 days ago. Although he is ~5 gm additional amio PO equivalents since then he thought low success for staying in SR while in shock. Will start back on levophed 3 mcg/min. Plan potentially for impella tomorrow as he is going to need more support with declining UOP while on milrinone.

## 2021-04-23 NOTE — Progress Notes (Addendum)
Spoke with Dr. [name] via secure chat and made MD aware that since giving one time dose of xanax 0.5 mg, in addition to initial PRN 0.5 mg dose, that patient has been resting and is much calmer than this morning. Informed MD that patient stated he takes 0.5-1 mg of xanax at home depending on how bad his anxiety becomes. MD gave order to change PRN xanax order to 0.5-1 mg PRN for anxiety TID.

## 2021-04-23 NOTE — Progress Notes (Addendum)
Patient ID: Victor [name], male   DOB: [birth year], 60 y.o.   MRN: [id number]     Advanced Heart Failure Rounding Note  PCP-Cardiologist: Larae Grooms, MD  [facility]: Dr. [name]   Subjective:    12/14: Admitted w/ a/c CHF w/ low output and new Afib w/ RVR. Co-ox 38%. Placed on NE, milrinone and amio gtt.   Echo EF 20-25%, RV normal. Mod MR.  TEE-guided DCCV to NSR on 12/16, TEE with EF 20-25%, moderate to severe RV dysfunction, severe functional MR.    Patient is back in atrial fibrillation with RVR 120s today. He is on amiodarone 60 mg/hr + digoxin.    Very anxious and orthopneic.  On Milrinone 0.125 and off norepinephrine.  However, co-ox down to 46% and creatinine up to 2.05.  Some diuresis yesterday but not marked.  CVP 14 today.   SCr stable, 1.70>>1.98>>1.52>>1.54>>2.05  Objective:   Weight Range: 125.5 kg Body mass index is 36.5 kg/m.   Vital Signs:   Temp:  [97.8 F (36.6 C)-99.2 F (37.3 C)] 98.2 F (36.8 C) (12/18 0347) Pulse Rate:  [88-222] 113 (12/18 0600) Resp:  [17-42] 32 (12/18 0600) BP: (99-148)/(60-113) 119/96 (12/18 0600) SpO2:  [86 %-97 %] 86 % (12/18 0600) Weight:  [125.5 kg] 125.5 kg (12/18 0500) Last BM Date: 04/22/21  Weight change: Filed Weights   04/21/21 0500 04/21/21 1239 04/23/21 0500  Weight: 125.5 kg 125.5 kg 125.5 kg    Intake/Output:   Intake/Output Summary (Last 24 hours) at 04/23/2021 0745 Last data filed at 04/23/2021 0405 Gross per 24 hour  Intake 1600.32 ml  Output 1800 ml  Net -199.68 ml      Physical Exam    CVP 14 General: orthopneic Neck: JVP 12-14 cm, no thyromegaly or thyroid nodule.  Lungs: Clear to auscultation bilaterally with normal respiratory effort. CV: Nondisplaced PMI.  Heart tachy,irregular S1/S2, no S3/S4, 2/6 HSM apex.   No peripheral edema.   Abdomen: Soft, nontender, no hepatosplenomegaly, no distention.  Skin: Intact without lesions or rashes.  Neurologic: Alert and oriented x 3.  Psych:  Anxious Extremities: No clubbing or cyanosis.  HEENT: Normal.   Telemetry   Afib w/ RVR 110s-120s, occasional PVCs. Personally reviewed   EKG    No new EKG to review   Labs    CBC Recent Labs    04/22/21 0417 04/23/21 0213  WBC 7.0 11.0*  HGB 15.0 17.4*  HCT 45.3 51.6  MCV 90.6 90.1  PLT 134* 053   Basic Metabolic Panel Recent Labs    04/22/21 0417 04/23/21 0213  NA 132* 135  K 3.6 4.5  CL 98 101  CO2 23 23  GLUCOSE 252* 143*  BUN 20 22*  CREATININE 1.54* 2.05*  CALCIUM 8.1* 9.1  MG 1.7 2.0   Liver Function Tests No results for input(s): AST, ALT, ALKPHOS, BILITOT, PROT, ALBUMIN in the last 72 hours.  No results for input(s): LIPASE, AMYLASE in the last 72 hours. Cardiac Enzymes No results for input(s): CKTOTAL, CKMB, CKMBINDEX, TROPONINI in the last 72 hours.  BNP: BNP (last 3 results) Recent Labs    01/23/21 1104 03/08/21 1605 04/19/21 0954  BNP 119.8* 390.0* 154.2*    ProBNP (last 3 results) No results for input(s): PROBNP in the last 8760 hours.   D-Dimer No results for input(s): DDIMER in the last 72 hours.  Hemoglobin A1C Recent Labs    04/22/21 1102  HGBA1C 5.7*   Fasting Lipid Panel No results for input(s): CHOL, HDL,  LDLCALC, TRIG, CHOLHDL, LDLDIRECT in the last 72 hours. Thyroid Function Tests No results for input(s): TSH, T4TOTAL, T3FREE, THYROIDAB in the last 72 hours.  Invalid input(s): FREET3   Other results:   Imaging    No results found.   Medications:     Scheduled Medications:  Chlorhexidine Gluconate Cloth  6 each Topical Daily   digoxin  0.125 mg Oral Daily   furosemide  80 mg Intravenous Once   insulin aspart  0-15 Units Subcutaneous TID WC   mouth rinse  15 mL Mouth Rinse BID   pregabalin  150 mg Oral TID   sodium chloride flush  10-40 mL Intracatheter Q12H   sodium chloride flush  3 mL Intravenous Q12H    Infusions:  sodium chloride     sodium chloride     sodium chloride     amiodarone 60  mg/hr (04/23/21 0405)   amiodarone     furosemide (LASIX) 200 mg in dextrose 5% 100 mL (2mg /mL) infusion     heparin 1,750 Units/hr (04/23/21 0405)   milrinone 0.125 mcg/kg/min (04/23/21 0405)   norepinephrine (LEVOPHED) Adult infusion 3 mcg/min (04/22/21 0847)    PRN Medications: sodium chloride, Place/Maintain arterial line **AND** sodium chloride, acetaminophen, ALPRAZolam, alum & mag hydroxide-simeth, guaiFENesin-dextromethorphan, HYDROmorphone (DILAUDID) injection, hydrOXYzine, phenol, sodium chloride flush, sodium chloride flush, traZODone   Assessment/Plan   1. Acute on Chronic Systolic Heart Failure>>Cardiogenic Shock  - diagnosed in 2014. Cath at that time normal cors with EF 20-25% - Echo 10/20 EF 20-25% RV ok. Moderate to severe MR - Cath 11/20 coronaries normal - Echo 7/21 EF 20-25% RV ok mild to mod MR  - Echo 01/23/21 EF 25% RV ok. Mild MR - CPX 06/2020 with overall moderate limitation due to CHF and body habitus.   - Now admitted w/ worsening symptoms, now NYHA Class III-IIIb and low output. In setting of new Afib w/ RVR. Initial Co-ox 38%>>Milrinone and NE added - Echo EF 20-25%, RV ok. Mod MR.  TEE EF 20-25%, moderate to severe RV dysfunction, severe functional MR.   - on Milrinone 0.25 + NE 5. Co-ox 60%. CVP 10-11 on po Lasix.  - TEE-guided DCCV to NSR 12/16 but back in atrial fibrillation with RVR shortly afterwards.  Suspect AF with RVR likely drove decompensation.   - Holding home Entresto/Farxiga w/ AKI and hypotension  - Holding ? blocker w/ hypotension and low output  - Has seen Dr. Caryl Comes for ICD +/- CRT but  was reluctant to proceed - Need to consider w/u for Advanced therapies, transplant vs VAD (probably transplant with age, RV dysfunction).  - May get improvement with NSR (decompensation may be tachy-mediated).  Have wanted to wean inotrope/pressor, load more amiodarone, and cardiovert again.  He is off norepinephrine with stable BP, but with drop in milrinone  to 0.125, co-ox down to 46%.  With rise in creatinine and dyspnea, I will increase milrinone back to 0.25 and repeat co-ox.  - CVP 14 with significant orthopnea, Lasix 80 mg IV x 1 this morning and then Lasix gtt 10 mg/hr.   - May need to consider Impella 5.5 placement to allow weaning of milrinone for DCCV, I am concerned that he will not tolerate milrinone wean at this point and therefore will not be able to successfully cardiovert.        2. Atrial Fibrillation w/ RVR - New. Duration unknown.  - TFTs normal  - Respiratory Panel negative  - C/w IV heparin for now until  need for procedures ruled out  - TEE/DCCV to NSR 12/16 but back in AF with RVR 12/17, still in RVR this morning.  - Keep K > 4.0 and Mg > 2.0  - Sleep study 5/21>>PSG very mild OSA . No CPAP for now  - Continue amiodarone gtt 60 mg/hr this morning and will give bolus.  - As above, had wanted to wean vasoactive gtts and cardiovert again, but need to go back up on milrinone.  Will keep NPO after midnight for re-attempted cardioversion, but may need to consider Impella 5.5 placement to allow inotrope wean. Discussed with Dr. Cyndia Bent who will not be here tomorrow (will be Dr. Roxan Hockey or Dr. Kipp Brood).    3. AKI - Baseline SCr ~1.1 - Progressive increase over the last month, 1.3>1.5>1.70>1.98>1.52>1.54>2 today  - suspect 2/2 cardiorenal/low output - Increase milrinone back to 0.25 as above with low co-ox, MAP is stable.   - Will need diuresis as above, watch creatinine trend closely.    4. Polycythemia - Sleep study not overly impressive. - Being followed at [facility] for polycythemia. Work-up including JAK2 mutation and BMBx have been negative.  - Plan for therapeutic phlebotomy if HCT >= 54 (50 today) - starting a/c given new Afib    5. Mitral Regurgitation  - Severe functional MR on TEE.  If he stabilizes out, would repeat echo in NSR and consider Mitraclip if remains severe.   6. NSVT - monitor w/ dual  inotropes  - continue amio gtt - Keep K > 4.0 and Mg >2.0   7. Hypokalemia - supp w/ KCL w/diuresis   8. Anxiety - Significant, add Xanax.   CRITICAL CARE Performed by: Loralie Champagne  Total critical care time: 35 minutes  Critical care time was exclusive of separately billable procedures and treating other patients.  Critical care was necessary to treat or prevent imminent or life-threatening deterioration.  Critical care was time spent personally by me (independent of midlevel providers or residents) on the following activities: development of treatment plan with patient and/or surrogate as well as nursing, discussions with consultants, evaluation of patient's response to treatment, examination of patient, obtaining history from patient or surrogate, ordering and performing treatments and interventions, ordering and review of laboratory studies, ordering and review of radiographic studies, pulse oximetry and re-evaluation of patient's condition.  Loralie Champagne, MD  7:45 AM

## 2021-04-23 NOTE — Progress Notes (Signed)
Spoke with Dr. [name] and made MD aware that patient is still anxious and restless after PRN dose of xanax was given. MD gave order for a one time dose of 0.5 mg xanax oral.

## 2021-04-23 NOTE — Progress Notes (Signed)
Paged Dr. Rudi Rummage about patient's continued anxiety t/o the night. Explained that patient takes alprazolam at home. Orders received to give hydroxyzine again. Labs drawn and resulted. HR 121 afib rhythm. O2 92 % on room air. BP 123/79. Lungs sound clear. CVP 11-14. Will continue to monitor.

## 2021-04-23 NOTE — Progress Notes (Signed)
Dr. Renella Cunas present and RN made MD aware of Co oximetry result of 41.4, MD acknowledged and gave no new orders. MD reviewing patient's chart.
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
## 2021-04-23 NOTE — Progress Notes (Signed)
Consult from [place], [place] reporting difficulty drawing labs. Grey and red lumen with brisk blood return after applying new caps and flushing lines. Recommended RN change in-line sampling set.

## 2021-04-24 ENCOUNTER — Inpatient Hospital Stay (HOSPITAL_COMMUNITY): Payer: Medicare Other | Admitting: Anesthesiology

## 2021-04-24 ENCOUNTER — Encounter (HOSPITAL_COMMUNITY): Payer: Self-pay | Admitting: Internal Medicine

## 2021-04-24 ENCOUNTER — Encounter (HOSPITAL_COMMUNITY)
Admission: EM | Disposition: A | Discharge status: Other Acute Inpatient Hospital | Payer: Self-pay | Source: Home / Self Care | Attending: Internal Medicine

## 2021-04-24 ENCOUNTER — Inpatient Hospital Stay (HOSPITAL_COMMUNITY): Payer: Medicare Other

## 2021-04-24 DIAGNOSIS — I509 Heart failure, unspecified: Secondary | ICD-10-CM

## 2021-04-24 DIAGNOSIS — R570 Cardiogenic shock: Secondary | ICD-10-CM

## 2021-04-24 DIAGNOSIS — I5023 Acute on chronic systolic (congestive) heart failure: Secondary | ICD-10-CM

## 2021-04-24 DIAGNOSIS — I428 Other cardiomyopathies: Secondary | ICD-10-CM

## 2021-04-24 HISTORY — PX: CARDIOVERSION: SHX1299

## 2021-04-24 LAB — CBC
HCT: 44.9 % (ref 39.0–52.0)
Hemoglobin: 15 g/dL (ref 13.0–17.0)
MCH: 30.2 pg (ref 26.0–34.0)
MCHC: 33.4 g/dL (ref 30.0–36.0)
MCV: 90.3 fL (ref 80.0–100.0)
Platelets: 132 10*3/uL — ABNORMAL LOW (ref 150–400)
RBC: 4.97 MIL/uL (ref 4.22–5.81)
RDW: 13.7 % (ref 11.5–15.5)
WBC: 9.9 10*3/uL (ref 4.0–10.5)
nRBC: 0 % (ref 0.0–0.2)

## 2021-04-24 LAB — COOXEMETRY PANEL
Carboxyhemoglobin: 1.2 % (ref 0.5–1.5)
Methemoglobin: 0.7 % (ref 0.0–1.5)
O2 Saturation: 61.9 %
Total hemoglobin: 14.6 g/dL (ref 12.0–16.0)

## 2021-04-24 LAB — GLUCOSE, CAPILLARY
Glucose-Capillary: 170 mg/dL — ABNORMAL HIGH (ref 70–99)
Glucose-Capillary: 151 mg/dL — ABNORMAL HIGH (ref 70–99)
Glucose-Capillary: 153 mg/dL — ABNORMAL HIGH (ref 70–99)
Glucose-Capillary: 208 mg/dL — ABNORMAL HIGH (ref 70–99)
Glucose-Capillary: 158 mg/dL — ABNORMAL HIGH (ref 70–99)

## 2021-04-24 LAB — BASIC METABOLIC PANEL
Anion gap: 14 (ref 5–15)
BUN: 25 mg/dL — ABNORMAL HIGH (ref 6–20)
CO2: 23 mmol/L (ref 22–32)
Calcium: 7.5 mg/dL — ABNORMAL LOW (ref 8.9–10.3)
Chloride: 88 mmol/L — ABNORMAL LOW (ref 98–111)
Creatinine, Ser: 1.9 mg/dL — ABNORMAL HIGH (ref 0.61–1.24)
GFR, Estimated: 40 mL/min — ABNORMAL LOW (ref >60)
Glucose, Bld: 443 mg/dL — ABNORMAL HIGH (ref 70–99)
Potassium: 3.2 mmol/L — ABNORMAL LOW (ref 3.5–5.1)
Sodium: 125 mmol/L — ABNORMAL LOW (ref 135–145)

## 2021-04-24 LAB — HEPARIN LEVEL (UNFRACTIONATED)
Heparin Unfractionated: >1.1 IU/mL — ABNORMAL HIGH (ref 0.30–0.70)
Heparin Unfractionated: 0.3 IU/mL (ref 0.30–0.70)

## 2021-04-24 LAB — LACTIC ACID, PLASMA: Lactic Acid, Venous: 1 mmol/L (ref 0.5–1.9)

## 2021-04-24 LAB — MAGNESIUM: Magnesium: 1.8 mg/dL (ref 1.7–2.4)

## 2021-04-24 SURGERY — CARDIOVERSION
Anesthesia: General

## 2021-04-24 MED ORDER — POTASSIUM CHLORIDE 10 MEQ/50ML IV SOLN
10.0000 meq | INTRAVENOUS | Status: AC
  Administered 2021-04-24 (×4): 10 meq via INTRAVENOUS
  Filled 2021-04-24 (×4): qty 50

## 2021-04-24 MED ORDER — POTASSIUM CHLORIDE CRYS ER 20 MEQ PO TBCR
40.0000 meq | EXTENDED_RELEASE_TABLET | Freq: Once | ORAL | Status: AC
  Administered 2021-04-24: 11:00:00 40 meq via ORAL
  Filled 2021-04-24: qty 2

## 2021-04-24 MED ORDER — POTASSIUM CHLORIDE CRYS ER 20 MEQ PO TBCR: 20.0000 meq | EXTENDED_RELEASE_TABLET | Freq: Two times a day (BID) | ORAL | Status: DC

## 2021-04-24 MED ORDER — POTASSIUM CHLORIDE CRYS ER 20 MEQ PO TBCR
20.0000 meq | EXTENDED_RELEASE_TABLET | Freq: Once | ORAL | Status: DC
  Filled 2021-04-24: qty 1

## 2021-04-24 MED ORDER — METOLAZONE 2.5 MG PO TABS
2.5000 mg | ORAL_TABLET | Freq: Once | ORAL | Status: AC
  Administered 2021-04-24: 12:00:00 2.5 mg via ORAL
  Filled 2021-04-24: qty 1

## 2021-04-24 MED ORDER — SODIUM CHLORIDE 0.9 % IV SOLN
INTRAVENOUS | Status: AC | PRN
  Administered 2021-04-24: 500 mL via INTRAVENOUS

## 2021-04-24 MED ORDER — MAGNESIUM SULFATE 2 GM/50ML IV SOLN
2.0000 g | Freq: Once | INTRAVENOUS | Status: AC
  Administered 2021-04-24: 05:00:00 2 g via INTRAVENOUS
  Filled 2021-04-24: qty 50

## 2021-04-24 MED ORDER — SODIUM CHLORIDE 0.9 % IV SOLN: INTRAVENOUS | Status: DC

## 2021-04-24 MED ORDER — ETOMIDATE 2 MG/ML IV SOLN
INTRAVENOUS | Status: DC | PRN
  Administered 2021-04-24: 20 mg via INTRAVENOUS

## 2021-04-24 NOTE — Progress Notes (Addendum)
Patient ID: Victor [name], male   DOB: [birth year], 60 y.o.   MRN: [id number]     Advanced Heart Failure Rounding Note  PCP-Cardiologist: Larae Grooms, MD  [facility]: Dr. [name]   Subjective:    12/14: Admitted w/ a/c CHF w/ low output and new Afib w/ RVR. Co-ox 38%. Placed on NE, milrinone and amio gtt.   Echo EF 20-25%, RV normal. Mod MR.  TEE-guided DCCV to NSR on 12/16, TEE with EF 20-25%, moderate to severe RV dysfunction, severe functional MR.    Went back into AF over the weekend with recurrent shock and co-ox in the 78s. Now on milrinone 0.25, NE 3. Lasix gtt at 20 and amio gtt.  CVP 20  SCr slightly improved 2.1 -> 1.9. Co-ox 62% Sodium 125  Mildly SOB. + Orthopnea   Objective:   Weight Range: 125.9 kg Body mass index is 36.62 kg/m.   Vital Signs:   Temp:  [97.8 F (36.6 C)-101 F (38.3 C)] 98.2 F (36.8 C) (12/19 0625) Pulse Rate:  [87-224] 108 (12/19 0745) Resp:  [17-46] 37 (12/19 0745) BP: (74-174)/(38-138) 103/87 (12/19 0745) SpO2:  [78 %-95 %] 91 % (12/19 0745) Weight:  [125.9 kg] 125.9 kg (12/19 0500) Last BM Date: 04/22/21  Weight change: Filed Weights   04/21/21 1239 04/23/21 0500 04/24/21 0500  Weight: 125.5 kg 125.5 kg 125.9 kg    Intake/Output:   Intake/Output Summary (Last 24 hours) at 04/24/2021 0815 Last data filed at 04/24/2021 0700 Gross per 24 hour  Intake 2493.29 ml  Output 2450 ml  Net 43.29 ml       Physical Exam    CVP 20 General:  Sitting up in chair. No resp difficulty HEENT: normal Neck: supple. JVP to jaw Carotids 2+ bilat; no bruits. No lymphadenopathy or thryomegaly appreciated. Cor: PMI nondisplaced. Irregular rate & rhythm.+s3. Lungs: clear Abdomen: soft, nontender, nondistended. No hepatosplenomegaly. No bruits or masses. Good bowel sounds. Extremities: no cyanosis, clubbing, rash, 1+ edema. Scarring from previous LLE wound Neuro: alert & orientedx3, cranial nerves grossly intact. moves all 4 extremities w/o  difficulty. Affect pleasant   Telemetry   Afib w/ RVR 100-110, occasional PVCs. Personally reviewed  Labs    CBC Recent Labs    04/23/21 0213 04/24/21 0300  WBC 11.0* 9.9  HGB 17.4* 15.0  HCT 51.6 44.9  MCV 90.1 90.3  PLT 153 132*    Basic Metabolic Panel Recent Labs    04/23/21 2136 04/24/21 0300  NA 124* 125*  K 3.1* 3.2*  CL 90* 88*  CO2 22 23  GLUCOSE 338* 443*  BUN 29* 25*  CREATININE 2.09* 1.90*  CALCIUM 8.2* 7.5*  MG 1.6* 1.8    Liver Function Tests Recent Labs    04/23/21 1958 04/23/21 2136  AST 20 26  ALT 25 30  ALKPHOS 34* 41  BILITOT 1.6* 2.0*  PROT 5.9* 6.6  ALBUMIN 3.2* 3.6    No results for input(s): LIPASE, AMYLASE in the last 72 hours. Cardiac Enzymes No results for input(s): CKTOTAL, CKMB, CKMBINDEX, TROPONINI in the last 72 hours.  BNP: BNP (last 3 results) Recent Labs    01/23/21 1104 03/08/21 1605 04/19/21 0954  BNP 119.8* 390.0* 154.2*     ProBNP (last 3 results) No results for input(s): PROBNP in the last 8760 hours.   D-Dimer No results for input(s): DDIMER in the last 72 hours.  Hemoglobin A1C Recent Labs    04/22/21 1102  HGBA1C 5.7*    Fasting Lipid Panel  No results for input(s): CHOL, HDL, LDLCALC, TRIG, CHOLHDL, LDLDIRECT in the last 72 hours. Thyroid Function Tests No results for input(s): TSH, T4TOTAL, T3FREE, THYROIDAB in the last 72 hours.  Invalid input(s): FREET3   Other results:   Imaging    No results found.   Medications:     Scheduled Medications:  alteplase  2 mg Intracatheter Once   Chlorhexidine Gluconate Cloth  6 each Topical Daily   digoxin  0.125 mg Oral Daily   insulin aspart  0-15 Units Subcutaneous TID WC   mouth rinse  15 mL Mouth Rinse BID   potassium chloride  20 mEq Oral Once   pregabalin  150 mg Oral TID   sodium chloride flush  10-40 mL Intracatheter Q12H   sodium chloride flush  3 mL Intravenous Q12H    Infusions:  sodium chloride     sodium chloride      sodium chloride     amiodarone 60 mg/hr (04/24/21 0600)   furosemide (LASIX) 200 mg in dextrose 5% 100 mL (2mg /mL) infusion 20 mg/hr (04/24/21 9373)   heparin 1,750 Units/hr (04/24/21 0701)   milrinone 0.25 mcg/kg/min (04/24/21 0635)   norepinephrine (LEVOPHED) Adult infusion 3 mcg/min (04/24/21 0600)   potassium chloride 10 mEq (04/24/21 0755)    PRN Medications: sodium chloride, Place/Maintain arterial line **AND** sodium chloride, acetaminophen, ALPRAZolam, alum & mag hydroxide-simeth, guaiFENesin-dextromethorphan, HYDROmorphone (DILAUDID) injection, hydrOXYzine, phenol, sodium chloride flush, sodium chloride flush, traZODone   Assessment/Plan   1. Acute on Chronic Systolic Heart Failure>>Cardiogenic Shock  - diagnosed in 2014. Cath at that time normal cors with EF 20-25% - Echo 10/20 EF 20-25% RV ok. Moderate to severe MR - Cath 11/20 coronaries normal - Echo 01/23/21 EF 25% RV ok. Mild MR - CPX 06/2020 with overall moderate limitation due to CHF and body habitus.  pVO 14.2 slope 30 - Admitted w/ worsening symptoms,NYHA IV and low output in setting of new Afib w/ RVR. Initial Co-ox 38%>>Milrinone and NE added - Echo EF 20-25%, RV ok. Mod MR.  TEE EF 20-25%, moderate to severe RV dysfunction, severe functional MR.   - on Milrinone 0.25 + NE 3 Co-ox 62%. CVP 13 - TEE-guided DCCV to NSR 12/16 but back in atrial fibrillation with RVR shortly afterwards.   - Now on lasix gtt at 20 and amio gtt. Give metolazone 2.5 x 1 - Holding home Entresto/Farxiga w/ AKI and hypotension  - Holding ? blocker w/ hypotension and low output  - Has seen Dr. Caryl Comes for ICD +/- CRT but  was reluctant to proceed - Need to consider w/u for Advanced therapies, transplant vs VAD (probably transplant with age, RV dysfunction).  - May get improvement with NSR (decompensation may be tachy-mediated).  Have wanted to wean inotrope/pressor, load more amiodarone, and cardiovert again.  Failed inotrope wean over  weekend - Continue IV lasix - I d/w [facility] transplant team. Suspect leg will not disqualify him. Plan Impella 5.5 tomorrow or Wednesday with bridge to transplant D/w him and his family. VAD team to see today.    2. Atrial Fibrillation w/ RVR - New. Duration unknown.  - TFTs normal. Respiratory Panel negative  - C/w IV heparin for now until need for procedures ruled out  - TEE/DCCV to NSR 12/16 but back in AF with RVR 12/17, still in RVR this morning.  - Keep K > 4.0 and Mg > 2.0  - Sleep study 5/21>>PSG very mild OSA . No CPAP for now  - Continue amiodarone  gtt 60 mg/hr this morning. Plan repeat DC-CV ths aa - As above, will attempt repeat DC-CV today but doubt will hold.   3. AKI - Baseline SCr ~1.1-1.3 - SCr 2.1 -> 1.9 - suspect 2/2 cardiorenal/low output - support hemodynamics   4. Polycythemia - Sleep study not overly impressive. - Being followed at [facility] for polycythemia. Work-up including JAK2 mutation and BMBx have been negative.  - Plan for therapeutic phlebotomy if HCT >= 54 (50 today) - starting a/c given new Afib    5. Mitral Regurgitation  - Severe functional MR on TEE.  If he stabilizes out, would repeat echo in NSR and consider Mitraclip if remains severe.   6. NSVT - monitor w/ dual inotropes  - continue amio gtt - Keep K > 4.0 and Mg >2.0   7. Hypokalemia - supp w/ KCL w/diuresis   8. Anxiety - Significant, on Xanax.   CRITICAL CARE Performed by: Glori Bickers  Total critical care time: 55 minutes  Critical care time was exclusive of separately billable procedures and treating other patients.  Critical care was necessary to treat or prevent imminent or life-threatening deterioration.  Critical care was time spent personally by me (independent of midlevel providers or residents) on the following activities: development of treatment plan with patient and/or surrogate as well as nursing, discussions with consultants, evaluation of patient's  response to treatment, examination of patient, obtaining history from patient or surrogate, ordering and performing treatments and interventions, ordering and review of laboratory studies, ordering and review of radiographic studies, pulse oximetry and re-evaluation of patient's condition.  Glori Bickers, MD  8:15 AM

## 2021-04-24 NOTE — Interval H&P Note (Signed)
History and Physical Interval Note:  04/24/2021 1:42 PM  Victor [name]  has presented today for surgery, with the diagnosis of afib.  The various methods of treatment have been discussed with the patient and family. After consideration of risks, benefits and other options for treatment, the patient has consented to  Procedure(s): CARDIOVERSION (N/A) as a surgical intervention.  The patient's history has been reviewed, patient examined, no change in status, stable for surgery.  I have reviewed the patient's chart and labs.  Questions were answered to the patient's satisfaction.     Johnn Krasowski

## 2021-04-24 NOTE — Transfer of Care (Signed)
Immediate Anesthesia Transfer of Care Note  Patient: Victor [name]  Procedure(s) Performed: CARDIOVERSION  Patient Location: Endoscopy Unit  Anesthesia Type:MAC  Level of Consciousness: awake, alert , oriented and patient cooperative  Airway & Oxygen Therapy: Patient Spontanous Breathing and Patient connected to nasal cannula oxygen  Post-op Assessment: Report given to RN and Post -op Vital signs reviewed and stable  Post vital signs: Reviewed and stable  Last Vitals:  Vitals Value Taken Time  BP 137/61   Temp    Pulse 105   Resp 41   SpO2 95     Last Pain:  Vitals:   04/24/21 1304  TempSrc: Temporal  PainSc: 0-No pain      Patients Stated Pain Goal: 0 (08/18/62 3837)  Complications: No notable events documented.

## 2021-04-24 NOTE — Anesthesia Procedure Notes (Signed)
Procedure Name: MAC Date/Time: 04/24/2021 1:40 PM Performed by: Alain Marion, CRNA Pre-anesthesia Checklist: Patient identified, Emergency Drugs available, Suction available and Patient being monitored Patient Re-evaluated:Patient Re-evaluated prior to induction Oxygen Delivery Method: Ambu bag Preoxygenation: Pre-oxygenation with 100% oxygen Induction Type: IV induction Ventilation: Mask ventilation without difficulty Placement Confirmation: positive ETCO2

## 2021-04-24 NOTE — H&P (View-Only) (Signed)
Patient ID: Victor [name], male   DOB: 1960-07-26, 60 y.o.   MRN: [id number]     Advanced Heart Failure Rounding Note  PCP-Cardiologist: Larae Grooms, MD  [facility]: Dr. [name]   Subjective:    12/14: Admitted w/ a/c CHF w/ low output and new Afib w/ RVR. Co-ox 38%. Placed on NE, milrinone and amio gtt.   Echo EF 20-25%, RV normal. Mod MR.  TEE-guided DCCV to NSR on 12/16, TEE with EF 20-25%, moderate to severe RV dysfunction, severe functional MR.    Went back into AF over the weekend with recurrent shock and co-ox in the 83s. Now on milrinone 0.25, NE 3. Lasix gtt at 20 and amio gtt.  CVP 20  SCr slightly improved 2.1 -> 1.9. Co-ox 62% Sodium 125  Mildly SOB. + Orthopnea   Objective:   Weight Range: 125.9 kg Body mass index is 36.62 kg/m.   Vital Signs:   Temp:  [97.8 F (36.6 C)-101 F (38.3 C)] 98.2 F (36.8 C) (12/19 0625) Pulse Rate:  [87-224] 108 (12/19 0745) Resp:  [17-46] 37 (12/19 0745) BP: (74-174)/(38-138) 103/87 (12/19 0745) SpO2:  [78 %-95 %] 91 % (12/19 0745) Weight:  [125.9 kg] 125.9 kg (12/19 0500) Last BM Date: 04/22/21  Weight change: Filed Weights   04/21/21 1239 04/23/21 0500 04/24/21 0500  Weight: 125.5 kg 125.5 kg 125.9 kg    Intake/Output:   Intake/Output Summary (Last 24 hours) at 04/24/2021 0815 Last data filed at 04/24/2021 0700 Gross per 24 hour  Intake 2493.29 ml  Output 2450 ml  Net 43.29 ml       Physical Exam    CVP 20 General:  Sitting up in chair. No resp difficulty HEENT: normal Neck: supple. JVP to jaw Carotids 2+ bilat; no bruits. No lymphadenopathy or thryomegaly appreciated. Cor: PMI nondisplaced. Irregular rate & rhythm.+s3. Lungs: clear Abdomen: soft, nontender, nondistended. No hepatosplenomegaly. No bruits or masses. Good bowel sounds. Extremities: no cyanosis, clubbing, rash, 1+ edema. Scarring from previous LLE wound Neuro: alert & orientedx3, cranial nerves grossly intact. moves all 4 extremities w/o  difficulty. Affect pleasant   Telemetry   Afib w/ RVR 100-110, occasional PVCs. Personally reviewed  Labs    CBC Recent Labs    04/23/21 0213 04/24/21 0300  WBC 11.0* 9.9  HGB 17.4* 15.0  HCT 51.6 44.9  MCV 90.1 90.3  PLT 153 132*    Basic Metabolic Panel Recent Labs    04/23/21 2136 04/24/21 0300  NA 124* 125*  K 3.1* 3.2*  CL 90* 88*  CO2 22 23  GLUCOSE 338* 443*  BUN 29* 25*  CREATININE 2.09* 1.90*  CALCIUM 8.2* 7.5*  MG 1.6* 1.8    Liver Function Tests Recent Labs    04/23/21 1958 04/23/21 2136  AST 20 26  ALT 25 30  ALKPHOS 34* 41  BILITOT 1.6* 2.0*  PROT 5.9* 6.6  ALBUMIN 3.2* 3.6    No results for input(s): LIPASE, AMYLASE in the last 72 hours. Cardiac Enzymes No results for input(s): CKTOTAL, CKMB, CKMBINDEX, TROPONINI in the last 72 hours.  BNP: BNP (last 3 results) Recent Labs    01/23/21 1104 03/08/21 1605 04/19/21 0954  BNP 119.8* 390.0* 154.2*     ProBNP (last 3 results) No results for input(s): PROBNP in the last 8760 hours.   D-Dimer No results for input(s): DDIMER in the last 72 hours.  Hemoglobin A1C Recent Labs    04/22/21 1102  HGBA1C 5.7*    Fasting Lipid Panel  No results for input(s): CHOL, HDL, LDLCALC, TRIG, CHOLHDL, LDLDIRECT in the last 72 hours. Thyroid Function Tests No results for input(s): TSH, T4TOTAL, T3FREE, THYROIDAB in the last 72 hours.  Invalid input(s): FREET3   Other results:   Imaging    No results found.   Medications:     Scheduled Medications:  alteplase  2 mg Intracatheter Once   Chlorhexidine Gluconate Cloth  6 each Topical Daily   digoxin  0.125 mg Oral Daily   insulin aspart  0-15 Units Subcutaneous TID WC   mouth rinse  15 mL Mouth Rinse BID   potassium chloride  20 mEq Oral Once   pregabalin  150 mg Oral TID   sodium chloride flush  10-40 mL Intracatheter Q12H   sodium chloride flush  3 mL Intravenous Q12H    Infusions:  sodium chloride     sodium chloride      sodium chloride     amiodarone 60 mg/hr (04/24/21 0600)   furosemide (LASIX) 200 mg in dextrose 5% 100 mL (2mg /mL) infusion 20 mg/hr (04/24/21 3009)   heparin 1,750 Units/hr (04/24/21 0701)   milrinone 0.25 mcg/kg/min (04/24/21 0635)   norepinephrine (LEVOPHED) Adult infusion 3 mcg/min (04/24/21 0600)   potassium chloride 10 mEq (04/24/21 0755)    PRN Medications: sodium chloride, Place/Maintain arterial line **AND** sodium chloride, acetaminophen, ALPRAZolam, alum & mag hydroxide-simeth, guaiFENesin-dextromethorphan, HYDROmorphone (DILAUDID) injection, hydrOXYzine, phenol, sodium chloride flush, sodium chloride flush, traZODone   Assessment/Plan   1. Acute on Chronic Systolic Heart Failure>>Cardiogenic Shock  - diagnosed in 2014. Cath at that time normal cors with EF 20-25% - Echo 10/20 EF 20-25% RV ok. Moderate to severe MR - Cath 11/20 coronaries normal - Echo 01/23/21 EF 25% RV ok. Mild MR - CPX 06/2020 with overall moderate limitation due to CHF and body habitus.  pVO 14.2 slope 30 - Admitted w/ worsening symptoms,NYHA IV and low output in setting of new Afib w/ RVR. Initial Co-ox 38%>>Milrinone and NE added - Echo EF 20-25%, RV ok. Mod MR.  TEE EF 20-25%, moderate to severe RV dysfunction, severe functional MR.   - on Milrinone 0.25 + NE 3 Co-ox 62%. CVP 13 - TEE-guided DCCV to NSR 12/16 but back in atrial fibrillation with RVR shortly afterwards.   - Now on lasix gtt at 20 and amio gtt. Give metolazone 2.5 x 1 - Holding home Entresto/Farxiga w/ AKI and hypotension  - Holding ? blocker w/ hypotension and low output  - Has seen Dr. Caryl Comes for ICD +/- CRT but  was reluctant to proceed - Need to consider w/u for Advanced therapies, transplant vs VAD (probably transplant with age, RV dysfunction).  - May get improvement with NSR (decompensation may be tachy-mediated).  Have wanted to wean inotrope/pressor, load more amiodarone, and cardiovert again.  Failed inotrope wean over  weekend - Continue IV lasix - I d/w [facility] transplant team. Suspect leg will not disqualify him. Plan Impella 5.5 tomorrow or Wednesday with bridge to transplant D/w him and his family. VAD team to see today.    2. Atrial Fibrillation w/ RVR - New. Duration unknown.  - TFTs normal. Respiratory Panel negative  - C/w IV heparin for now until need for procedures ruled out  - TEE/DCCV to NSR 12/16 but back in AF with RVR 12/17, still in RVR this morning.  - Keep K > 4.0 and Mg > 2.0  - Sleep study 5/21>>PSG very mild OSA . No CPAP for now  - Continue amiodarone  gtt 60 mg/hr this morning. Plan repeat DC-CV ths aa - As above, will attempt repeat DC-CV today but doubt will hold.   3. AKI - Baseline SCr ~1.1-1.3 - SCr 2.1 -> 1.9 - suspect 2/2 cardiorenal/low output - support hemodynamics   4. Polycythemia - Sleep study not overly impressive. - Being followed at [facility] for polycythemia. Work-up including JAK2 mutation and BMBx have been negative.  - Plan for therapeutic phlebotomy if HCT >= 54 (50 today) - starting a/c given new Afib    5. Mitral Regurgitation  - Severe functional MR on TEE.  If he stabilizes out, would repeat echo in NSR and consider Mitraclip if remains severe.   6. NSVT - monitor w/ dual inotropes  - continue amio gtt - Keep K > 4.0 and Mg >2.0   7. Hypokalemia - supp w/ KCL w/diuresis   8. Anxiety - Significant, on Xanax.   CRITICAL CARE Performed by: Glori Bickers  Total critical care time: 55 minutes  Critical care time was exclusive of separately billable procedures and treating other patients.  Critical care was necessary to treat or prevent imminent or life-threatening deterioration.  Critical care was time spent personally by me (independent of midlevel providers or residents) on the following activities: development of treatment plan with patient and/or surrogate as well as nursing, discussions with consultants, evaluation of patient's  response to treatment, examination of patient, obtaining history from patient or surrogate, ordering and performing treatments and interventions, ordering and review of laboratory studies, ordering and review of radiographic studies, pulse oximetry and re-evaluation of patient's condition.  Glori Bickers, MD  8:15 AM
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
## 2021-04-24 NOTE — CV Procedure (Signed)
° °   DIRECT CURRENT CARDIOVERSION  NAME:  Victor [name]   MRN: [id number] DOB:  [birth year]   ADMIT DATE: 04/19/2021   INDICATIONS: Atrial fibrillation    PROCEDURE:   Informed consent was obtained prior to the procedure. The risks, benefits and alternatives for the procedure were discussed and the patient comprehended these risks. Once an appropriate time out was taken, the patient had the defibrillator pads placed in the anterior and posterior position. The patient then underwent sedation by the anesthesia service. Once an appropriate level of sedation was achieved, the patient received a single biphasic, synchronized 200J shock with prompt conversion to sinus rhythm. No apparent complications.  Glori Bickers, MD  1:54 PM
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
## 2021-04-24 NOTE — Progress Notes (Signed)
[name]:                VAD evaluation reviewed by patient and wife. Initial VAD teaching initiated with pt and caregiver.    VAD educational packet including "Understanding Your Options with Advanced Heart Failure", "[place] Patient Agreement for VAD Evaluation and Potential Implantation" consent, and [employer] "Heartmate 3 Left Ventricular Device (LVAD) Patient Guide", "[place] HM III Patient Education", "[place] Mechanical Circulatory Support Program", and "Decision Aids for Left Ventricular Assist Device" reviewed in detail and left at bedside for continued reference.      Explained that LVAD can be implanted for two indications in the setting of advanced left ventricular heart failure treatment:   Bridge to transplant - used for patients who cannot safely wait for heart transplant without this device.   Or    Destination therapy - used for patients until end of life or recovery of heart function.   The patient and his wife would like time to review educational packet and talk with each other. Cardioversion  planned later today per Dr. [name].    VAD Coordinator will be back by tomorrow to continue education if patient/wife would like that. We can discuss evaluation further at that time. Both patient and wife verbalized agreement to this plan.     Zada Girt, RN VAD Coordinator    Office: [phone] 24/7 VAD Pager: [phone]

## 2021-04-24 NOTE — Anesthesia Preprocedure Evaluation (Signed)
Anesthesia Evaluation  Patient identified by MRN, date of birth, ID band Patient awake    Reviewed: Allergy & Precautions, H&P , NPO status , Patient's Chart, lab work & pertinent test results  Airway Mallampati: II   Neck ROM: full    Dental   Pulmonary asthma ,    breath sounds clear to auscultation       Cardiovascular hypertension, +CHF  + dysrhythmias Atrial Fibrillation  Rhythm:irregular Rate:Normal  Echo EF 20-25%, RV normal. Mod MR.  Levo, milrinone, amio gtts   Neuro/Psych PSYCHIATRIC DISORDERS Anxiety  Neuromuscular disease    GI/Hepatic   Endo/Other    Renal/GU Renal InsufficiencyRenal disease     Musculoskeletal   Abdominal   Peds  Hematology   Anesthesia Other Findings   Reproductive/Obstetrics                             Anesthesia Physical Anesthesia Plan  ASA: 3  Anesthesia Plan: General   Post-op Pain Management:    Induction: Intravenous  PONV Risk Score and Plan: 2 and Propofol infusion and Treatment may vary due to age or medical condition  Airway Management Planned: Mask  Additional Equipment:   Intra-op Plan:   Post-operative Plan:   Informed Consent: I have reviewed the patients History and Physical, chart, labs and discussed the procedure including the risks, benefits and alternatives for the proposed anesthesia with the patient or authorized representative who has indicated his/her understanding and acceptance.     Dental advisory given  Plan Discussed with: CRNA, Anesthesiologist and Surgeon  Anesthesia Plan Comments:         Anesthesia Quick Evaluation

## 2021-04-24 NOTE — Progress Notes (Signed)
Had isolated fever last night but discordant when rechecked axillary compared to oral. Again tonight with low grade fevers, off tylenol prn. Repeat check with true fever 38.0 x2 (oral and axillary). Having ongoing non productive cough. Will check CXR/eRVP/UA-Ucx/Bcx. Not starting abx yet, other than cough asx.

## 2021-04-25 DIAGNOSIS — Z7189 Other specified counseling: Secondary | ICD-10-CM

## 2021-04-25 DIAGNOSIS — Z515 Encounter for palliative care: Secondary | ICD-10-CM

## 2021-04-25 LAB — TSH: TSH: 1.292 u[IU]/mL (ref 0.350–4.500)

## 2021-04-25 LAB — ABO/RH: ABO/RH(D): A POS

## 2021-04-25 LAB — BASIC METABOLIC PANEL
Anion gap: 13 (ref 5–15)
BUN: 32 mg/dL — ABNORMAL HIGH (ref 6–20)
CO2: 26 mmol/L (ref 22–32)
Calcium: 8.7 mg/dL — ABNORMAL LOW (ref 8.9–10.3)
Chloride: 91 mmol/L — ABNORMAL LOW (ref 98–111)
Creatinine, Ser: 1.97 mg/dL — ABNORMAL HIGH (ref 0.61–1.24)
GFR, Estimated: 38 mL/min — ABNORMAL LOW (ref >60)
Glucose, Bld: 245 mg/dL — ABNORMAL HIGH (ref 70–99)
Potassium: 3.3 mmol/L — ABNORMAL LOW (ref 3.5–5.1)
Sodium: 130 mmol/L — ABNORMAL LOW (ref 135–145)

## 2021-04-25 LAB — PROTIME-INR
INR: 1.4 — ABNORMAL HIGH (ref 0.8–1.2)
Prothrombin Time: 16.7 seconds — ABNORMAL HIGH (ref 11.4–15.2)

## 2021-04-25 LAB — RESPIRATORY PANEL BY PCR

## 2021-04-25 LAB — TYPE AND SCREEN
ABO/RH(D): A POS
Antibody Screen: NEGATIVE

## 2021-04-25 LAB — MAGNESIUM: Magnesium: 1.8 mg/dL (ref 1.7–2.4)

## 2021-04-25 LAB — CBC
HCT: 45.9 % (ref 39.0–52.0)
Hemoglobin: 15.3 g/dL (ref 13.0–17.0)
MCH: 29.8 pg (ref 26.0–34.0)
MCHC: 33.3 g/dL (ref 30.0–36.0)
MCV: 89.5 fL (ref 80.0–100.0)
Platelets: 136 10*3/uL — ABNORMAL LOW (ref 150–400)
RBC: 5.13 MIL/uL (ref 4.22–5.81)
RDW: 13.5 % (ref 11.5–15.5)
WBC: 10.5 10*3/uL (ref 4.0–10.5)
nRBC: 0 % (ref 0.0–0.2)

## 2021-04-25 LAB — PSA: Prostatic Specific Antigen: 7.65 ng/mL — ABNORMAL HIGH (ref 0.00–4.00)

## 2021-04-25 LAB — HEPARIN LEVEL (UNFRACTIONATED)
Heparin Unfractionated: 0.19 IU/mL — ABNORMAL LOW (ref 0.30–0.70)
Heparin Unfractionated: 0.36 IU/mL (ref 0.30–0.70)

## 2021-04-25 LAB — PREALBUMIN: Prealbumin: 12.4 mg/dL — ABNORMAL LOW (ref 18–38)

## 2021-04-25 LAB — URINALYSIS, ROUTINE W REFLEX MICROSCOPIC
Bacteria, UA: NONE SEEN
Bilirubin Urine: NEGATIVE
Glucose, UA: NEGATIVE mg/dL
Ketones, ur: NEGATIVE mg/dL
Leukocytes,Ua: NEGATIVE
Nitrite: NEGATIVE
Protein, ur: NEGATIVE mg/dL
Specific Gravity, Urine: 1.008 (ref 1.005–1.030)
pH: 5 (ref 5.0–8.0)

## 2021-04-25 LAB — T4, FREE: Free T4: 2.54 ng/dL — ABNORMAL HIGH (ref 0.61–1.12)

## 2021-04-25 LAB — LIPID PANEL
Cholesterol: 127 mg/dL (ref 0–200)
HDL: 28 mg/dL — ABNORMAL LOW (ref >40)
LDL Cholesterol: 83 mg/dL (ref 0–99)
Total CHOL/HDL Ratio: 4.5 RATIO
Triglycerides: 80 mg/dL (ref <150)
VLDL: 16 mg/dL (ref 0–40)

## 2021-04-25 LAB — HEPATITIS B SURFACE ANTIGEN: Hepatitis B Surface Ag: NONREACTIVE

## 2021-04-25 LAB — RAPID URINE DRUG SCREEN, HOSP PERFORMED
Amphetamines: NOT DETECTED
Barbiturates: NOT DETECTED
Benzodiazepines: POSITIVE — AB
Cocaine: NOT DETECTED
Opiates: NOT DETECTED
Tetrahydrocannabinol: NOT DETECTED

## 2021-04-25 LAB — GLUCOSE, CAPILLARY
Glucose-Capillary: 163 mg/dL — ABNORMAL HIGH (ref 70–99)
Glucose-Capillary: 190 mg/dL — ABNORMAL HIGH (ref 70–99)
Glucose-Capillary: 143 mg/dL — ABNORMAL HIGH (ref 70–99)
Glucose-Capillary: 164 mg/dL — ABNORMAL HIGH (ref 70–99)

## 2021-04-25 LAB — ANTITHROMBIN III: AntiThromb III Func: 63 % — ABNORMAL LOW (ref 75–120)

## 2021-04-25 LAB — COOXEMETRY PANEL
Carboxyhemoglobin: 1.4 % (ref 0.5–1.5)
Methemoglobin: 0.7 % (ref 0.0–1.5)
O2 Saturation: 56 %
Total hemoglobin: 15.3 g/dL (ref 12.0–16.0)

## 2021-04-25 LAB — SURGICAL PCR SCREEN
MRSA, PCR: NEGATIVE
Staphylococcus aureus: NEGATIVE

## 2021-04-25 LAB — APTT: aPTT: 88 seconds — ABNORMAL HIGH (ref 24–36)

## 2021-04-25 LAB — URIC ACID: Uric Acid, Serum: 10.9 mg/dL — ABNORMAL HIGH (ref 3.7–8.6)

## 2021-04-25 LAB — HEPATITIS B CORE ANTIBODY, IGM: Hep B C IgM: NONREACTIVE

## 2021-04-25 LAB — HEPATITIS C ANTIBODY: HCV Ab: NONREACTIVE

## 2021-04-25 LAB — LACTATE DEHYDROGENASE: LDH: 177 U/L (ref 98–192)

## 2021-04-25 MED ORDER — VANCOMYCIN HCL 2000 MG/400ML IV SOLN
2000.0000 mg | Freq: Once | INTRAVENOUS | Status: AC
Start: 2021-04-25 — End: 2021-04-25
  Administered 2021-04-25: 13:00:00 2000 mg via INTRAVENOUS
  Filled 2021-04-25: qty 400

## 2021-04-25 MED ORDER — SERTRALINE HCL 50 MG PO TABS
25.0000 mg | ORAL_TABLET | Freq: Every day | ORAL | Status: DC
Start: 2021-04-25 — End: 2021-04-27
  Administered 2021-04-25: 09:00:00 25 mg via ORAL
  Filled 2021-04-25: qty 1

## 2021-04-25 MED ORDER — POTASSIUM CHLORIDE CRYS ER 20 MEQ PO TBCR
40.0000 meq | EXTENDED_RELEASE_TABLET | Freq: Three times a day (TID) | ORAL | Status: DC
  Administered 2021-04-25 (×3): 40 meq via ORAL
  Filled 2021-04-25 (×4): qty 2

## 2021-04-25 MED ORDER — POTASSIUM CHLORIDE CRYS ER 20 MEQ PO TBCR: 40.0000 meq | EXTENDED_RELEASE_TABLET | Freq: Two times a day (BID) | ORAL | Status: DC

## 2021-04-25 MED ORDER — MAGNESIUM SULFATE 2 GM/50ML IV SOLN: 2.0000 g | Freq: Once | INTRAVENOUS | Status: DC

## 2021-04-25 MED ORDER — MAGNESIUM SULFATE 2 GM/50ML IV SOLN
2.0000 g | Freq: Once | INTRAVENOUS | Status: AC
Start: 2021-04-25 — End: 2021-04-25
  Administered 2021-04-25: 06:00:00 2 g via INTRAVENOUS
  Filled 2021-04-25: qty 50

## 2021-04-25 MED ORDER — METOLAZONE 2.5 MG PO TABS
2.5000 mg | ORAL_TABLET | Freq: Once | ORAL | Status: AC
  Administered 2021-04-25: 09:00:00 2.5 mg via ORAL
  Filled 2021-04-25: qty 1

## 2021-04-25 MED ORDER — VANCOMYCIN HCL IN DEXTROSE 1-5 GM/200ML-% IV SOLN
1000.0000 mg | Freq: Two times a day (BID) | INTRAVENOUS | Status: DC
Start: 2021-04-25 — End: 2021-04-29
  Administered 2021-04-25 – 2021-04-28 (×6): 1000 mg via INTRAVENOUS
  Filled 2021-04-25 (×8): qty 200

## 2021-04-25 MED ORDER — SODIUM CHLORIDE 0.9 % IV SOLN
2.0000 g | Freq: Once | INTRAVENOUS | Status: AC
  Administered 2021-04-25: 12:00:00 2 g via INTRAVENOUS
  Filled 2021-04-25: qty 2

## 2021-04-25 MED ORDER — SODIUM CHLORIDE 0.9 % IV SOLN
2.0000 g | Freq: Two times a day (BID) | INTRAVENOUS | Status: DC
  Administered 2021-04-25 – 2021-04-26 (×3): 2 g via INTRAVENOUS
  Filled 2021-04-25 (×3): qty 2
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
## 2021-04-25 NOTE — Progress Notes (Signed)
Pharmacy Antibiotic Note  Abdiel Blackerby is a 60 y.o. male admitted on 04/19/2021 with Heart failure. Last PM patient spiked fever 101.  WBC wnl. BCx, UA and cxr - with PICC line and planning advanced therapies. Will begin empiric ABX after Cx drawn. Cr 2, crcl 73ml/min wt 125kg Pharmacy has been consulted for vancomycin and cefepime dosing.  Plan: Cefepime 2gm IV q12h Vancomycin 2gm x1 then 1gm q12h  Height: 6\' 1"  (185.4 cm) Weight: 124.5 kg (274 lb 7.6 oz) IBW/kg (Calculated) : 79.9  Temp (24hrs), Avg:99.8 F (37.7 C), Min:98.8 F (37.1 C), Max:101.1 F (38.4 C)  Recent Labs  Lab 04/19/21 1220 04/19/21 1706 04/19/21 1959 04/20/21 0403 04/21/21 0434 04/22/21 0417 04/23/21 0213 04/23/21 1958 04/23/21 2136 04/24/21 0300 04/25/21 0307  WBC  --   --   --    < > 8.1 7.0 11.0*  --   --  9.9 10.5  CREATININE  --   --   --    < > 1.52* 1.54* 2.05* 1.88* 2.09* 1.90* 1.97*  LATICACIDVEN 1.8 2.0* 1.4  --   --   --   --  1.1  --  1.0  --    < > = values in this interval not displayed.    Estimated Creatinine Clearance: 55.8 mL/min (A) (by C-G formula based on SCr of 1.97 mg/dL (H)).    No Known Allergies  Antimicrobials this admission:   Dose adjustments this admission:   Microbiology results: 12/20 Bcx    Bonnita Nasuti Pharm.D. CPP, BCPS Clinical Pharmacist [phone] 04/25/2021 3:32 PM

## 2021-04-25 NOTE — Anesthesia Preprocedure Evaluation (Addendum)
Anesthesia Evaluation  Patient identified by MRN, date of birth, ID band Patient awake    Reviewed: Allergy & Precautions, H&P , NPO status , Patient's Chart, lab work & pertinent test results, reviewed documented beta blocker date and time   Airway Mallampati: II  TM Distance: >3 FB Neck ROM: Full    Dental no notable dental hx. (+) Teeth Intact, Dental Advisory Given   Pulmonary asthma ,    Pulmonary exam normal breath sounds clear to auscultation       Cardiovascular Exercise Tolerance: Good hypertension, Pt. on medications and Pt. on home beta blockers +CHF   Rhythm:Regular Rate:Normal     Neuro/Psych Anxiety negative neurological ROS     GI/Hepatic negative GI ROS, Neg liver ROS,   Endo/Other  Morbid obesity  Renal/GU negative Renal ROS  negative genitourinary   Musculoskeletal  (+) Arthritis , Osteoarthritis,    Abdominal   Peds  Hematology negative hematology ROS (+)   Anesthesia Other Findings   Reproductive/Obstetrics negative OB ROS                            Anesthesia Physical Anesthesia Plan  ASA: 4  Anesthesia Plan: General   Post-op Pain Management: Tylenol PO (pre-op)   Induction: Intravenous  PONV Risk Score and Plan: 2 and Midazolam and Ondansetron  Airway Management Planned: Oral ETT  Additional Equipment: Arterial line, CVP, Ultrasound Guidance Line Placement, PA Cath and TEE  Intra-op Plan:   Post-operative Plan: Extubation in OR and Possible Post-op intubation/ventilation  Informed Consent: I have reviewed the patients History and Physical, chart, labs and discussed the procedure including the risks, benefits and alternatives for the proposed anesthesia with the patient or authorized representative who has indicated his/her understanding and acceptance.     Dental advisory given  Plan Discussed with: CRNA  Anesthesia Plan Comments: (TEE for  monitoring only.)      Anesthesia Quick Evaluation
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
## 2021-04-25 NOTE — Progress Notes (Signed)
[name]:                VAD evaluation consent reviewed and signed by patient. VAD teaching continued with pt.    Pt identified wife as his primary caregiver if this therapy should be deemed appropriate for Destination Therapy.  Explained need for 24/7 care when pt is discharged home due to sternal precautions, adaptation to living on support, emotional support, consistent and meticulous exit site care and management, medication adherence and high volume of follow up visits with the [facility] after discharge; both pt and caregiver verbalized understanding of above.   Explained that LVAD can be implanted for two indications in the setting of advanced left ventricular heart failure treatment:  Bridge to transplant - used for patients who cannot safely wait for heart transplant without this device.  Or    Destination therapy - used for patients until end of life or recovery of heart function.  Patient and caregiver(s) acknowledge that the indication at this point in time for LVAD therapy would be for Destination Therapy due to severity of illness.   Reviewed and supplied a copy of home inspection check list stressing that only three pronged grounded power outlets can be used for VAD equipment. Patient confirmed home has electrical outlets that will support the equipment along with access working telephone.  Identified the following lifestyle modifications while living on MCS:   1. No driving for at least six weeks and then only if doctor gives permission to do so.   2. No tub baths while pump implanted, and shower only when doctor gives permission.   3. No swimming or submersion in water while implanted with pump.   4. No contact sports or engaging in jumping activities.   5. Always have a backup controller, charged spare batteries, and battery clips nearby at all times in case of emergency.   6. Plan to sleep only when connected to the mobile power unit.   7. Keep a backup system  controller, charged batteries, battery clips, and flashlight near you during sleep in case of electrical power outage.   8. Exit site care including dressing changes, monitoring for infection, and importance of keeping percutaneous lead stabilized at all times.    Extended the option to have one of our current patients and caregiver(s) come to talk with him about living on support} to assist with decision making.   Reviewed pictures of VAD drive line, site care, dressing changes, and drive line stabilization including securement attachment device and abdominal binder. Discussed with pt and family that they will be required to purchase dressing supplies as long as patient has the VAD in place.   Reinforced need for 24 hour/7 day week caregivers; pt designated wife as caregiver. He will also need to abide by sternal precautions with no lifting >10lbs, pushing, pulling and will need assistance with adapting to new life style with VAD equipment and care.   Intermacs patient survival statistics through June 2022 reviewed with patient and caregiver as follows:                                   The patient understands that from this discussion it does not mean that they will receive the device, but that depends on an extensive evaluation process. The patient is aware of the fact that if at anytime they want to stop the evaluation process they can.  All questions have been answered at this time and contact information was provided should they encounter any further questions.  They are both agreeable at this time to the evaluation process and will move forward.    Zada Girt, RN VAD Coordinator   Office: [phone] 24/7 VAD Pager: [phone]

## 2021-04-26 ENCOUNTER — Inpatient Hospital Stay (HOSPITAL_COMMUNITY): Payer: Medicare Other

## 2021-04-26 ENCOUNTER — Inpatient Hospital Stay (HOSPITAL_COMMUNITY): Payer: Medicare Other | Admitting: Certified Registered"

## 2021-04-26 ENCOUNTER — Encounter (HOSPITAL_COMMUNITY)
Admission: EM | Disposition: A | Discharge status: Other Acute Inpatient Hospital | Payer: Self-pay | Source: Home / Self Care | Attending: Internal Medicine

## 2021-04-26 ENCOUNTER — Encounter (HOSPITAL_COMMUNITY): Payer: Self-pay | Admitting: Internal Medicine

## 2021-04-26 DIAGNOSIS — J9601 Acute respiratory failure with hypoxia: Secondary | ICD-10-CM

## 2021-04-26 DIAGNOSIS — I4891 Unspecified atrial fibrillation: Secondary | ICD-10-CM

## 2021-04-26 DIAGNOSIS — R509 Fever, unspecified: Secondary | ICD-10-CM

## 2021-04-26 DIAGNOSIS — R570 Cardiogenic shock: Secondary | ICD-10-CM

## 2021-04-26 DIAGNOSIS — R579 Shock, unspecified: Secondary | ICD-10-CM

## 2021-04-26 HISTORY — PX: PLACEMENT OF IMPELLA LEFT VENTRICULAR ASSIST DEVICE: SHX6519

## 2021-04-26 HISTORY — PX: TEE WITHOUT CARDIOVERSION: SHX5443

## 2021-04-26 LAB — BASIC METABOLIC PANEL
Anion gap: 16 — ABNORMAL HIGH (ref 5–15)
Anion gap: 13 (ref 5–15)
BUN: 36 mg/dL — ABNORMAL HIGH (ref 6–20)
BUN: 38 mg/dL — ABNORMAL HIGH (ref 6–20)
CO2: 25 mmol/L (ref 22–32)
CO2: 24 mmol/L (ref 22–32)
Calcium: 8 mg/dL — ABNORMAL LOW (ref 8.9–10.3)
Calcium: 9.3 mg/dL (ref 8.9–10.3)
Chloride: 79 mmol/L — ABNORMAL LOW (ref 98–111)
Chloride: 92 mmol/L — ABNORMAL LOW (ref 98–111)
Creatinine, Ser: 1.96 mg/dL — ABNORMAL HIGH (ref 0.61–1.24)
Creatinine, Ser: 2.01 mg/dL — ABNORMAL HIGH (ref 0.61–1.24)
GFR, Estimated: 39 mL/min — ABNORMAL LOW (ref >60)
GFR, Estimated: 38 mL/min — ABNORMAL LOW (ref >60)
Glucose, Bld: 502 mg/dL (ref 70–99)
Glucose, Bld: 144 mg/dL — ABNORMAL HIGH (ref 70–99)
Potassium: 3.4 mmol/L — ABNORMAL LOW (ref 3.5–5.1)
Potassium: 3.8 mmol/L (ref 3.5–5.1)
Sodium: 120 mmol/L — ABNORMAL LOW (ref 135–145)
Sodium: 129 mmol/L — ABNORMAL LOW (ref 135–145)

## 2021-04-26 LAB — HEPARIN LEVEL (UNFRACTIONATED): Heparin Unfractionated: 0.3 IU/mL (ref 0.30–0.70)

## 2021-04-26 LAB — COMPREHENSIVE METABOLIC PANEL
ALT: 24 U/L (ref 0–44)
AST: 32 U/L (ref 15–41)
Albumin: 3.5 g/dL (ref 3.5–5.0)
Alkaline Phosphatase: 31 U/L — ABNORMAL LOW (ref 38–126)
Anion gap: 15 (ref 5–15)
BUN: 40 mg/dL — ABNORMAL HIGH (ref 6–20)
CO2: 20 mmol/L — ABNORMAL LOW (ref 22–32)
Calcium: 8.1 mg/dL — ABNORMAL LOW (ref 8.9–10.3)
Chloride: 93 mmol/L — ABNORMAL LOW (ref 98–111)
Creatinine, Ser: 2.28 mg/dL — ABNORMAL HIGH (ref 0.61–1.24)
GFR, Estimated: 32 mL/min — ABNORMAL LOW (ref >60)
Glucose, Bld: 241 mg/dL — ABNORMAL HIGH (ref 70–99)
Potassium: 3.4 mmol/L — ABNORMAL LOW (ref 3.5–5.1)
Sodium: 128 mmol/L — ABNORMAL LOW (ref 135–145)
Total Bilirubin: 1.7 mg/dL — ABNORMAL HIGH (ref 0.3–1.2)
Total Protein: 6.4 g/dL — ABNORMAL LOW (ref 6.5–8.1)

## 2021-04-26 LAB — CBC
HCT: 43.4 % (ref 39.0–52.0)
HCT: 44.3 % (ref 39.0–52.0)
Hemoglobin: 14.3 g/dL (ref 13.0–17.0)
Hemoglobin: 14.7 g/dL (ref 13.0–17.0)
MCH: 29.3 pg (ref 26.0–34.0)
MCH: 29.6 pg (ref 26.0–34.0)
MCHC: 32.9 g/dL (ref 30.0–36.0)
MCHC: 33.2 g/dL (ref 30.0–36.0)
MCV: 88.9 fL (ref 80.0–100.0)
MCV: 89.3 fL (ref 80.0–100.0)
Platelets: 142 10*3/uL — ABNORMAL LOW (ref 150–400)
Platelets: 183 10*3/uL (ref 150–400)
RBC: 4.88 MIL/uL (ref 4.22–5.81)
RBC: 4.96 MIL/uL (ref 4.22–5.81)
RDW: 13.5 % (ref 11.5–15.5)
RDW: 13.1 % (ref 11.5–15.5)
WBC: 8.2 10*3/uL (ref 4.0–10.5)
WBC: 18.2 10*3/uL — ABNORMAL HIGH (ref 4.0–10.5)
nRBC: 0 % (ref 0.0–0.2)
nRBC: 0 % (ref 0.0–0.2)

## 2021-04-26 LAB — COOXEMETRY PANEL
Carboxyhemoglobin: 1.5 % (ref 0.5–1.5)
Carboxyhemoglobin: 1.1 % (ref 0.5–1.5)
Methemoglobin: 0.7 % (ref 0.0–1.5)
Methemoglobin: 0.7 % (ref 0.0–1.5)
O2 Saturation: 46.3 %
O2 Saturation: 79 %
Total hemoglobin: 14.7 g/dL (ref 12.0–16.0)
Total hemoglobin: 14.8 g/dL (ref 12.0–16.0)

## 2021-04-26 LAB — POCT I-STAT 7, (LYTES, BLD GAS, ICA,H+H)
Acid-Base Excess: 0 mmol/L (ref 0.0–2.0)
Acid-Base Excess: 0 mmol/L (ref 0.0–2.0)
Bicarbonate: 26 mmol/L (ref 20.0–28.0)
Bicarbonate: 25.1 mmol/L (ref 20.0–28.0)
Calcium, Ion: 1.13 mmol/L — ABNORMAL LOW (ref 1.15–1.40)
Calcium, Ion: 1.13 mmol/L — ABNORMAL LOW (ref 1.15–1.40)
HCT: 47 % (ref 39.0–52.0)
HCT: 48 % (ref 39.0–52.0)
Hemoglobin: 16 g/dL (ref 13.0–17.0)
Hemoglobin: 16.3 g/dL (ref 13.0–17.0)
O2 Saturation: 100 %
O2 Saturation: 96 %
Potassium: 3.4 mmol/L — ABNORMAL LOW (ref 3.5–5.1)
Potassium: 3.7 mmol/L (ref 3.5–5.1)
Sodium: 130 mmol/L — ABNORMAL LOW (ref 135–145)
Sodium: 129 mmol/L — ABNORMAL LOW (ref 135–145)
TCO2: 27 mmol/L (ref 22–32)
TCO2: 26 mmol/L (ref 22–32)
pCO2 arterial: 46.5 mmHg (ref 32.0–48.0)
pCO2 arterial: 41.1 mmHg (ref 32.0–48.0)
pH, Arterial: 7.356 (ref 7.350–7.450)
pH, Arterial: 7.393 (ref 7.350–7.450)
pO2, Arterial: 178 mmHg — ABNORMAL HIGH (ref 83.0–108.0)
pO2, Arterial: 82 mmHg — ABNORMAL LOW (ref 83.0–108.0)

## 2021-04-26 LAB — GLUCOSE, CAPILLARY
Glucose-Capillary: 166 mg/dL — ABNORMAL HIGH (ref 70–99)
Glucose-Capillary: 168 mg/dL — ABNORMAL HIGH (ref 70–99)
Glucose-Capillary: 154 mg/dL — ABNORMAL HIGH (ref 70–99)
Glucose-Capillary: 260 mg/dL — ABNORMAL HIGH (ref 70–99)
Glucose-Capillary: 239 mg/dL — ABNORMAL HIGH (ref 70–99)
Glucose-Capillary: 248 mg/dL — ABNORMAL HIGH (ref 70–99)
Glucose-Capillary: 222 mg/dL — ABNORMAL HIGH (ref 70–99)
Glucose-Capillary: 198 mg/dL — ABNORMAL HIGH (ref 70–99)
Glucose-Capillary: 202 mg/dL — ABNORMAL HIGH (ref 70–99)

## 2021-04-26 LAB — FIBRINOGEN: Fibrinogen: 470 mg/dL (ref 210–475)

## 2021-04-26 LAB — PROTIME-INR
INR: 1.3 — ABNORMAL HIGH (ref 0.8–1.2)
Prothrombin Time: 16.1 seconds — ABNORMAL HIGH (ref 11.4–15.2)

## 2021-04-26 LAB — ECHO INTRAOPERATIVE TEE
Height: 73 in
Weight: 4320 oz

## 2021-04-26 LAB — HEMOGLOBIN A1C
Hgb A1c MFr Bld: 5.7 % — ABNORMAL HIGH (ref 4.8–5.6)
Mean Plasma Glucose: 116.89 mg/dL

## 2021-04-26 LAB — MAGNESIUM
Magnesium: 1.8 mg/dL (ref 1.7–2.4)
Magnesium: 1.9 mg/dL (ref 1.7–2.4)

## 2021-04-26 LAB — DIGOXIN LEVEL: Digoxin Level: 0.6 ng/mL — ABNORMAL LOW (ref 0.8–2.0)

## 2021-04-26 LAB — LACTATE DEHYDROGENASE
LDH: 268 U/L — ABNORMAL HIGH (ref 98–192)
LDH: 288 U/L — ABNORMAL HIGH (ref 98–192)

## 2021-04-26 LAB — LACTIC ACID, PLASMA: Lactic Acid, Venous: 5.7 mmol/L (ref 0.5–1.9)

## 2021-04-26 SURGERY — INSERTION, CARDIAC ASSIST DEVICE, IMPELLA
Anesthesia: General

## 2021-04-26 MED ORDER — PROPOFOL 10 MG/ML IV BOLUS
INTRAVENOUS | Status: AC
  Filled 2021-04-26: qty 20

## 2021-04-26 MED ORDER — LACTATED RINGERS IV SOLN: INTRAVENOUS | Status: DC | PRN

## 2021-04-26 MED ORDER — ETOMIDATE 2 MG/ML IV SOLN
INTRAVENOUS | Status: AC
  Filled 2021-04-26: qty 10

## 2021-04-26 MED ORDER — DEXTROSE 50 % IV SOLN: 0.0000 mL | INTRAVENOUS | Status: DC | PRN

## 2021-04-26 MED ORDER — VASOPRESSIN 20 UNITS/100 ML INFUSION FOR SHOCK
0.0000 [IU]/min | INTRAVENOUS | Status: DC
  Administered 2021-04-26 – 2021-04-28 (×5): 0.04 [IU]/min via INTRAVENOUS
  Filled 2021-04-26 (×5): qty 100

## 2021-04-26 MED ORDER — ORAL CARE MOUTH RINSE: 15.0000 mL | Freq: Once | OROMUCOSAL | Status: AC

## 2021-04-26 MED ORDER — MIDAZOLAM HCL 2 MG/2ML IJ SOLN: INTRAMUSCULAR | Status: DC | PRN

## 2021-04-26 MED ORDER — MIDAZOLAM HCL 2 MG/2ML IJ SOLN
1.0000 mg | INTRAMUSCULAR | Status: DC | PRN
  Administered 2021-04-26: 17:00:00 1 mg via INTRAVENOUS

## 2021-04-26 MED ORDER — CHLORHEXIDINE GLUCONATE 0.12 % MT SOLN
OROMUCOSAL | Status: AC
  Administered 2021-04-26: 09:00:00 15 mL via OROMUCOSAL
  Filled 2021-04-26: qty 15

## 2021-04-26 MED ORDER — FENTANYL CITRATE PF 50 MCG/ML IJ SOSY
50.0000 ug | PREFILLED_SYRINGE | INTRAMUSCULAR | Status: AC | PRN
  Administered 2021-04-26 – 2021-04-27 (×3): 50 ug via INTRAVENOUS
  Filled 2021-04-26 (×4): qty 1

## 2021-04-26 MED ORDER — MIDAZOLAM HCL 2 MG/2ML IJ SOLN
INTRAMUSCULAR | Status: AC
  Filled 2021-04-26: qty 2

## 2021-04-26 MED ORDER — ROCURONIUM BROMIDE 10 MG/ML (PF) SYRINGE
PREFILLED_SYRINGE | INTRAVENOUS | Status: DC | PRN
  Administered 2021-04-26: 70 mg via INTRAVENOUS
  Administered 2021-04-26 (×3): 30 mg via INTRAVENOUS
  Administered 2021-04-26 (×2): 40 mg via INTRAVENOUS

## 2021-04-26 MED ORDER — VASOPRESSIN 20 UNIT/ML IV SOLN
INTRAVENOUS | Status: DC | PRN
  Administered 2021-04-26 (×2): 3 [IU] via INTRAVENOUS
  Administered 2021-04-26: 2 [IU] via INTRAVENOUS
  Administered 2021-04-26: 3 [IU] via INTRAVENOUS
  Administered 2021-04-26: 2 [IU] via INTRAVENOUS
  Administered 2021-04-26: 1 [IU] via INTRAVENOUS
  Administered 2021-04-26: 2 [IU] via INTRAVENOUS
  Administered 2021-04-26: 1 [IU] via INTRAVENOUS
  Administered 2021-04-26: .5 [IU] via INTRAVENOUS
  Administered 2021-04-26: 1 [IU] via INTRAVENOUS

## 2021-04-26 MED ORDER — MAGNESIUM SULFATE 2 GM/50ML IV SOLN
2.0000 g | Freq: Once | INTRAVENOUS | Status: AC
  Administered 2021-04-26: 06:00:00 2 g via INTRAVENOUS
  Filled 2021-04-26: qty 50

## 2021-04-26 MED ORDER — DOCUSATE SODIUM 50 MG/5ML PO LIQD
100.0000 mg | Freq: Two times a day (BID) | ORAL | Status: DC
  Administered 2021-04-27 (×2): 100 mg
  Filled 2021-04-26 (×2): qty 10

## 2021-04-26 MED ORDER — HEPARIN 6000 UNIT IRRIGATION SOLUTION
Status: DC | PRN
  Administered 2021-04-26: 1

## 2021-04-26 MED ORDER — EPINEPHRINE HCL 5 MG/250ML IV SOLN IN NS
0.5000 ug/min | INTRAVENOUS | Status: DC
  Administered 2021-04-26: 14 ug/min via INTRAVENOUS
  Administered 2021-04-27 (×3): 7 ug/min via INTRAVENOUS
  Filled 2021-04-26: qty 250
  Filled 2021-04-26: qty 500
  Filled 2021-04-26: qty 250

## 2021-04-26 MED ORDER — MAGNESIUM OXIDE -MG SUPPLEMENT 400 (240 MG) MG PO TABS: 400.0000 mg | ORAL_TABLET | Freq: Every day | ORAL | Status: DC

## 2021-04-26 MED ORDER — FENTANYL CITRATE PF 50 MCG/ML IJ SOSY
50.0000 ug | PREFILLED_SYRINGE | INTRAMUSCULAR | Status: DC | PRN
  Administered 2021-04-26: 23:00:00 150 ug via INTRAVENOUS
  Administered 2021-04-26: 23:00:00 50 ug via INTRAVENOUS
  Administered 2021-04-26: 22:00:00 100 ug via INTRAVENOUS
  Administered 2021-04-26 (×2): 50 ug via INTRAVENOUS
  Administered 2021-04-27 (×2): 150 ug via INTRAVENOUS
  Administered 2021-04-27: 04:00:00 100 ug via INTRAVENOUS
  Administered 2021-04-27: 05:00:00 150 ug via INTRAVENOUS
  Administered 2021-04-27: 01:00:00 100 ug via INTRAVENOUS
  Administered 2021-04-27: 07:00:00 150 ug via INTRAVENOUS
  Filled 2021-04-26: qty 3
  Filled 2021-04-26: qty 2
  Filled 2021-04-26: qty 1
  Filled 2021-04-26 (×4): qty 3
  Filled 2021-04-26: qty 1
  Filled 2021-04-26: qty 2
  Filled 2021-04-26: qty 3
  Filled 2021-04-26 (×2): qty 2

## 2021-04-26 MED ORDER — MIDAZOLAM HCL (PF) 5 MG/ML IJ SOLN
INTRAMUSCULAR | Status: DC | PRN
  Administered 2021-04-26: 2 mg via INTRAVENOUS
  Administered 2021-04-26 (×3): 1 mg via INTRAVENOUS
  Administered 2021-04-26: 3 mg via INTRAVENOUS

## 2021-04-26 MED ORDER — NOREPINEPHRINE 16 MG/250ML-% IV SOLN
3.0000 ug/min | INTRAVENOUS | Status: DC
Start: 2021-04-26 — End: 2021-04-26
  Administered 2021-04-26: 18:00:00 3 ug/min via INTRAVENOUS
  Filled 2021-04-26: qty 250

## 2021-04-26 MED ORDER — DEXMEDETOMIDINE HCL IN NACL 400 MCG/100ML IV SOLN: 0.4000 ug/kg/h | INTRAVENOUS | Status: DC

## 2021-04-26 MED ORDER — INSULIN ASPART 100 UNIT/ML IJ SOLN: 0.0000 [IU] | INTRAMUSCULAR | Status: DC

## 2021-04-26 MED ORDER — ALBUTEROL SULFATE (2.5 MG/3ML) 0.083% IN NEBU: 2.5000 mg | INHALATION_SOLUTION | RESPIRATORY_TRACT | Status: DC | PRN

## 2021-04-26 MED ORDER — DEXMEDETOMIDINE HCL IN NACL 400 MCG/100ML IV SOLN
0.0000 ug/kg/h | INTRAVENOUS | Status: DC
Start: 2021-04-26 — End: 2021-04-29
  Administered 2021-04-26: 21:00:00 1.2 ug/kg/h via INTRAVENOUS
  Administered 2021-04-26: 18:00:00 0.8 ug/kg/h via INTRAVENOUS
  Administered 2021-04-27 (×4): 1.2 ug/kg/h via INTRAVENOUS
  Filled 2021-04-26 (×5): qty 100

## 2021-04-26 MED ORDER — MIDAZOLAM HCL 2 MG/2ML IJ SOLN
INTRAMUSCULAR | Status: AC
  Administered 2021-04-26: 17:00:00 1 mg via INTRAVENOUS
  Filled 2021-04-26: qty 2

## 2021-04-26 MED ORDER — FENTANYL CITRATE (PF) 250 MCG/5ML IJ SOLN
INTRAMUSCULAR | Status: AC
  Filled 2021-04-26: qty 5

## 2021-04-26 MED ORDER — FAMOTIDINE IN NACL 20-0.9 MG/50ML-% IV SOLN
20.0000 mg | Freq: Two times a day (BID) | INTRAVENOUS | Status: DC
  Administered 2021-04-27: 01:00:00 20 mg via INTRAVENOUS
  Filled 2021-04-26: qty 50

## 2021-04-26 MED ORDER — POTASSIUM CHLORIDE 10 MEQ/50ML IV SOLN
10.0000 meq | INTRAVENOUS | Status: AC
  Administered 2021-04-26 – 2021-04-27 (×4): 10 meq via INTRAVENOUS
  Filled 2021-04-26 (×4): qty 50

## 2021-04-26 MED ORDER — EPINEPHRINE HCL 5 MG/250ML IV SOLN IN NS
INTRAVENOUS | Status: DC | PRN
  Administered 2021-04-26: 2 ug/min via INTRAVENOUS

## 2021-04-26 MED ORDER — ALBUMIN HUMAN 5 % IV SOLN: INTRAVENOUS | Status: DC | PRN

## 2021-04-26 MED ORDER — ROCURONIUM BROMIDE 10 MG/ML (PF) SYRINGE
PREFILLED_SYRINGE | INTRAVENOUS | Status: AC
  Filled 2021-04-26: qty 10

## 2021-04-26 MED ORDER — HEPARIN SODIUM (PORCINE) 5000 UNIT/ML IJ SOLN: INTRAVENOUS | Status: DC

## 2021-04-26 MED ORDER — POLYETHYLENE GLYCOL 3350 17 G PO PACK
17.0000 g | PACK | Freq: Every day | ORAL | Status: DC
  Administered 2021-04-27: 10:00:00 17 g
  Filled 2021-04-26: qty 1

## 2021-04-26 MED ORDER — INSULIN REGULAR(HUMAN) IN NACL 100-0.9 UT/100ML-% IV SOLN
INTRAVENOUS | Status: DC
  Administered 2021-04-26: 21:00:00 6.5 [IU]/h via INTRAVENOUS
  Filled 2021-04-26: qty 100

## 2021-04-26 MED ORDER — VANCOMYCIN HCL 1000 MG IV SOLR
INTRAVENOUS | Status: DC | PRN
  Administered 2021-04-26: 13:00:00 1000 mg via INTRAVENOUS

## 2021-04-26 MED ORDER — SODIUM CHLORIDE 0.9% IV SOLUTION: INTRAVENOUS | Status: DC | PRN

## 2021-04-26 MED ORDER — HEPARIN SODIUM (PORCINE) 1000 UNIT/ML IJ SOLN
INTRAMUSCULAR | Status: DC | PRN
  Administered 2021-04-26: 14000 [IU] via INTRAVENOUS

## 2021-04-26 MED ORDER — CHLORHEXIDINE GLUCONATE 0.12 % MT SOLN: 15.0000 mL | Freq: Once | OROMUCOSAL | Status: AC

## 2021-04-26 MED ORDER — SODIUM CHLORIDE (PF) 0.9 % IJ SOLN
INTRAMUSCULAR | Status: AC
  Filled 2021-04-26: qty 10

## 2021-04-26 MED ORDER — SODIUM CHLORIDE 0.9% FLUSH: 10.0000 mL | INTRAVENOUS | Status: DC | PRN

## 2021-04-26 MED ORDER — POTASSIUM CHLORIDE 20 MEQ PO PACK: 40.0000 meq | PACK | Freq: Once | ORAL | Status: DC

## 2021-04-26 MED ORDER — ACETAMINOPHEN 500 MG PO TABS
1000.0000 mg | ORAL_TABLET | Freq: Once | ORAL | Status: AC
  Administered 2021-04-26: 09:00:00 1000 mg via ORAL
  Filled 2021-04-26: qty 2

## 2021-04-26 MED ORDER — LACTATED RINGERS IV SOLN: INTRAVENOUS | Status: DC

## 2021-04-26 MED ORDER — ORAL CARE MOUTH RINSE
15.0000 mL | OROMUCOSAL | Status: DC
  Administered 2021-04-26 – 2021-04-27 (×7): 15 mL via OROMUCOSAL

## 2021-04-26 MED ORDER — SUGAMMADEX SODIUM 200 MG/2ML IV SOLN
2.0000 mg/kg | Freq: Once | INTRAVENOUS | Status: DC
  Filled 2021-04-26 (×2): qty 4

## 2021-04-26 MED ORDER — HEPARIN 6000 UNIT IRRIGATION SOLUTION
Status: AC
  Filled 2021-04-26: qty 500

## 2021-04-26 MED ORDER — CHLORHEXIDINE GLUCONATE 0.12% ORAL RINSE (MEDLINE KIT)
15.0000 mL | Freq: Two times a day (BID) | OROMUCOSAL | Status: DC
  Administered 2021-04-26 – 2021-04-27 (×2): 15 mL via OROMUCOSAL

## 2021-04-26 MED ORDER — NOREPINEPHRINE 16 MG/250ML-% IV SOLN
0.0000 ug/min | INTRAVENOUS | Status: DC
  Administered 2021-04-26: 23:00:00 30 ug/min via INTRAVENOUS
  Administered 2021-04-27: 07:00:00 24 ug/min via INTRAVENOUS
  Administered 2021-04-27: 21:00:00 16 ug/min via INTRAVENOUS
  Administered 2021-04-29: 09:00:00 5 ug/min via INTRAVENOUS
  Filled 2021-04-26 (×4): qty 250

## 2021-04-26 MED ORDER — VASOPRESSIN 20 UNITS/100 ML INFUSION FOR SHOCK
0.0000 [IU]/min | INTRAVENOUS | Status: DC
  Filled 2021-04-26: qty 100

## 2021-04-26 MED ORDER — FENTANYL CITRATE (PF) 250 MCG/5ML IJ SOLN
INTRAMUSCULAR | Status: DC | PRN
  Administered 2021-04-26: 50 ug via INTRAVENOUS
  Administered 2021-04-26: 200 ug via INTRAVENOUS

## 2021-04-26 MED ORDER — HEPARIN SODIUM (PORCINE) 5000 UNIT/ML IJ SOLN
INTRAMUSCULAR | Status: DC
Start: 2021-04-26 — End: 2021-04-29
  Filled 2021-04-26 (×2): qty 10

## 2021-04-26 MED ORDER — 0.9 % SODIUM CHLORIDE (POUR BTL) OPTIME
TOPICAL | Status: DC | PRN
  Administered 2021-04-26: 13:00:00 2000 mL

## 2021-04-26 MED ORDER — ALBUMIN HUMAN 5 % IV SOLN
12.5000 g | Freq: Once | INTRAVENOUS | Status: AC
  Administered 2021-04-26: 21:00:00 12.5 g via INTRAVENOUS
  Filled 2021-04-26: qty 250

## 2021-04-26 MED ORDER — DEXMEDETOMIDINE HCL IN NACL 400 MCG/100ML IV SOLN
INTRAVENOUS | Status: DC | PRN
  Administered 2021-04-26: .7 ug/kg/h via INTRAVENOUS

## 2021-04-26 MED ORDER — MONTELUKAST SODIUM 10 MG PO TABS
10.0000 mg | ORAL_TABLET | Freq: Every day | ORAL | Status: DC
  Administered 2021-04-27: 01:00:00 10 mg
  Filled 2021-04-26: qty 1

## 2021-04-26 MED ORDER — MIDAZOLAM HCL (PF) 10 MG/2ML IJ SOLN
INTRAMUSCULAR | Status: AC
  Filled 2021-04-26: qty 2

## 2021-04-26 MED ORDER — SODIUM CHLORIDE 0.9% FLUSH
10.0000 mL | Freq: Two times a day (BID) | INTRAVENOUS | Status: DC
  Administered 2021-04-26 – 2021-04-27 (×2): 20 mL
  Administered 2021-04-27 – 2021-04-28 (×3): 10 mL

## 2021-04-26 SURGICAL SUPPLY — 53 items
APPLICATOR TIP COSEAL (VASCULAR PRODUCTS) IMPLANT
BLADE CLIPPER SURG (BLADE) ×3 IMPLANT
BLADE SURG 11 STRL SS (BLADE) ×2 IMPLANT
CATH DIAG 6FR PIGTAIL (CATHETERS) ×2 IMPLANT
CATH DIAG EXPO 6F AL1 (CATHETERS) ×1 IMPLANT
CATH INFINITI 6F MPB2 (CATHETERS) ×1 IMPLANT
CLIP TI MEDIUM 24 (CLIP) ×2 IMPLANT
CLIP TI WIDE RED SMALL 24 (CLIP) ×2 IMPLANT
CONTAINER PROTECT SURGISLUSH (MISCELLANEOUS) ×1 IMPLANT
DRAPE C-ARM 42X72 X-RAY (DRAPES) ×4 IMPLANT
DRAPE CV SPLIT W-CLR ANES SCRN (DRAPES) ×3 IMPLANT
DRAPE INCISE IOBAN 66X45 STRL (DRAPES) ×2 IMPLANT
DRAPE PERI GROIN 82X75IN TIB (DRAPES) ×3 IMPLANT
DRAPE WARM FLUID 44X44 (DRAPES) ×3 IMPLANT
DRSG TEGADERM 4X4.75 (GAUZE/BANDAGES/DRESSINGS) ×6 IMPLANT
GAUZE SPONGE 4X4 12PLY STRL LF (GAUZE/BANDAGES/DRESSINGS) ×2 IMPLANT
GLOVE SURG ENC TEXT LTX SZ7.5 (GLOVE) ×9 IMPLANT
GLOVE SURG MICRO LTX SZ6 (GLOVE) ×4 IMPLANT
GLOVE SURG POLYISO LF SZ7.5 (GLOVE) ×4 IMPLANT
GOWN STRL REUS W/ TWL LRG LVL3 (GOWN DISPOSABLE) ×4 IMPLANT
GOWN STRL REUS W/TWL LRG LVL3 (GOWN DISPOSABLE) ×8
GRAFT HEMASHIELD 10MM (Graft) ×2 IMPLANT
GRAFT VASC STRG 30X10STRL (Graft) IMPLANT
INSERT FOGARTY SM (MISCELLANEOUS) ×5 IMPLANT
KIT BASIN OR (CUSTOM PROCEDURE TRAY) ×3 IMPLANT
LOOP VESSEL MAXI BLUE (MISCELLANEOUS) ×3 IMPLANT
LOOP VESSEL MINI RED (MISCELLANEOUS) ×3 IMPLANT
NS IRRIG 1000ML POUR BTL (IV SOLUTION) ×8 IMPLANT
PACK CHEST (CUSTOM PROCEDURE TRAY) ×3 IMPLANT
PAD ARMBOARD 7.5X6 YLW CONV (MISCELLANEOUS) ×6 IMPLANT
PAD ELECT DEFIB RADIOL ZOLL (MISCELLANEOUS) ×3 IMPLANT
PUMP SET IMPELLA 5.5 US (CATHETERS) ×3 IMPLANT
SEALANT SURG COSEAL 8ML (VASCULAR PRODUCTS) ×1 IMPLANT
SPONGE T-LAP 18X18 ~~LOC~~+RFID (SPONGE) ×2 IMPLANT
SUT ETHILON 3 0 PS 1 (SUTURE) ×6 IMPLANT
SUT MNCRL AB 3-0 PS2 18 (SUTURE) ×2 IMPLANT
SUT PROLENE 5 0 C1 (SUTURE) ×6 IMPLANT
SUT PROLENE 5 0 RB 2 (SUTURE) ×4 IMPLANT
SUT SILK  1 MH (SUTURE) ×6
SUT SILK 1 MH (SUTURE) ×4 IMPLANT
SUT SILK 1 TIES 10X30 (SUTURE) ×3 IMPLANT
SUT SILK 3 0 TIES 17X18 (SUTURE) ×2
SUT SILK 3-0 18XBRD TIE BLK (SUTURE) ×1 IMPLANT
SUT VIC AB 2-0 CT1 18 (SUTURE) ×4 IMPLANT
SUT VIC AB 2-0 CT1 27 (SUTURE) ×2
SUT VIC AB 2-0 CT1 TAPERPNT 27 (SUTURE) IMPLANT
SUT VIC AB 3-0 X1 27 (SUTURE) ×3 IMPLANT
SYR 30ML LL (SYRINGE) ×2 IMPLANT
TOWEL GREEN STERILE (TOWEL DISPOSABLE) ×3 IMPLANT
TOWEL GREEN STERILE FF (TOWEL DISPOSABLE) ×3 IMPLANT
TRAY FOLEY SLVR 16FR TEMP STAT (SET/KITS/TRAYS/PACK) ×3 IMPLANT
WATER STERILE IRR 1000ML POUR (IV SOLUTION) ×6 IMPLANT
WIRE EMERALD 3MM-J .035X260CM (WIRE) ×2 IMPLANT

## 2021-04-26 NOTE — Progress Notes (Signed)
ETT found at 27cm. Confirmed with RN ETT placement supposed to be 23cm. Withdrew tub to 23, xray ordered to confirm placement. Pt stable. Will continue to monitor.

## 2021-04-26 NOTE — Progress Notes (Signed)
°  Echocardiogram Echocardiogram Transesophageal has been performed.  Victor [name] 04/26/2021, 6:12 PM
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
## 2021-04-26 NOTE — Progress Notes (Deleted)
°  Echocardiogram 2D Echocardiogram has been performed.  Victor [name] 04/26/2021, 6:11 PM

## 2021-04-26 NOTE — Progress Notes (Signed)
°   °  [address]       [place] [address]             [phone]       No events  Vitals:   04/26/21 0600 04/26/21 0741  BP: 95/70   Pulse: (!) 103   Resp: 19   Temp:  98.2 F (36.8 C)  SpO2: 95%    Alert NAD Afib EWOB  OR today for right axillary artery cannulation, and impella placement  Victor [name] O Victor [name]

## 2021-04-26 NOTE — Anesthesia Procedure Notes (Signed)
Arterial Line Insertion Start/End12/21/2022 9:05 AM, 04/26/2021 9:10 AM Performed by: Rande Brunt, CRNA, CRNA  Patient location: Pre-op. Preanesthetic checklist: patient identified, IV checked, site marked, risks and benefits discussed, surgical consent, monitors and equipment checked, pre-op evaluation, timeout performed and anesthesia consent Lidocaine 1% used for infiltration Left, radial was placed Catheter size: 20 G Hand hygiene performed  and maximum sterile barriers used   Attempts: 1 Procedure performed without using ultrasound guided technique. Following insertion, dressing applied and Biopatch. Patient tolerated the procedure well with no immediate complications.

## 2021-04-26 NOTE — Progress Notes (Signed)
Patient ID: Victor [name], male   DOB: January 29, 1961, 60 y.o.   MRN: [id number]     Advanced Heart Failure Rounding Note  PCP-Cardiologist: Larae Grooms, MD  [facility]: Dr. [name]   Subjective:    12/14: Admitted w/ a/c CHF w/ low output and new Afib w/ RVR. Co-ox 38%. Placed on NE, milrinone and amio gtt.   Echo EF 20-25%, RV normal. Mod MR.  TEE-guided DCCV to NSR on 12/16, TEE with EF 20-25%, moderate to severe RV dysfunction, severe functional MR.    Remains in AF  Remains on milrinone and NE. Co-ox 46% CVP up.   Feels SOB.Weak. For Impella 5.5 today  Objective:   Weight Range: 122.5 kg Body mass index is 35.62 kg/m.   Vital Signs:   Temp:  [98.2 F (36.8 C)-99.4 F (37.4 C)] 98.3 F (36.8 C) (12/21 0823) Pulse Rate:  [33-110] 89 (12/21 1630) Resp:  [12-34] 19 (12/21 1630) BP: (71-126)/(57-90) 104/78 (12/21 1144) SpO2:  [87 %-97 %] 96 % (12/21 1630) FiO2 (%):  [60 %-100 %] 60 % (12/21 1740) Weight:  [122.5 kg] 122.5 kg (12/21 0823) Last BM Date: 04/24/21  Weight change: Filed Weights   04/25/21 0500 04/26/21 0500 04/26/21 0823  Weight: 124.5 kg 122.5 kg 122.5 kg    Intake/Output:   Intake/Output Summary (Last 24 hours) at 04/26/2021 1819 Last data filed at 04/26/2021 1639 Gross per 24 hour  Intake 4227.88 ml  Output 3925 ml  Net 302.88 ml       Physical Exam   General:  Sitting up in bed HEENT: normal Neck: supple. JVP up Carotids 2+ bilat; no bruits. No lymphadenopathy or thryomegaly appreciated. Cor: PMI nondisplaced. Irregular tachy  No rubs, gallops or murmurs. Lungs: clear Abdomen: obese soft, nontender, nondistended. No hepatosplenomegaly. No bruits or masses. Good bowel sounds. Extremities: no cyanosis, clubbing, rash, 1+ edema  LLE deformity Neuro: alert & orientedx3, cranial nerves grossly intact. moves all 4 extremities w/o difficulty. Affect pleasant   Telemetry    A fib RVR 100s with occasional PVCs.  Labs    CBC Recent Labs     04/25/21 0307 04/26/21 0258 04/26/21 1408 04/26/21 1426  WBC 10.5 8.2  --   --   HGB 15.3 14.3 16.3 16.0  HCT 45.9 43.4 48.0 47.0  MCV 89.5 88.9  --   --   PLT 136* 142*  --   --     Basic Metabolic Panel Recent Labs    04/25/21 0307 04/26/21 0258 04/26/21 0941 04/26/21 1408 04/26/21 1426  NA 130* 120* 129* 129* 130*  K 3.3* 3.4* 3.8 3.7 3.4*  CL 91* 79* 92*  --   --   CO2 26 25 24   --   --   GLUCOSE 245* 502* 144*  --   --   BUN 32* 36* 38*  --   --   CREATININE 1.97* 1.96* 2.01*  --   --   CALCIUM 8.7* 8.0* 9.3  --   --   MG 1.8 1.8  --   --   --     Liver Function Tests Recent Labs    04/23/21 1958 04/23/21 2136  AST 20 26  ALT 25 30  ALKPHOS 34* 41  BILITOT 1.6* 2.0*  PROT 5.9* 6.6  ALBUMIN 3.2* 3.6     No results for input(s): LIPASE, AMYLASE in the last 72 hours. Cardiac Enzymes No results for input(s): CKTOTAL, CKMB, CKMBINDEX, TROPONINI in the last 72 hours.  BNP: BNP (  last 3 results) Recent Labs    01/23/21 1104 03/08/21 1605 04/19/21 0954  BNP 119.8* 390.0* 154.2*     ProBNP (last 3 results) No results for input(s): PROBNP in the last 8760 hours.   D-Dimer No results for input(s): DDIMER in the last 72 hours.  Hemoglobin A1C No results for input(s): HGBA1C in the last 72 hours.  Fasting Lipid Panel Recent Labs    04/25/21 1516  CHOL 127  HDL 28*  LDLCALC 83  TRIG 80  CHOLHDL 4.5   Thyroid Function Tests Recent Labs    04/25/21 1516  TSH 1.292     Other results:   Imaging    DG CHEST PORT 1 VIEW  Result Date: 04/26/2021 CLINICAL DATA:  Check Impella placement, initial encounter EXAM: PORTABLE CHEST 1 VIEW COMPARISON:  04/24/2021 FINDINGS: Impella device is now seen extending from the right arm into the left ventricle. Endotracheal tube and Swan-Ganz catheter are noted in satisfactory position. Right-sided PICC line is noted extending into the right internal jugular vein. This is stable from the prior exam.  Lungs are well aerated bilaterally. Cardiac shadow remains enlarged. Central vascular congestion is noted. No bony abnormality is seen. No pneumothorax is noted. IMPRESSION: Status post Impella device placement in satisfactory position. Endotracheal tube and Swan-Ganz catheter are noted in satisfactory position. Right-sided PICC line courses into the right internal jugular vein. Electronically Signed   By: Inez Catalina M.D.   On: 04/26/2021 17:40   DG C-Arm 1-60 Min-No Report  Result Date: 04/26/2021 Fluoroscopy was utilized by the requesting physician.  No radiographic interpretation.     Medications:     Scheduled Medications:  alteplase  2 mg Intracatheter Once   Chlorhexidine Gluconate Cloth  6 each Topical Daily   digoxin  0.125 mg Oral Daily   docusate  100 mg Per Tube BID   insulin aspart  0-9 Units Subcutaneous Q4H   magnesium oxide  400 mg Oral Daily   mouth rinse  15 mL Mouth Rinse BID   midazolam       montelukast  10 mg Per Tube QHS   polyethylene glycol  17 g Per Tube Daily   potassium chloride  40 mEq Oral TID   pregabalin  150 mg Oral TID   sertraline  25 mg Oral Daily   sodium chloride flush  10-40 mL Intracatheter Q12H   sodium chloride flush  3 mL Intravenous Q12H   sugammadex sodium  2 mg/kg Intravenous Once    Infusions:  sodium chloride     sodium chloride 10 mL/hr at 04/26/21 0800   sodium chloride     amiodarone 60 mg/hr (04/26/21 1517)   ceFEPime (MAXIPIME) IV 0 g (04/25/21 2230)   dexmedetomidine (PRECEDEX) IV infusion 0.8 mcg/kg/hr (04/26/21 1817)   epinephrine     famotidine (PEPCID) IV     furosemide (LASIX) 200 mg in dextrose 5% 100 mL (2mg /mL) infusion 20 mg/hr (04/26/21 1159)   heparin 50 units/mL (Impella PURGE) in dextrose 5 % 1000 mL bag     heparin 1,850 Units/hr (04/26/21 1730)   milrinone 0.25 mcg/kg/min (04/26/21 1212)   norepinephrine 3 mcg/min (04/26/21 1813)   vancomycin Stopped (04/25/21 2337)    PRN Medications: sodium  chloride, Place/Maintain arterial line **AND** sodium chloride, albuterol, alum & mag hydroxide-simeth, fentaNYL (SUBLIMAZE) injection, fentaNYL (SUBLIMAZE) injection, guaiFENesin-dextromethorphan, HYDROmorphone (DILAUDID) injection, hydrOXYzine, phenol, sodium chloride flush, sodium chloride flush   Assessment/Plan   1. Acute on Chronic Systolic Heart Failure>>Cardiogenic Shock  -  diagnosed in 2014. Cath at that time normal cors with EF 20-25% - Echo 10/20 EF 20-25% RV ok. Moderate to severe MR - Cath 11/20 coronaries normal - Echo 01/23/21 EF 25% RV ok. Mild MR - CPX 06/2020 with overall moderate limitation due to CHF and body habitus.  pVO 14.2 slope 30 - Admitted w/ worsening symptoms,NYHA IV and low output in setting of new Afib w/ RVR. Initial Co-ox 38%>>Milrinone and NE added - Echo EF 20-25%, RV ok. Mod MR.  TEE EF 20-25%, moderate to severe RV dysfunction, severe functional MR.   - on Milrinone 0.25 + NE 3 Co-ox 56% . CVP  - TEE-guided DCCV to NSR 12/16 but back in atrial fibrillation with RVR shortly afterwards.  Remains in AF with RVR - Holding home Entresto/Farxiga w/ AKI and hypotension  - Holding ? blocker w/ hypotension and low output  - Remains in shock  - For Impella 5.5 today.  - Once stable consider transfer to [facility] for transplant    2. Atrial Fibrillation w/ RVR - New. Duration unknown.  - TFTs normal.  - C/w IV heparin for now until need for procedures ruled out  - TEE/DCCV to NSR 12/16 but back in AF with RVR 12/17, still in RVR this morning.  - Keep K > 4.0 and Mg > 2.0  - Continue amiodarone gtt 60 mg/hr    3. AKI due to ATN - Baseline SCr ~1.1-1.3 - SCr 2.1 -> 1.9->2 -> 1.96 - suspect 2/2 cardiorenal/low output - support hemodynamics   4. Polycythemia - Sleep study not overly impressive. - Being followed at [facility] for polycythemia. Work-up including JAK2 mutation and BMBx have been negative.  - Plan for therapeutic phlebotomy if HCT >= 54 (50  today) - starting a/c given new Afib    5. Mitral Regurgitation  - Severe functional MR on TEE.  If he stabilizes out, would repeat echo in NSR and consider Mitraclip if remains severe.   6. NSVT - monitor w/ dual inotropes  - continue amio gtt - Keep K > 4.0 and Mg >2.0  - Supp K and replace Mag   7. Hypokalemia - Supp K   8. Anxiety - Significant, on Xanax.   9. Fever -12/20 Blood Cx x 2  - Bcx NGTD.   -Continue empiric antibiotics with vancomycin and cefepime.   CRITICAL CARE Performed by: Glori Bickers  Total critical care time: 45 minutes  Critical care time was exclusive of separately billable procedures and treating other patients.  Critical care was necessary to treat or prevent imminent or life-threatening deterioration.  Critical care was time spent personally by me (independent of midlevel providers or residents) on the following activities: development of treatment plan with patient and/or surrogate as well as nursing, discussions with consultants, evaluation of patient's response to treatment, examination of patient, obtaining history from patient or surrogate, ordering and performing treatments and interventions, ordering and review of laboratory studies, ordering and review of radiographic studies, pulse oximetry and re-evaluation of patient's condition.  Glori Bickers, MD

## 2021-04-26 NOTE — Op Note (Signed)
°   °  [address]       [place] [address]             [phone]                                           04/26/2021 Patient:  Victor [name] Pre-Op Dx:  Cardiogenic Shock   Atrial fibrillation Post-op Dx: Same Procedure: Right axillary artery cannulation with 10 mm graft Insertion of 5.5 Impella (Performed by Dr. Jeffie Pollock)  Surgeon and Role:      * Heidemarie Goodnow, Lucile Crater, MD - Primary    * Dr. Jeffie Pollock, MD - Co-Surgeon   Anesthesia  general EBL:  500 ml Blood Administration: none  Counts: correct   Indications: This is a 60 year old gentleman who was admitted with nonischemic cardiomyopathy with an EF of 20%.  He also has a history of A. fib with RVR and is failed cardioversion.  He continues to be in cardiogenic shock we have been asked to assist with Impella placement.  Findings: Very deep artery.  We were able to sew on the 10 mm graft.  The anastomosis was hemostatic.  Operative Technique: All invasive lines were placed in pre-op holding.  After the risks, benefits and alternatives were thoroughly discussed, the patient was brought to the operative theatre.  Anesthesia was induced, and the patient was prepped and draped in normal sterile fashion.  An appropriate surgical pause was performed, and pre-operative antibiotics were dosed accordingly.  We began with an incision over the right deltopectoral groove.  This was carried down with a combination of Bovie cautery and blunt dissection until we reached the right axillary artery.  Patient was then systemically heparinized, and the axillary artery was encircled and clamped.  An arteriotomy was created and an 10 mm graft was sewn to the axillary artery in an end-to-side fashion.  This would serve as our arterial cannulation site.  The peel away sheath was inserted into the end of the graft and fastened with the sleeve connectors to provide hemostasis. A .035 J-wire was advanced through the sheath into  the ascending aorta under fluoroscopic guidance usin C-arm. A pig tail catheter was inserted over the wire and then advanced across the aortic valve without difficulty to the LV apex. The J-wire was changed out for 0.18 wire and it was positioned with a gentle curve in the LV apex. The pigtail was removed. Then the graft was clamped adjacent to the anastomosis and the 5.5 Impella inserted through the sheath. Before full insertion it was burped by removing the clamp on the graft temporarily. The Impella was advanced fully into the graft and the clamp removed. The Impella was advanced into the LV and the wire removed. Position was confirmed with TEE with the tip 5.6 cm below the aortic valve. The graft was again clamped and the graft was cut flush with the skin. The sheath was inserted into the end of the graft and it was fastened with 1-0 silk ties for hemostasis. The sheath was attached to the skin with 0-silk sutures.  The wound was hemostatic.  The soft tissue and skin were re-approximated wth absorbable suture.    The patient tolerated the procedure without any immediate complications, and was transferred to the ICU in guarded condition.  [name]

## 2021-04-26 NOTE — Progress Notes (Signed)
This chaplain responded to PMT consult for building a spiritual care relationship with the Pt.  The Pt. is not in the room at the time of the visit.  This chaplain will return on Thursday.  Chaplain Sallyanne Kuster [phone]

## 2021-04-26 NOTE — Anesthesia Procedure Notes (Signed)
Central Venous Catheter Insertion Performed by: Roderic Palau, MD, anesthesiologist Start/End12/21/2022 9:10 AM, 04/26/2021 9:25 AM Patient location: Pre-op. Preanesthetic checklist: patient identified, IV checked, site marked, risks and benefits discussed, surgical consent, monitors and equipment checked, pre-op evaluation, timeout performed and anesthesia consent Position: Trendelenburg Lidocaine 1% used for infiltration and patient sedated Hand hygiene performed , maximum sterile barriers used  and Seldinger technique used Catheter size: 9 Fr Total catheter length 10. Central line was placed.MAC introducer Procedure performed using ultrasound guided technique. Ultrasound Notes:anatomy identified, needle tip was noted to be adjacent to the nerve/plexus identified, no ultrasound evidence of intravascular and/or intraneural injection and image(s) printed for medical record Attempts: 1 Following insertion, line sutured, dressing applied and Biopatch. Post procedure assessment: blood return through all ports, free fluid flow and no air  Patient tolerated the procedure well with no immediate complications.

## 2021-04-26 NOTE — Anesthesia Procedure Notes (Signed)
Central Venous Catheter Insertion Performed by: Roderic Palau, MD, anesthesiologist Start/End12/21/2022 9:10 AM, 04/26/2021 9:25 AM Patient location: Pre-op. Preanesthetic checklist: patient identified, IV checked, site marked, risks and benefits discussed, surgical consent, monitors and equipment checked, pre-op evaluation, timeout performed and anesthesia consent Hand hygiene performed  and maximum sterile barriers used  PA cath was placed.Swan type:thermodilution PA Cath depth:50 Procedure performed without using ultrasound guided technique. Attempts: 1 Patient tolerated the procedure well with no immediate complications.

## 2021-04-26 NOTE — Anesthesia Procedure Notes (Signed)
Procedure Name: Intubation Date/Time: 04/26/2021 12:22 PM Performed by: Colin Benton, CRNA Pre-anesthesia Checklist: Patient identified, Emergency Drugs available, Suction available and Patient being monitored Patient Re-evaluated:Patient Re-evaluated prior to induction Oxygen Delivery Method: Circle system utilized Preoxygenation: Pre-oxygenation with 100% oxygen Induction Type: IV induction Ventilation: Mask ventilation without difficulty and Oral airway inserted - appropriate to patient size Laryngoscope Size: Mac and 3 Grade View: Grade II Tube type: Oral Tube size: 8.0 mm Number of attempts: 1 Airway Equipment and Method: Stylet Placement Confirmation: ETT inserted through vocal cords under direct vision, positive ETCO2 and breath sounds checked- equal and bilateral Secured at: 23 cm Tube secured with: Tape Dental Injury: Teeth and Oropharynx as per pre-operative assessment

## 2021-04-26 NOTE — Progress Notes (Signed)
[facility] Progress Note Patient Name: Victor [name] DOB: 17-Sep-1960 MRN: [id number]   Date of Service  04/26/2021  HPI/Events of Note  Hyperglycemia - Blood glucose = 260, 248 and 239.  eICU Interventions  Plan: D/C Q 4 hour sensitive Novolog SSI. Insulin IV infusion via EndoTool protocol.  Transition back to Q 4 hours Novolog SSI in AM if possible.      Intervention Category Major Interventions: Hyperglycemia - active titration of insulin therapy  Lysle Dingwall 04/26/2021, 8:26 PM

## 2021-04-26 NOTE — Anesthesia Postprocedure Evaluation (Signed)
Anesthesia Post Note  Patient: Victor [name]  Procedure(s) Performed: PLACEMENT OF IMPELLA 5.5 LEFT VENTRICULAR ASSIST DEVICE TRANSESOPHAGEAL ECHOCARDIOGRAM (TEE)     Patient location during evaluation: SICU Anesthesia Type: General Level of consciousness: sedated Pain management: pain level controlled Vital Signs Assessment: post-procedure vital signs reviewed and stable Respiratory status: patient remains intubated per anesthesia plan Cardiovascular status: stable Postop Assessment: no apparent nausea or vomiting Anesthetic complications: no   No notable events documented.  Last Vitals:  Vitals:   04/26/21 1155 04/26/21 1630  BP:    Pulse: 89 89  Resp: 17 19  Temp:    SpO2: 94% 96%    Last Pain:  Vitals:   04/26/21 0823  TempSrc: Oral  PainSc:                  Emilee Market,W. EDMOND
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
## 2021-04-26 NOTE — Progress Notes (Signed)
° °  Patient underwent Impella 5.5 placement today in OR with Dr. Kipp Brood.   On returning to ICU was on high dose pressors. With ectopy. Impella placement checked and looks good.   Impella speed turned down transiently and improved. Able to turn impella back up.   Lasix gtt stopped  On vent. Twitching on exam. Now on precedex  Discussed with Dr. Lynetta Mare. Will keep sedated overnight.   Check labs.   Continue heparin.   Discussed with wife at bedside.   Additional CCT 1 hour  Glori Bickers, MD  6:26 PM

## 2021-04-26 NOTE — Anesthesia Postprocedure Evaluation (Signed)
Anesthesia Post Note  Patient: Granite Godman  Procedure(s) Performed: CARDIOVERSION     Patient location during evaluation: Endoscopy Anesthesia Type: General Level of consciousness: awake and alert Pain management: pain level controlled Vital Signs Assessment: post-procedure vital signs reviewed and stable Respiratory status: spontaneous breathing, nonlabored ventilation, respiratory function stable and patient connected to nasal cannula oxygen Cardiovascular status: blood pressure returned to baseline and stable Postop Assessment: no apparent nausea or vomiting Anesthetic complications: no   No notable events documented.  Last Vitals:  Vitals:   04/26/21 1155 04/26/21 1630  BP:    Pulse: 89 89  Resp: 17 19  Temp:    SpO2: 94% 96%    Last Pain:  Vitals:   04/26/21 0823  TempSrc: Oral  PainSc:    Pain Goal: Patients Stated Pain Goal: 0 (04/25/21 1600)                 Nikolaus Pienta S

## 2021-04-26 NOTE — Transfer of Care (Signed)
Immediate Anesthesia Transfer of Care Note  Patient: Victor [name]  Procedure(s) Performed: PLACEMENT OF IMPELLA 5.5 LEFT VENTRICULAR ASSIST DEVICE TRANSESOPHAGEAL ECHOCARDIOGRAM (TEE)  Patient Location: ICU  Anesthesia Type:General  Level of Consciousness: sedated and Patient remains intubated per anesthesia plan  Airway & Oxygen Therapy: Patient remains intubated per anesthesia plan and Patient placed on Ventilator (see vital sign flow sheet for setting)  Post-op Assessment: Report given to RN and Post -op Vital signs reviewed and stable  Post vital signs: Reviewed and stable  Last Vitals:  Vitals Value Taken Time  BP 90/66 04/26/21 1630  Temp 35.8 C 04/26/21 1641  Pulse 75 04/26/21 1641  Resp 17 04/26/21 1641  SpO2 96 % 04/26/21 1641  Vitals shown include unvalidated device data.  Last Pain:  Vitals:   04/26/21 0823  TempSrc: Oral  PainSc:       Patients Stated Pain Goal: 0 (19/50/93 2671)  Complications: No notable events documented.

## 2021-04-26 NOTE — Progress Notes (Signed)
[facility] Progress Note Patient Name: Victor [name] DOB: [birth year] MRN: [id number]   Date of Service  04/26/2021  HPI/Events of Note  Hypokalemia - K+ = 3.4 and Creatinine = 2.28. Had been on a Lasix IV infusion which is not stopped.   eICU Interventions  Will replace K+.      Intervention Category Major Interventions: Electrolyte abnormality - evaluation and management  Lysle Dingwall 04/26/2021, 9:48 PM

## 2021-04-27 ENCOUNTER — Encounter (HOSPITAL_COMMUNITY): Payer: Self-pay | Admitting: Thoracic Surgery (Cardiothoracic Vascular Surgery)

## 2021-04-27 ENCOUNTER — Inpatient Hospital Stay (HOSPITAL_COMMUNITY): Payer: Medicare Other

## 2021-04-27 DIAGNOSIS — Z01810 Encounter for preprocedural cardiovascular examination: Secondary | ICD-10-CM

## 2021-04-27 LAB — CBC
HCT: 42.3 % (ref 39.0–52.0)
Hemoglobin: 14.2 g/dL (ref 13.0–17.0)
MCH: 29.6 pg (ref 26.0–34.0)
MCHC: 33.6 g/dL (ref 30.0–36.0)
MCV: 88.1 fL (ref 80.0–100.0)
Platelets: 175 10*3/uL (ref 150–400)
RBC: 4.8 MIL/uL (ref 4.22–5.81)
RDW: 13 % (ref 11.5–15.5)
WBC: 16.8 10*3/uL — ABNORMAL HIGH (ref 4.0–10.5)
nRBC: 0 % (ref 0.0–0.2)

## 2021-04-27 LAB — POCT I-STAT 7, (LYTES, BLD GAS, ICA,H+H)
Acid-Base Excess: 1 mmol/L (ref 0.0–2.0)
Acid-base deficit: 1 mmol/L (ref 0.0–2.0)
Bicarbonate: 24.8 mmol/L (ref 20.0–28.0)
Bicarbonate: 24.9 mmol/L (ref 20.0–28.0)
Calcium, Ion: 1.2 mmol/L (ref 1.15–1.40)
Calcium, Ion: 1.17 mmol/L (ref 1.15–1.40)
HCT: 44 % (ref 39.0–52.0)
HCT: 44 % (ref 39.0–52.0)
Hemoglobin: 15 g/dL (ref 13.0–17.0)
Hemoglobin: 15 g/dL (ref 13.0–17.0)
O2 Saturation: 98 %
O2 Saturation: 95 %
Patient temperature: 38.8
Patient temperature: 38.8
Potassium: 4.8 mmol/L (ref 3.5–5.1)
Potassium: 4.4 mmol/L (ref 3.5–5.1)
Sodium: 128 mmol/L — ABNORMAL LOW (ref 135–145)
Sodium: 128 mmol/L — ABNORMAL LOW (ref 135–145)
TCO2: 26 mmol/L (ref 22–32)
TCO2: 26 mmol/L (ref 22–32)
pCO2 arterial: 48.5 mmHg — ABNORMAL HIGH (ref 32.0–48.0)
pCO2 arterial: 40.2 mmHg (ref 32.0–48.0)
pH, Arterial: 7.325 — ABNORMAL LOW (ref 7.350–7.450)
pH, Arterial: 7.408 (ref 7.350–7.450)
pO2, Arterial: 120 mmHg — ABNORMAL HIGH (ref 83.0–108.0)
pO2, Arterial: 82 mmHg — ABNORMAL LOW (ref 83.0–108.0)

## 2021-04-27 LAB — GLUCOSE, CAPILLARY
Glucose-Capillary: 104 mg/dL — ABNORMAL HIGH (ref 70–99)
Glucose-Capillary: 108 mg/dL — ABNORMAL HIGH (ref 70–99)
Glucose-Capillary: 127 mg/dL — ABNORMAL HIGH (ref 70–99)
Glucose-Capillary: 127 mg/dL — ABNORMAL HIGH (ref 70–99)
Glucose-Capillary: 128 mg/dL — ABNORMAL HIGH (ref 70–99)
Glucose-Capillary: 129 mg/dL — ABNORMAL HIGH (ref 70–99)
Glucose-Capillary: 129 mg/dL — ABNORMAL HIGH (ref 70–99)
Glucose-Capillary: 130 mg/dL — ABNORMAL HIGH (ref 70–99)
Glucose-Capillary: 134 mg/dL — ABNORMAL HIGH (ref 70–99)
Glucose-Capillary: 143 mg/dL — ABNORMAL HIGH (ref 70–99)
Glucose-Capillary: 148 mg/dL — ABNORMAL HIGH (ref 70–99)
Glucose-Capillary: 156 mg/dL — ABNORMAL HIGH (ref 70–99)
Glucose-Capillary: 158 mg/dL — ABNORMAL HIGH (ref 70–99)
Glucose-Capillary: 171 mg/dL — ABNORMAL HIGH (ref 70–99)
Glucose-Capillary: 179 mg/dL — ABNORMAL HIGH (ref 70–99)
Glucose-Capillary: 210 mg/dL — ABNORMAL HIGH (ref 70–99)

## 2021-04-27 LAB — COMPREHENSIVE METABOLIC PANEL
ALT: 26 U/L (ref 0–44)
AST: 29 U/L (ref 15–41)
Albumin: 3.5 g/dL (ref 3.5–5.0)
Alkaline Phosphatase: 30 U/L — ABNORMAL LOW (ref 38–126)
Anion gap: 11 (ref 5–15)
BUN: 38 mg/dL — ABNORMAL HIGH (ref 6–20)
CO2: 23 mmol/L (ref 22–32)
Calcium: 8.6 mg/dL — ABNORMAL LOW (ref 8.9–10.3)
Chloride: 94 mmol/L — ABNORMAL LOW (ref 98–111)
Creatinine, Ser: 1.98 mg/dL — ABNORMAL HIGH (ref 0.61–1.24)
GFR, Estimated: 38 mL/min — ABNORMAL LOW (ref >60)
Glucose, Bld: 123 mg/dL — ABNORMAL HIGH (ref 70–99)
Potassium: 4.8 mmol/L (ref 3.5–5.1)
Sodium: 128 mmol/L — ABNORMAL LOW (ref 135–145)
Total Bilirubin: 2.2 mg/dL — ABNORMAL HIGH (ref 0.3–1.2)
Total Protein: 6.7 g/dL (ref 6.5–8.1)

## 2021-04-27 LAB — COOXEMETRY PANEL
Carboxyhemoglobin: 0.8 % (ref 0.5–1.5)
Carboxyhemoglobin: 1 % (ref 0.5–1.5)
Carboxyhemoglobin: 1.1 % (ref 0.5–1.5)
Carboxyhemoglobin: 1.3 % (ref 0.5–1.5)
Methemoglobin: 0.6 % (ref 0.0–1.5)
Methemoglobin: 0.7 % (ref 0.0–1.5)
Methemoglobin: 0.7 % (ref 0.0–1.5)
Methemoglobin: 0.7 % (ref 0.0–1.5)
O2 Saturation: 54.5 %
O2 Saturation: 55.6 %
O2 Saturation: 63 %
O2 Saturation: 81.3 %
Total hemoglobin: 13.8 g/dL (ref 12.0–16.0)
Total hemoglobin: 14.1 g/dL (ref 12.0–16.0)
Total hemoglobin: 14.1 g/dL (ref 12.0–16.0)
Total hemoglobin: 14.3 g/dL (ref 12.0–16.0)

## 2021-04-27 LAB — LACTATE DEHYDROGENASE: LDH: 287 U/L — ABNORMAL HIGH (ref 98–192)

## 2021-04-27 LAB — LACTIC ACID, PLASMA
Lactic Acid, Venous: 1.8 mmol/L (ref 0.5–1.9)
Lactic Acid, Venous: 3.8 mmol/L (ref 0.5–1.9)

## 2021-04-27 LAB — HEPATITIS B SURFACE ANTIBODY, QUANTITATIVE: Hep B S AB Quant (Post): 266.2 m[IU]/mL (ref >9.9)

## 2021-04-27 LAB — HEPARIN LEVEL (UNFRACTIONATED): Heparin Unfractionated: 0.89 IU/mL — ABNORMAL HIGH (ref 0.30–0.70)

## 2021-04-27 LAB — MAGNESIUM: Magnesium: 1.9 mg/dL (ref 1.7–2.4)

## 2021-04-27 MED ORDER — POLYETHYLENE GLYCOL 3350 17 G PO PACK
17.0000 g | PACK | Freq: Every day | ORAL | Status: DC
  Administered 2021-04-28 – 2021-04-29 (×2): 17 g via ORAL
  Filled 2021-04-27 (×2): qty 1

## 2021-04-27 MED ORDER — SODIUM CHLORIDE 0.9 % IV SOLN
1.0000 g | Freq: Three times a day (TID) | INTRAVENOUS | Status: DC
  Administered 2021-04-27 – 2021-04-29 (×7): 1 g via INTRAVENOUS
  Filled 2021-04-27 (×9): qty 1

## 2021-04-27 MED ORDER — HYDROXYZINE HCL 25 MG PO TABS: 25.0000 mg | ORAL_TABLET | Freq: Three times a day (TID) | ORAL | Status: DC | PRN

## 2021-04-27 MED ORDER — PREGABALIN 75 MG PO CAPS
150.0000 mg | ORAL_CAPSULE | Freq: Three times a day (TID) | ORAL | Status: DC
  Administered 2021-04-27 – 2021-04-29 (×6): 150 mg via ORAL
  Filled 2021-04-27 (×6): qty 2

## 2021-04-27 MED ORDER — DIGOXIN 125 MCG PO TABS
0.1250 mg | ORAL_TABLET | Freq: Every day | ORAL | Status: DC
  Administered 2021-04-28 – 2021-04-29 (×2): 0.125 mg via ORAL
  Filled 2021-04-27 (×2): qty 1

## 2021-04-27 MED ORDER — PREGABALIN 75 MG PO CAPS: 150.0000 mg | ORAL_CAPSULE | Freq: Three times a day (TID) | ORAL | Status: DC

## 2021-04-27 MED ORDER — MAGNESIUM OXIDE -MG SUPPLEMENT 400 (240 MG) MG PO TABS
400.0000 mg | ORAL_TABLET | Freq: Every day | ORAL | Status: DC
  Administered 2021-04-28 – 2021-04-29 (×2): 400 mg via ORAL
  Filled 2021-04-27 (×2): qty 1

## 2021-04-27 MED ORDER — MONTELUKAST SODIUM 10 MG PO TABS
10.0000 mg | ORAL_TABLET | Freq: Every day | ORAL | Status: DC
  Administered 2021-04-27 – 2021-04-28 (×2): 10 mg via ORAL
  Filled 2021-04-27 (×2): qty 1

## 2021-04-27 MED ORDER — SERTRALINE HCL 50 MG PO TABS
25.0000 mg | ORAL_TABLET | Freq: Every day | ORAL | Status: DC
  Administered 2021-04-27: 10:00:00 25 mg
  Filled 2021-04-27: qty 1

## 2021-04-27 MED ORDER — ADULT MULTIVITAMIN W/MINERALS CH
1.0000 | ORAL_TABLET | Freq: Every day | ORAL | Status: DC
  Administered 2021-04-28 – 2021-04-29 (×2): 1 via ORAL
  Filled 2021-04-27 (×2): qty 1

## 2021-04-27 MED ORDER — ACETAMINOPHEN 650 MG RE SUPP
650.0000 mg | RECTAL | Status: DC | PRN
  Administered 2021-04-27: 08:00:00 650 mg via RECTAL

## 2021-04-27 MED ORDER — ACETAMINOPHEN 325 MG PO TABS
650.0000 mg | ORAL_TABLET | Freq: Four times a day (QID) | ORAL | Status: DC | PRN
  Administered 2021-04-27 – 2021-04-28 (×4): 650 mg via ORAL
  Filled 2021-04-27 (×4): qty 2

## 2021-04-27 MED ORDER — DOCUSATE SODIUM 100 MG PO CAPS
100.0000 mg | ORAL_CAPSULE | Freq: Two times a day (BID) | ORAL | Status: DC
  Administered 2021-04-27 – 2021-04-29 (×4): 100 mg via ORAL
  Filled 2021-04-27 (×4): qty 1

## 2021-04-27 MED ORDER — GUAIFENESIN-DM 100-10 MG/5ML PO SYRP: 15.0000 mL | ORAL_SOLUTION | ORAL | Status: DC | PRN

## 2021-04-27 MED ORDER — SERTRALINE HCL 50 MG PO TABS
25.0000 mg | ORAL_TABLET | Freq: Every day | ORAL | Status: DC
  Administered 2021-04-28 – 2021-04-29 (×2): 25 mg via ORAL
  Filled 2021-04-27 (×2): qty 1

## 2021-04-27 MED ORDER — DIGOXIN 125 MCG PO TABS
0.1250 mg | ORAL_TABLET | Freq: Every day | ORAL | Status: DC
  Administered 2021-04-27: 10:00:00 0.125 mg
  Filled 2021-04-27: qty 1

## 2021-04-27 MED ORDER — TRAMADOL HCL 50 MG PO TABS
50.0000 mg | ORAL_TABLET | Freq: Four times a day (QID) | ORAL | Status: DC | PRN
  Administered 2021-04-27 – 2021-04-28 (×4): 50 mg via ORAL
  Filled 2021-04-27 (×4): qty 1

## 2021-04-27 MED ORDER — MAGNESIUM SULFATE 2 GM/50ML IV SOLN
2.0000 g | Freq: Once | INTRAVENOUS | Status: AC
  Administered 2021-04-27: 08:00:00 2 g via INTRAVENOUS
  Filled 2021-04-27: qty 50

## 2021-04-27 MED ORDER — GUAIFENESIN-DM 100-10 MG/5ML PO SYRP
15.0000 mL | ORAL_SOLUTION | ORAL | Status: DC | PRN
  Administered 2021-04-27: 21:00:00 15 mL via ORAL
  Filled 2021-04-27: qty 15

## 2021-04-27 MED ORDER — FUROSEMIDE 10 MG/ML IJ SOLN
80.0000 mg | Freq: Two times a day (BID) | INTRAMUSCULAR | Status: DC
  Administered 2021-04-27: 08:00:00 80 mg via INTRAVENOUS
  Filled 2021-04-27 (×2): qty 8

## 2021-04-27 MED ORDER — ORAL CARE MOUTH RINSE
15.0000 mL | Freq: Two times a day (BID) | OROMUCOSAL | Status: DC
  Administered 2021-04-27 – 2021-04-28 (×3): 15 mL via OROMUCOSAL

## 2021-04-27 MED ORDER — MAGNESIUM OXIDE -MG SUPPLEMENT 400 (240 MG) MG PO TABS
400.0000 mg | ORAL_TABLET | Freq: Every day | ORAL | Status: DC
  Administered 2021-04-27: 10:00:00 400 mg
  Filled 2021-04-27: qty 1

## 2021-04-27 MED ORDER — FAMOTIDINE 20 MG PO TABS
20.0000 mg | ORAL_TABLET | Freq: Two times a day (BID) | ORAL | Status: DC
  Administered 2021-04-27 – 2021-04-29 (×4): 20 mg via ORAL
  Filled 2021-04-27 (×4): qty 1

## 2021-04-27 MED ORDER — FAMOTIDINE 40 MG/5ML PO SUSR
20.0000 mg | Freq: Two times a day (BID) | ORAL | Status: DC
Start: 2021-04-27 — End: 2021-04-27
  Filled 2021-04-27: qty 2.5

## 2021-04-27 MED ORDER — ENSURE ENLIVE PO LIQD
237.0000 mL | Freq: Two times a day (BID) | ORAL | Status: DC
  Administered 2021-04-28 – 2021-04-29 (×3): 237 mL via ORAL

## 2021-04-27 MED ORDER — HYDROXYZINE HCL 25 MG PO TABS
25.0000 mg | ORAL_TABLET | Freq: Three times a day (TID) | ORAL | Status: DC | PRN
  Administered 2021-04-27 – 2021-04-29 (×5): 25 mg via ORAL
  Filled 2021-04-27 (×5): qty 1

## 2021-04-27 MED ORDER — INSULIN ASPART 100 UNIT/ML IJ SOLN
3.0000 [IU] | INTRAMUSCULAR | Status: DC
Start: 2021-04-27 — End: 2021-04-29
  Administered 2021-04-27: 20:00:00 6 [IU] via SUBCUTANEOUS
  Administered 2021-04-27 (×2): 3 [IU] via SUBCUTANEOUS
  Administered 2021-04-28 – 2021-04-29 (×5): 6 [IU] via SUBCUTANEOUS
  Administered 2021-04-29 (×2): 3 [IU] via SUBCUTANEOUS

## 2021-04-27 MED ORDER — INSULIN DETEMIR 100 UNIT/ML ~~LOC~~ SOLN
8.0000 [IU] | Freq: Two times a day (BID) | SUBCUTANEOUS | Status: DC
  Administered 2021-04-27 – 2021-04-29 (×5): 8 [IU] via SUBCUTANEOUS
  Filled 2021-04-27 (×6): qty 0.08

## 2021-04-27 MED ORDER — ACETAMINOPHEN 160 MG/5ML PO SOLN
650.0000 mg | Freq: Four times a day (QID) | ORAL | Status: DC | PRN
  Administered 2021-04-27: 04:00:00 650 mg
  Filled 2021-04-27: qty 20.3

## 2021-04-27 NOTE — Progress Notes (Signed)
Lower extremity venous and carotid duplex for pre-VAD work up attempted. Come back later per RN. Will attempt again as schedule and patient availability permits.   Darlin Coco, RDMS, RVT

## 2021-04-27 NOTE — Progress Notes (Signed)
20 minutes of manual pressure held at impella site for small hematoma. Site is soft. RN will continue to monitor patient closely.

## 2021-04-27 NOTE — Progress Notes (Signed)
Initial Nutrition Assessment  DOCUMENTATION CODES:   Obesity unspecified  INTERVENTION:   -Once diet is advanced, add:   -Ensure Enlive po BID, each supplement provides 350 kcal and 20 grams of protein  -MVI with minerals daily  NUTRITION DIAGNOSIS:   Increased nutrient needs related to chronic illness (CHF) as evidenced by estimated needs.  GOAL:   Patient will meet greater than or equal to 90% of their needs  MONITOR:   PO intake, Supplement acceptance, Diet advancement, Labs, Weight trends, Skin, I & O's  REASON FOR ASSESSMENT:   Consult LVAD Eval  ASSESSMENT:   Victor [name] is a 60 y.o. male with systolic HF due to NICM EF 20-25%, HTN, HL and obesity who is referred by Estella Husk PA-C for further evaluation of HF.  Pt admitted with CHF, concern for low output, and new a-fib with RVR.   12/14- PICC line 12/16- s.p TEE with cardioversion 12/19- s/p cardioversion 12/21- intubated, s/p impella placement 12/22- extubated  Reviewed I/O's: +3.3 L x 24 hours and +5.3 L since admission  UOP: 2.1 L x 24 hours   Per CVTS notes, plan for possible transfer to [facility] for transplant. Per pt, he does not desire to be re-intubated.   Pt unavailable at time of visit. RD unable to obtain further nutrition-related history or complete nutrition-focused physical exam at this time.    Case discussed with RN, who reports pt was just recently extubated. Pt currently NPO, but RN reports she will try to advance pt to diet today if able.   Pt previously on a heart healthy, carb modified diet and was consuming 75-100% of meals. Pt does not eat beef or pork.   Reviewed wt hx; wt has been stable over the past 3 months.   Pt with increased nutritional needs and would benefit from addition of oral nutrition supplements with diet advancement. He is at risk for malnutrition, however, unable to identify at this time.   Palliative care consult pending for goals of care discussions.    Medications reviewed and include colace, lasix, miralax, amiodarone, precedex, adrenalin, insulin drip, primacor, levophed, and pitressin.   Lab Results  Component Value Date   HGBA1C 5.7 (H) 04/26/2021   PTA DM medications are .   Labs reviewed: CBGS: 104-134 (inpatient orders for glycemic control are 3-9 units insulin aspart every 4 hours and 8 units insulin determir every 12 hours).    Diet Order:   Diet Order             Diet NPO time specified  Diet effective now                   EDUCATION NEEDS:   No education needs have been identified at this time  Skin:  Skin Assessment: Reviewed RN Assessment  Last BM:  04/24/21  Height:   Ht Readings from Last 1 Encounters:  04/26/21 6\' 1"  (1.854 m)    Weight:   Wt Readings from Last 1 Encounters:  04/27/21 127.8 kg    Ideal Body Weight:  83.6 kg  BMI:  Body mass index is 37.17 kg/m.  Estimated Nutritional Needs:   Kcal:  2300-2500  Protein:  125-150 grams  Fluid:  > 2 L    Loistine Chance, RD, LDN, [place] Registered Dietitian II Certified Diabetes Care and Education Specialist Please refer to [facility] for RD and/or RD on-call/weekend/after hours pager

## 2021-04-27 NOTE — Progress Notes (Signed)
Providers in room at time this RN was going to remove PICC line. Received VO from provider to leave PICC line in place at this time.

## 2021-04-27 NOTE — Progress Notes (Signed)
Lower extremity venous bilateral and Pre-VAD ultrasound study completed.   Please see CV Proc for preliminary results.   Darlin Coco, RDMS, RVT

## 2021-04-27 NOTE — Procedures (Signed)
Extubation Procedure Note  Patient Details:   Name: Victor [name] DOB: 1961-02-22 MRN: [id number]   Airway Documentation: + cuff leak test prior to extubation.  VC was 1300 ml.  Per CCM, extubate pt.     Vent end date: 04/27/21 Vent end time: 1115   Evaluation  O2 sats: stable throughout Complications: No apparent complications Patient did tolerate procedure well. Bilateral Breath Sounds: Diminished, Clear   Yes, pt able to speak.  No stridor noted, no distress noted.  Per Pt, states his breathing is "good".    Lenna Sciara 04/27/2021, 11:21 AM

## 2021-04-27 NOTE — Progress Notes (Signed)
Patient's nurse was present when VO received from Dr. [name] to leave PICC line in place.

## 2021-04-27 NOTE — Progress Notes (Signed)
ANTICOAGULATION CONSULT NOTE - Follow Up Consult  Pharmacy Consult for heparin Indication:  Impella  Labs: Recent Labs    04/25/21 0307 04/25/21 1155 04/25/21 1516 04/26/21 0258 04/26/21 0259 04/26/21 0941 04/26/21 1408 04/26/21 1426 04/26/21 1903 04/27/21 0129  HGB 15.3  --   --  14.3  --   --  16.3 16.0 14.7  --   HCT 45.9  --   --  43.4  --   --  48.0 47.0 44.3  --   PLT 136*  --   --  142*  --   --   --   --  183  --   APTT  --   --  88*  --   --   --   --   --   --   --   LABPROT  --   --  16.7*  --   --   --   --   --  16.1*  --   INR  --   --  1.4*  --   --   --   --   --  1.3*  --   HEPARINUNFRC 0.19* 0.36  --   --  0.30  --   --   --   --  0.89*  CREATININE 1.97*  --   --  1.96*  --  2.01*  --   --  2.28*  --     Assessment: 60yo male supratherapeutic on heparin after Impella placed, systemic heparin adjusted to account for Impella heparin purge; no infusion issues or signs of bleeding per RN.  Goal of Therapy:  Heparin level 0.2-0.5 units/ml   Plan:  Will decrease heparin infusion by 3 units/kg/hr to 1100 units/hr and check level in 8 hours.    Wynona Neat, PharmD, BCPS  04/27/2021,2:55 AM

## 2021-04-27 NOTE — Progress Notes (Signed)
Hematoma at site continues to be an issue. Manual pressure held.  Blood tinged urine this hour. Pharm MD aware.

## 2021-04-27 NOTE — Progress Notes (Signed)
°   °  [address]       [place] [address]             [phone]       No events Cannulation site hemostatic Warm right hand  Weaning to extubation Titration medication  Kinney Sackmann O Cylus [place]

## 2021-04-27 NOTE — Progress Notes (Addendum)
Patient ID: Victor [name], male   DOB: Nov 03, 1960, 60 y.o.   MRN: [id number]     Advanced Heart Failure Rounding Note  PCP-Cardiologist: Larae Grooms, MD  [facility]: Dr. [name]   Subjective:    12/14: Admitted w/ a/c CHF w/ low output and new Afib w/ RVR. Co-ox 38%. Placed on NE, milrinone and amio gtt.   Echo EF 20-25%, RV normal. Mod MR.  TEE-guided DCCV to NSR on 12/16, TEE with EF 20-25%, moderate to severe RV dysfunction, severe functional MR.  12/20: Febrile. Bld CX x2- NGTD. Respiratory panel negative. Started empiric antibiotics.  12/21: S/P Impella 5.5   Remains intubated. FiO2 40%  Currently on vaso 0.04 units, epi 8, norepi 22 mcg , milrinone 0.25 mcg (added earlier this morning), and amio 60 mg. CO-OX 63%.   Lactic Acid 5.7>3.8>1.8  Creatinine 2.3> 2  Swan #s  CVP 12-13 PA 49/27 Papi 1.8  CO 3.5  CI 1.4    Impella 5.5 : Flow 4.3   Awake on vent.   Objective:   Weight Range: 122.5 kg Body mass index is 35.62 kg/m.   Vital Signs:   Temp:  [93.9 F (34.4 C)-102.4 F (39.1 C)] 102.4 F (39.1 C) (12/22 0615) Pulse Rate:  [33-115] 75 (12/22 0407) Resp:  [0-36] 18 (12/22 0407) BP: (82-126)/(54-90) 99/54 (12/21 2020) SpO2:  [90 %-99 %] 97 % (12/22 0407) FiO2 (%):  [40 %-100 %] 40 % (12/22 0400) Weight:  [122.5 kg] 122.5 kg (12/21 0823) Last BM Date: 04/24/21  Weight change: Filed Weights   04/25/21 0500 04/26/21 0500 04/26/21 0823  Weight: 124.5 kg 122.5 kg 122.5 kg    Intake/Output:   Intake/Output Summary (Last 24 hours) at 04/27/2021 0651 Last data filed at 04/27/2021 0600 Gross per 24 hour  Intake 5411.02 ml  Output 2075 ml  Net 3336.02 ml      Physical Exam  CVP 12-13  General:  Intubated  HEENT: ETT  Neck: supple. JVP difficult to assess Carotids 2+ bilat; no bruits. No lymphadenopathy or thryomegaly appreciated.RIJ swan Cor: PMI nondisplaced. Tachy Regular rate & rhythm. No rubs, gallops or murmurs.R axillary Impella with small  hematoma Lungs: clear Abdomen: soft, nontender, nondistended. No hepatosplenomegaly. No bruits or masses. Good bowel sounds. Extremities: no cyanosis, clubbing, rash, edema. LLE old skin graft. RUE PICC  Neuro: Awake on vent. MAE x4.  GU: foley clear urine  Telemetry   A fib RVR 100s . Late yesterday had NSVT.   Labs    CBC Recent Labs    04/26/21 1903 04/27/21 0336 04/27/21 0443  WBC 18.2* 16.8*  --   HGB 14.7 14.2 15.0  HCT 44.3 42.3 44.0  MCV 89.3 88.1  --   PLT 183 175  --    Basic Metabolic Panel Recent Labs    04/26/21 1903 04/26/21 2152 04/27/21 0412 04/27/21 0443  NA 128*  --  128* 128*  K 3.4*  --  4.8 4.8  CL 93*  --  94*  --   CO2 20*  --  23  --   GLUCOSE 241*  --  123*  --   BUN 40*  --  38*  --   CREATININE 2.28*  --  1.98*  --   CALCIUM 8.1*  --  8.6*  --   MG  --  1.9 1.9  --    Liver Function Tests Recent Labs    04/26/21 1903 04/27/21 0412  AST 32 29  ALT 24 26  ALKPHOS 31* 30*  BILITOT 1.7* 2.2*  PROT 6.4* 6.7  ALBUMIN 3.5 3.5    No results for input(s): LIPASE, AMYLASE in the last 72 hours. Cardiac Enzymes No results for input(s): CKTOTAL, CKMB, CKMBINDEX, TROPONINI in the last 72 hours.  BNP: BNP (last 3 results) Recent Labs    01/23/21 1104 03/08/21 1605 04/19/21 0954  BNP 119.8* 390.0* 154.2*    ProBNP (last 3 results) No results for input(s): PROBNP in the last 8760 hours.   D-Dimer No results for input(s): DDIMER in the last 72 hours.  Hemoglobin A1C Recent Labs    04/26/21 1903  HGBA1C 5.7*   Fasting Lipid Panel Recent Labs    04/25/21 1516  CHOL 127  HDL 28*  LDLCALC 83  TRIG 80  CHOLHDL 4.5   Thyroid Function Tests Recent Labs    04/25/21 1516  TSH 1.292     Other results:   Imaging    DG Abd 1 View  Result Date: 04/26/2021 CLINICAL DATA:  Orogastric tube placement EXAM: ABDOMEN - 1 VIEW COMPARISON:  None. FINDINGS: Enteric tube courses below the hemidiaphragm with tip and side  port overlying the expected region of the gastric lumen. The bowel gas pattern is normal. No radio-opaque calculi or other significant radiographic abnormality are seen. IMPRESSION: 1. Enteric tube in appropriate position. 2. Please see separately dictated chest x-ray 04/26/2021. 3. Nonobstructive bowel gas pattern. Electronically Signed   By: Iven Finn M.D.   On: 04/26/2021 21:09   DG CHEST PORT 1 VIEW  Result Date: 04/26/2021 CLINICAL DATA:  Endotracheal 2 EXAM: PORTABLE CHEST 1 VIEW COMPARISON:  Chest x-ray 04/26/2021 FINDINGS: Endotracheal tube with tip terminating 3.5 cm above the carina. Enteric tube coursing below the hemidiaphragm with tip and side port collimated off view. Right internal jugular approach Swan-Ganz catheter with tip overlying the expected region of the proximal right main pulmonary artery. Cardiac paddles overlie the patient. Lines and tubes overlie the patient. Enlarged cardiac silhouette. The heart and mediastinal contours are unchanged. Bilateral mid to lower lung zone airspace opacities, right greater than left. No pulmonary edema. No pleural effusion. No pneumothorax. No acute osseous abnormality. IMPRESSION: 1. Endotracheal tube and right internal jugular approach Swan-Ganz cathetern in good position. 2. Enteric tube coursing below the hemidiaphragm with tip and side port collimated off view. 3. Bilateral mid to lower lung zone airspace opacities, right greater than left. 4. Enlarged cardiac silhouette. Electronically Signed   By: Iven Finn M.D.   On: 04/26/2021 21:07   DG CHEST PORT 1 VIEW  Result Date: 04/26/2021 CLINICAL DATA:  Check Impella placement, initial encounter EXAM: PORTABLE CHEST 1 VIEW COMPARISON:  04/24/2021 FINDINGS: Impella device is now seen extending from the right arm into the left ventricle. Endotracheal tube and Swan-Ganz catheter are noted in satisfactory position. Right-sided PICC line is noted extending into the right internal jugular  vein. This is stable from the prior exam. Lungs are well aerated bilaterally. Cardiac shadow remains enlarged. Central vascular congestion is noted. No bony abnormality is seen. No pneumothorax is noted. IMPRESSION: Status post Impella device placement in satisfactory position. Endotracheal tube and Swan-Ganz catheter are noted in satisfactory position. Right-sided PICC line courses into the right internal jugular vein. Electronically Signed   By: Inez Catalina M.D.   On: 04/26/2021 17:40   DG C-Arm 1-60 Min-No Report  Result Date: 04/26/2021 Fluoroscopy was utilized by the requesting physician.  No radiographic interpretation.     Medications:  Scheduled Medications:  alteplase  2 mg Intracatheter Once   chlorhexidine gluconate (MEDLINE KIT)  15 mL Mouth Rinse BID   Chlorhexidine Gluconate Cloth  6 each Topical Daily   digoxin  0.125 mg Per Tube Daily   docusate  100 mg Per Tube BID   magnesium oxide  400 mg Per Tube Daily   mouth rinse  15 mL Mouth Rinse 10 times per day   montelukast  10 mg Per Tube QHS   polyethylene glycol  17 g Per Tube Daily   pregabalin  150 mg Oral TID   sertraline  25 mg Per Tube Daily   sodium chloride flush  10-40 mL Intracatheter Q12H   sodium chloride flush  10-40 mL Intracatheter Q12H   sodium chloride flush  3 mL Intravenous Q12H   sugammadex sodium  2 mg/kg Intravenous Once    Infusions:  sodium chloride     sodium chloride 10 mL/hr at 04/26/21 0800   sodium chloride     amiodarone 60 mg/hr (04/27/21 0600)   ceFEPime (MAXIPIME) IV Stopped (04/26/21 2326)   dexmedetomidine (PRECEDEX) IV infusion 1.2 mcg/kg/hr (04/27/21 0600)   epinephrine 8 mcg/min (04/27/21 0600)   famotidine (PEPCID) IV Stopped (04/27/21 0122)   furosemide (LASIX) 200 mg in dextrose 5% 100 mL (60m/mL) infusion 20 mg/hr (04/26/21 1700)   heparin 50 units/mL (Impella PURGE) in dextrose 5 % 1000 mL bag     heparin 1,100 Units/hr (04/27/21 0600)   insulin 2 Units/hr (04/27/21  0600)   milrinone 0.25 mcg/kg/min (04/27/21 0600)   norepinephrine 24 mcg/min (04/27/21 0649)   vancomycin Stopped (04/27/21 0030)   vasopressin 0.04 Units/min (04/27/21 0600)    PRN Medications: sodium chloride, sodium chloride, Place/Maintain arterial line **AND** sodium chloride, acetaminophen (TYLENOL) oral liquid 160 mg/5 mL, albuterol, alum & mag hydroxide-simeth, dextrose, fentaNYL (SUBLIMAZE) injection, fentaNYL (SUBLIMAZE) injection, guaiFENesin-dextromethorphan, HYDROmorphone (DILAUDID) injection, hydrOXYzine, phenol, sodium chloride flush, sodium chloride flush, sodium chloride flush   Assessment/Plan   1. Acute on Chronic Systolic Heart Failure>>Cardiogenic Shock  - diagnosed in 2014. Cath at that time normal cors with EF 20-25% - Echo 10/20 EF 20-25% RV ok. Moderate to severe MR - Cath 11/20 coronaries normal - Echo 01/23/21 EF 25% RV ok. Mild MR - CPX 06/2020 with overall moderate limitation due to CHF and body habitus.  pVO 14.2 slope 30 - Admitted w/ worsening symptoms,NYHA IV and low output in setting of new Afib w/ RVR. Initial Co-ox 38%>>Milrinone and NE added - Echo EF 20-25%, RV ok. Mod MR.  TEE EF 20-25%, moderate to severe RV dysfunction, severe functional MR.   - on Milrinone 0.25 + NE 3 Co-ox 56% . CVP  - TEE-guided DCCV to NSR 12/16 but back in atrial fibrillation with RVR shortly afterwards.  Remains in AF with RVR - 12/21 S/P Impella 5.5 --> flow 4.3  - CO/CI down. Milrinone added this morning. Lactic acid trending down.  - On vaso 0.04 , norepi 22, epi 8, and milrione 0.25 mcg. CO-OX 63% - - CVP trending up. Restart IV lasix.  -  [facility]    2. Atrial Fibrillation w/ RVR - New. Duration unknown.  - TFTs normal.  - C/w IV heparin for now until need for procedures ruled out  - TEE/DCCV to NSR 12/16 but back in AF with RVR 12/17, remains in A Fib.   - Keep K > 4.0 and Mg > 2.0  - Give 2 grams Mag  -  K 4.8  -  Continue amiodarone gtt 60 mg/hr    3. AKI due to ATN - Baseline SCr ~1.1-1.3 - SCr -> 2.3>2 - suspect 2/2 cardiorenal/low output - support hemodynamics   4. Polycythemia - Sleep study not overly impressive. - Being followed at [facility] for polycythemia. Work-up including JAK2 mutation and BMBx have been negative.  - Plan for therapeutic phlebotomy if HCT >= 54 (50 today) - starting a/c given new Afib    5. Mitral Regurgitation  - Severe functional MR on TEE.  If he stabilizes out, would repeat echo in NSR and consider Mitraclip if remains severe.   6. NSVT - monitor w/ dual inotropes  - continue amio gtt - Keep K > 4.0 and Mg >2.0  - Replace Mag    7. Hypokalemia - Follow BMET daily   8. Anxiety - Significant, on Xanax.   9. Fever -12/20 Blood Cx x 2  - Bcx NGTD.   - Respiratory panel - negative.  - T 102.4 WBC 16.8  -Continue empiric antibiotics with vancomycin and cefepime.   Amy Clegg Np-C  6:51 AM  Agree with above.   Underwent Impella 5.5 placement yesterday. Post-op had lactic acidosis and was on high-dose pressors. Pressors weaned overnight but this am outputs are low.   Awake on vent signaling he wants ETT out.   Acidosis improved this am despite low output   General:  Awake on vent Agitated HEENT: normal Neck: supple. RIJ swan  Carotids 2+ bilat; no bruits. No lymphadenopathy or thryomegaly appreciated. Cor: PMI nondisplaced. Irregular tachy. No rubs, gallops or murmurs. Lungs: clear Abdomen: obese  soft, nontender, nondistended. No hepatosplenomegaly. No bruits or masses. Good bowel sounds. Extremities: no cyanosis, clubbing, rash, cool LLE chronic deformity Neuro: on vent moves all 4   He is critically ill despite Impella support. Impella positioning viewed under echo and is stable.   Have increased impella to p-9. Waveforms stable.   Have adjusted inotropes.  He is now signaling that he wants to be extubated and not reintubated.   We  discussed that if he needs reintubation and refuses then we would be forced to switch to comfort care approach and deactivate LVAD support.   Discussed with Dr. Doyne Keel (CCM) at bedside.   CRITICAL CARE Performed by: Glori Bickers  Total critical care time: 45 minutes  Critical care time was exclusive of separately billable procedures and treating other patients.  Critical care was necessary to treat or prevent imminent or life-threatening deterioration.  Critical care was time spent personally by me (independent of midlevel providers or residents) on the following activities: development of treatment plan with patient and/or surrogate as well as nursing, discussions with consultants, evaluation of patient's response to treatment, examination of patient, obtaining history from patient or surrogate, ordering and performing treatments and interventions, ordering and review of laboratory studies, ordering and review of radiographic studies, pulse oximetry and re-evaluation of patient's condition.  Glori Bickers, MD  10:29 AM

## 2021-04-27 NOTE — Progress Notes (Signed)
Patient with expanding hematoma at Impella site.   I held manual pressure for ~30 minutes and placed pressure dressing.   Vitals signs monitored and remained stable. Flows on impella unchanged.   Will check CBC.   Continue ICU monitoring  Additional CCT 40 mins.   Glori Bickers, MD  6:52 PM

## 2021-04-27 NOTE — Progress Notes (Signed)
Patient was informed about procedure process prior to PICC line removal. Patient is in bed with HOB reclined. Patient's nurse and Dr. Kimber Relic at bedside during procedure. Patient was instructed to humm during line removal and was able to comply. Pressure dressing with Vaseline gauze and 2x2 gauze was covered with Tegaderm occlusive dressing upon line removal and pressure is being held to insertion site by Dr. [name]. Line was removed without difficulty or resistance and measured 44cm in length. Prior to line removal, the site is free from redness, swelling, excessive warmth  and patient denied tenderness at the site. No drainage was noted and the Biopatch was free from drainage. Patient offers no complaints at this time. Nurse and provider remain at bedside with patient.

## 2021-04-27 NOTE — Progress Notes (Signed)
ANTICOAGULATION CONSULT NOTE - Follow Up Consult  Pharmacy Consult for heparin Indication:  Impella  Labs: Recent Labs    04/25/21 1155 04/25/21 1516 04/26/21 0258 04/26/21 0259 04/26/21 0941 04/26/21 1408 04/26/21 1903 04/27/21 0129 04/27/21 0336 04/27/21 0412 04/27/21 0443 04/27/21 1143  HGB  --   --  14.3  --   --    < > 14.7  --  14.2  --  15.0 15.0  HCT  --   --  43.4  --   --    < > 44.3  --  42.3  --  44.0 44.0  PLT  --   --  142*  --   --   --  183  --  175  --   --   --   APTT  --  88*  --   --   --   --   --   --   --   --   --   --   LABPROT  --  16.7*  --   --   --   --  16.1*  --   --   --   --   --   INR  --  1.4*  --   --   --   --  1.3*  --   --   --   --   --   HEPARINUNFRC 0.36  --   --  0.30  --   --   --  0.89*  --   --   --   --   CREATININE  --   --  1.96*  --  2.01*  --  2.28*  --   --  1.98*  --   --    < > = values in this interval not displayed.     Assessment: 60yo male supratherapeutic on heparin after Impella placed, systemic heparin adjusted to account for Impella heparin purge.  Patient on systemic heparin and heparin purge overnight. Pharmacy adjusting rates due to elevated levels. Patient became agitated this am and developed hematoma at impella site. Currently holding systemic heparin, follow up restarting this afternoon.   Goal of Therapy:  Heparin level 0.3-0.5 units/ml   Plan:  Restart systemic heparin when able Follow up hematoma   Erin Hearing PharmD., BCPS Clinical Pharmacist 04/27/2021 2:04 PM
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
## 2021-04-28 LAB — BASIC METABOLIC PANEL
Anion gap: 11 (ref 5–15)
Anion gap: 10 (ref 5–15)
BUN: 25 mg/dL — ABNORMAL HIGH (ref 6–20)
BUN: 23 mg/dL — ABNORMAL HIGH (ref 6–20)
CO2: 24 mmol/L (ref 22–32)
CO2: 24 mmol/L (ref 22–32)
Calcium: 8.4 mg/dL — ABNORMAL LOW (ref 8.9–10.3)
Calcium: 8.5 mg/dL — ABNORMAL LOW (ref 8.9–10.3)
Chloride: 88 mmol/L — ABNORMAL LOW (ref 98–111)
Chloride: 91 mmol/L — ABNORMAL LOW (ref 98–111)
Creatinine, Ser: 1.63 mg/dL — ABNORMAL HIGH (ref 0.61–1.24)
Creatinine, Ser: 1.3 mg/dL — ABNORMAL HIGH (ref 0.61–1.24)
GFR, Estimated: 48 mL/min — ABNORMAL LOW (ref >60)
GFR, Estimated: >60 mL/min (ref >60)
Glucose, Bld: 171 mg/dL — ABNORMAL HIGH (ref 70–99)
Glucose, Bld: 175 mg/dL — ABNORMAL HIGH (ref 70–99)
Potassium: 4.3 mmol/L (ref 3.5–5.1)
Potassium: 3.7 mmol/L (ref 3.5–5.1)
Sodium: 123 mmol/L — ABNORMAL LOW (ref 135–145)
Sodium: 125 mmol/L — ABNORMAL LOW (ref 135–145)

## 2021-04-28 LAB — LACTATE DEHYDROGENASE
LDH: 427 U/L — ABNORMAL HIGH (ref 98–192)
LDH: 424 U/L — ABNORMAL HIGH (ref 98–192)

## 2021-04-28 LAB — CBC
HCT: 37.7 % — ABNORMAL LOW (ref 39.0–52.0)
HCT: 35.1 % — ABNORMAL LOW (ref 39.0–52.0)
Hemoglobin: 12.6 g/dL — ABNORMAL LOW (ref 13.0–17.0)
Hemoglobin: 12.2 g/dL — ABNORMAL LOW (ref 13.0–17.0)
MCH: 29.6 pg (ref 26.0–34.0)
MCH: 30.3 pg (ref 26.0–34.0)
MCHC: 33.4 g/dL (ref 30.0–36.0)
MCHC: 34.8 g/dL (ref 30.0–36.0)
MCV: 88.7 fL (ref 80.0–100.0)
MCV: 87.3 fL (ref 80.0–100.0)
Platelets: 120 10*3/uL — ABNORMAL LOW (ref 150–400)
Platelets: DECREASED 10*3/uL (ref 150–400)
RBC: 4.25 MIL/uL (ref 4.22–5.81)
RBC: 4.02 MIL/uL — ABNORMAL LOW (ref 4.22–5.81)
RDW: 13.6 % (ref 11.5–15.5)
RDW: 13.5 % (ref 11.5–15.5)
WBC: 20.6 10*3/uL — ABNORMAL HIGH (ref 4.0–10.5)
WBC: 18.5 10*3/uL — ABNORMAL HIGH (ref 4.0–10.5)
nRBC: 0 % (ref 0.0–0.2)
nRBC: 0 % (ref 0.0–0.2)

## 2021-04-28 LAB — GLUCOSE, CAPILLARY
Glucose-Capillary: 103 mg/dL — ABNORMAL HIGH (ref 70–99)
Glucose-Capillary: 120 mg/dL — ABNORMAL HIGH (ref 70–99)
Glucose-Capillary: 165 mg/dL — ABNORMAL HIGH (ref 70–99)
Glucose-Capillary: 176 mg/dL — ABNORMAL HIGH (ref 70–99)
Glucose-Capillary: 180 mg/dL — ABNORMAL HIGH (ref 70–99)
Glucose-Capillary: 184 mg/dL — ABNORMAL HIGH (ref 70–99)

## 2021-04-28 LAB — HEPARIN LEVEL (UNFRACTIONATED): Heparin Unfractionated: <0.1 IU/mL — ABNORMAL LOW (ref 0.30–0.70)

## 2021-04-28 LAB — COOXEMETRY PANEL
Carboxyhemoglobin: 2.1 % — ABNORMAL HIGH (ref 0.5–1.5)
Methemoglobin: 0.8 % (ref 0.0–1.5)
O2 Saturation: 71.5 %
Total hemoglobin: 12.9 g/dL (ref 12.0–16.0)

## 2021-04-28 LAB — MAGNESIUM: Magnesium: 2 mg/dL (ref 1.7–2.4)

## 2021-04-28 MED ORDER — METHOCARBAMOL 500 MG PO TABS
500.0000 mg | ORAL_TABLET | Freq: Four times a day (QID) | ORAL | Status: DC | PRN
  Administered 2021-04-28 – 2021-04-29 (×3): 500 mg via ORAL
  Filled 2021-04-28 (×3): qty 1

## 2021-04-28 MED ORDER — "THROMBI-PAD 3""X3"" EX PADS"
1.0000 | MEDICATED_PAD | Freq: Once | CUTANEOUS | Status: AC
  Administered 2021-04-28: 06:00:00 1 via TOPICAL
  Filled 2021-04-28: qty 1

## 2021-04-28 MED ORDER — TOLVAPTAN 15 MG PO TABS
15.0000 mg | ORAL_TABLET | Freq: Once | ORAL | Status: AC
  Administered 2021-04-28: 10:00:00 15 mg via ORAL
  Filled 2021-04-28: qty 1

## 2021-04-28 MED ORDER — TRAMADOL HCL 50 MG PO TABS: 50.0000 mg | ORAL_TABLET | Freq: Four times a day (QID) | ORAL | Status: DC | PRN

## 2021-04-28 MED ORDER — SORBITOL 70 % SOLN
30.0000 mL | Freq: Once | Status: AC
  Administered 2021-04-28: 20:00:00 30 mL via ORAL
  Filled 2021-04-28: qty 30

## 2021-04-28 MED ORDER — POTASSIUM CHLORIDE CRYS ER 20 MEQ PO TBCR
40.0000 meq | EXTENDED_RELEASE_TABLET | Freq: Once | ORAL | Status: AC
  Administered 2021-04-28: 13:00:00 40 meq via ORAL
  Filled 2021-04-28: qty 2

## 2021-04-28 MED ORDER — OXYCODONE-ACETAMINOPHEN 5-325 MG PO TABS
2.0000 | ORAL_TABLET | Freq: Four times a day (QID) | ORAL | Status: DC | PRN
  Administered 2021-04-28 – 2021-04-29 (×4): 2 via ORAL
  Filled 2021-04-28 (×5): qty 2

## 2021-04-28 NOTE — Progress Notes (Signed)
ANTICOAGULATION CONSULT NOTE - Follow Up Consult  Pharmacy Consult for heparin Indication:  Impella  Labs: Recent Labs    04/25/21 1516 04/26/21 0258 04/26/21 0259 04/26/21 0941 04/26/21 1903 04/27/21 0129 04/27/21 0336 04/27/21 0412 04/27/21 0443 04/27/21 1143 04/28/21 0305 04/28/21 0516  HGB  --    < >  --    < > 14.7  --  14.2  --  15.0 15.0 12.6*  --   HCT  --    < >  --    < > 44.3  --  42.3  --  44.0 44.0 37.7*  --   PLT  --    < >  --   --  183  --  175  --   --   --  120*  --   APTT 88*  --   --   --   --   --   --   --   --   --   --   --   LABPROT 16.7*  --   --   --  16.1*  --   --   --   --   --   --   --   INR 1.4*  --   --   --  1.3*  --   --   --   --   --   --   --   HEPARINUNFRC  --   --  0.30  --   --  0.89*  --   --   --   --   --  <0.10*  CREATININE  --    < >  --    < > 2.28*  --   --  1.98*  --   --  1.63*  --    < > = values in this interval not displayed.     Assessment: 59yo male supratherapeutic on heparin after Impella placed, systemic heparin adjusted to account for Impella heparin purge.  Patient is on heparin purge alone overnight after having a large hematoma at imeplla site needing pressure multiple times. Impella flows have been stable. Hgb down from 14-15s to 12.6, plt count trended down from 180>120 and LDH also trended up to 427. Tentative plan is to hold systemic heparin today and restart in am if impella site stable from bleeding perspective.   Goal of Therapy:  Heparin level 0.3-0.5 units/ml   Plan:  Restart systemic heparin when able - f/up 12/24  Erin Hearing PharmD., BCPS Clinical Pharmacist 04/28/2021 11:04 AM

## 2021-04-28 NOTE — Progress Notes (Signed)
Pharmacy Antibiotic Note  Victor [name] is a 60 y.o. male admitted on 04/19/2021 with Heart failure. Pharmacy has been consulted for vancomycin and meropenem dosing.  Patient extubated yesterday, still having intermittent fevers to 102.6. WBC elevated at 18.5 (down). Cefepime broadened to meropenem yesterday with continued fevers. Scr improved to 1.3. Cultures no growth.   Will likely be able to narrow antibiotics when patient is fever free. Picc line removed yesterday as possible source of infection.   Plan: Meropenem 1g q8 hours Vancomycin 1gm q12h - will check levels tomorrow to assess dosing  Height: 6\' 1"  (185.4 cm) Weight: 127.7 kg (281 lb 8.4 oz) IBW/kg (Calculated) : 79.9  Temp (24hrs), Avg:101.5 F (38.6 C), Min:99.9 F (37.7 C), Max:102.6 F (39.2 C)  Recent Labs  Lab 04/23/21 1958 04/23/21 2136 04/24/21 0300 04/25/21 0307 04/26/21 0258 04/26/21 0941 04/26/21 1705 04/26/21 1903 04/27/21 0013 04/27/21 0336 04/27/21 0412 04/28/21 0305  WBC  --   --  9.9 10.5 8.2  --   --  18.2*  --  16.8*  --  20.6*  CREATININE 1.88*   < > 1.90* 1.97* 1.96* 2.01*  --  2.28*  --   --  1.98* 1.63*  LATICACIDVEN 1.1  --  1.0  --   --   --  5.7*  --  3.8* 1.8  --   --    < > = values in this interval not displayed.     Estimated Creatinine Clearance: 68.3 mL/min (A) (by C-G formula based on SCr of 1.63 mg/dL (H)).    No Known Allergies  Antimicrobials this admission: Vanc 12/20> Cefepime 12/20>12/22 Meropenem 12/22>>  Microbiology results: 12/21 TA: ngtd 12/20 Bcx ngtd 12/20 resp viral panel: nothing detected 12/14 MRSA pcr neg  Erin Hearing PharmD., BCPS Clinical Pharmacist 04/28/2021 7:34 AM

## 2021-04-28 NOTE — Progress Notes (Addendum)
Patient ID: Victor [name], male   DOB: Jan 29, 1961, 59 y.o.   MRN: [id number]     Advanced Heart Failure Rounding Note  PCP-Cardiologist: Victor [name]  [facility]: Victor [name]   Subjective:    12/14: Admitted w/ a/c CHF w/ low output and new Afib w/ RVR. Co-ox 38%. Placed on NE, milrinone and amio gtt. Echo EF 20-25%, RV normal. Mod MR.  TEE-guided DCCV to NSR on 12/16, TEE with EF 20-25%, moderate to severe RV dysfunction, severe functional MR.  12/20: Febrile. Bld CX x2- NGTD. Respiratory panel negative. Started empiric antibiotics.  12/21: S/P Impella 5.5  12/22: Added milrinone. Pressors weaned. Impella increased to P9. Hematoma around impella insertion. Improved with manual pressure. Extubated. Heparin stopped last night.   Currently on vaso 0.04 units, epi 5, norepi 9 mcg , milrinone 0.25 mcg , and amio 60 mg. CO-OX 71.5%.    TMax 102  WBC 16>20  Lactic Acid 5.7>3.8>1.8  Creatinine 2.3> 2>1.6  LDH: 287>427  Hgb trending down 15>12.6 (hematoma)   Swan #s  CVP 6 PA 42/24 (35) Papi 2.57  CO 7.8 CI 3.2   Impella 5.5 : P9 Flow 5.1  Complaining of r shoulder pain. Denies SOB.    Objective:   Weight Range: 127.7 kg Body mass index is 37.14 kg/m.   Vital Signs:   Temp:  [99.9 F (37.7 C)-102.6 F (39.2 C)] 99.9 F (37.7 C) (12/23 0700) Pulse Rate:  [73-202] 106 (12/23 0700) Resp:  [0-39] 21 (12/23 0700) BP: (125-133)/(102-107) 125/102 (12/23 0700) SpO2:  [88 %-99 %] 96 % (12/23 0700) Arterial Line BP: (87-140)/(65-113) 129/83 (12/23 0700) FiO2 (%):  [40 %] 40 % (12/22 0915) Weight:  [127.7 kg-127.8 kg] 127.7 kg (12/23 0500) Last BM Date: 04/24/21  Weight change: Filed Weights   04/26/21 0823 04/27/21 0755 04/28/21 0500  Weight: 122.5 kg 127.8 kg 127.7 kg    Intake/Output:   Intake/Output Summary (Last 24 hours) at 04/28/2021 0704 Last data filed at 04/28/2021 0700 Gross per 24 hour  Intake 4923.25 ml  Output 4165 ml  Net 758.25 ml       Physical Exam  CVP 6-7  General:  . No resp difficulty HEENT: normal Neck: supple. no JVD. Carotids 2+ bilat; no bruits. No lymphadenopathy or thryomegaly appreciated. RIJ  Cor: PMI nondisplaced. Regular rate & rhythm. No rubs, gallops or murmurs. R axillary impella dressing ok. Hematoma stable.  Lungs: clear on room air.  Abdomen: soft, nontender, nondistended. No hepatosplenomegaly. No bruits or masses. Good bowel sounds. Extremities: no cyanosis, clubbing, rash, edema. LUE radial a line Neuro: alert & orientedx3, cranial nerves grossly intact. moves all 4 extremities w/o difficulty. Affect flat  GU: Foley   Telemetry    A Fib 100s with occasional PVCs.   Labs    CBC Recent Labs    04/27/21 0336 04/27/21 0443 04/27/21 1143 04/28/21 0305  WBC 16.8*  --   --  20.6*  HGB 14.2   < > 15.0 12.6*  HCT 42.3   < > 44.0 37.7*  MCV 88.1  --   --  88.7  PLT 175  --   --  120*   < > = values in this interval not displayed.   Basic Metabolic Panel Recent Labs    04/27/21 0412 04/27/21 0443 04/27/21 1143 04/28/21 0305  NA 128*   < > 128* 123*  K 4.8   < > 4.4 4.3  CL 94*  --   --  88*  CO2 23  --   --  24  GLUCOSE 123*  --   --  171*  BUN 38*  --   --  25*  CREATININE 1.98*  --   --  1.63*  CALCIUM 8.6*  --   --  8.4*  MG 1.9  --   --  2.0   < > = values in this interval not displayed.   Liver Function Tests Recent Labs    04/26/21 1903 04/27/21 0412  AST 32 29  ALT 24 26  ALKPHOS 31* 30*  BILITOT 1.7* 2.2*  PROT 6.4* 6.7  ALBUMIN 3.5 3.5    No results for input(s): LIPASE, AMYLASE in the last 72 hours. Cardiac Enzymes No results for input(s): CKTOTAL, CKMB, CKMBINDEX, TROPONINI in the last 72 hours.  BNP: BNP (last 3 results) Recent Labs    01/23/21 1104 03/08/21 1605 04/19/21 0954  BNP 119.8* 390.0* 154.2*    ProBNP (last 3 results) No results for input(s): PROBNP in the last 8760 hours.   D-Dimer No results for input(s): DDIMER in the  last 72 hours.  Hemoglobin A1C Recent Labs    04/26/21 1903  HGBA1C 5.7*   Fasting Lipid Panel Recent Labs    04/25/21 1516  CHOL 127  HDL 28*  LDLCALC 83  TRIG 80  CHOLHDL 4.5   Thyroid Function Tests Recent Labs    04/25/21 1516  TSH 1.292     Other results:   Imaging    VAS US DOPPLER PRE VAD  Result Date: 04/27/2021 PERIOPERATIVE VASCULAR EVALUATION Patient Name:  Victor [name]  Date of Exam:   04/27/2021 Medical Rec #: [id number]         Accession #:    [id number] Date of Birth: January 21, 1961         Patient Gender: M Patient Age:   71 years Exam Location:  [facility] Procedure:      VAS US DOPPLER PRE VAD Referring Phys: Victor [name] --------------------------------------------------------------------------------  Indications:      Pre-surgical evaluation for LVAD. Cold left foot. Risk Factors:     Hypertension. Other Factors:    Ischemic cardiomyopathy. On vasopressors. Limitations:      Arrhythmia, Impella, Line & Bandaging Right Neck Comparison Study: No prior study Performing Technologist: Maudry Mayhew MHA, RVT, RDCS, RDMS  Examination Guidelines: A complete evaluation includes B-mode imaging, spectral Doppler, color Doppler, and power Doppler as needed of all accessible portions of each vessel. Bilateral testing is considered an integral part of a complete examination. Limited examinations for reoccurring indications may be performed as noted.  Right Carotid Findings: +--------+--------+--------+--------+--------+----------------------------+           PSV cm/s EDV cm/s Stenosis Describe Comments                      +--------+--------+--------+--------+--------+----------------------------+  CCA Prox                                     Right line/bandaging at neck  +--------+--------+--------+--------+--------+----------------------------+  ICA Prox                                     Right line/bandaging at neck   +--------+--------+--------+--------+--------+----------------------------+ Unable to access right side due to line/bandaging. Left Carotid Findings: +----------+--------+--------+--------+--------+------------------+  PSV cm/s EDV cm/s Stenosis Describe Comments            +----------+--------+--------+--------+--------+------------------+  CCA Prox   62       21                                             +----------+--------+--------+--------+--------+------------------+  CCA Distal 44       17                                             +----------+--------+--------+--------+--------+------------------+  ICA Prox   17       10                         intimal thickening  +----------+--------+--------+--------+--------+------------------+  ICA Distal 40       23                                             +----------+--------+--------+--------+--------+------------------+  ECA        21       7                                              +----------+--------+--------+--------+--------+------------------+ +----------+--------+--------+----------------+------------+  Subclavian PSV cm/s EDV cm/s Describe         Arm Pressure  +----------+--------+--------+----------------+------------+             69                Multiphasic, WNL 106           +----------+--------+--------+----------------+------------+ +---------+--------+--+--------+-+---------+  Vertebral PSV cm/s 17 EDV cm/s 6 Antegrade  +---------+--------+--+--------+-+---------+  ABI Findings: +---------+------------------+-----+--------+--------------------+  Right     Rt Pressure (mmHg) Index Waveform Comment               +---------+------------------+-----+--------+--------------------+  Brachial                                    Restricted extremity  +---------+------------------+-----+--------+--------------------+  PTA       107                1.01                                  +---------+------------------+-----+--------+--------------------+  DP        123                1.16                                 +---------+------------------+-----+--------+--------------------+  Great Toe                          Absent                         +---------+------------------+-----+--------+--------------------+ +---------+------------------+-----+--------+-------+  Left      Lt Pressure (mmHg) Index Waveform Comment  +---------+------------------+-----+--------+-------+  Brachial  106                                        +---------+------------------+-----+--------+-------+  PTA       88                 0.83                    +---------+------------------+-----+--------+-------+  DP                                 absent            +---------+------------------+-----+--------+-------+  Great Toe                          Absent            +---------+------------------+-----+--------+-------+ +-------+---------------+----------------+  ABI/TBI Today's ABI/TBI Previous ABI/TBI  +-------+---------------+----------------+  Right   1.16                              +-------+---------------+----------------+  Left    0.83                              +-------+---------------+----------------+ Unable to accurately evaluate waveforms due to Impella.  Summary: Right Carotid: Unable to access right side due to line/bandaging. Left Carotid: The extracranial vessels were near-normal with only minimal wall               thickening or plaque. Vertebrals:  Left vertebral artery demonstrates antegrade flow. Subclavians: Normal flow hemodynamics were seen in the left subclavian artery.  *See table(s) above for measurements and observations. Right ABI: Resting right ankle-brachial index is within normal range. No evidence of significant right lower extremity arterial disease. Left ABI: Resting left ankle-brachial index indicates mild left lower extremity arterial disease.     Preliminary    VAS [place] LOWER  EXTREMITY VENOUS (DVT)  Result Date: 04/27/2021  Lower Venous DVT Study Patient Name:  LUCIUS WISE  Date of Exam:   04/27/2021 Medical Rec #: [id number]         Accession #:    [id number] Date of Birth: May 22, 1960         Patient Gender: M Patient Age:   13 years Exam Location:  [facility] Procedure:      VAS [place] LOWER EXTREMITY VENOUS (DVT) Referring Phys: Victor Quince Georgi Navarrete --------------------------------------------------------------------------------  Indications: Pre-VAD.  Comparison Study: No prior studies. Performing Technologist: Darlin Coco RDMS, RVT  Examination Guidelines: A complete evaluation includes B-mode imaging, spectral Doppler, color Doppler, and power Doppler as needed of all accessible portions of each vessel. Bilateral testing is considered an integral part of a complete examination. Limited examinations for reoccurring indications may be performed as noted. The reflux portion of the exam is performed with the patient in reverse Trendelenburg.  +---------+---------------+---------+-----------+----------+--------------+  RIGHT     Compressibility Phasicity Spontaneity Properties Thrombus Aging  +---------+---------------+---------+-----------+----------+--------------+  CFV       Full            Yes       Yes                                    +---------+---------------+---------+-----------+----------+--------------+  SFJ       Full                                                             +---------+---------------+---------+-----------+----------+--------------+  FV Prox   Full                                                             +---------+---------------+---------+-----------+----------+--------------+  FV Mid    Full                                                             +---------+---------------+---------+-----------+----------+--------------+  FV Distal Full                                                              +---------+---------------+---------+-----------+----------+--------------+  PFV       Full                                                             +---------+---------------+---------+-----------+----------+--------------+  POP       Full            Yes       Yes                                    +---------+---------------+---------+-----------+----------+--------------+  PTV       Full                                                             +---------+---------------+---------+-----------+----------+--------------+  PERO      Full                                                             +---------+---------------+---------+-----------+----------+--------------+   +---------+---------------+---------+-----------+----------+-------------------+  LEFT      Compressibility Phasicity Spontaneity Properties Thrombus Aging       +---------+---------------+---------+-----------+----------+-------------------+  CFV       Full            Yes       Yes                                         +---------+---------------+---------+-----------+----------+-------------------+  SFJ       Full                                                                  +---------+---------------+---------+-----------+----------+-------------------+  FV Prox   Full                                                                  +---------+---------------+---------+-----------+----------+-------------------+  FV Mid    Full                                                                  +---------+---------------+---------+-----------+----------+-------------------+  FV Distal Full                                                                  +---------+---------------+---------+-----------+----------+-------------------+  PFV       Full                                                                  +---------+---------------+---------+-----------+----------+-------------------+  POP       Full            Yes       Yes                                          +---------+---------------+---------+-----------+----------+-------------------+  PTV       Full                                                                  +---------+---------------+---------+-----------+----------+-------------------+  PERO      Full                                             Visibility of some  segments limited                                                                 due to skin changes                                                              secondary to graft.  +---------+---------------+---------+-----------+----------+-------------------+     Summary: RIGHT: - There is no evidence of deep vein thrombosis in the lower extremity.  - No cystic structure found in the popliteal fossa.  LEFT: - There is no evidence of deep vein thrombosis in the lower extremity.  - No cystic structure found in the popliteal fossa.  *See table(s) above for measurements and observations.    Preliminary      Medications:     Scheduled Medications:  alteplase  2 mg Intracatheter Once   Chlorhexidine Gluconate Cloth  6 each Topical Daily   digoxin  0.125 mg Oral Daily   docusate sodium  100 mg Oral BID   famotidine  20 mg Oral BID   feeding supplement  237 mL Oral BID BM   furosemide  80 mg Intravenous BID   insulin aspart  3-9 Units Subcutaneous Q4H   insulin detemir  8 Units Subcutaneous Q12H   magnesium oxide  400 mg Oral Daily   mouth rinse  15 mL Mouth Rinse BID   montelukast  10 mg Oral QHS   multivitamin with minerals  1 tablet Oral Daily   polyethylene glycol  17 g Oral Daily   pregabalin  150 mg Oral TID   sertraline  25 mg Oral Daily   sodium chloride flush  10-40 mL Intracatheter Q12H   sodium chloride flush  10-40 mL Intracatheter Q12H   sodium chloride flush  3 mL Intravenous Q12H    Infusions:  sodium chloride     sodium chloride 10 mL/hr at 04/26/21 0800   sodium  chloride     amiodarone 60 mg/hr (04/28/21 0700)   dexmedetomidine (PRECEDEX) IV infusion Stopped (04/27/21 1010)   epinephrine 5 mcg/min (04/28/21 0700)   heparin 50 units/mL (Impella PURGE) in dextrose 5 % 1000 mL bag     insulin Stopped (04/27/21 1304)   meropenem (MERREM) IV Stopped (04/28/21 3734)   milrinone 0.25 mcg/kg/min (04/28/21 0700)   norepinephrine 8 mcg/min (04/28/21 0700)   vancomycin Stopped (04/27/21 2220)   vasopressin 0.04 Units/min (04/28/21 0700)    PRN Medications: sodium chloride, sodium chloride, Place/Maintain arterial line **AND** sodium chloride, acetaminophen, acetaminophen, albuterol, alum & mag hydroxide-simeth, dextrose, fentaNYL (SUBLIMAZE) injection, guaiFENesin-dextromethorphan, hydrOXYzine, phenol, sodium chloride flush, sodium chloride flush, sodium chloride flush, traMADol   Assessment/Plan   1. Acute on Chronic Systolic Heart Failure>>Cardiogenic Shock  - diagnosed in 2014. Cath at that time normal cors with EF 20-25% - Echo 10/20 EF 20-25% RV ok. Moderate to severe MR - Cath 11/20 coronaries normal - Echo 01/23/21 EF 25% RV ok. Mild MR - CPX 06/2020 with overall moderate limitation due to CHF and body habitus.  pVO 14.2 slope 30 - Admitted  w/ worsening symptoms,NYHA IV and low output in setting of new Afib w/ RVR. Initial Co-ox 38%>>Milrinone and NE added - Echo EF 20-25%, RV ok. Mod MR.  TEE EF 20-25%, moderate to severe RV dysfunction, severe functional MR.   - TEE-guided DCCV to NSR 12/16 but back in atrial fibrillation with RVR shortly afterwards.  Remains in AF with RVR - 12/21 S/P Impella 5.5 --> flow 4.3 --> on 12/22 increased to P9  - Had hematoma around site. Resolved with manual pressure. Heparin drip stopped. Hgb 15> 12.6 - Hemodynamics improved. CO-OX 71%. CO 7.8 CI  3.2 CO-OX 71.5%.  - LDH trending 287>427 - On vaso 0.04 , norepi 9, epi 5, and milrione 0.25 mcg. Maps in the 90s. Can slowly wean pressors.  -  Once stable consider  transfer to [facility] for transplant    2. Atrial Fibrillation w/ RVR - New. Duration unknown.  - TFTs normal.  - C/w IV heparin for now until need for procedures ruled out  - TEE/DCCV to NSR 12/16 but back in AF with RVR 12/17, remains in A Fib.   - Keep K > 4.0 and Mg > 2.0  - Stable electrolytes - Cut back amio 30 mg per hour.  -  Currently off heparin drip with hematoma. Hopefully restart today.    3. AKI due to ATN - Baseline SCr ~1.1-1.3 - SCr -> 2.3>2>1.6  - suspect 2/2 cardiorenal/low output - support hemodynamics   4. Polycythemia - Sleep study not overly impressive. - Being followed at [facility] for polycythemia. Work-up including JAK2 mutation and BMBx have been negative. Gets therapeutic phlebotomy as outpatient   5. Mitral Regurgitation  - Severe functional MR on TEE.  If he stabilizes out, would repeat echo in NSR and consider Mitraclip if remains severe.   6. NSVT - monitor w/ dual inotropes  - continue amio gtt cut back to amio 30 mg per hour.  - Keep K > 4.0 and Mg >2.0  - Stable today.   7. Hypokalemia - Follow BMET daily   8. Anxiety - Significant, on Xanax.   9. Fever -12/20 Blood Cx x 2  - Bcx NGTD.   - Respiratory panel - negative.  - Remains febrile T102 WBC 16.8 >20.6 -Continue empiric antibiotics with vancomycin and meropenum.   10. Hypnatremia Sodium 123. Repeat BMET. May need dose tolvaptan.   Repeat CBC, BMET, LDH around noon.    Amy Clegg NP-C  7:04 AM  Agree with above.   Extubated yesterday. Developed hematoma at Impella insertion site. Heparin stopped manual pressure held.   Remains on VP, NE, EPI and milrinone. Swan numbers much improved. Wean epi. Drop VP to 0.03.   Remains Febrile. Bcx NGTD  Impella numbers stable. Creatinine improving  General:  Sitting up in bed No resp difficulty HEENT: normal Neck: RIJ swan Carotids 2+ bilat; no bruits. No lymphadenopathy or thryomegaly appreciated. Cor: Impella site looks good. +  pressure dressing Irregular rate & rhythm. No rubs, gallops or murmurs. Lungs: clear Abdomen: soft, nontender, nondistended. No hepatosplenomegaly. No bruits or masses. Good bowel sounds. Extremities: no cyanosis, clubbing, rash, edema LLE with chronic scarring Neuro: alert & orientedx3, cranial nerves grossly intact. moves all 4 extremities w/o difficulty. Affect pleasant  Improved overnight but still very tenuous. Will wean epi and VP   Continue abx. Follow cultures.   Hold heparin one more day. Start low-dose systemic heparin tomorrow. Continue heparin in purge.   Give tolvaptan.   Once stabilized  will re-connect with [facility] re: transfer for transplant eval.  Continue to mobilize   CRITICAL CARE Performed by: Glori Bickers  Total critical care time: 45 minutes  Critical care time was exclusive of separately billable procedures and treating other patients.  Critical care was necessary to treat or prevent imminent or life-threatening deterioration.  Critical care was time spent personally by me (independent of midlevel providers or residents) on the following activities: development of treatment plan with patient and/or surrogate as well as nursing, discussions with consultants, evaluation of patient's response to treatment, examination of patient, obtaining history from patient or surrogate, ordering and performing treatments and interventions, ordering and review of laboratory studies, ordering and review of radiographic studies, pulse oximetry and re-evaluation of patient's condition.  Glori Bickers, [name]  8:40 AM
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
## 2021-04-28 NOTE — Progress Notes (Signed)
This chaplain is present for spiritual care requested by the PMT. The chaplain was updated by the Pt. RN-Scott before the visit. The Pt. preferred name is Victor [name] and he accepts the visit. The Pt. Wife will visit later today.  The Pt. shares he does not feel as well as he had hoped he would feel today. The Pt. does not identify a specific area of pain. The Pt. responds with a shrug of his shoulders. The chaplain recognizes the energy it is taking for the Pt. to talk to the chaplain and both agree a F/U visit is appropriate.  In the time together, the chaplain joined the Pt. in identifying his spirituality which includes his family, faith, and love of outdoors. The  chaplain understands the Pt. was active at the gym and enjoyed a fishing trip prior to three months ago.  The Pt. shares his mother has a group of "prayer warriors" praying for him now.  The Pt. accepted the chaplain's invitation for prayer together.  Chaplain Sallyanne Kuster [phone]
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
## 2021-04-28 NOTE — Plan of Care (Signed)
°  Problem: Activity: Goal: Capacity to carry out activities will improve Outcome: Progressing   Problem: Cardiac: Goal: Ability to achieve and maintain adequate cardiopulmonary perfusion will improve Outcome: Progressing   Problem: Nutrition: Goal: Adequate nutrition will be maintained Outcome: Progressing   Problem: Coping: Goal: Level of anxiety will decrease Outcome: Progressing   

## 2021-04-29 LAB — GLUCOSE, CAPILLARY
Glucose-Capillary: 120 mg/dL — ABNORMAL HIGH (ref 70–99)
Glucose-Capillary: 135 mg/dL — ABNORMAL HIGH (ref 70–99)
Glucose-Capillary: 142 mg/dL — ABNORMAL HIGH (ref 70–99)
Glucose-Capillary: 162 mg/dL — ABNORMAL HIGH (ref 70–99)

## 2021-04-29 LAB — BASIC METABOLIC PANEL
Anion gap: 8 (ref 5–15)
BUN: 26 mg/dL — ABNORMAL HIGH (ref 6–20)
CO2: 25 mmol/L (ref 22–32)
Calcium: 8.7 mg/dL — ABNORMAL LOW (ref 8.9–10.3)
Chloride: 96 mmol/L — ABNORMAL LOW (ref 98–111)
Creatinine, Ser: 1.51 mg/dL — ABNORMAL HIGH (ref 0.61–1.24)
GFR, Estimated: 53 mL/min — ABNORMAL LOW (ref >60)
Glucose, Bld: 135 mg/dL — ABNORMAL HIGH (ref 70–99)
Potassium: 3.9 mmol/L (ref 3.5–5.1)
Sodium: 129 mmol/L — ABNORMAL LOW (ref 135–145)

## 2021-04-29 LAB — COOXEMETRY PANEL
Carboxyhemoglobin: 2.3 % — ABNORMAL HIGH (ref 0.5–1.5)
Methemoglobin: 0.9 % (ref 0.0–1.5)
O2 Saturation: 54.9 %
Total hemoglobin: 11.8 g/dL — ABNORMAL LOW (ref 12.0–16.0)

## 2021-04-29 LAB — CULTURE, RESPIRATORY W GRAM STAIN: Culture: NORMAL

## 2021-04-29 LAB — CBC
HCT: 36.8 % — ABNORMAL LOW (ref 39.0–52.0)
Hemoglobin: 12.5 g/dL — ABNORMAL LOW (ref 13.0–17.0)
MCH: 30 pg (ref 26.0–34.0)
MCHC: 34 g/dL (ref 30.0–36.0)
MCV: 88.2 fL (ref 80.0–100.0)
Platelets: 103 10*3/uL — ABNORMAL LOW (ref 150–400)
RBC: 4.17 MIL/uL — ABNORMAL LOW (ref 4.22–5.81)
RDW: 13.7 % (ref 11.5–15.5)
WBC: 21 10*3/uL — ABNORMAL HIGH (ref 4.0–10.5)
nRBC: 0 % (ref 0.0–0.2)

## 2021-04-29 LAB — HEPARIN LEVEL (UNFRACTIONATED): Heparin Unfractionated: <0.1 IU/mL — ABNORMAL LOW (ref 0.30–0.70)

## 2021-04-29 LAB — MAGNESIUM: Magnesium: 2.4 mg/dL (ref 1.7–2.4)

## 2021-04-29 LAB — VANCOMYCIN, TROUGH: Vancomycin Tr: 18 ug/mL (ref 15–20)

## 2021-04-29 LAB — VANCOMYCIN, PEAK: Vancomycin Pk: 29 ug/mL — ABNORMAL LOW (ref 30–40)

## 2021-04-29 LAB — RESP PANEL BY RT-PCR (FLU A&B, COVID) ARPGX2
Influenza A by PCR: NEGATIVE
Influenza B by PCR: NEGATIVE
SARS Coronavirus 2 by RT PCR: NEGATIVE

## 2021-04-29 LAB — LACTATE DEHYDROGENASE: LDH: 568 U/L — ABNORMAL HIGH (ref 98–192)

## 2021-04-29 MED ORDER — SODIUM CHLORIDE 0.9% FLUSH: 10.0000 mL | Freq: Two times a day (BID) | INTRAVENOUS | Status: DC

## 2021-04-29 MED ORDER — ENSURE ENLIVE PO LIQD: 237.0000 mL | Freq: Two times a day (BID) | ORAL | Status: DC

## 2021-04-29 MED ORDER — ACETAMINOPHEN 650 MG RE SUPP: 650.0000 mg | RECTAL | Status: DC | PRN

## 2021-04-29 MED ORDER — PHENOL 1.4 % MT LIQD: 1.0000 | OROMUCOSAL | Status: DC | PRN

## 2021-04-29 MED ORDER — OXYCODONE-ACETAMINOPHEN 5-325 MG PO TABS: 2.0000 | ORAL_TABLET | Freq: Four times a day (QID) | ORAL | Status: DC | PRN

## 2021-04-29 MED ORDER — DOCUSATE SODIUM 100 MG PO CAPS: 100.0000 mg | ORAL_CAPSULE | Freq: Two times a day (BID) | ORAL | Status: DC

## 2021-04-29 MED ORDER — HYDROXYZINE HCL 25 MG PO TABS: 25.0000 mg | ORAL_TABLET | Freq: Three times a day (TID) | ORAL | 0 refills | Status: DC | PRN

## 2021-04-29 MED ORDER — SERTRALINE HCL 25 MG PO TABS: 25.0000 mg | ORAL_TABLET | Freq: Every day | ORAL | Status: DC

## 2021-04-29 MED ORDER — POLYETHYLENE GLYCOL 3350 17 G PO PACK: 17.0000 g | PACK | Freq: Every day | ORAL | Status: DC

## 2021-04-29 MED ORDER — INSULIN ASPART 100 UNIT/ML IJ SOLN
3.0000 [IU] | INTRAMUSCULAR | Status: DC
Start: 2021-04-29 — End: 2021-06-22

## 2021-04-29 MED ORDER — SODIUM CHLORIDE 0.9% IV SOLUTION: INTRAVENOUS | 0 refills | Status: DC

## 2021-04-29 MED ORDER — ORAL CARE MOUTH RINSE: 15.0000 mL | Freq: Two times a day (BID) | OROMUCOSAL | Status: DC

## 2021-04-29 MED ORDER — SODIUM CHLORIDE 0.9 % IV SOLN: 1.0000 g | Freq: Three times a day (TID) | INTRAVENOUS | Status: DC

## 2021-04-29 MED ORDER — INSULIN DETEMIR 100 UNIT/ML ~~LOC~~ SOLN: 8.0000 [IU] | Freq: Two times a day (BID) | SUBCUTANEOUS | Status: DC

## 2021-04-29 MED ORDER — ALUM & MAG HYDROXIDE-SIMETH 200-200-20 MG/5ML PO SUSP: 15.0000 mL | ORAL | Status: DC | PRN

## 2021-04-29 MED ORDER — INSULIN REGULAR(HUMAN) IN NACL 100-0.9 UT/100ML-% IV SOLN: INTRAVENOUS | Status: DC

## 2021-04-29 MED ORDER — TRAMADOL HCL 50 MG PO TABS: 50.0000 mg | ORAL_TABLET | Freq: Four times a day (QID) | ORAL | Status: DC | PRN

## 2021-04-29 MED ORDER — DEXTROSE 50 % IV SOLN: 0.0000 mL | INTRAVENOUS | Status: DC | PRN

## 2021-04-29 MED ORDER — SODIUM CHLORIDE 0.9 % IV SOLN: 250.0000 mL | INTRAVENOUS | Status: DC

## 2021-04-29 MED ORDER — DEXMEDETOMIDINE HCL IN NACL 400 MCG/100ML IV SOLN: 0.0000 ug/kg/h | INTRAVENOUS | Status: DC

## 2021-04-29 MED ORDER — MILRINONE LACTATE IN DEXTROSE 20-5 MG/100ML-% IV SOLN: 0.2500 ug/kg/min | INTRAVENOUS | Status: DC

## 2021-04-29 MED ORDER — SODIUM CHLORIDE 0.9 % IV SOLN: INTRAVENOUS | Status: DC

## 2021-04-29 MED ORDER — FAMOTIDINE 20 MG PO TABS: 20.0000 mg | ORAL_TABLET | Freq: Two times a day (BID) | ORAL | Status: DC

## 2021-04-29 MED ORDER — VANCOMYCIN HCL 750 MG/150ML IV SOLN
750.0000 mg | Freq: Two times a day (BID) | INTRAVENOUS | Status: DC
  Administered 2021-04-29: 13:00:00 750 mg via INTRAVENOUS
  Filled 2021-04-29: qty 150

## 2021-04-29 MED ORDER — MAGNESIUM OXIDE -MG SUPPLEMENT 400 (240 MG) MG PO TABS: 400.0000 mg | ORAL_TABLET | Freq: Every day | ORAL | Status: DC

## 2021-04-29 MED ORDER — VANCOMYCIN HCL 750 MG/150ML IV SOLN: 750.0000 mg | Freq: Two times a day (BID) | INTRAVENOUS | Status: DC

## 2021-04-29 MED ORDER — HEPARIN SODIUM (PORCINE) 5000 UNIT/ML IJ SOLN: INTRAVENOUS | Status: DC

## 2021-04-29 MED ORDER — AMIODARONE HCL IN DEXTROSE 360-4.14 MG/200ML-% IV SOLN: 30.0000 mg/h | INTRAVENOUS | Status: DC

## 2021-04-29 MED ORDER — NOREPINEPHRINE 16 MG/250ML-% IV SOLN: 0.0000 ug/min | INTRAVENOUS | Status: DC

## 2021-04-29 MED ORDER — FENTANYL CITRATE PF 50 MCG/ML IJ SOSY: 50.0000 ug | PREFILLED_SYRINGE | INTRAMUSCULAR | Status: DC | PRN

## 2021-04-29 MED ORDER — METHOCARBAMOL 500 MG PO TABS: 500.0000 mg | ORAL_TABLET | Freq: Four times a day (QID) | ORAL | Status: DC | PRN

## 2021-04-29 MED ORDER — GUAIFENESIN-DM 100-10 MG/5ML PO SYRP: 15.0000 mL | ORAL_SOLUTION | ORAL | 0 refills | Status: DC | PRN

## 2021-04-29 NOTE — Progress Notes (Addendum)
Report given to receiving nurse on 7E at [facility] as well as [facility] for transportation. Patient left [place] at 1600 with [facility].

## 2021-04-29 NOTE — Progress Notes (Signed)
Pharmacy Antibiotic Note  Victor [name] is a 60 y.o. male admitted on 04/19/2021 with Heart failure. Pharmacy has been consulted for vancomycin and meropenem dosing. Patient  with Increased  wbc 21 and fever 102  Cx negative thus far, PICC line removed.  Patient extubated 12/22  Cefepime broadened to meropenem with continued fevers. Scr stable 1.3-1.5  Vancomycin peak 29 and trough 18 AUC 600 on vancomycin 1gm q12h  Plan: Continue Meropenem 1g q8 hours Reduce Vancomycin 778m q12h  Height: 6\' 1"  (185.4 cm) Weight: 124.3 kg (274 lb 0.5 oz) IBW/kg (Calculated) : 79.9  Temp (24hrs), Avg:99.9 F (37.7 C), Min:98.8 F (37.1 C), Max:100.8 F (38.2 C)  Recent Labs  Lab 04/23/21 1958 04/23/21 2136 04/24/21 0300 04/25/21 0307 04/26/21 1705 04/26/21 1903 04/27/21 0013 04/27/21 0336 04/27/21 0412 04/28/21 0305 04/28/21 1117 04/29/21 0237 04/29/21 0507 04/29/21 0859  WBC  --   --  9.9   < >  --  18.2*  --  16.8*  --  20.6* 18.5*  --  21.0*  --   CREATININE 1.88*   < > 1.90*   < >  --  2.28*  --   --  1.98* 1.63* 1.30*  --  1.51*  --   LATICACIDVEN 1.1  --  1.0  --  5.7*  --  3.8* 1.8  --   --   --   --   --   --   VANCOTROUGH  --   --   --   --   --   --   --   --   --   --   --   --   --  18  VANCOPEAK  --   --   --   --   --   --   --   --   --   --   --  29*  --   --    < > = values in this interval not displayed.     Estimated Creatinine Clearance: 72.8 mL/min (A) (by C-G formula based on SCr of 1.51 mg/dL (H)).    No Known Allergies  Antimicrobials this admission: Vanc 12/20> Cefepime 12/20>12/22 Meropenem 12/22>>  Microbiology results: 12/21 TA: ngtd 12/20 Bcx ngtd 12/20 resp viral panel: nothing detected 12/14 MRSA pcr neg    Bonnita Nasuti Pharm.D. CPP, BCPS Clinical Pharmacist [phone] 04/29/2021 11:37 AM

## 2021-04-29 NOTE — Discharge Summary (Signed)
Advanced Heart Failure Team  Discharge Summary   Patient ID: Victor [name] MRN: [id number], DOB/AGE: 01-01-1961 60 y.o. Admit date: 04/19/2021 D/C date:     04/29/2021   Primary Discharge Diagnoses: 1. Acute on chronic systolic HF -> cardiogenic shock - s/p Impella 5.5 placement 12/22 - EF 20-25%. Moderate to severe RV dysfunction - Blood type A+ 2. NICM  - cath 11/20 3. Paroxysmal AF with RVR  - new onset. Failed DC-CV 12/16 4. AKI  - peak Scr 2.3 -> 1.5 with hemodynamic support (baseline SCr 1.1-1.3) 5. Morbid obesity - Body mass index is 36.15 kg/m. 6. Polycythemia - bone marrow biopsy ok  7. Hyponatremia 8. Fever - bcx/ucx negative - on vanc/meropenem 9. Hematoma at Impella site 10. NSVT 11. H/o traumatic wound to LLE with skin grafting  Hospital Course:   60 y/o male with morbid obesity, polycythemia, obesity and chronic systolic HF due to long-standing NICM EF 25%. Cath in 2014 and 11/20. No CAD  Admitted from HF Clinic on 12/14 with cardiogenic shock in setting of new onset AF. Initial mixed venous sat 38%. Started on NE, milrinone and IV amio. Underwent TEE/DC-CV on 12/16. EF 20-25% with moderate to severe RV dysfunction and severe functional MR. Converted to NSR for a few hours then back to AF. Developed worsening shock and AKI. Unable to stabilize medically. Underwent Impella 5.5 placement on 12/22. Post Impella course complicated by axillary hematoma and low-grade fevers. Cultures negative. Treated with vanc/meropenem.   Stabilized will with Impella 5.5. Now on milrinone 0.25 and NE 5 (decreased) with thermo CO/CI 5.8/2.4. Remains in rapid AF on IV amio.   On heparin in Impella purge only (systemic heparin stopped due to hematoma). Restart as tolerated.  Able to get up to chair with support. Wife at bedside.   Discharge Vitals: Blood pressure 131/74, pulse (!) 105, temperature 99.9 F (37.7 C), resp. rate 20, height _0  (1.854 m), weight 124.3 kg, SpO2 93  %. General:  Sitting up in bed. No resp difficulty HEENT: normal Neck: supple. RIJ swan Cor: L axillary Impella site with stable hematoma/bruising  Irregular tachy Lungs: clear Abdomen: obese  soft, nontender, nondistended. No hepatosplenomegaly. No bruits or masses. Good bowel sounds. Extremities: no cyanosis, clubbing, rash, edema  LLE with chronic deformity Neuro: alert & orientedx3, cranial nerves grossly intact. moves all 4 extremities w/o difficulty. Affect pleasant  Labs: Lab Results  Component Value Date   WBC 21.0 (H) 04/29/2021   HGB 12.5 (L) 04/29/2021   HCT 36.8 (L) 04/29/2021   MCV 88.2 04/29/2021   PLT 103 (L) 04/29/2021    Recent Labs  Lab 04/27/21 0412 04/27/21 0443 04/29/21 0507  NA 128*   < > 129*  K 4.8   < > 3.9  CL 94*   < > 96*  CO2 23   < > 25  BUN 38*   < > 26*  CREATININE 1.98*   < > 1.51*  CALCIUM 8.6*   < > 8.7*  PROT 6.7  --   --   BILITOT 2.2*  --   --   ALKPHOS 30*  --   --   ALT 26  --   --   AST 29  --   --   GLUCOSE 123*   < > 135*   < > = values in this interval not displayed.   Lab Results  Component Value Date   CHOL 127 04/25/2021   HDL 28 (L) 04/25/2021   [place]  83 04/25/2021   TRIG 80 04/25/2021   BNP (last 3 results) Recent Labs    01/23/21 1104 03/08/21 1605 04/19/21 0954  BNP 119.8* 390.0* 154.2*    ProBNP (last 3 results) No results for input(s): PROBNP in the last 8760 hours.   Diagnostic Studies/Procedures   VAS US DOPPLER PRE VAD  Result Date: 04/28/2021 PERIOPERATIVE VASCULAR EVALUATION Patient Name:  BHAVYA GRAND  Date of Exam:   04/27/2021 Medical Rec #: [id number]         Accession #:    [id number] Date of Birth: July 11, 1960         Patient Gender: M Patient Age:   63 years Exam Location:  [facility] Procedure:      VAS US DOPPLER PRE VAD Referring Phys: Quillian Quince Chaya Dehaan --------------------------------------------------------------------------------  Indications:      Pre-surgical evaluation  for LVAD. Cold left foot. Risk Factors:     Hypertension. Other Factors:    Ischemic cardiomyopathy. On vasopressors. Limitations:      Arrhythmia, Impella, Line & Bandaging Right Neck Comparison Study: No prior study Performing Technologist: Maudry Mayhew MHA, RVT, RDCS, RDMS  Examination Guidelines: A complete evaluation includes B-mode imaging, spectral Doppler, color Doppler, and power Doppler as needed of all accessible portions of each vessel. Bilateral testing is considered an integral part of a complete examination. Limited examinations for reoccurring indications may be performed as noted.  Right Carotid Findings: +--------+--------+--------+--------+--------+----------------------------+           PSV cm/s EDV cm/s Stenosis Describe Comments                      +--------+--------+--------+--------+--------+----------------------------+  CCA Prox                                     Right line/bandaging at neck  +--------+--------+--------+--------+--------+----------------------------+  ICA Prox                                     Right line/bandaging at neck  +--------+--------+--------+--------+--------+----------------------------+ Unable to access right side due to line/bandaging. Left Carotid Findings: +----------+--------+--------+--------+--------+------------------+             PSV cm/s EDV cm/s Stenosis Describe Comments            +----------+--------+--------+--------+--------+------------------+  CCA Prox   62       21                                             +----------+--------+--------+--------+--------+------------------+  CCA Distal 44       17                                             +----------+--------+--------+--------+--------+------------------+  ICA Prox   17       10                         intimal thickening  +----------+--------+--------+--------+--------+------------------+  ICA Distal 40       23                                              +----------+--------+--------+--------+--------+------------------+  ECA        21       7                                              +----------+--------+--------+--------+--------+------------------+ +----------+--------+--------+----------------+------------+  Subclavian PSV cm/s EDV cm/s Describe         Arm Pressure  +----------+--------+--------+----------------+------------+             69                Multiphasic, WNL 106           +----------+--------+--------+----------------+------------+ +---------+--------+--+--------+-+---------+  Vertebral PSV cm/s 17 EDV cm/s 6 Antegrade  +---------+--------+--+--------+-+---------+  ABI Findings: +---------+------------------+-----+--------+--------------------+  Right     Rt Pressure (mmHg) Index Waveform Comment               +---------+------------------+-----+--------+--------------------+  Brachial                                    Restricted extremity  +---------+------------------+-----+--------+--------------------+  PTA       107                1.01                                 +---------+------------------+-----+--------+--------------------+  DP        123                1.16                                 +---------+------------------+-----+--------+--------------------+  Great Toe                          Absent                         +---------+------------------+-----+--------+--------------------+ +---------+------------------+-----+--------+-------+  Left      Lt Pressure (mmHg) Index Waveform Comment  +---------+------------------+-----+--------+-------+  Brachial  106                                        +---------+------------------+-----+--------+-------+  PTA       88                 0.83                    +---------+------------------+-----+--------+-------+  DP                                 absent            +---------+------------------+-----+--------+-------+  Great Toe                          Absent             +---------+------------------+-----+--------+-------+ +-------+---------------+----------------+  ABI/TBI Today's ABI/TBI Previous ABI/TBI  +-------+---------------+----------------+  Right   1.16                              +-------+---------------+----------------+  Left    0.83                              +-------+---------------+----------------+ Unable to accurately evaluate waveforms due to Impella.  Summary: Right Carotid: Unable to access right side due to line/bandaging. Left Carotid: The extracranial vessels were near-normal with only minimal wall               thickening or plaque. Vertebrals:  Left vertebral artery demonstrates antegrade flow. Subclavians: Normal flow hemodynamics were seen in the left subclavian artery.  *See table(s) above for measurements and observations. Right ABI: Resting right ankle-brachial index is within normal range. No evidence of significant right lower extremity arterial disease. Left ABI: Resting left ankle-brachial index indicates mild left lower extremity arterial disease.  Electronically signed by Jamelle Haring on 04/28/2021 at 2:35:03 PM.    Final    VAS [place] LOWER EXTREMITY VENOUS (DVT)  Result Date: 04/28/2021  Lower Venous DVT Study Patient Name:  QUANG THORPE  Date of Exam:   04/27/2021 Medical Rec #: [id number]         Accession #:    [id number] Date of Birth: 10-02-1960         Patient Gender: M Patient Age:   19 years Exam Location:  [facility] Procedure:      VAS [place] LOWER EXTREMITY VENOUS (DVT) Referring Phys: Quillian Quince Correll Denbow --------------------------------------------------------------------------------  Indications: Pre-VAD.  Comparison Study: No prior studies. Performing Technologist: Darlin Coco RDMS, RVT  Examination Guidelines: A complete evaluation includes B-mode imaging, spectral Doppler, color Doppler, and power Doppler as needed of all accessible portions of each vessel. Bilateral testing is considered an integral part of a  complete examination. Limited examinations for reoccurring indications may be performed as noted. The reflux portion of the exam is performed with the patient in reverse Trendelenburg.  +---------+---------------+---------+-----------+----------+--------------+  RIGHT     Compressibility Phasicity Spontaneity Properties Thrombus Aging  +---------+---------------+---------+-----------+----------+--------------+  CFV       Full            Yes       Yes                                    +---------+---------------+---------+-----------+----------+--------------+  SFJ       Full                                                             +---------+---------------+---------+-----------+----------+--------------+  FV Prox   Full                                                             +---------+---------------+---------+-----------+----------+--------------+  FV Mid    Full                                                             +---------+---------------+---------+-----------+----------+--------------+  FV Distal Full                                                             +---------+---------------+---------+-----------+----------+--------------+  PFV       Full                                                             +---------+---------------+---------+-----------+----------+--------------+  POP       Full            Yes       Yes                                    +---------+---------------+---------+-----------+----------+--------------+  PTV       Full                                                             +---------+---------------+---------+-----------+----------+--------------+  PERO      Full                                                             +---------+---------------+---------+-----------+----------+--------------+   +---------+---------------+---------+-----------+----------+-------------------+  LEFT      Compressibility Phasicity Spontaneity Properties Thrombus Aging        +---------+---------------+---------+-----------+----------+-------------------+  CFV       Full            Yes       Yes                                         +---------+---------------+---------+-----------+----------+-------------------+  SFJ       Full                                                                  +---------+---------------+---------+-----------+----------+-------------------+  FV Prox   Full                                                                  +---------+---------------+---------+-----------+----------+-------------------+  FV Mid    Full                                                                  +---------+---------------+---------+-----------+----------+-------------------+  FV Distal Full                                                                  +---------+---------------+---------+-----------+----------+-------------------+  PFV       Full                                                                  +---------+---------------+---------+-----------+----------+-------------------+  POP       Full            Yes       Yes                                         +---------+---------------+---------+-----------+----------+-------------------+  PTV       Full                                                                  +---------+---------------+---------+-----------+----------+-------------------+  PERO      Full                                             Visibility of some                                                               segments limited                                                                 due to skin changes                                                              secondary to graft.  +---------+---------------+---------+-----------+----------+-------------------+     Summary: RIGHT: - There is no evidence of deep vein thrombosis in the lower extremity.  - No cystic structure found in the popliteal fossa.  LEFT: - There is no  evidence of deep vein thrombosis in the lower extremity.  - No cystic structure found in the popliteal fossa.  *See table(s) above  for measurements and observations. Electronically signed by Jamelle Haring on 04/28/2021 at 2:39:28 PM.    Final     Discharge Medications   Allergies as of 04/29/2021   No Known Allergies      Medication List     STOP taking these medications    aspirin EC 81 MG tablet   Azopt 1 % ophthalmic suspension Generic drug: brinzolamide   carvedilol 25 MG tablet Commonly known as: COREG   DULoxetine 60 MG capsule Commonly known as: CYMBALTA   Entresto 97-103 MG Generic drug: sacubitril-valsartan   Farxiga 10 MG Tabs tablet Generic drug: dapagliflozin propanediol   furosemide 40 MG tablet Commonly known as: LASIX   guaiFENesin 600 MG 12 hr tablet Commonly known as: MUCINEX   metolazone 2.5 MG tablet Commonly known as: ZAROXOLYN   oxyCODONE 15 MG immediate release tablet Commonly known as: ROXICODONE   potassium chloride SA 20 MEQ tablet Commonly known as: KLOR-CON M   simvastatin 40 MG tablet Commonly known as: ZOCOR       TAKE these medications    acetaminophen 650 MG suppository Commonly known as: TYLENOL Place 1 suppository (650 mg total) rectally every 4 (four) hours as needed for fever.   albuterol 108 (90 Base) MCG/ACT inhaler Commonly known as: VENTOLIN HFA Inhale 2 puffs into the lungs every 6 (six) hours as needed for wheezing or shortness of breath.   ALPRAZolam 0.5 MG tablet Commonly known as: XANAX Take 1 tablet (0.5 mg total) by mouth daily as needed for anxiety.   amiodarone 360-4.14 MG/200ML-% Soln Commonly known as: NEXTERONE PREMIX Inject 30 mg/hr into the vein continuous.   feeding supplement Liqd Take 237 mLs by mouth 2 (two) times daily between meals.   heparin 50,000 Units in dextrose 5 % 990 mL Heparin Purge solution for impella   hydrOXYzine 25 MG tablet Commonly known as: ATARAX Take 1 tablet (25  mg total) by mouth 3 (three) times daily as needed for anxiety.   insulin aspart 100 UNIT/ML injection Commonly known as: novoLOG Inject 3-9 Units into the skin every 4 (four) hours.   insulin detemir 100 UNIT/ML injection Commonly known as: LEVEMIR Inject 0.08 mLs (8 Units total) into the skin every 12 (twelve) hours.   magnesium oxide 400 (240 Mg) MG tablet Commonly known as: MAG-OX Take 1 tablet (400 mg total) by mouth daily. Start taking on: April 30, 2021   meropenem 1 g in sodium chloride 0.9 % 100 mL Inject 1 g into the vein every 8 (eight) hours.   methocarbamol 500 MG tablet Commonly known as: ROBAXIN Take 1 tablet (500 mg total) by mouth every 6 (six) hours as needed for muscle spasms.   milrinone 20 MG/100 ML Soln infusion Commonly known as: PRIMACOR Inject 0.0303 mg/min into the vein continuous.   montelukast 10 MG tablet Commonly known as: SINGULAIR Take 10 mg by mouth at bedtime.   norepinephrine 16-5 MG/250ML-% Soln Commonly known as: LEVOPHED Inject 0-40 mcg/min into the vein continuous.   oxyCODONE-acetaminophen 5-325 MG tablet Commonly known as: PERCOCET/ROXICET Take 2 tablets by mouth every 6 (six) hours as needed for moderate pain or severe pain.   pregabalin 150 MG capsule Commonly known as: LYRICA TAKE 1 CAPSULE BY MOUTH THREE TIMES A DAY What changed:  how much to take how to take this when to take this additional instructions   sertraline 25 MG tablet Commonly known as: ZOLOFT Take 1 tablet (25 mg total) by mouth daily. Start taking on:  April 30, 2021   traMADol 50 MG tablet Commonly known as: ULTRAM Take 1 tablet (50 mg total) by mouth every 6 (six) hours as needed for severe pain. Start taking on: May 02, 2021   vancomycin 750 MG/150ML Soln Commonly known as: VANCOREADY Inject 150 mLs (750 mg total) into the vein every 12 (twelve) hours. Start taking on: April 30, 2021        Disposition   The patient will be  transferred to [facility] for cardiac transplant evaluation.       Duration of Discharge Encounter: Greater than 35 minutes   Signed, Glori Bickers MD 04/29/2021, 3:07 PM

## 2021-04-29 NOTE — Progress Notes (Addendum)
Patient ID: Victor [name], male   DOB: [birth year], 60 y.o.   MRN: [id number]     Advanced Heart Failure Rounding Note  PCP-Cardiologist: Larae Grooms, MD  [facility]: Dr. [name]   Subjective:    12/14: Admitted w/ a/c CHF w/ low output and new Afib w/ RVR. Co-ox 38%. Placed on NE, milrinone and amio gtt. Echo EF 20-25%, RV normal. Mod MR.  TEE-guided DCCV to NSR on 12/16, TEE with EF 20-25%, moderate to severe RV dysfunction, severe functional MR.  12/20: Febrile to 102. Bld CX x2- NGTD. Respiratory panel negative. Started empiric antibiotics.  12/21: S/P Impella 5.5  12/22: hematoma at Impella site  Remains on Impella at P9 Flow 4.6-4.7L On milrinone 0.25 and NE 5  Swan #s  CVP 5 PA 48/23  Papi 2.57  CO 5.8 CI 2.4  Still c/o pain at Impella insertion site.Off heparin (only in purge now).   Tm 100.2 On vanc/meropenem. Cx negative.  Scr 2.3 -> 1.6 -> 1.5   Objective:   Weight Range: 124.3 kg Body mass index is 36.15 kg/m.   Vital Signs:   Temp:  [98.6 F (37 C)-100.8 F (38.2 C)] 99.5 F (37.5 C) (12/24 0845) Pulse Rate:  [83-121] 102 (12/24 0908) Resp:  [15-37] 27 (12/24 0845) BP: (106-125)/(84-101) 125/98 (12/24 0800) SpO2:  [91 %-98 %] 96 % (12/24 0845) Arterial Line BP: (91-179)/(68-122) 111/78 (12/24 0845) Weight:  [124.3 kg] 124.3 kg (12/24 0546) Last BM Date: 04/28/21  Weight change: Filed Weights   04/27/21 0755 04/28/21 0500 04/29/21 0546  Weight: 127.8 kg 127.7 kg 124.3 kg    Intake/Output:   Intake/Output Summary (Last 24 hours) at 04/29/2021 0909 Last data filed at 04/29/2021 0800 Gross per 24 hour  Intake 1619 ml  Output 3490 ml  Net -1871 ml       Physical Exam   General:  Sitting up in bed  No resp difficulty HEENT: normal Neck: supple. RIJ swan Carotids 2+ bilat; no bruits. No lymphadenopathy or thryomegaly appreciated. Cor: + R axillary impella site + hematoma PMI nondisplaced. Irregular tachy Lungs: clear Abdomen: soft,  nontender, nondistended. No hepatosplenomegaly. No bruits or masses. Good bowel sounds. Extremities: no cyanosis, clubbing, rash, edema  LLE chronic deformity Neuro: alert & orientedx3, cranial nerves grossly intact. moves all 4 extremities w/o difficulty. Affect pleasant   Telemetry    A Fib 100-110 Personally reviewed  Labs    CBC Recent Labs    04/28/21 1117 04/29/21 0507  WBC 18.5* 21.0*  HGB 12.2* 12.5*  HCT 35.1* 36.8*  MCV 87.3 88.2  PLT PLATELET CLUMPS NOTED ON SMEAR, COUNT APPEARS DECREASED 103*    Basic Metabolic Panel Recent Labs    04/28/21 0305 04/28/21 1117 04/29/21 0507  NA 123* 125* 129*  K 4.3 3.7 3.9  CL 88* 91* 96*  CO2 24 24 25   GLUCOSE 171* 175* 135*  BUN 25* 23* 26*  CREATININE 1.63* 1.30* 1.51*  CALCIUM 8.4* 8.5* 8.7*  MG 2.0  --  2.4    Liver Function Tests Recent Labs    04/26/21 1903 04/27/21 0412  AST 32 29  ALT 24 26  ALKPHOS 31* 30*  BILITOT 1.7* 2.2*  PROT 6.4* 6.7  ALBUMIN 3.5 3.5     No results for input(s): LIPASE, AMYLASE in the last 72 hours. Cardiac Enzymes No results for input(s): CKTOTAL, CKMB, CKMBINDEX, TROPONINI in the last 72 hours.  BNP: BNP (last 3 results) Recent Labs    01/23/21 1104 03/08/21  1605 04/19/21 0954  BNP 119.8* 390.0* 154.2*     ProBNP (last 3 results) No results for input(s): PROBNP in the last 8760 hours.   D-Dimer No results for input(s): DDIMER in the last 72 hours.  Hemoglobin A1C Recent Labs    04/26/21 1903  HGBA1C 5.7*    Fasting Lipid Panel No results for input(s): CHOL, HDL, LDLCALC, TRIG, CHOLHDL, LDLDIRECT in the last 72 hours.  Thyroid Function Tests No results for input(s): TSH, T4TOTAL, T3FREE, THYROIDAB in the last 72 hours.  Invalid input(s): FREET3    Other results:   Imaging    No results found.   Medications:     Scheduled Medications:  alteplase  2 mg Intracatheter Once   Chlorhexidine Gluconate Cloth  6 each Topical Daily   digoxin   0.125 mg Oral Daily   docusate sodium  100 mg Oral BID   famotidine  20 mg Oral BID   feeding supplement  237 mL Oral BID BM   insulin aspart  3-9 Units Subcutaneous Q4H   insulin detemir  8 Units Subcutaneous Q12H   magnesium oxide  400 mg Oral Daily   mouth rinse  15 mL Mouth Rinse BID   montelukast  10 mg Oral QHS   multivitamin with minerals  1 tablet Oral Daily   polyethylene glycol  17 g Oral Daily   pregabalin  150 mg Oral TID   sertraline  25 mg Oral Daily   sodium chloride flush  10-40 mL Intracatheter Q12H   sodium chloride flush  10-40 mL Intracatheter Q12H   sodium chloride flush  3 mL Intravenous Q12H    Infusions:  sodium chloride     sodium chloride 10 mL/hr at 04/26/21 0800   sodium chloride     amiodarone 30 mg/hr (04/29/21 0800)   dexmedetomidine (PRECEDEX) IV infusion Stopped (04/27/21 1010)   epinephrine Stopped (04/28/21 1047)   heparin 50 units/mL (Impella PURGE) in dextrose 5 % 1000 mL bag     insulin Stopped (04/27/21 1304)   meropenem (MERREM) IV Stopped (04/29/21 0606)   milrinone 0.25 mcg/kg/min (04/29/21 0800)   norepinephrine 5 mcg/min (04/29/21 0800)   vancomycin Stopped (04/28/21 2338)   vasopressin Stopped (04/28/21 1447)    PRN Medications: sodium chloride, sodium chloride, Place/Maintain arterial line **AND** sodium chloride, acetaminophen, acetaminophen, albuterol, alum & mag hydroxide-simeth, dextrose, fentaNYL (SUBLIMAZE) injection, guaiFENesin-dextromethorphan, hydrOXYzine, methocarbamol, oxyCODONE-acetaminophen, phenol, sodium chloride flush, sodium chloride flush, sodium chloride flush, [START ON 05/02/2021] traMADol   Assessment/Plan   1. Acute on Chronic Systolic Heart Failure>>Cardiogenic Shock  - diagnosed in 2014. Cath at that time normal cors with EF 20-25% - Echo 10/20 EF 20-25% RV ok. Moderate to severe MR - Cath 11/20 coronaries normal - Echo 9/22 EF 25% RV ok. Mild MR - Admitted w/ NYHA IV symptoms and low output in  setting of new Afib w/ RVR. Initial Co-ox 38%>>Milrinone and NE added - Echo EF 20-25%, RV ok. Mod MR.  TEE EF 20-25%, moderate to severe RV dysfunction, severe functional MR.   - TEE-guided DCCV to NSR 12/16 but back in atrial fibrillation with RVR shortly afterwards.  Remains in AF with RVR - 12/21 S/P Impella 5.5 --> flow 4.3 --> on 12/22 increased to P9  - Had hematoma around site.  - Hemodynamics improved with Impella/milrinone/NE. Wean NE as tolerated - Scr down to 1.5. Plan transfer to [facility] for transplant eval as he stabilizes  - Continue to mobilize - Blood type A+  2. Atrial Fibrillation w/ RVR - New onset. Duration unknown.  - Failed TEE/DCCV to NSR 12/16  - Continue amio - Currently off heparin drip with hematoma. Hopefully restart today.    3. AKI due to ATN - Baseline SCr ~1.1-1.3 - SCr -> 2.3>2>1.6 -> 1.5 - suspect 2/2 cardiorenal/low output - support hemodynamics   4. Polycythemia - Sleep study not overly impressive. - Being followed at [facility] for polycythemia. Work-up including JAK2 mutation and BMBx have been negative. Gets therapeutic phlebotomy as outpatient   5. Mitral Regurgitation  - Severe functional MR on TEE.  If he stabilizes out, would repeat echo in NSR and consider Mitraclip if remains severe.   6. NSVT - continue amio gtt cut back to amio 30 mg per hour.  - Keep K > 4.0 and Mg >2.0  - Stable today.   7. Hypokalemia - Follow BMET daily   8. Anxiety - Significant, on Xanax.   9. Fever -12/20 Blood Cx x 2  - Bcx NGTD.   - Respiratory panel - negative.  - Still with low grade fevrs - Continue empiric antibiotics with vancomycin and meropenum.   10. Hypnatremia - Sodium 123 -> 129.  - Restrict FW  11. Hematoma at Impella site - appears stable - follow. Pain control  CRITICAL CARE Performed by: Glori Bickers  Total critical care time: 45 minutes  Critical care time was exclusive of separately billable procedures and  treating other patients.  Critical care was necessary to treat or prevent imminent or life-threatening deterioration.  Critical care was time spent personally by me (independent of midlevel providers or residents) on the following activities: development of treatment plan with patient and/or surrogate as well as nursing, discussions with consultants, evaluation of patient's response to treatment, examination of patient, obtaining history from patient or surrogate, ordering and performing treatments and interventions, ordering and review of laboratory studies, ordering and review of radiographic studies, pulse oximetry and re-evaluation of patient's condition.   Glori Bickers MD 9:09 AM

## 2021-04-29 NOTE — Progress Notes (Addendum)
ANTICOAGULATION CONSULT NOTE - Follow Up Consult  Pharmacy Consult for heparin Indication:  Impella  Labs: Recent Labs    04/26/21 1903 04/27/21 0129 04/27/21 0336 04/28/21 0305 04/28/21 0516 04/28/21 1117 04/29/21 0507  HGB 14.7  --    < > 12.6*  --  12.2* 12.5*  HCT 44.3  --    < > 37.7*  --  35.1* 36.8*  PLT 183  --    < > 120*  --  PLATELET CLUMPS NOTED ON SMEAR, COUNT APPEARS DECREASED 103*  LABPROT 16.1*  --   --   --   --   --   --   INR 1.3*  --   --   --   --   --   --   HEPARINUNFRC  --  0.89*  --   --  <0.10*  --  <0.10*  CREATININE 2.28*  --    < > 1.63*  --  1.30* 1.51*   < > = values in this interval not displayed.     Assessment: 60yo male on heparin after Impella placed, systemic heparin adjusted to account for Impella heparin purge.  Patient now on heparin purge alone after having a large hematoma at imeplla site needing to hold pressure multiple times. Impella flows have been stable. No overt bleeding Purge pressure stable 400-500  Hgb down slightly 14>12  plt count trended down from 180>100s and LDH also trended up to 500   Heparin purge 7 ml/hr = 350 uts/hr   Goal of Therapy:  Heparin level 0.3-0.5 units/ml   Plan:  Continue heparin purge only for now  Follow up hematoma improvement for resume systemic heparin    Bonnita Nasuti Pharm.D. CPP, BCPS Clinical Pharmacist [phone] 04/29/2021 11:24 AM

## 2021-04-29 NOTE — Plan of Care (Signed)
°  Problem: Cardiac: Goal: Ability to achieve and maintain adequate cardiopulmonary perfusion will improve Outcome: Progressing   Problem: Education: Goal: Knowledge of General Education information will improve Description: Including pain rating scale, medication(s)/side effects and non-pharmacologic comfort measures Outcome: Progressing   Problem: Health Behavior/Discharge Planning: Goal: Ability to manage health-related needs will improve Outcome: Progressing   Problem: Clinical Measurements: Goal: Ability to maintain clinical measurements within normal limits will improve Outcome: Progressing Goal: Will remain free from infection Outcome: Progressing Goal: Diagnostic test results will improve Outcome: Progressing Goal: Respiratory complications will improve Outcome: Progressing Goal: Cardiovascular complication will be avoided Outcome: Progressing   Problem: Nutrition: Goal: Adequate nutrition will be maintained Outcome: Progressing   Problem: Coping: Goal: Level of anxiety will decrease Outcome: Progressing   Problem: Elimination: Goal: Will not experience complications related to bowel motility Outcome: Progressing Goal: Will not experience complications related to urinary retention Outcome: Progressing

## 2021-04-30 LAB — CULTURE, BLOOD (ROUTINE X 2)
Culture: NO GROWTH
Culture: NO GROWTH
Special Requests: ADEQUATE
Special Requests: ADEQUATE

## 2021-05-01 LAB — DRVVT MIX: dRVVT Mix: 49.2 s — ABNORMAL HIGH (ref 0.0–40.4)

## 2021-05-01 LAB — LUPUS ANTICOAGULANT PANEL
DRVVT: 67.6 s — ABNORMAL HIGH (ref 0.0–47.0)
PTT Lupus Anticoagulant: 49.1 s (ref 0.0–51.9)

## 2021-05-01 LAB — DRVVT CONFIRM: dRVVT Confirm: 1.2 ratio (ref 0.8–1.2)

## 2021-05-02 LAB — FACTOR 5 LEIDEN

## 2021-05-07 HISTORY — PX: HEART TRANSPLANT: SHX268

## 2021-05-13 ENCOUNTER — Other Ambulatory Visit: Payer: Self-pay | Admitting: Internal Medicine

## 2021-05-16 ENCOUNTER — Telehealth: Payer: Self-pay | Admitting: Hematology and Oncology

## 2021-05-16 NOTE — Telephone Encounter (Signed)
Sch per 1/10 inbasket, pt aware °

## 2021-05-18 ENCOUNTER — Other Ambulatory Visit: Payer: Medicare Other

## 2021-05-18 ENCOUNTER — Telehealth: Payer: Self-pay | Admitting: Registered Nurse

## 2021-05-18 NOTE — Telephone Encounter (Signed)
Patient said he spoke with you the other day and doesn't remember what you guys talked about.  He would like for [facility] to call him back.

## 2021-05-18 NOTE — Telephone Encounter (Signed)
Return Victor [name] call,  Discharge Summary was reviewed.  Oxycodone 15 mg, his usual dose was discontinued and he was prescribed Oxycodone 5 mg.  Victor [name] has an appointment next week with surgeon, he will call or send a My-Chart message next week, he verbalizes understanding.

## 2021-05-22 ENCOUNTER — Ambulatory Visit: Payer: Medicare Other | Admitting: Registered Nurse

## 2021-05-25 ENCOUNTER — Encounter: Payer: Self-pay | Admitting: Registered Nurse

## 2021-05-25 ENCOUNTER — Other Ambulatory Visit: Payer: Self-pay

## 2021-05-25 ENCOUNTER — Encounter: Payer: Medicare Other | Attending: Physical Medicine and Rehabilitation | Admitting: Registered Nurse

## 2021-05-25 VITALS — BP 139/95 | HR 96 | Ht 72.0 in | Wt 269.0 lb

## 2021-05-25 DIAGNOSIS — F418 Other specified anxiety disorders: Secondary | ICD-10-CM | POA: Diagnosis not present

## 2021-05-25 DIAGNOSIS — S8412XS Injury of peroneal nerve at lower leg level, left leg, sequela: Secondary | ICD-10-CM | POA: Insufficient documentation

## 2021-05-25 DIAGNOSIS — G894 Chronic pain syndrome: Secondary | ICD-10-CM | POA: Insufficient documentation

## 2021-05-25 DIAGNOSIS — M1732 Unilateral post-traumatic osteoarthritis, left knee: Secondary | ICD-10-CM | POA: Diagnosis not present

## 2021-05-25 DIAGNOSIS — Z79891 Long term (current) use of opiate analgesic: Secondary | ICD-10-CM | POA: Insufficient documentation

## 2021-05-25 DIAGNOSIS — M25571 Pain in right ankle and joints of right foot: Secondary | ICD-10-CM | POA: Insufficient documentation

## 2021-05-25 DIAGNOSIS — M25562 Pain in left knee: Secondary | ICD-10-CM | POA: Insufficient documentation

## 2021-05-25 DIAGNOSIS — G8929 Other chronic pain: Secondary | ICD-10-CM | POA: Insufficient documentation

## 2021-05-25 DIAGNOSIS — M25572 Pain in left ankle and joints of left foot: Secondary | ICD-10-CM | POA: Insufficient documentation

## 2021-05-25 DIAGNOSIS — Z5181 Encounter for therapeutic drug level monitoring: Secondary | ICD-10-CM | POA: Insufficient documentation

## 2021-05-25 MED ORDER — OXYCODONE HCL 15 MG PO TABS
15.0000 mg | ORAL_TABLET | Freq: Three times a day (TID) | ORAL | 0 refills | Status: DC
Start: 2021-05-25 — End: 2021-07-06

## 2021-05-25 MED ORDER — ALPRAZOLAM 0.5 MG PO TABS: 0.5000 mg | ORAL_TABLET | Freq: Two times a day (BID) | ORAL | 1 refills | Status: DC | PRN
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
## 2021-06-16 ENCOUNTER — Telehealth: Payer: Self-pay | Admitting: Registered Nurse

## 2021-06-16 ENCOUNTER — Other Ambulatory Visit: Payer: Self-pay | Admitting: Registered Nurse

## 2021-06-16 NOTE — Telephone Encounter (Signed)
Placed a call to Victor [name], no answer. He sated on his voicemail, not to leave a message.

## 2021-06-19 NOTE — Telephone Encounter (Signed)
PMP was Reviewed.  [facility] was called Lyrica e-scribed today Placed a call to Mr. Stanbrough it went to his voicemail, unable to leave message.  Sent a my-Chart message to Mr. Clock regarding the above.
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
## 2021-06-22 ENCOUNTER — Inpatient Hospital Stay: Payer: Medicare Other

## 2021-06-22 ENCOUNTER — Other Ambulatory Visit: Payer: Self-pay

## 2021-06-22 ENCOUNTER — Inpatient Hospital Stay: Payer: Medicare Other | Attending: Hematology and Oncology | Admitting: Hematology and Oncology

## 2021-06-22 VITALS — BP 154/88 | HR 88 | Temp 98.1°F | Resp 16 | Ht 72.0 in | Wt 273.9 lb

## 2021-06-22 DIAGNOSIS — D751 Secondary polycythemia: Secondary | ICD-10-CM | POA: Diagnosis present

## 2021-06-22 LAB — CBC WITH DIFFERENTIAL/PLATELET
Abs Immature Granulocytes: 0.16 10*3/uL — ABNORMAL HIGH (ref 0.00–0.07)
Basophils Absolute: 0 10*3/uL (ref 0.0–0.1)
Basophils Relative: 1 %
Eosinophils Absolute: 0.1 10*3/uL (ref 0.0–0.5)
Eosinophils Relative: 1 %
HCT: 37.6 % — ABNORMAL LOW (ref 39.0–52.0)
Hemoglobin: 12 g/dL — ABNORMAL LOW (ref 13.0–17.0)
Immature Granulocytes: 3 %
Lymphocytes Relative: 11 %
Lymphs Abs: 0.5 10*3/uL — ABNORMAL LOW (ref 0.7–4.0)
MCH: 31.7 pg (ref 26.0–34.0)
MCHC: 31.9 g/dL (ref 30.0–36.0)
MCV: 99.2 fL (ref 80.0–100.0)
Monocytes Absolute: 0.5 10*3/uL (ref 0.1–1.0)
Monocytes Relative: 11 %
Neutro Abs: 3.7 10*3/uL (ref 1.7–7.7)
Neutrophils Relative %: 73 %
Platelets: 141 10*3/uL — ABNORMAL LOW (ref 150–400)
RBC: 3.79 MIL/uL — ABNORMAL LOW (ref 4.22–5.81)
RDW: 16.4 % — ABNORMAL HIGH (ref 11.5–15.5)
WBC: 5 10*3/uL (ref 4.0–10.5)
nRBC: 0 % (ref 0.0–0.2)

## 2021-07-06 ENCOUNTER — Telehealth: Payer: Self-pay | Admitting: Registered Nurse

## 2021-07-06 MED ORDER — OXYCODONE HCL 15 MG PO TABS: 15.0000 mg | ORAL_TABLET | Freq: Three times a day (TID) | ORAL | 0 refills | Status: DC

## 2021-07-06 NOTE — Telephone Encounter (Signed)
PMP was Reviewed.  ?Oxycodone e- scribed today, Victor [name] is aware via My- Chart Message.  ?

## 2021-07-13 ENCOUNTER — Emergency Department (HOSPITAL_COMMUNITY)
Admission: EM | Admit: 2021-07-13 | Discharge: 2021-07-14 | Disposition: A | Discharge status: Home / Self Care | Payer: Medicare Other | Attending: Emergency Medicine | Admitting: Emergency Medicine

## 2021-07-13 DIAGNOSIS — I5022 Chronic systolic (congestive) heart failure: Secondary | ICD-10-CM | POA: Diagnosis not present

## 2021-07-13 DIAGNOSIS — Z20822 Contact with and (suspected) exposure to covid-19: Secondary | ICD-10-CM | POA: Insufficient documentation

## 2021-07-13 DIAGNOSIS — Z7982 Long term (current) use of aspirin: Secondary | ICD-10-CM | POA: Diagnosis not present

## 2021-07-13 DIAGNOSIS — Z452 Encounter for adjustment and management of vascular access device: Secondary | ICD-10-CM

## 2021-07-13 DIAGNOSIS — R531 Weakness: Secondary | ICD-10-CM

## 2021-07-13 DIAGNOSIS — J45909 Unspecified asthma, uncomplicated: Secondary | ICD-10-CM | POA: Diagnosis not present

## 2021-07-13 DIAGNOSIS — I110 Hypertensive heart disease with heart failure: Secondary | ICD-10-CM | POA: Diagnosis not present

## 2021-07-13 DIAGNOSIS — Z79899 Other long term (current) drug therapy: Secondary | ICD-10-CM | POA: Insufficient documentation

## 2021-07-13 DIAGNOSIS — N179 Acute kidney failure, unspecified: Secondary | ICD-10-CM | POA: Insufficient documentation

## 2021-07-13 DIAGNOSIS — T82534A Leakage of infusion catheter, initial encounter: Secondary | ICD-10-CM | POA: Diagnosis present

## 2021-07-13 LAB — CBG MONITORING, ED: Glucose-Capillary: 233 mg/dL — ABNORMAL HIGH (ref 70–99)

## 2021-07-13 LAB — CBC WITH DIFFERENTIAL/PLATELET

## 2021-07-13 LAB — URINALYSIS, ROUTINE W REFLEX MICROSCOPIC
Bilirubin Urine: NEGATIVE
Glucose, UA: NEGATIVE mg/dL
Hgb urine dipstick: NEGATIVE
Ketones, ur: NEGATIVE mg/dL
Leukocytes,Ua: NEGATIVE
Nitrite: NEGATIVE
Protein, ur: NEGATIVE mg/dL
Specific Gravity, Urine: 1.017 (ref 1.005–1.030)
pH: 6 (ref 5.0–8.0)

## 2021-07-13 NOTE — ED Provider Triage Note (Signed)
Emergency Medicine Provider Triage Evaluation Note ? ?Victor [name] , a 61 y.o. male  was evaluated in triage.  Pt complains of weakness/dizziness.  S/p heart transplant January 2023 at [facility]. 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  States some issues with PICC line started this morning-- some pain and was leaking this evening while trying to do infusion.  States some mild weakness.  Denies fever/chills/sweats.  PICC was placed last Friday so fairly new.  He has been complaint with all medications. ? ?Review of Systems  ?Positive: Weakness, PICC problem ?Negative: Fever, chills ? ?Physical Exam  ?BP (!) 142/96 (BP Location: Left Arm)   Pulse 91   Temp 98.5 ?F (36.9 ?C) (Oral)   Resp 20   SpO2 97%  ?Gen:   Awake, no distress   ?Resp:  Normal effort ?MSK:   Moves extremities without difficulty  ?Other:  PICC line RUE, cap is missing but no overlying erythema or induration surrounding site, dressing in place ? ?Medical Decision Making  ?Medically screening exam initiated at 11:02 PM.  Appropriate orders placed.  Hassel Uphoff was informed that the remainder of the evaluation will be completed by another provider, this initial triage assessment does not replace that evaluation, and the importance of remaining in the ED until their evaluation is complete. ? ?Generalized weakness, PICC line problem.  Status post heart transplant in January 2023 at [facility].  Currently on ganciclovir back PICC infusion for CMV.  Some issues with PICC line beginning this morning.  Wife states he had appeared weak all day.  No specific symptoms, specifically no fever or chills.  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 ? ?Made acuity 2.  Charge RN aware, will prioritize room assignment. ?  ?Larene Pickett, PA-C ?07/13/21 2316 ? ?

## 2021-07-13 NOTE — ED Triage Notes (Signed)
Pt came in with c/o weakness. Pt's wife states that he has looked weak all day. Pt himself states that he is here because his PICC line is leaking. Heart transplant Jan 1 at [facility] ?

## 2021-07-14 ENCOUNTER — Emergency Department (HOSPITAL_COMMUNITY): Payer: Medicare Other

## 2021-07-14 DIAGNOSIS — T82534A Leakage of infusion catheter, initial encounter: Secondary | ICD-10-CM | POA: Diagnosis not present

## 2021-07-14 LAB — COMPREHENSIVE METABOLIC PANEL
ALT: 19 U/L (ref 0–44)
AST: 20 U/L (ref 15–41)
Albumin: 4 g/dL (ref 3.5–5.0)
Alkaline Phosphatase: 64 U/L (ref 38–126)
Anion gap: 10 (ref 5–15)
BUN: 26 mg/dL — ABNORMAL HIGH (ref 6–20)
CO2: 22 mmol/L (ref 22–32)
Calcium: 9.2 mg/dL (ref 8.9–10.3)
Chloride: 109 mmol/L (ref 98–111)
Creatinine, Ser: 1.78 mg/dL — ABNORMAL HIGH (ref 0.61–1.24)
GFR, Estimated: 43 mL/min — ABNORMAL LOW (ref >60)
Glucose, Bld: 237 mg/dL — ABNORMAL HIGH (ref 70–99)
Potassium: 5.7 mmol/L — ABNORMAL HIGH (ref 3.5–5.1)
Sodium: 141 mmol/L (ref 135–145)
Total Bilirubin: 1.5 mg/dL — ABNORMAL HIGH (ref 0.3–1.2)
Total Protein: 6.9 g/dL (ref 6.5–8.1)

## 2021-07-14 LAB — CBC WITH DIFFERENTIAL/PLATELET
Abs Immature Granulocytes: 0 10*3/uL (ref 0.00–0.07)
Basophils Absolute: 0 10*3/uL (ref 0.0–0.1)
Basophils Relative: 3 %
Eosinophils Absolute: 0 10*3/uL (ref 0.0–0.5)
Eosinophils Relative: 3 %
HCT: 43.5 % (ref 39.0–52.0)
Hemoglobin: 13.4 g/dL (ref 13.0–17.0)
Lymphocytes Relative: 29 %
Lymphs Abs: 0.4 10*3/uL — ABNORMAL LOW (ref 0.7–4.0)
MCH: 30 pg (ref 26.0–34.0)
MCHC: 30.8 g/dL (ref 30.0–36.0)
MCV: 97.5 fL (ref 80.0–100.0)
Monocytes Absolute: 0 10*3/uL — ABNORMAL LOW (ref 0.1–1.0)
Monocytes Relative: 0 %
Neutro Abs: 0.8 10*3/uL — ABNORMAL LOW (ref 1.7–7.7)
Neutrophils Relative %: 65 %
Platelets: 198 10*3/uL (ref 150–400)
RBC: 4.46 MIL/uL (ref 4.22–5.81)
RDW: 14.4 % (ref 11.5–15.5)
WBC: 1.3 10*3/uL — CL (ref 4.0–10.5)
nRBC: 0 % (ref 0.0–0.2)

## 2021-07-14 LAB — RESP PANEL BY RT-PCR (FLU A&B, COVID) ARPGX2
Influenza A by PCR: NEGATIVE
Influenza B by PCR: NEGATIVE
SARS Coronavirus 2 by RT PCR: NEGATIVE

## 2021-07-14 LAB — TROPONIN I (HIGH SENSITIVITY)
Troponin I (High Sensitivity): 11 ng/L (ref <18)
Troponin I (High Sensitivity): 12 ng/L (ref <18)

## 2021-07-14 LAB — BRAIN NATRIURETIC PEPTIDE: B Natriuretic Peptide: 353.1 pg/mL — ABNORMAL HIGH (ref 0.0–100.0)

## 2021-07-14 LAB — LACTIC ACID, PLASMA: Lactic Acid, Venous: 1.8 mmol/L (ref 0.5–1.9)

## 2021-07-14 MED ORDER — SODIUM CHLORIDE 0.9% FLUSH: 10.0000 mL | INTRAVENOUS | Status: DC | PRN

## 2021-07-14 MED ORDER — SODIUM ZIRCONIUM CYCLOSILICATE 10 G PO PACK
10.0000 g | PACK | Freq: Once | ORAL | Status: AC
  Administered 2021-07-14: 10 g via ORAL
  Filled 2021-07-14: qty 1

## 2021-07-14 MED ORDER — SODIUM CHLORIDE 0.9% FLUSH: 10.0000 mL | Freq: Two times a day (BID) | INTRAVENOUS | Status: DC

## 2021-07-14 MED ORDER — SODIUM CHLORIDE 0.9 % IV SOLN: 610.0000 mg | Freq: Once | INTRAVENOUS | Status: DC

## 2021-07-14 MED ORDER — FUROSEMIDE 10 MG/ML IJ SOLN
40.0000 mg | Freq: Once | INTRAMUSCULAR | Status: AC
  Administered 2021-07-14: 40 mg via INTRAVENOUS
  Filled 2021-07-14: qty 4

## 2021-07-14 MED ORDER — CHLORHEXIDINE GLUCONATE CLOTH 2 % EX PADS: 6.0000 | MEDICATED_PAD | Freq: Every day | CUTANEOUS | Status: DC

## 2021-07-14 MED ORDER — GANCICLOVIR SODIUM 500 MG IV SOLR
610.0000 mg | Freq: Once | INTRAVENOUS | Status: AC
  Administered 2021-07-14: 610 mg via INTRAVENOUS
  Filled 2021-07-14 (×2): qty 12.2

## 2021-07-14 MED ORDER — ALTEPLASE 2 MG IJ SOLR
2.0000 mg | Freq: Once | INTRAMUSCULAR | Status: AC
  Administered 2021-07-14: 2 mg
  Filled 2021-07-14: qty 2
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
## 2021-07-14 NOTE — Progress Notes (Addendum)
Pt with RUE single lumen PICC placed at outside facility on 07/07/21 per his report. Blood noted in PICC and extension tubing. Extension tubing removed. New cap applied. PICC occluded. Standing order entered for alteplase. ? ?9795: Brisk blood return from PICC after extended tPA dwell time. Noted PICC dressing change due--done. Site unremarkable. Antimicrobial skin adhesive applied at exit site. This may have a crust-like appearance when dressing is removed. Pt made aware.  ?Push-stop PICC flushing technique reviewed with pt.  ? ?Ganciclovir infusion initiated. Disposal bucket in the room. Report given to [facility], Therapist, sports. ?

## 2021-07-15 LAB — PATHOLOGIST SMEAR REVIEW

## 2021-07-19 LAB — CULTURE, BLOOD (ROUTINE X 2)
Culture: NO GROWTH
Culture: NO GROWTH
Special Requests: ADEQUATE

## 2021-07-20 ENCOUNTER — Other Ambulatory Visit: Payer: Self-pay

## 2021-07-20 ENCOUNTER — Encounter: Payer: Medicare Other | Attending: Physical Medicine and Rehabilitation | Admitting: Registered Nurse

## 2021-07-20 ENCOUNTER — Encounter: Payer: Self-pay | Admitting: Registered Nurse

## 2021-07-20 VITALS — BP 122/86 | HR 93 | Ht 72.0 in | Wt 266.4 lb

## 2021-07-20 DIAGNOSIS — Z79891 Long term (current) use of opiate analgesic: Secondary | ICD-10-CM | POA: Diagnosis not present

## 2021-07-20 DIAGNOSIS — F418 Other specified anxiety disorders: Secondary | ICD-10-CM | POA: Insufficient documentation

## 2021-07-20 DIAGNOSIS — M25562 Pain in left knee: Secondary | ICD-10-CM | POA: Diagnosis present

## 2021-07-20 DIAGNOSIS — G8929 Other chronic pain: Secondary | ICD-10-CM | POA: Insufficient documentation

## 2021-07-20 DIAGNOSIS — Z5181 Encounter for therapeutic drug level monitoring: Secondary | ICD-10-CM | POA: Diagnosis not present

## 2021-07-20 DIAGNOSIS — M1732 Unilateral post-traumatic osteoarthritis, left knee: Secondary | ICD-10-CM | POA: Diagnosis present

## 2021-07-20 DIAGNOSIS — S8412XS Injury of peroneal nerve at lower leg level, left leg, sequela: Secondary | ICD-10-CM | POA: Diagnosis present

## 2021-07-20 DIAGNOSIS — G894 Chronic pain syndrome: Secondary | ICD-10-CM | POA: Diagnosis present

## 2021-07-20 MED ORDER — OXYCODONE HCL 15 MG PO TABS: 15.0000 mg | ORAL_TABLET | Freq: Three times a day (TID) | ORAL | 0 refills | Status: DC
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
## 2021-07-26 ENCOUNTER — Telehealth: Payer: Self-pay | Admitting: *Deleted

## 2021-07-26 LAB — TOXASSURE SELECT,+ANTIDEPR,UR

## 2021-07-26 NOTE — Telephone Encounter (Signed)
Urine drug screen for this encounter is consistent for prescribed medication. However it is also positive for alcohol.  ?

## 2021-07-31 NOTE — Telephone Encounter (Signed)
A formal warning letter has been sent via [place] and [place]. ?

## 2021-08-22 ENCOUNTER — Encounter: Payer: Medicare Other | Attending: Physical Medicine and Rehabilitation | Admitting: Registered Nurse

## 2021-08-22 ENCOUNTER — Encounter: Payer: Self-pay | Admitting: Registered Nurse

## 2021-08-22 VITALS — BP 123/85 | HR 86 | Ht 72.0 in | Wt 261.0 lb

## 2021-08-22 DIAGNOSIS — G894 Chronic pain syndrome: Secondary | ICD-10-CM

## 2021-08-22 DIAGNOSIS — G8929 Other chronic pain: Secondary | ICD-10-CM

## 2021-08-22 DIAGNOSIS — S8412XS Injury of peroneal nerve at lower leg level, left leg, sequela: Secondary | ICD-10-CM | POA: Diagnosis present

## 2021-08-22 DIAGNOSIS — M25572 Pain in left ankle and joints of left foot: Secondary | ICD-10-CM | POA: Diagnosis present

## 2021-08-22 DIAGNOSIS — M1732 Unilateral post-traumatic osteoarthritis, left knee: Secondary | ICD-10-CM | POA: Diagnosis present

## 2021-08-22 DIAGNOSIS — Z5181 Encounter for therapeutic drug level monitoring: Secondary | ICD-10-CM | POA: Diagnosis present

## 2021-08-22 DIAGNOSIS — M25562 Pain in left knee: Secondary | ICD-10-CM

## 2021-08-22 DIAGNOSIS — F418 Other specified anxiety disorders: Secondary | ICD-10-CM | POA: Diagnosis present

## 2021-08-22 MED ORDER — ALPRAZOLAM 0.5 MG PO TABS: 0.5000 mg | ORAL_TABLET | Freq: Two times a day (BID) | ORAL | 1 refills | Status: DC | PRN

## 2021-08-22 MED ORDER — OXYCODONE HCL 15 MG PO TABS: 15.0000 mg | ORAL_TABLET | Freq: Three times a day (TID) | ORAL | 0 refills | Status: DC
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
## 2021-09-07 ENCOUNTER — Other Ambulatory Visit: Payer: Self-pay

## 2021-09-07 ENCOUNTER — Encounter (HOSPITAL_COMMUNITY): Payer: Self-pay | Admitting: Emergency Medicine

## 2021-09-07 ENCOUNTER — Emergency Department (HOSPITAL_COMMUNITY)
Admission: EM | Admit: 2021-09-07 | Discharge: 2021-09-08 | Disposition: A | Discharge status: Other Acute Inpatient Hospital | Payer: Medicare Other | Attending: Emergency Medicine | Admitting: Emergency Medicine

## 2021-09-07 ENCOUNTER — Emergency Department (HOSPITAL_COMMUNITY): Payer: Medicare Other

## 2021-09-07 DIAGNOSIS — Z794 Long term (current) use of insulin: Secondary | ICD-10-CM | POA: Diagnosis not present

## 2021-09-07 DIAGNOSIS — Z7982 Long term (current) use of aspirin: Secondary | ICD-10-CM | POA: Diagnosis not present

## 2021-09-07 DIAGNOSIS — R251 Tremor, unspecified: Secondary | ICD-10-CM | POA: Insufficient documentation

## 2021-09-07 DIAGNOSIS — N179 Acute kidney failure, unspecified: Secondary | ICD-10-CM | POA: Insufficient documentation

## 2021-09-07 DIAGNOSIS — E860 Dehydration: Secondary | ICD-10-CM

## 2021-09-07 DIAGNOSIS — Z941 Heart transplant status: Secondary | ICD-10-CM | POA: Insufficient documentation

## 2021-09-07 DIAGNOSIS — R5383 Other fatigue: Secondary | ICD-10-CM | POA: Diagnosis present

## 2021-09-07 DIAGNOSIS — Z20822 Contact with and (suspected) exposure to covid-19: Secondary | ICD-10-CM | POA: Diagnosis not present

## 2021-09-07 DIAGNOSIS — R531 Weakness: Secondary | ICD-10-CM | POA: Diagnosis not present

## 2021-09-07 DIAGNOSIS — R630 Anorexia: Secondary | ICD-10-CM | POA: Diagnosis not present

## 2021-09-07 LAB — COMPREHENSIVE METABOLIC PANEL
ALT: 17 U/L (ref 0–44)
AST: 21 U/L (ref 15–41)
Albumin: 3.7 g/dL (ref 3.5–5.0)
Alkaline Phosphatase: 70 U/L (ref 38–126)
Anion gap: 11 (ref 5–15)
BUN: 34 mg/dL — ABNORMAL HIGH (ref 6–20)
CO2: 18 mmol/L — ABNORMAL LOW (ref 22–32)
Calcium: 9.2 mg/dL (ref 8.9–10.3)
Chloride: 107 mmol/L (ref 98–111)
Creatinine, Ser: 3.88 mg/dL — ABNORMAL HIGH (ref 0.61–1.24)
GFR, Estimated: 17 mL/min — ABNORMAL LOW (ref >60)
Glucose, Bld: 100 mg/dL — ABNORMAL HIGH (ref 70–99)
Potassium: 5.2 mmol/L — ABNORMAL HIGH (ref 3.5–5.1)
Sodium: 136 mmol/L (ref 135–145)
Total Bilirubin: 3.1 mg/dL — ABNORMAL HIGH (ref 0.3–1.2)
Total Protein: 6.8 g/dL (ref 6.5–8.1)

## 2021-09-07 LAB — CBC WITH DIFFERENTIAL/PLATELET
Abs Immature Granulocytes: 0.09 10*3/uL — ABNORMAL HIGH (ref 0.00–0.07)
Basophils Absolute: 0 10*3/uL (ref 0.0–0.1)
Basophils Relative: 1 %
Eosinophils Absolute: 0 10*3/uL (ref 0.0–0.5)
Eosinophils Relative: 0 %
HCT: 39.9 % (ref 39.0–52.0)
Hemoglobin: 13.6 g/dL (ref 13.0–17.0)
Immature Granulocytes: 6 %
Lymphocytes Relative: 24 %
Lymphs Abs: 0.3 10*3/uL — ABNORMAL LOW (ref 0.7–4.0)
MCH: 32.2 pg (ref 26.0–34.0)
MCHC: 34.1 g/dL (ref 30.0–36.0)
MCV: 94.3 fL (ref 80.0–100.0)
Monocytes Absolute: 0.2 10*3/uL (ref 0.1–1.0)
Monocytes Relative: 15 %
Neutro Abs: 0.8 10*3/uL — ABNORMAL LOW (ref 1.7–7.7)
Neutrophils Relative %: 54 %
Platelets: 210 10*3/uL (ref 150–400)
RBC: 4.23 MIL/uL (ref 4.22–5.81)
RDW: 17.2 % — ABNORMAL HIGH (ref 11.5–15.5)
WBC: 1.4 10*3/uL — CL (ref 4.0–10.5)
nRBC: 0 % (ref 0.0–0.2)

## 2021-09-07 LAB — URINALYSIS, ROUTINE W REFLEX MICROSCOPIC
Bilirubin Urine: NEGATIVE
Glucose, UA: NEGATIVE mg/dL
Hgb urine dipstick: NEGATIVE
Ketones, ur: NEGATIVE mg/dL
Leukocytes,Ua: NEGATIVE
Nitrite: NEGATIVE
Protein, ur: 100 mg/dL — AB
Specific Gravity, Urine: 1.02 (ref 1.005–1.030)
pH: 5 (ref 5.0–8.0)

## 2021-09-07 LAB — RESP PANEL BY RT-PCR (FLU A&B, COVID) ARPGX2
Influenza A by PCR: NEGATIVE
Influenza B by PCR: NEGATIVE
SARS Coronavirus 2 by RT PCR: NEGATIVE

## 2021-09-07 LAB — TROPONIN I (HIGH SENSITIVITY)
Troponin I (High Sensitivity): 14 ng/L (ref <18)
Troponin I (High Sensitivity): 15 ng/L (ref <18)

## 2021-09-07 LAB — LACTIC ACID, PLASMA: Lactic Acid, Venous: 1.3 mmol/L (ref 0.5–1.9)

## 2021-09-07 LAB — PROTIME-INR
INR: 1.2 (ref 0.8–1.2)
Prothrombin Time: 14.6 seconds (ref 11.4–15.2)

## 2021-09-07 LAB — APTT: aPTT: 28 seconds (ref 24–36)

## 2021-09-07 MED ORDER — LACTATED RINGERS IV SOLN: INTRAVENOUS | Status: DC

## 2021-09-07 MED ORDER — LACTATED RINGERS IV BOLUS: 500.0000 mL | Freq: Once | INTRAVENOUS | Status: DC

## 2021-09-07 NOTE — ED Triage Notes (Signed)
Patient complains of loss of appetite and generalized weakness that started several days ago but have gotten worse over the last two days. Patient had a heart transplant in January 2023, has appointment Wednesday to see his transplant team. Patient denies pain, is alert, oriented, and in no apparent distress at this time. ?

## 2021-09-07 NOTE — ED Provider Triage Note (Signed)
Emergency Medicine Provider Triage Evaluation Note ? ?Victor [name] , a 61 y.o. male  was evaluated in triage.  Pt complains of weakness and tremulousness.  He is status post heart transplant at [facility] in January of this year.  Patient is on tacrolimus.  He discontinued CellCept about a month ago.  He is also on prednisone and had his dose decreased from 10 to 7.5, 1-week ago.  Patient.  Dates that he has had no appetite, poor oral intake for the past 2 weeks, now feeling extremely weak and shaky.  He feels like he may have fever and rigors.  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  Patient came in for further evaluation.  He did contact his transplant team who referred him here. ? ?Review of Systems  ?Positive: Weakness ?Negative: Vomiting ? ?Physical Exam  ?BP 122/86 (BP Location: Left Arm)   Pulse 95   Temp 99.5 ?F (37.5 ?C) (Oral)   Resp 18   SpO2 96%  ?Gen:   Awake, no distress   ?Resp:  Normal effort  ?MSK:   Moves extremities without difficulty  ?Other:  RRR ? ?Medical Decision Making  ?Medically screening exam initiated at 6:44 PM.  Appropriate orders placed.  Victor [name] was informed that the remainder of the evaluation will be completed by another provider, this initial triage assessment does not replace that evaluation, and the importance of remaining in the ED until their evaluation is complete. ? ?Work up initiated ?  ?Margarita Mail, PA-C ?09/07/21 1846 ? ?

## 2021-09-07 NOTE — ED Provider Notes (Signed)
?[facility] ?Provider Note ? ? ?CSN: [id number] ?Arrival date & time: 09/07/21  1727 ? ?  ? ?History ? ?Chief Complaint  ?Patient presents with  ? Weakness  ? ? ?Victor [name] is a 61 y.o. male. ? ?HPI ?Pt complains of weakness and tremulousness.  He is status post heart transplant at [facility] in January of this year.  Patient is on tacrolimus.  He discontinued CellCept about a month ago.  He is also on prednisone and had his dose decreased from 10 to 7.5, 1-week ago.  Patient.  States that he has had no appetite, poor oral intake for the past 2 weeks, now feeling extremely weak and shaky.  He feels like he may have fever and rigors.  He has measured his fever once and did not have a fever.  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  Patient came in for further evaluation.  He did contact his transplant team who referred him here. ?  ? ?Home Medications ?Prior to Admission medications   ?Medication Sig Start Date End Date Taking? Authorizing Provider  ?albuterol (VENTOLIN HFA) 108 (90 Base) MCG/ACT inhaler Inhale 2 puffs into the lungs every 6 (six) hours as needed for wheezing or shortness of breath.   Yes [provider]  ?ALPRAZolam (XANAX) 0.5 MG tablet Take 1 tablet (0.5 mg total) by mouth 2 (two) times daily as needed for anxiety. 08/22/21  Yes Bayard Hugger, NP  ?amLODipine (NORVASC) 5 MG tablet Take 5 mg by mouth daily. 07/04/21  Yes [provider]  ?aspirin 81 MG EC tablet Take 1 tablet by mouth daily. 05/11/21 05/11/22 Yes [provider]  ?atovaquone (MEPRON) 750 MG/5ML suspension Take 1,500 mg by mouth every morning. 08/15/21  Yes [provider]  ?Calcium Carb-Cholecalciferol 600-10 MG-MCG TABS Take 10 mg by mouth daily. 05/11/21 05/11/22 Yes [provider]  ?furosemide (LASIX) 20 MG tablet Take 2 tablets by mouth 2 (two) times daily.  05/23/21 05/23/22 Yes [provider]  ?HUMULIN 70/30 KWIKPEN (70-30) 100 UNIT/ML KwikPen Inject into the skin See admin instructions. 60 units in the morning, 30 units at night 08/05/21  Yes [provider]  ?magnesium oxide (MAG-OX) 400 (240 Mg) MG tablet Take 1 tablet (400 mg total) by mouth daily. 04/30/21  Yes Almyra Deforest, [place]  ?MAVYRET 100-40 MG TABS Take 3 tablets by mouth daily. 08/03/21  Yes [provider]  ?metFORMIN (GLUCOPHAGE) 500 MG tablet Take 1 tablet by mouth 2 (two) times daily with a meal. Breakfast and dinner 05/24/21  Yes [provider]  ?montelukast (SINGULAIR) 10 MG tablet Take 10 mg by mouth at bedtime. 08/28/21  Yes [provider]  ?nystatin (MYCOSTATIN) 100000 UNIT/ML suspension Take 5 mLs by mouth 4 (four) times daily. 05/12/21  Yes [provider]  ?oxyCODONE (ROXICODONE) 15 MG immediate release tablet Take 1 tablet (15 mg total) by mouth 3 (three) times daily. 08/22/21  Yes Bayard Hugger, NP  ?pantoprazole (PROTONIX) 40 MG tablet Take 40 mg by mouth daily. 05/12/21  Yes [provider]  ?pravastatin (PRAVACHOL) 40 MG tablet Take 40 mg by mouth daily. 05/12/21  Yes [provider]  ?predniSONE (DELTASONE) 5 MG tablet Take 7.5 mg by mouth daily. 05/12/21  Yes [provider]  ?pregabalin (LYRICA) 150 MG capsule TAKE 1 CAPSULE BY MOUTH THREE TIMES A DAY ?Patient taking  differently: Take 150 mg by mouth in the morning, at noon, and at bedtime. TAKE 1 CAPSULE BY MOUTH THREE TIMES A DAY 06/19/21  Yes Bayard Hugger, NP  ?senna-docusate (SENOKOT-S) 8.6-50 MG tablet Take 1 tablet by mouth at bedtime as needed for mild constipation. 05/11/21  Yes [provider]  ?tacrolimus (PROGRAF) 1 MG capsule Take 14 mg by mouth See admin instructions. 7 capsules in the morning, 7 capsules at bedtime 05/24/21  Yes [provider]  ?valGANciclovir (VALCYTE) 450 MG tablet Take 900 mg by mouth daily. 09/02/21  Yes [provider]  ?VEMLIDY 25 MG TABS Take 25 mg by mouth daily. 09/05/21  Yes [provider]  ?Delfin Edis 250-50 MCG/ACT AEPB Inhale 1 puff into the lungs 2 (two) times daily. 05/19/21  Yes [provider]  ?brinzolamide (AZOPT) 1 % ophthalmic suspension SMARTSIG:In Eye(s) ?Patient not taking: Reported on 09/07/2021 05/11/21   [provider]  ?mycophenolate (CELLCEPT) 500 MG tablet Take 3 tablets by mouth every 12 (twelve) hours. ?Patient not taking: Reported on 09/07/2021 05/16/21 05/16/22  [provider]  ?sulfamethoxazole-trimethoprim (BACTRIM) 400-80 MG tablet Take 1 tablet by mouth daily. ?Patient not taking: Reported on 09/07/2021 05/12/21   [provider]  ?traZODone (DESYREL) 50 MG tablet Take 25 mg by mouth at bedtime as needed. ?Patient not taking: Reported on 08/22/2021 05/12/21   [provider]  ?valACYclovir (VALTREX) 500 MG tablet Take 500 mg by mouth 2 (two) times daily. ?Patient not taking: Reported on 08/22/2021 05/12/21   [provider]  ?   ? ?Allergies    ?Patient has no known allergies.   ? ?Review of Systems   ?Review of Systems ?10 systems reviewed and negative except as per HPI ?Physical Exam ?Updated Vital Signs ?BP 115/88   Pulse 81   Temp 98.4 ?F (36.9 ?C) (Oral)   Resp 17   SpO2 97%  ?Physical Exam ?Constitutional:   ?   Comments: Alert and nontoxic.  Mental status clear.  No respiratory distress at rest.  ?HENT:  ?   Mouth/Throat:  ?   Pharynx: Oropharynx is clear.  ?Eyes:  ?   Extraocular Movements: Extraocular movements intact.  ?Cardiovascular:  ?   Rate and Rhythm: Normal rate and regular rhythm.  ?Pulmonary:  ?   Effort: Pulmonary effort is normal.  ?   Breath sounds: Normal breath sounds.  ?Abdominal:  ?   General: There is no distension.  ?   Palpations: Abdomen is soft.  ?   Tenderness: There is no abdominal tenderness. There is no guarding.  ?Musculoskeletal:     ?   General: No swelling. Normal range of motion.  ?   Right  lower leg: No edema.  ?   Left lower leg: No edema.  ?Skin: ?   General: Skin is warm and dry.  ?Neurological:  ?   General: No focal deficit present.  ?   Mental Status: He is oriented to person, place, and time.  ?   Coordination: Coordination normal.  ?Psychiatric:     ?   Mood and Affect: Mood normal.  ? ? ?ED Results / Procedures / Treatments   ?Labs ?(all labs ordered are listed, but only abnormal results are displayed) ?Labs Reviewed  ?COMPREHENSIVE METABOLIC PANEL - Abnormal; Notable for the following components:  ?    Result Value  ? Potassium 5.2 (*)   ? CO2 18 (*)   ? Glucose, Bld 100 (*)   ?  BUN 34 (*)   ? Creatinine, Ser 3.88 (*)   ? Total Bilirubin 3.1 (*)   ? GFR, Estimated 17 (*)   ? All other components within normal limits  ?CBC WITH DIFFERENTIAL/PLATELET - Abnormal; Notable for the following components:  ? WBC 1.4 (*)   ? RDW 17.2 (*)   ? Neutro Abs 0.8 (*)   ? Lymphs Abs 0.3 (*)   ? Abs Immature Granulocytes 0.09 (*)   ? All other components within normal limits  ?URINALYSIS, ROUTINE W REFLEX MICROSCOPIC - Abnormal; Notable for the following components:  ? Color, Urine AMBER (*)   ? APPearance CLOUDY (*)   ? Protein, ur 100 (*)   ? Bacteria, UA RARE (*)   ? All other components within normal limits  ?RESP PANEL BY RT-PCR (FLU A&B, COVID) [id number]  ?CULTURE, BLOOD (ROUTINE X 2)  ?CULTURE, BLOOD (ROUTINE X 2)  ?URINE CULTURE  ?LACTIC ACID, PLASMA  ?PROTIME-INR  ?APTT  ?LACTIC ACID, PLASMA  ?PATHOLOGIST SMEAR REVIEW  ?TROPONIN I (HIGH SENSITIVITY)  ?TROPONIN I (HIGH SENSITIVITY)  ? ? ?EKG ?EKG Interpretation ? ?Date/Time:  Thursday Sep 07 2021 18:34:09 EDT ?Ventricular Rate:  94 ?PR Interval:  156 ?QRS Duration: 122 ?QT Interval:  378 ?QTC Calculation: 472 ?R Axis:   239 ?Text Interpretation: Normal sinus rhythm Right superior axis deviation RSR' or QR pattern in V1 suggests right ventricular conduction delay Abnormal ECG When compared with ECG of 13-Jul-2021 22:42, PREVIOUS ECG IS PRESENT increased  lateral voltage compared to previouis, otherwise similar Confirmed by Charlesetta Shanks [phone]) on 09/07/2021 10:40:58 PM ? ?Radiology ?DG Chest 2 View ? ?Result Date: 09/07/2021 ?CLINICAL DATA:  Shortness of breath and

## 2021-09-08 DIAGNOSIS — E860 Dehydration: Secondary | ICD-10-CM | POA: Diagnosis not present

## 2021-09-08 DIAGNOSIS — R531 Weakness: Secondary | ICD-10-CM | POA: Diagnosis not present

## 2021-09-08 DIAGNOSIS — R251 Tremor, unspecified: Secondary | ICD-10-CM | POA: Diagnosis not present

## 2021-09-08 DIAGNOSIS — Z941 Heart transplant status: Secondary | ICD-10-CM | POA: Diagnosis not present

## 2021-09-08 DIAGNOSIS — N179 Acute kidney failure, unspecified: Secondary | ICD-10-CM | POA: Diagnosis not present

## 2021-09-08 DIAGNOSIS — R5383 Other fatigue: Secondary | ICD-10-CM | POA: Diagnosis present

## 2021-09-08 DIAGNOSIS — Z20822 Contact with and (suspected) exposure to covid-19: Secondary | ICD-10-CM | POA: Diagnosis not present

## 2021-09-08 DIAGNOSIS — Z7982 Long term (current) use of aspirin: Secondary | ICD-10-CM | POA: Diagnosis not present

## 2021-09-08 DIAGNOSIS — R630 Anorexia: Secondary | ICD-10-CM | POA: Diagnosis not present

## 2021-09-08 DIAGNOSIS — Z794 Long term (current) use of insulin: Secondary | ICD-10-CM | POA: Diagnosis not present

## 2021-09-08 NOTE — ED Notes (Signed)
Report given to [facility] RN at [facility] and also given to [place]. Pt axo x4, VSS upon transport. ?

## 2021-09-09 LAB — URINE CULTURE

## 2021-09-09 LAB — PATHOLOGIST SMEAR REVIEW

## 2021-09-12 LAB — CULTURE, BLOOD (ROUTINE X 2)
Culture: NO GROWTH
Culture: NO GROWTH
Special Requests: ADEQUATE

## 2021-09-28 ENCOUNTER — Ambulatory Visit: Payer: Medicare Other | Admitting: Registered Nurse

## 2021-10-09 ENCOUNTER — Telehealth (HOSPITAL_COMMUNITY): Payer: Self-pay | Admitting: Physician Assistant

## 2021-10-09 ENCOUNTER — Telehealth (HOSPITAL_COMMUNITY): Payer: Self-pay

## 2021-10-09 NOTE — Telephone Encounter (Signed)
Called and spoke with pt in regards to CR. Adv pt we have not recv'ed CR referral from [facility] and provided [facility] fax number. Pt stated he will contact [facility] to have them fax it.

## 2021-10-19 ENCOUNTER — Telehealth (HOSPITAL_COMMUNITY): Payer: Self-pay

## 2021-10-19 NOTE — Telephone Encounter (Signed)
Pt insurance is active and benefits verified through Medicare a/b Co-pay 0, DED $226/$226 met, out of pocket 0/0 met, co-insurance 20%. no pre-authorization required. Passport, 10/16/2021'@9' :57am, REF# [id number]   2ndary insurance is active and benefits verified through [employer]. Co-pay 0, DED 0/0 met, out of pocket 0/0 met, co-insurance 0%. No pre-authorization required. Passport, 10/19/2021'@10' Victor [name], REF# [id number]    How many CR sessions are covered? (36 sessions for TCR, 72 sessions for ICR)72 Is this a lifetime maximum or an annual maximum? 0 Has the member used any of these services to date? no Is there a time limit (weeks/months) on start of program and/or program completion? no     Will contact patient to see if he is interested in the Cardiac Rehab Program. If interested, patient will need to complete follow up appt. Once completed, patient will be contacted for scheduling upon review by the RN Navigator.

## 2021-10-19 NOTE — Telephone Encounter (Signed)
Called patient to see if he is interested in the Cardiac Rehab Program. Patient expressed interest. Explained scheduling process and went over insurance, patient verbalized understanding. Will contact patient for scheduling once f/u has been completed.  °

## 2021-10-25 ENCOUNTER — Ambulatory Visit: Payer: Medicare Other | Admitting: Physical Medicine and Rehabilitation

## 2021-10-27 ENCOUNTER — Encounter: Payer: Medicare Other | Attending: Physical Medicine and Rehabilitation | Admitting: Registered Nurse

## 2021-10-27 ENCOUNTER — Encounter: Payer: Self-pay | Admitting: Registered Nurse

## 2021-10-27 ENCOUNTER — Telehealth: Payer: Self-pay

## 2021-10-27 VITALS — BP 135/97 | HR 85 | Ht 72.0 in | Wt 268.0 lb

## 2021-10-27 DIAGNOSIS — M1732 Unilateral post-traumatic osteoarthritis, left knee: Secondary | ICD-10-CM | POA: Diagnosis not present

## 2021-10-27 DIAGNOSIS — S8412XS Injury of peroneal nerve at lower leg level, left leg, sequela: Secondary | ICD-10-CM

## 2021-10-27 DIAGNOSIS — Z79891 Long term (current) use of opiate analgesic: Secondary | ICD-10-CM | POA: Diagnosis present

## 2021-10-27 DIAGNOSIS — M25562 Pain in left knee: Secondary | ICD-10-CM | POA: Diagnosis not present

## 2021-10-27 DIAGNOSIS — M25572 Pain in left ankle and joints of left foot: Secondary | ICD-10-CM | POA: Diagnosis present

## 2021-10-27 DIAGNOSIS — G894 Chronic pain syndrome: Secondary | ICD-10-CM | POA: Diagnosis present

## 2021-10-27 DIAGNOSIS — Z5181 Encounter for therapeutic drug level monitoring: Secondary | ICD-10-CM | POA: Diagnosis present

## 2021-10-27 DIAGNOSIS — F418 Other specified anxiety disorders: Secondary | ICD-10-CM | POA: Diagnosis not present

## 2021-10-27 DIAGNOSIS — G8929 Other chronic pain: Secondary | ICD-10-CM

## 2021-10-27 MED ORDER — ALPRAZOLAM 0.5 MG PO TABS: 0.5000 mg | ORAL_TABLET | Freq: Two times a day (BID) | ORAL | 1 refills | Status: DC | PRN

## 2021-10-27 MED ORDER — OXYCODONE HCL 15 MG PO TABS: 15.0000 mg | ORAL_TABLET | Freq: Three times a day (TID) | ORAL | 0 refills | Status: DC

## 2021-10-27 NOTE — Telephone Encounter (Signed)
[facility]'t [facility], patient found at [facility] [address]. [facility]elled [facility].

## 2021-10-28 MED ORDER — OXYCODONE HCL 15 MG PO TABS: 15.0000 mg | ORAL_TABLET | Freq: Three times a day (TID) | ORAL | 0 refills | Status: DC

## 2021-11-06 ENCOUNTER — Ambulatory Visit: Payer: Medicare Other | Admitting: Registered Nurse

## 2021-11-13 ENCOUNTER — Other Ambulatory Visit: Payer: Self-pay | Admitting: Registered Nurse

## 2021-11-14 NOTE — Telephone Encounter (Signed)
PMP was Reviewed.  Lyrica e-scribed today.

## 2021-12-07 ENCOUNTER — Other Ambulatory Visit: Payer: Self-pay | Admitting: Physician Assistant

## 2021-12-07 DIAGNOSIS — R42 Dizziness and giddiness: Secondary | ICD-10-CM

## 2021-12-11 ENCOUNTER — Encounter: Payer: Self-pay | Admitting: Hematology and Oncology

## 2021-12-13 ENCOUNTER — Telehealth (HOSPITAL_COMMUNITY): Payer: Self-pay

## 2021-12-14 ENCOUNTER — Encounter (HOSPITAL_COMMUNITY): Payer: Self-pay

## 2021-12-14 ENCOUNTER — Encounter (HOSPITAL_COMMUNITY)
Admission: RE | Admit: 2021-12-14 | Discharge: 2021-12-14 | Disposition: A | Discharge status: Home / Self Care | Payer: Medicare Other | Source: Ambulatory Visit | Attending: Cardiology | Admitting: Cardiology

## 2021-12-14 VITALS — BP 110/80 | HR 77 | Ht 72.25 in | Wt 252.2 lb

## 2021-12-14 DIAGNOSIS — Z48812 Encounter for surgical aftercare following surgery on the circulatory system: Secondary | ICD-10-CM | POA: Diagnosis not present

## 2021-12-14 DIAGNOSIS — Z941 Heart transplant status: Secondary | ICD-10-CM | POA: Diagnosis not present

## 2021-12-14 DIAGNOSIS — R7309 Other abnormal glucose: Secondary | ICD-10-CM | POA: Insufficient documentation

## 2021-12-14 LAB — GLUCOSE, CAPILLARY: Glucose-Capillary: 177 mg/dL — ABNORMAL HIGH (ref 70–99)

## 2021-12-14 NOTE — Progress Notes (Signed)
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 and proper form for all exercise.  Medical   Aging: Enhancing Your Quality of Life  Clinical staff conducted group or individual video education with verbal and written material and guidebook.  Patient learns key strategies and recommendations to stay in good physical health and enhance quality of life, such as prevention strategies, having an advocate, securing a [place], and keeping a list of medications and system for tracking them. It also discusses how to avoid risk for bone loss.  Biology of Weight Control  Clinical staff conducted group or individual video education with verbal and written material and guidebook.  Patient learns that weight gain occurs because we consume more calories than we burn (eating more, moving less). Even if your body weight is normal, you may have  higher ratios of fat compared to muscle mass. Too much body fat puts you at increased risk for cardiovascular disease, heart attack, stroke, type 2 diabetes, and obesity-related cancers. In addition to exercise, following the [place] can help reduce your risk.  Decoding Lab Results  Clinical staff conducted group or individual video education with verbal and written material and guidebook.  Patient learns that lab test reflects one measurement whose values change over time and are influenced by many factors, including medication, stress, sleep, exercise, food, hydration, pre-existing medical conditions, and more. It is recommended to use the knowledge from this video to become more involved with your lab results and evaluate your numbers to speak with your doctor.   Diseases of Our Time - Overview  Clinical staff conducted group or individual video education with verbal and written material and guidebook.  Patient learns that according to the CDC, 50% to 70% of chronic diseases (such as obesity, type 2 diabetes, elevated lipids, hypertension, and heart disease) are avoidable through lifestyle improvements including healthier food choices, listening to satiety cues, and increased physical activity.  Sleep Disorders Clinical staff conducted group or individual video education with verbal and written material and guidebook.  Patient learns how good quality and duration of sleep are important to overall health and well-being. Patient also learns about sleep disorders and how they impact health along with recommendations to address them, including discussing with a physician.  Nutrition  Dining Out - Part 2 Clinical staff conducted group or individual video education with verbal and written material and guidebook.  Patient learns how to plan ahead and communicate in order to maximize their dining experience in a healthy and nutritious manner. Included are recommended food choices based on  the type of restaurant the patient is visiting.   Fueling a Best boy conducted group or individual video education with verbal and written material and guidebook.  There is a strong connection between our food choices and our health. Diseases like obesity and type 2 diabetes are very prevalent and are in large-part due to lifestyle choices. The Pritikin Eating Plan provides plenty of food and hunger-curbing satisfaction. It is easy to follow, affordable, and helps reduce health risks.  Menu Workshop  Clinical staff conducted group or individual video education with verbal and written material and guidebook.  Patient learns that restaurant meals can sabotage health goals because they are often packed with calories, fat, sodium, and sugar. Recommendations include strategies to plan ahead and to communicate with the manager, chef, or server to help order a healthier meal.  Planning Your Eating Strategy  Clinical staff conducted group or individual video education with verbal and written material and guidebook.  Patient learns about the [place] and its benefit of reducing the risk of disease. The [place] does not focus on calories. Instead, it emphasizes high-quality, nutrient-rich foods. By knowing the characteristics of the foods, we choose, we can determine their calorie density and make informed decisions.  Targeting Your Nutrition Priorities  Clinical staff conducted group or individual video education with verbal and written material and guidebook.  Patient learns that lifestyle habits have a tremendous impact on disease risk and progression. This video provides eating and physical activity recommendations based on your personal health goals, such as reducing LDL cholesterol, losing weight, preventing or controlling type 2 diabetes, and reducing high blood pressure.  Vitamins and Minerals  Clinical staff conducted group or individual video education  with verbal and written material and guidebook.  Patient learns different ways to obtain key vitamins and minerals, including through a recommended healthy diet. It is important to discuss all supplements you take with your doctor.   Healthy Mind-Set    Smoking Cessation  Clinical staff conducted group or individual video education with verbal and written material and guidebook.  Patient learns that cigarette smoking and tobacco addiction pose a serious health risk which affects millions of people. Stopping smoking will significantly reduce the risk of heart disease, lung disease, and many forms of cancer. Recommended strategies for quitting are covered, including working with your doctor to develop a successful plan.  Culinary   Becoming a Financial trader conducted group or individual video education with verbal and written material and guidebook.  Patient learns that cooking at home can be healthy, cost-effective, quick, and puts them in control. Keys to cooking healthy recipes will include looking at your recipe, assessing your equipment needs, planning ahead, making it simple, choosing cost-effective seasonal ingredients, and limiting the use of added fats, salts, and sugars.  Cooking - Breakfast and Snacks  Clinical staff conducted group or individual video education with verbal and written material and guidebook.  Patient learns how important breakfast is to satiety and nutrition through the entire day. Recommendations include key foods to eat during breakfast to help stabilize blood sugar levels and to prevent overeating at meals later in the day. Planning ahead is also a key component.  Cooking - Human resources officer conducted group or individual video education with verbal and written material and guidebook.  Patient learns eating strategies to improve overall health, including an approach to cook more at home. Recommendations include thinking of animal protein  as a side on your plate rather than center stage and focusing instead on lower calorie dense options like vegetables, fruits, whole grains, and plant-based proteins, such as beans. Making sauces  in large quantities to freeze for later and leaving the skin on your vegetables are also recommended to maximize your experience.  Cooking - Healthy Salads and Dressing Clinical staff conducted group or individual video education with verbal and written material and guidebook.  Patient learns that vegetables, fruits, whole grains, and legumes are the foundations of the [place]. Recommendations include how to incorporate each of these in flavorful and healthy salads, and how to create homemade salad dressings. Proper handling of ingredients is also covered. Cooking - Soups and [employer] - Soups and Desserts Clinical staff conducted group or individual video education with verbal and written material and guidebook.  Patient learns that Pritikin soups and desserts make for easy, nutritious, and delicious snacks and meal components that are low in sodium, fat, sugar, and calorie density, while high in vitamins, minerals, and filling fiber. Recommendations include simple and healthy ideas for soups and desserts.   Overview     The Pritikin Solution Program Overview Clinical staff conducted group or individual video education with verbal and written material and guidebook.  Patient learns that the results of the [place] Program have been documented in more than 100 articles published in peer-reviewed journals, and the benefits include reducing risk factors for (and, in some cases, even reversing) high cholesterol, high blood pressure, type 2 diabetes, obesity, and more! An overview of the three key pillars of the Pritikin Program will be covered: eating well, doing regular exercise, and having a healthy mind-set.  WORKSHOPS  Exercise: Exercise Basics: Building Your Action Plan Clinical staff  led group instruction and group discussion with PowerPoint presentation and patient guidebook. To enhance the learning environment the use of posters, models and videos may be added. At the conclusion of this workshop, patients will comprehend the difference between physical activity and exercise, as well as the benefits of incorporating both, into their routine. Patients will understand the FITT (Frequency, Intensity, Time, and Type) principle and how to use it to build an exercise action plan. In addition, safety concerns and other considerations for exercise and cardiac rehab will be addressed by the presenter. The purpose of this lesson is to promote a comprehensive and effective weekly exercise routine in order to improve patients' overall level of fitness.   Managing Heart Disease: Your Path to a Healthier Heart Clinical staff led group instruction and group discussion with PowerPoint presentation and patient guidebook. To enhance the learning environment the use of posters, models and videos may be added.At the conclusion of this workshop, patients will understand the anatomy and physiology of the heart. Additionally, they will understand how Pritikin's three pillars impact the risk factors, the progression, and the management of heart disease.  The purpose of this lesson is to provide a high-level overview of the heart, heart disease, and how the Pritikin lifestyle positively impacts risk factors.  Exercise Biomechanics Clinical staff led group instruction and group discussion with PowerPoint presentation and patient guidebook. To enhance the learning environment the use of posters, models and videos may be added. Patients will learn how the structural parts of their bodies function and how these functions impact their daily activities, movement, and exercise. Patients will learn how to promote a neutral spine, learn how to manage pain, and identify ways to improve their physical movement  in order to promote healthy living. The purpose of this lesson is to expose patients to common physical limitations that impact physical activity. Participants will learn practical ways to adapt and manage aches  and pains, and to minimize their effect on regular exercise. Patients will learn how to maintain good posture while sitting, walking, and lifting.  Balance Training and Fall Prevention  Clinical staff led group instruction and group discussion with PowerPoint presentation and patient guidebook. To enhance the learning environment the use of posters, models and videos may be added. At the conclusion of this workshop, patients will understand the importance of their sensorimotor skills (vision, proprioception, and the vestibular system) in maintaining their ability to balance as they age. Patients will apply a variety of balancing exercises that are appropriate for their current level of function. Patients will understand the common causes for poor balance, possible solutions to these problems, and ways to modify their physical environment in order to minimize their fall risk. The purpose of this lesson is to teach patients about the importance of maintaining balance as they age and ways to minimize their risk of falling.  WORKSHOPS   Nutrition:  Fueling a Scientist, research (physical sciences) led group instruction and group discussion with PowerPoint presentation and patient guidebook. To enhance the learning environment the use of posters, models and videos may be added. Patients will review the foundational principles of the [place] and understand what constitutes a serving size in each of the food groups. Patients will also learn Pritikin-friendly foods that are better choices when away from home and review make-ahead meal and snack options. Calorie density will be reviewed and applied to three nutrition priorities: weight maintenance, weight loss, and weight gain. The purpose of this  lesson is to reinforce (in a group setting) the key concepts around what patients are recommended to eat and how to apply these guidelines when away from home by planning and selecting Pritikin-friendly options. Patients will understand how calorie density may be adjusted for different weight management goals.  Mindful Eating  Clinical staff led group instruction and group discussion with PowerPoint presentation and patient guidebook. To enhance the learning environment the use of posters, models and videos may be added. Patients will briefly review the concepts of the [place] and the importance of low-calorie dense foods. The concept of mindful eating will be introduced as well as the importance of paying attention to internal hunger signals. Triggers for non-hunger eating and techniques for dealing with triggers will be explored. The purpose of this lesson is to provide patients with the opportunity to review the basic principles of the [place], discuss the value of eating mindfully and how to measure internal cues of hunger and fullness using the Hunger Scale. Patients will also discuss reasons for non-hunger eating and learn strategies to use for controlling emotional eating.  Targeting Your Nutrition Priorities Clinical staff led group instruction and group discussion with PowerPoint presentation and patient guidebook. To enhance the learning environment the use of posters, models and videos may be added. Patients will learn how to determine their genetic susceptibility to disease by reviewing their family history. Patients will gain insight into the importance of diet as part of an overall healthy lifestyle in mitigating the impact of genetics and other environmental insults. The purpose of this lesson is to provide patients with the opportunity to assess their personal nutrition priorities by looking at their family history, their own health history and current risk factors.  Patients will also be able to discuss ways of prioritizing and modifying the [place] for their highest risk areas  Menu  Clinical staff led group instruction and group discussion with  PowerPoint presentation and patient guidebook. To enhance the learning environment the use of posters, models and videos may be added. Using menus brought in from [employer], or printed from [employer], patients will apply the [place] dining out guidelines that were presented in the [employer] video. Patients will also be able to practice these guidelines in a variety of provided scenarios. The purpose of this lesson is to provide patients with the opportunity to practice hands-on learning of the [place] with actual menus and practice scenarios.  Label Reading Clinical staff led group instruction and group discussion with PowerPoint presentation and patient guidebook. To enhance the learning environment the use of posters, models and videos may be added. Patients will review and discuss the Pritikin label reading guidelines presented in Pritikin's Label Reading Educational series video. Using fool labels brought in from local grocery stores and markets, patients will apply the label reading guidelines and determine if the packaged food meet the Pritikin guidelines. The purpose of this lesson is to provide patients with the opportunity to review, discuss, and practice hands-on learning of the Pritikin Label Reading guidelines with actual packaged food labels. [place] Workshops are designed to teach patients ways to prepare quick, simple, and affordable recipes at home. The importance of nutrition's role in chronic disease risk reduction is reflected in its emphasis in the overall Pritikin program. By learning how to prepare essential core Pritikin Eating Plan recipes, patients will increase control over what they eat; be able to  customize the flavor of foods without the use of added salt, sugar, or fat; and improve the quality of the food they consume. By learning a set of core recipes which are easily assembled, quickly prepared, and affordable, patients are more likely to prepare more healthy foods at home. These workshops focus on convenient breakfasts, simple entres, side dishes, and desserts which can be prepared with minimal effort and are consistent with nutrition recommendations for cardiovascular risk reduction. Cooking [employer] are taught by a Engineer, materials (RD) who has been trained by the [employer]. The chef or RD has a clear understanding of the importance of minimizing - if not completely eliminating - added fat, sugar, and sodium in recipes. Throughout the series of [place] Workshop sessions, patients will learn about healthy ingredients and efficient methods of cooking to build confidence in their capability to prepare    Cooking School weekly topics:  Adding Flavor- Sodium-Free  Fast and Healthy Breakfasts  Powerhouse Plant-Based Proteins  Satisfying Salads and Dressings  Simple Sides and Sauces  International Cuisine-Spotlight on the [place] Zones  Delicious Desserts  Savory Soups  Efficiency Cooking - Meals in a Snap  Tasty Appetizers and Snacks  Comforting Weekend Breakfasts  One-Pot Wonders   Fast Evening Meals  Easy [place] (Psychosocial): New Thoughts, New Behaviors Clinical staff led group instruction and group discussion with PowerPoint presentation and patient guidebook. To enhance the learning environment the use of posters, models and videos may be added. Patients will learn and practice techniques for developing effective health and lifestyle goals. Patients will be able to effectively apply the goal setting process learned to develop at least one new personal goal.  The  purpose of this lesson is to expose patients to a new skill set of behavior modification techniques such as techniques setting SMART goals, overcoming barriers, and achieving new thoughts  and new behaviors.  Managing Moods and Relationships Clinical staff led group instruction and group discussion with PowerPoint presentation and patient guidebook. To enhance the learning environment the use of posters, models and videos may be added. Patients will learn how emotional and chronic stress factors can impact their health and relationships. They will learn healthy ways to manage their moods and utilize positive coping mechanisms. In addition, ICR patients will learn ways to improve communication skills. The purpose of this lesson is to expose patients to ways of understanding how one's mood and health are intimately connected. Developing a healthy outlook can help build positive relationships and connections with others. Patients will understand the importance of utilizing effective communication skills that include actively listening and being heard. They will learn and understand the importance of the "4 Cs" and especially Connections in fostering of a Healthy Mind-Set.  Healthy Sleep for a Healthy Heart Clinical staff led group instruction and group discussion with PowerPoint presentation and patient guidebook. To enhance the learning environment the use of posters, models and videos may be added. At the conclusion of this workshop, patients will be able to demonstrate knowledge of the importance of sleep to overall health, well-being, and quality of life. They will understand the symptoms of, and treatments for, common sleep disorders. Patients will also be able to identify daytime and nighttime behaviors which impact sleep, and they will be able to apply these tools to help manage sleep-related challenges. The purpose of this lesson is to provide patients with a general overview of sleep and outline the  importance of quality sleep. Patients will learn about a few of the most common sleep disorders. Patients will also be introduced to the concept of "sleep hygiene," and discover ways to self-manage certain sleeping problems through simple daily behavior changes. Finally, the workshop will motivate patients by clarifying the links between quality sleep and their goals of heart-healthy living.   Recognizing and Reducing Stress Clinical staff led group instruction and group discussion with PowerPoint presentation and patient guidebook. To enhance the learning environment the use of posters, models and videos may be added. At the conclusion of this workshop, patients will be able to understand the types of stress reactions, differentiate between acute and chronic stress, and recognize the impact that chronic stress has on their health. They will also be able to apply different coping mechanisms, such as reframing negative self-talk. Patients will have the opportunity to practice a variety of stress management techniques, such as deep abdominal breathing, progressive muscle relaxation, and/or guided imagery.  The purpose of this lesson is to educate patients on the role of stress in their lives and to provide healthy techniques for coping with it.  Learning Barriers/Preferences:  Learning Barriers/Preferences - 12/14/21 1409       Learning Barriers/Preferences   Learning Barriers Exercise Concerns   Pt has balance concerns and dizziness   Learning Preferences Group Instruction;Individual Instruction             Education Topics:  Knowledge Questionnaire Score:  Knowledge Questionnaire Score - 12/14/21 1408       Knowledge Questionnaire Score   Pre Score 19/24             Core Components/Risk Factors/Patient Goals at Admission:  Personal Goals and Risk Factors at Admission - 12/14/21 1411       Core Components/Risk Factors/Patient Goals on Admission    Weight Management Yes;Weight  Loss    Intervention Weight Management: Develop a combined nutrition and  exercise program designed to reach desired caloric intake, while maintaining appropriate intake of nutrient and fiber, sodium and fats, and appropriate energy expenditure required for the weight goal.;Weight Management: Provide education and appropriate resources to help participant work on and attain dietary goals.;Weight Management/Obesity: Establish reasonable short term and long term weight goals.;Obesity: Provide education and appropriate resources to help participant work on and attain dietary goals.    Admit Weight 252 lb 3.3 oz (114.4 kg)    Expected Outcomes Short Term: Continue to assess and modify interventions until short term weight is achieved;Long Term: Adherence to nutrition and physical activity/exercise program aimed toward attainment of established weight goal;Weight Loss: Understanding of general recommendations for a balanced deficit meal plan, which promotes 1-2 lb weight loss per week and includes a negative energy balance of [phone] kcal/d;Understanding recommendations for meals to include 15-35% energy as protein, 25-35% energy from fat, 35-60% energy from carbohydrates, less than '200mg'$  of dietary cholesterol, 20-35 gm of total fiber daily;Understanding of distribution of calorie intake throughout the day with the consumption of 4-5 meals/snacks    Diabetes Yes    Intervention Provide education about signs/symptoms and action to take for hypo/hyperglycemia.;Provide education about proper nutrition, including hydration, and aerobic/resistive exercise prescription along with prescribed medications to achieve blood glucose in normal ranges: Fasting glucose 65-99 mg/dL    Expected Outcomes Short Term: Participant verbalizes understanding of the signs/symptoms and immediate care of hyper/hypoglycemia, proper foot care and importance of medication, aerobic/resistive exercise and nutrition plan for blood glucose  control.;Long Term: Attainment of HbA1C < 7%.    Hypertension Yes    Intervention Provide education on lifestyle modifcations including regular physical activity/exercise, weight management, moderate sodium restriction and increased consumption of fresh fruit, vegetables, and low fat dairy, alcohol moderation, and smoking cessation.;Monitor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

## 2021-12-14 NOTE — Progress Notes (Signed)
Cardiac Rehab Medication Review by a Nurse  Does the patient  feel that his/her medications are working for him/her?  YES   Has the patient been experiencing any side effects to the medications prescribed?  YES  Does the patient measure his/her own blood pressure or blood glucose at home?  YES   Does the patient have any problems obtaining medications due to transportation or finances?  NO  Understanding of regimen: good Understanding of indications: good Potential of compliance: excellent    Nurse  comments: Victor [name] is taking his medications as prescribed and has a good undertanding of what his medications are for. Victor [name] say he has felt light headed off balance ever since his heart  transplant in January. Dr Mosetta Pigeon patient's transplant physician is aware. Orthostatic BP's were checked today and were within normal limits.    Christa See [facility] RN 12/14/2021 11:37 AM

## 2021-12-18 ENCOUNTER — Ambulatory Visit: Admission: RE | Admit: 2021-12-18 | Payer: Medicare Other | Source: Ambulatory Visit

## 2021-12-18 ENCOUNTER — Encounter (HOSPITAL_COMMUNITY)
Admission: RE | Admit: 2021-12-18 | Discharge: 2021-12-18 | Disposition: A | Discharge status: Home / Self Care | Payer: Medicare Other | Source: Ambulatory Visit | Attending: Cardiology | Admitting: Cardiology

## 2021-12-18 DIAGNOSIS — Z941 Heart transplant status: Secondary | ICD-10-CM | POA: Diagnosis not present

## 2021-12-18 LAB — GLUCOSE, CAPILLARY
Glucose-Capillary: 160 mg/dL — ABNORMAL HIGH (ref 70–99)
Glucose-Capillary: 115 mg/dL — ABNORMAL HIGH (ref 70–99)

## 2021-12-18 NOTE — Progress Notes (Signed)
QUALITY OF LIFE SCORE REVIEW  Pt completed Quality of Life survey as a participant in Cardiac Rehab.  Scores 19.0 or below are considered low. Overall 25.01, Health and Function 25.1, socioeconomic 24, physiological and spiritual 24, family 27.6. Patient quality of life slightly altered by physical constraints which limits ability to perform as prior to recent cardiac illness. Offered emotional support and reassurance.  Will continue to monitor and intervene as necessary.   Albertine Grates RN 12/18/2021 11:04 AM

## 2021-12-19 NOTE — Progress Notes (Signed)
Daily Session Note  Patient Details  Name: Victor [name] MRN: [id number] Date of Birth: July 05, 1960 Referring Provider:   Flowsheet Row INTENSIVE CARDIAC REHAB ORIENT from 12/14/2021 in [place]  Referring Provider Dr. Melvyn Novas MD (Dr. Fransico Him MD covering)       Encounter Date: 12/18/2021  Check In:  Session Check In - 12/18/21 1129       Check-In   Supervising physician immediately available to respond to emergencies [facility] immediately available    Physician(s) Dr Posey Pronto    Location [facility]    Staff Present Esmeralda Links BS, ACSM-CEP, Exercise Physiologist;Olinty Celesta Aver, MS, ACSM-CEP, Exercise Physiologist;David Lilyan Punt, MS, ACSM-CEP, CCRP, Exercise Physiologist;Locklyn Henriquez Ardis Hughs, RN    Virtual Visit No    Medication changes reported     No    Fall or balance concerns reported    No    Tobacco Cessation No Change    Current number of cigarettes/nicotine per day     0    Warm-up and Cool-down Performed as group-led instruction    Resistance Training Performed Yes    VAD Patient? No    PAD/SET Patient? No      Pain Assessment   Currently in Pain? No/denies    Pain Score 0-No pain    Multiple Pain Sites No             Capillary Blood Glucose: Results for orders placed or performed during the hospital encounter of 12/18/21 (from the past 24 hour(s))  Glucose, capillary     Status: Abnormal   Collection Time: 12/18/21 10:35 AM  Result Value Ref Range   Glucose-Capillary 160 (H) 70 - 99 mg/dL  Glucose, capillary     Status: Abnormal   Collection Time: 12/18/21 11:42 AM  Result Value Ref Range   Glucose-Capillary 115 (H) 70 - 99 mg/dL     Exercise Prescription Changes - 12/18/21 1039       Response to Exercise   Blood Pressure (Admit) 110/70    Blood Pressure (Exercise) 122/82    Blood Pressure (Exit) 101/68    Heart Rate (Admit) 76 bpm    Heart Rate (Exercise) 82 bpm    Heart Rate (Exit)  78 bpm    Rating of Perceived Exertion (Exercise) 11    Symptoms None    Comments Off to a good start with exercise.    Duration Continue with 30 min of aerobic exercise without signs/symptoms of physical distress.    Intensity THRR unchanged      Progression   Progression Continue to progress workloads to maintain intensity without signs/symptoms of physical distress.    Average METs 1.8      Resistance Training   Training Prescription Yes    Weight 4    Reps 10-15    Time 10 Minutes      Interval Training   Interval Training No      NuStep   Level 3    SPM 84    Minutes 15    METs 1.9      Arm Ergometer   Level 1.5    Minutes 15    METs 1.7             Social History   Tobacco Use  Smoking Status Never  Smokeless Tobacco Never    Goals Met:  Exercise tolerated well No report of concerns or symptoms today Strength training completed today  Goals Unmet:  Not Applicable  Comments: Pt started Intensive cardiac rehab today. Pt tolerated light exercise without difficulty. VSS, telemetry-NSR, asymptomatic. Medication list reconciled. Pt denies barriers to medication compliance. PSYCHOSOCIAL ASSESSMENT:  PHQ-0. Pt exhibits positive coping skills, hopeful outlook with supportive family. No psychosocial needs identified at this time, no psychosocial interventions necessary. Pt enjoys fishing, visiting family in [place], and riding his motorcycle. Pt oriented to exercise equipment and routine. Understanding verbalized. Albertine Grates RN 12/19/2021 8:54 AM    Dr. Fransico Him is Medical Director for Cardiac Rehab at [facility].

## 2021-12-20 ENCOUNTER — Encounter (HOSPITAL_COMMUNITY): Payer: Medicare Other

## 2021-12-21 ENCOUNTER — Encounter: Payer: Self-pay | Admitting: Registered Nurse

## 2021-12-21 ENCOUNTER — Encounter: Payer: Medicare Other | Attending: Physical Medicine and Rehabilitation | Admitting: Registered Nurse

## 2021-12-21 VITALS — BP 114/83 | HR 76 | Ht 72.0 in | Wt 248.0 lb

## 2021-12-21 DIAGNOSIS — M25562 Pain in left knee: Secondary | ICD-10-CM | POA: Diagnosis present

## 2021-12-21 DIAGNOSIS — S8412XS Injury of peroneal nerve at lower leg level, left leg, sequela: Secondary | ICD-10-CM

## 2021-12-21 DIAGNOSIS — M25572 Pain in left ankle and joints of left foot: Secondary | ICD-10-CM | POA: Diagnosis present

## 2021-12-21 DIAGNOSIS — G8929 Other chronic pain: Secondary | ICD-10-CM

## 2021-12-21 DIAGNOSIS — F418 Other specified anxiety disorders: Secondary | ICD-10-CM | POA: Diagnosis present

## 2021-12-21 DIAGNOSIS — G894 Chronic pain syndrome: Secondary | ICD-10-CM | POA: Diagnosis present

## 2021-12-21 DIAGNOSIS — Z79891 Long term (current) use of opiate analgesic: Secondary | ICD-10-CM | POA: Diagnosis present

## 2021-12-21 DIAGNOSIS — Z5181 Encounter for therapeutic drug level monitoring: Secondary | ICD-10-CM

## 2021-12-21 MED ORDER — OXYCODONE HCL 15 MG PO TABS: 15.0000 mg | ORAL_TABLET | Freq: Three times a day (TID) | ORAL | 0 refills | Status: DC | PRN
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
## 2021-12-22 ENCOUNTER — Encounter (HOSPITAL_COMMUNITY): Payer: Medicare Other

## 2021-12-25 ENCOUNTER — Ambulatory Visit (INDEPENDENT_AMBULATORY_CARE_PROVIDER_SITE_OTHER): Payer: Medicare Other | Admitting: Neurology

## 2021-12-25 ENCOUNTER — Encounter (HOSPITAL_COMMUNITY)
Admission: RE | Admit: 2021-12-25 | Discharge: 2021-12-25 | Disposition: A | Discharge status: Home / Self Care | Payer: Medicare Other | Source: Ambulatory Visit | Attending: Cardiology | Admitting: Cardiology

## 2021-12-25 ENCOUNTER — Encounter: Payer: Self-pay | Admitting: Neurology

## 2021-12-25 VITALS — BP 126/88 | HR 83 | Ht 72.0 in | Wt 250.0 lb

## 2021-12-25 DIAGNOSIS — Z941 Heart transplant status: Secondary | ICD-10-CM | POA: Diagnosis not present

## 2021-12-25 DIAGNOSIS — R2689 Other abnormalities of gait and mobility: Secondary | ICD-10-CM

## 2021-12-25 DIAGNOSIS — R2681 Unsteadiness on feet: Secondary | ICD-10-CM

## 2021-12-25 LAB — GLUCOSE, CAPILLARY
Glucose-Capillary: 160 mg/dL — ABNORMAL HIGH (ref 70–99)
Glucose-Capillary: 144 mg/dL — ABNORMAL HIGH (ref 70–99)
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
## 2021-12-25 NOTE — Patient Instructions (Signed)
Continue with cardiac rehab  If not helpful, please let us know, we will send you to vestibular rehab

## 2021-12-26 NOTE — Progress Notes (Signed)
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 exercise include fitness, good health, weight management, more energy, better sleep, less stress, and overall well-being.  Medical   Heart Disease Risk Reduction Clinical staff conducted group or individual video education with verbal and written material and guidebook.  Patient learns our heart is our most vital organ as it circulates oxygen, nutrients, white blood cells, and hormones throughout the entire body, and carries waste away. Data supports a plant-based eating plan like the Pritikin Program for its effectiveness in slowing progression of and reversing heart disease. The video provides a number of recommendations to address heart  disease.   Metabolic Syndrome and Belly Fat  Clinical staff conducted group or individual video education with verbal and written material and guidebook.  Patient learns what metabolic syndrome is, how it leads to heart disease, and how one can reverse it and keep it from coming back. You have metabolic syndrome if you have 3 of the following 5 criteria: abdominal obesity, high blood pressure, high triglycerides, low HDL cholesterol, and high blood sugar.  Hypertension and Heart Disease Clinical staff conducted group or individual video education with verbal and written material and guidebook.  Patient learns that high blood pressure, or hypertension, is very common in the [place]. Hypertension is largely due to excessive salt intake, but other important risk factors include being overweight, physical inactivity, drinking too much alcohol, smoking, and not eating enough potassium from fruits and vegetables. High blood pressure is a leading risk factor for heart attack, stroke, congestive heart failure, dementia, kidney failure, and premature death. Long-term effects of excessive salt intake include stiffening of the arteries and thickening of heart muscle and organ damage. Recommendations include ways to reduce hypertension and the risk of heart disease.  Diseases of Our Time - Focusing on Diabetes Clinical staff conducted group or individual video education with verbal and written material and guidebook.  Patient learns why the best way to stop diseases of our time is prevention, through food and other lifestyle changes. Medicine (such as prescription pills and surgeries) is often only a Band-Aid on the problem, not a long-term solution. Most common diseases of our time include obesity, type 2 diabetes, hypertension, heart disease, and cancer. The Pritikin Program is recommended and has been proven to help reduce, reverse, and/or prevent the damaging effects of metabolic syndrome.  Nutrition    Overview of the Pritikin Eating Plan  Clinical staff conducted group or individual video education with verbal and written material and guidebook.  Patient learns about the [place] for disease risk reduction. The Day emphasizes a wide variety of unrefined, minimally-processed carbohydrates, like fruits, vegetables, whole grains, and legumes. Go, Caution, and Stop food choices are explained. Plant-based and lean animal proteins are emphasized. Rationale provided for low sodium intake for blood pressure control, low added sugars for blood sugar stabilization, and low added fats and oils for coronary artery disease risk reduction and weight management.  Calorie Density  Clinical staff conducted group or individual video education with verbal and written material and guidebook.  Patient learns about calorie density and how it impacts the Pritikin Eating Plan. Knowing the characteristics of the food you choose will help you decide whether those foods will lead to weight gain or weight loss, and whether you want to consume more or less of them. Weight loss is usually a  side effect of the Pritikin Eating Plan because of its focus on low calorie-dense foods.  Label Reading  Clinical staff conducted group or individual video education with verbal and written material and guidebook.  Patient learns about the Pritikin recommended label reading guidelines and corresponding recommendations regarding calorie density, added sugars, sodium content, and whole grains.  Dining Out - Part 1  Clinical staff conducted group or individual video education with verbal and written material and guidebook.  Patient learns that restaurant meals can be sabotaging because they can be so high in calories, fat, sodium, and/or sugar. Patient learns recommended strategies on how to positively address this and avoid unhealthy pitfalls.  Facts on Fats  Clinical staff conducted group or individual video  education with verbal and written material and guidebook.  Patient learns that lifestyle modifications can be just as effective, if not more so, as many medications for lowering your risk of heart disease. A Pritikin lifestyle can help to reduce your risk of inflammation and atherosclerosis (cholesterol build-up, or plaque, in the artery walls). Lifestyle interventions such as dietary choices and physical activity address the cause of atherosclerosis. A review of the types of fats and their impact on blood cholesterol levels, along with dietary recommendations to reduce fat intake is also included.  Nutrition Action Plan  Clinical staff conducted group or individual video education with verbal and written material and guidebook.  Patient learns how to incorporate Pritikin recommendations into their lifestyle. Recommendations include planning and keeping personal health goals in mind as an important part of their success.  Healthy Mind-Set    Healthy Minds, Bodies, Hearts  Clinical staff conducted group or individual video education with verbal and written material and guidebook.  Patient learns how to identify when they are stressed. Video will discuss the impact of that stress, as well as the many benefits of stress management. Patient will also be introduced to stress management techniques. The way we think, act, and feel has an impact on our hearts.  How Our Thoughts Can Heal Our Hearts  Clinical staff conducted group or individual video education with verbal and written material and guidebook.  Patient learns that negative thoughts can cause depression and anxiety. This can result in negative lifestyle behavior and serious health problems. Cognitive behavioral therapy is an effective method to help control our thoughts in order to change and improve our emotional outlook.  Additional Videos:  Exercise    Improving Performance  Clinical staff conducted group or individual video education with  verbal and written material and guidebook.  Patient learns to use a non-linear approach by alternating intensity levels and lengths of time spent exercising to help burn more calories and lose more body fat. Cardiovascular exercise helps improve heart health, metabolism, hormonal balance, blood sugar control, and recovery from fatigue. Resistance training improves strength, endurance, balance, coordination, reaction time, metabolism, and muscle mass. Flexibility exercise improves circulation, posture, and balance. Seek guidance from your physician and exercise physiologist before implementing an exercise routine and learn your capabilities and proper form for all exercise.  Introduction to Yoga  Clinical staff conducted group or individual video education with verbal and written material and guidebook.  Patient learns about yoga, a discipline of the coming together of mind, breath, and body. The benefits of yoga include improved flexibility, improved range of motion, better posture and core strength, increased lung function, weight loss, and positive self-image. Yoga's heart health benefits include lowered blood pressure, healthier heart rate, decreased cholesterol and triglyceride levels,  improved immune function, and reduced stress. Seek guidance from your physician and exercise physiologist before implementing an exercise routine and learn your capabilities and proper form for all exercise.  Medical   Aging: Enhancing Your Quality of Life  Clinical staff conducted group or individual video education with verbal and written material and guidebook.  Patient learns key strategies and recommendations to stay in good physical health and enhance quality of life, such as prevention strategies, having an advocate, securing a [place], and keeping a list of medications and system for tracking them. It also discusses how to avoid risk for bone loss.  Biology of Weight Control   Clinical staff conducted group or individual video education with verbal and written material and guidebook.  Patient learns that weight gain occurs because we consume more calories than we burn (eating more, moving less). Even if your body weight is normal, you may have higher ratios of fat compared to muscle mass. Too much body fat puts you at increased risk for cardiovascular disease, heart attack, stroke, type 2 diabetes, and obesity-related cancers. In addition to exercise, following the [place] can help reduce your risk.  Decoding Lab Results  Clinical staff conducted group or individual video education with verbal and written material and guidebook.  Patient learns that lab test reflects one measurement whose values change over time and are influenced by many factors, including medication, stress, sleep, exercise, food, hydration, pre-existing medical conditions, and more. It is recommended to use the knowledge from this video to become more involved with your lab results and evaluate your numbers to speak with your doctor.   Diseases of Our Time - Overview  Clinical staff conducted group or individual video education with verbal and written material and guidebook.  Patient learns that according to the CDC, 50% to 70% of chronic diseases (such as obesity, type 2 diabetes, elevated lipids, hypertension, and heart disease) are avoidable through lifestyle improvements including healthier food choices, listening to satiety cues, and increased physical activity.  Sleep Disorders Clinical staff conducted group or individual video education with verbal and written material and guidebook.  Patient learns how good quality and duration of sleep are important to overall health and well-being. Patient also learns about sleep disorders and how they impact health along with recommendations to address them, including discussing with a physician.  Nutrition  Dining Out - Part 2 Clinical staff  conducted group or individual video education with verbal and written material and guidebook.  Patient learns how to plan ahead and communicate in order to maximize their dining experience in a healthy and nutritious manner. Included are recommended food choices based on the type of restaurant the patient is visiting.   Fueling a Best boy conducted group or individual video education with verbal and written material and guidebook.  There is a strong connection between our food choices and our health. Diseases like obesity and type 2 diabetes are very prevalent and are in large-part due to lifestyle choices. The Pritikin Eating Plan provides plenty of food and hunger-curbing satisfaction. It is easy to follow, affordable, and helps reduce health risks.  Menu Workshop  Clinical staff conducted group or individual video education with verbal and written material and guidebook.  Patient learns that restaurant meals can sabotage health goals because they are often packed with calories, fat, sodium, and sugar. Recommendations include strategies to plan ahead and to communicate with the manager, chef, or server to help  order a healthier meal.  Planning Your Eating Strategy  Clinical staff conducted group or individual video education with verbal and written material and guidebook.  Patient learns about the [place] and its benefit of reducing the risk of disease. The [place] does not focus on calories. Instead, it emphasizes high-quality, nutrient-rich foods. By knowing the characteristics of the foods, we choose, we can determine their calorie density and make informed decisions.  Targeting Your Nutrition Priorities  Clinical staff conducted group or individual video education with verbal and written material and guidebook.  Patient learns that lifestyle habits have a tremendous impact on disease risk and progression. This video provides eating and physical  activity recommendations based on your personal health goals, such as reducing LDL cholesterol, losing weight, preventing or controlling type 2 diabetes, and reducing high blood pressure.  Vitamins and Minerals  Clinical staff conducted group or individual video education with verbal and written material and guidebook.  Patient learns different ways to obtain key vitamins and minerals, including through a recommended healthy diet. It is important to discuss all supplements you take with your doctor.   Healthy Mind-Set    Smoking Cessation  Clinical staff conducted group or individual video education with verbal and written material and guidebook.  Patient learns that cigarette smoking and tobacco addiction pose a serious health risk which affects millions of people. Stopping smoking will significantly reduce the risk of heart disease, lung disease, and many forms of cancer. Recommended strategies for quitting are covered, including working with your doctor to develop a successful plan.  Culinary   Becoming a Financial trader conducted group or individual video education with verbal and written material and guidebook.  Patient learns that cooking at home can be healthy, cost-effective, quick, and puts them in control. Keys to cooking healthy recipes will include looking at your recipe, assessing your equipment needs, planning ahead, making it simple, choosing cost-effective seasonal ingredients, and limiting the use of added fats, salts, and sugars.  Cooking - Breakfast and Snacks  Clinical staff conducted group or individual video education with verbal and written material and guidebook.  Patient learns how important breakfast is to satiety and nutrition through the entire day. Recommendations include key foods to eat during breakfast to help stabilize blood sugar levels and to prevent overeating at meals later in the day. Planning ahead is also a key component.  Cooking - Retail buyer conducted group or individual video education with verbal and written material and guidebook.  Patient learns eating strategies to improve overall health, including an approach to cook more at home. Recommendations include thinking of animal protein as a side on your plate rather than center stage and focusing instead on lower calorie dense options like vegetables, fruits, whole grains, and plant-based proteins, such as beans. Making sauces in large quantities to freeze for later and leaving the skin on your vegetables are also recommended to maximize your experience.  Cooking - Healthy Salads and Dressing Clinical staff conducted group or individual video education with verbal and written material and guidebook.  Patient learns that vegetables, fruits, whole grains, and legumes are the foundations of the [place]. Recommendations include how to incorporate each of these in flavorful and healthy salads, and how to create homemade salad dressings. Proper handling of ingredients is also covered. Cooking - Soups and [employer] - Soups and Desserts Clinical staff conducted group or individual video education with verbal  and written material and guidebook.  Patient learns that Pritikin soups and desserts make for easy, nutritious, and delicious snacks and meal components that are low in sodium, fat, sugar, and calorie density, while high in vitamins, minerals, and filling fiber. Recommendations include simple and healthy ideas for soups and desserts.   Overview     The Pritikin Solution Program Overview Clinical staff conducted group or individual video education with verbal and written material and guidebook.  Patient learns that the results of the [place] Program have been documented in more than 100 articles published in peer-reviewed journals, and the benefits include reducing risk factors for (and, in some cases, even reversing) high cholesterol, high  blood pressure, type 2 diabetes, obesity, and more! An overview of the three key pillars of the Pritikin Program will be covered: eating well, doing regular exercise, and having a healthy mind-set.  WORKSHOPS  Exercise: Exercise Basics: Building Your Action Plan Clinical staff led group instruction and group discussion with PowerPoint presentation and patient guidebook. To enhance the learning environment the use of posters, models and videos may be added. At the conclusion of this workshop, patients will comprehend the difference between physical activity and exercise, as well as the benefits of incorporating both, into their routine. Patients will understand the FITT (Frequency, Intensity, Time, and Type) principle and how to use it to build an exercise action plan. In addition, safety concerns and other considerations for exercise and cardiac rehab will be addressed by the presenter. The purpose of this lesson is to promote a comprehensive and effective weekly exercise routine in order to improve patients' overall level of fitness.   Managing Heart Disease: Your Path to a Healthier Heart Clinical staff led group instruction and group discussion with PowerPoint presentation and patient guidebook. To enhance the learning environment the use of posters, models and videos may be added.At the conclusion of this workshop, patients will understand the anatomy and physiology of the heart. Additionally, they will understand how Pritikin's three pillars impact the risk factors, the progression, and the management of heart disease.  The purpose of this lesson is to provide a high-level overview of the heart, heart disease, and how the Pritikin lifestyle positively impacts risk factors.  Exercise Biomechanics Clinical staff led group instruction and group discussion with PowerPoint presentation and patient guidebook. To enhance the learning environment the use of posters, models and videos may be added.  Patients will learn how the structural parts of their bodies function and how these functions impact their daily activities, movement, and exercise. Patients will learn how to promote a neutral spine, learn how to manage pain, and identify ways to improve their physical movement in order to promote healthy living. The purpose of this lesson is to expose patients to common physical limitations that impact physical activity. Participants will learn practical ways to adapt and manage aches and pains, and to minimize their effect on regular exercise. Patients will learn how to maintain good posture while sitting, walking, and lifting.  Balance Training and Fall Prevention  Clinical staff led group instruction and group discussion with PowerPoint presentation and patient guidebook. To enhance the learning environment the use of posters, models and videos may be added. At the conclusion of this workshop, patients will understand the importance of their sensorimotor skills (vision, proprioception, and the vestibular system) in maintaining their ability to balance as they age. Patients will apply a variety of balancing exercises that are appropriate for their current level of function. Patients will understand  the common causes for poor balance, possible solutions to these problems, and ways to modify their physical environment in order to minimize their fall risk. The purpose of this lesson is to teach patients about the importance of maintaining balance as they age and ways to minimize their risk of falling.  WORKSHOPS   Nutrition:  Fueling a Scientist, research (physical sciences) led group instruction and group discussion with PowerPoint presentation and patient guidebook. To enhance the learning environment the use of posters, models and videos may be added. Patients will review the foundational principles of the [place] and understand what constitutes a serving size in each of the food groups.  Patients will also learn Pritikin-friendly foods that are better choices when away from home and review make-ahead meal and snack options. Calorie density will be reviewed and applied to three nutrition priorities: weight maintenance, weight loss, and weight gain. The purpose of this lesson is to reinforce (in a group setting) the key concepts around what patients are recommended to eat and how to apply these guidelines when away from home by planning and selecting Pritikin-friendly options. Patients will understand how calorie density may be adjusted for different weight management goals.  Mindful Eating  Clinical staff led group instruction and group discussion with PowerPoint presentation and patient guidebook. To enhance the learning environment the use of posters, models and videos may be added. Patients will briefly review the concepts of the [place] and the importance of low-calorie dense foods. The concept of mindful eating will be introduced as well as the importance of paying attention to internal hunger signals. Triggers for non-hunger eating and techniques for dealing with triggers will be explored. The purpose of this lesson is to provide patients with the opportunity to review the basic principles of the [place], discuss the value of eating mindfully and how to measure internal cues of hunger and fullness using the Hunger Scale. Patients will also discuss reasons for non-hunger eating and learn strategies to use for controlling emotional eating.  Targeting Your Nutrition Priorities Clinical staff led group instruction and group discussion with PowerPoint presentation and patient guidebook. To enhance the learning environment the use of posters, models and videos may be added. Patients will learn how to determine their genetic susceptibility to disease by reviewing their family history. Patients will gain insight into the importance of diet as part of an overall healthy  lifestyle in mitigating the impact of genetics and other environmental insults. The purpose of this lesson is to provide patients with the opportunity to assess their personal nutrition priorities by looking at their family history, their own health history and current risk factors. Patients will also be able to discuss ways of prioritizing and modifying the [place] for their highest risk areas  Menu  Clinical staff led group instruction and group discussion with PowerPoint presentation and patient guidebook. To enhance the learning environment the use of posters, models and videos may be added. Using menus brought in from [employer], or printed from [employer], patients will apply the [place] dining out guidelines that were presented in the [employer] video. Patients will also be able to practice these guidelines in a variety of provided scenarios. The purpose of this lesson is to provide patients with the opportunity to practice hands-on learning of the [place] with actual menus and practice scenarios.  Label Reading Clinical staff led group instruction and group discussion with PowerPoint presentation and patient  guidebook. To enhance the learning environment the use of posters, models and videos may be added. Patients will review and discuss the Pritikin label reading guidelines presented in Pritikin's Label Reading Educational series video. Using fool labels brought in from local grocery stores and markets, patients will apply the label reading guidelines and determine if the packaged food meet the Pritikin guidelines. The purpose of this lesson is to provide patients with the opportunity to review, discuss, and practice hands-on learning of the Pritikin Label Reading guidelines with actual packaged food labels. [place] Workshops are designed to teach patients ways to prepare quick, simple, and  affordable recipes at home. The importance of nutrition's role in chronic disease risk reduction is reflected in its emphasis in the overall Pritikin program. By learning how to prepare essential core Pritikin Eating Plan recipes, patients will increase control over what they eat; be able to customize the flavor of foods without the use of added salt, sugar, or fat; and improve the quality of the food they consume. By learning a set of core recipes which are easily assembled, quickly prepared, and affordable, patients are more likely to prepare more healthy foods at home. These workshops focus on convenient breakfasts, simple entres, side dishes, and desserts which can be prepared with minimal effort and are consistent with nutrition recommendations for cardiovascular risk reduction. [employer] are taught by a Engineer, materials (RD) who has been trained by the [employer]. The chef or RD has a clear understanding of the importance of minimizing - if not completely eliminating - added fat, sugar, and sodium in recipes. Throughout the series of [place] Workshop sessions, patients will learn about healthy ingredients and efficient methods of cooking to build confidence in their capability to prepare    Cooking School weekly topics:  Adding Flavor- Sodium-Free  Fast and Healthy Breakfasts  Powerhouse Plant-Based Proteins  Satisfying Salads and Dressings  Simple Sides and Sauces  International Cuisine-Spotlight on the [place] Zones  Delicious Desserts  Savory Soups  Efficiency Cooking - Meals in a Snap  Tasty Appetizers and Snacks  Comforting Weekend Breakfasts  One-Pot Wonders   Fast Evening Meals  Easy [place] (Psychosocial): New Thoughts, New Behaviors Clinical staff led group instruction and group discussion with PowerPoint presentation and patient guidebook. To enhance the  learning environment the use of posters, models and videos may be added. Patients will learn and practice techniques for developing effective health and lifestyle goals. Patients will be able to effectively apply the goal setting process learned to develop at least one new personal goal.  The purpose of this lesson is to expose patients to a new skill set of behavior modification techniques such as techniques setting SMART goals, overcoming barriers, and achieving new thoughts and new behaviors.  Managing Moods and Relationships Clinical staff led group instruction and group discussion with PowerPoint presentation and patient guidebook. To enhance the learning environment the use of posters, models and videos may be added. Patients will learn how emotional and chronic stress factors can impact their health and relationships. They will learn healthy ways to manage their moods and utilize positive coping mechanisms. In addition, ICR patients will learn ways to improve communication skills. The purpose of this lesson is to expose patients to ways of understanding how one's mood and health are intimately connected. Developing a healthy outlook can help build positive relationships and connections  with others. Patients will understand the importance of utilizing effective communication skills that include actively listening and being heard. They will learn and understand the importance of the "4 Cs" and especially Connections in fostering of a Healthy Mind-Set.  Healthy Sleep for a Healthy Heart Clinical staff led group instruction and group discussion with PowerPoint presentation and patient guidebook. To enhance the learning environment the use of posters, models and videos may be added. At the conclusion of this workshop, patients will be able to demonstrate knowledge of the importance of sleep to overall health, well-being, and quality of life. They will understand the symptoms of, and treatments for, common  sleep disorders. Patients will also be able to identify daytime and nighttime behaviors which impact sleep, and they will be able to apply these tools to help manage sleep-related challenges. The purpose of this lesson is to provide patients with a general overview of sleep and outline the importance of quality sleep. Patients will learn about a few of the most common sleep disorders. Patients will also be introduced to the concept of "sleep hygiene," and discover ways to self-manage certain sleeping problems through simple daily behavior changes. Finally, the workshop will motivate patients by clarifying the links between quality sleep and their goals of heart-healthy living.   Recognizing and Reducing Stress Clinical staff led group instruction and group discussion with PowerPoint presentation and patient guidebook. To enhance the learning environment the use of posters, models and videos may be added. At the conclusion of this workshop, patients will be able to understand the types of stress reactions, differentiate between acute and chronic stress, and recognize the impact that chronic stress has on their health. They will also be able to apply different coping mechanisms, such as reframing negative self-talk. Patients will have the opportunity to practice a variety of stress management techniques, such as deep abdominal breathing, progressive muscle relaxation, and/or guided imagery.  The purpose of this lesson is to educate patients on the role of stress in their lives and to provide healthy techniques for coping with it.  Learning Barriers/Preferences:  Learning Barriers/Preferences - 12/14/21 1409       Learning Barriers/Preferences   Learning Barriers Exercise Concerns   Pt has balance concerns and dizziness   Learning Preferences Group Instruction;Individual Instruction             Education Topics:  Knowledge Questionnaire Score:  Knowledge Questionnaire Score - 12/14/21 1408        Knowledge Questionnaire Score   Pre Score 19/24             Core Components/Risk Factors/Patient Goals at Admission:  Personal Goals and Risk Factors at Admission - 12/14/21 1411       Core Components/Risk Factors/Patient Goals on Admission    Weight Management Yes;Weight Loss    Intervention Weight Management: Develop a combined nutrition and exercise program designed to reach desired caloric intake, while maintaining appropriate intake of nutrient and fiber, sodium and fats, and appropriate energy expenditure required for the weight goal.;Weight Management: Provide education and appropriate resources to help participant work on and attain dietary goals.;Weight Management/Obesity: Establish reasonable short term and long term weight goals.;Obesity: Provide education and appropriate resources to help participant work on and attain dietary goals.    Admit Weight 252 lb 3.3 oz (114.4 kg)    Expected Outcomes Short Term: Continue to assess and modify interventions until short term weight is achieved;Long Term: Adherence to nutrition and physical activity/exercise program aimed toward attainment of  established weight goal;Weight Loss: Understanding of general recommendations for a balanced deficit meal plan, which promotes 1-2 lb weight loss per week and includes a negative energy balance of [phone] kcal/d;Understanding recommendations for meals to include 15-35% energy as protein, 25-35% energy from fat, 35-60% energy from carbohydrates, less than '200mg'$  of dietary cholesterol, 20-35 gm of total fiber daily;Understanding of distribution of calorie intake throughout the day with the consumption of 4-5 meals/snacks    Diabetes Yes    Intervention Provide education about signs/symptoms and action to take for hypo/hyperglycemia.;Provide education about proper nutrition, including hydration, and aerobic/resistive exercise prescription along with prescribed medications to achieve blood glucose in normal  ranges: Fasting glucose 65-99 mg/dL    Expected Outcomes Short Term: Participant verbalizes understanding of the signs/symptoms and immediate care of hyper/hypoglycemia, proper foot care and importance of medication, aerobic/resistive exercise and nutrition plan for blood glucose control.;Long Term: Attainment of HbA1C < 7%.    Hypertension Yes    Intervention Provide education on lifestyle modifcations including regular physical activity/exercise, weight management, moderate sodium restriction and increased consumption of fresh fruit, vegetables, and low fat dairy, alcohol moderation, and smoking cessation.;Monitor prescription use compliance.    Expected Outcomes Short Term: Continued assessment and intervention until BP is < 140/78m HG in hypertensive participants. < 130/852mHG in hypertensive participants with diabetes, heart failure or chronic kidney disease.;Long Term: Maintenance of blood pressure at goal levels.    Lipids Yes    Intervention Provide education and support for participant on nutrition & aerobic/resistive exercise along with prescribed medications to achieve LDL '70mg'$ , HDL >'40mg'$ .    Expected Outcomes Short Term: Participant states understanding of desired cholesterol values and is compliant with medications prescribed. Participant is following exercise prescription and nutrition guidelines.;Long Term: Cholesterol controlled with medications as prescribed, with individualized exercise RX and with personalized nutrition plan. Value goals: LDL < '70mg'$ , HDL > 40 mg.    Stress Yes    Intervention Offer individual and/or small group education and counseling on adjustment to heart disease, stress management and health-related lifestyle change. Teach and support self-help strategies.;Refer participants experiencing significant psychosocial distress to appropriate mental health specialists for further evaluation and treatment. When possible, include family members and significant others in  education/counseling sessions.    Expected Outcomes Short Term: Participant demonstrates changes in health-related behavior, relaxation and other stress management skills, ability to obtain effective social support, and compliance with psychotropic medications if prescribed.;Long Term: Emotional wellbeing is indicated by absence of clinically significant psychosocial distress or social isolation.    Personal Goal Other Yes    Personal Goal Short term: walk with less SOB Long term: gain strength    Intervention Will continue to monitor pt and progress workloads as tolerated without sign or symptom    Expected Outcomes Pt will achieve his goals and gain strength             Core Components/Risk Factors/Patient Goals Review:   Goals and Risk Factor Review     Row Name 12/18/21 1533 12/26/21 1422           Core Components/Risk Factors/Patient Goals Review   Personal Goals Review Weight Management/Obesity;Stress;Hypertension;Diabetes;Lipids Weight Management/Obesity;Stress;Hypertension;Diabetes;Lipids      Review patient started intensive cardiac rehab today, pt tolerated well, vss, denies pain. GrMarya Amsleras completed 2 session of intensive cardiac rehab and is off to a good start to exercise. Vital signs and CBG's have been stable      Expected Outcomes Patient will maintain a healthy lifestyle  as he is provided the knowledge and tools neccessary to complete goal, patient will increase exercise stamina during program. Patient will maintain a healthy lifestyle as he is provided the knowledge and tools neccessary to complete goal, patient will increase exercise stamina during program.               Core Components/Risk Factors/Patient Goals at Discharge (Final Review):   Goals and Risk Factor Review - 12/26/21 1422       Core Components/Risk Factors/Patient Goals Review   Personal Goals Review Weight Management/Obesity;Stress;Hypertension;Diabetes;Lipids    Review Victor [name] has completed 2  session of intensive cardiac rehab and is off to a good start to exercise. Vital signs and CBG's have been stable    Expected Outcomes Patient will maintain a healthy lifestyle as he is provided the knowledge and tools neccessary to complete goal, patient will increase exercise stamina during program.             ITP Comments:  ITP Comments     Row Name 12/14/21 1237 12/26/21 1418         ITP Comments Dr Fransico Him MD, Medical Director. Introduction to [facility] Education Program/Intensvie cardiac Rehab. Initial [facility] Orientation Packet Reviewed with the Patient 30 Day ITP Review Victor [name] started intensive cardiac rehab on 12/14/21 and is off to a good start to exercise.               Comments: See ITP Comments

## 2021-12-27 ENCOUNTER — Encounter (HOSPITAL_COMMUNITY)
Admission: RE | Admit: 2021-12-27 | Discharge: 2021-12-27 | Disposition: A | Discharge status: Home / Self Care | Payer: Medicare Other | Source: Ambulatory Visit | Attending: Cardiology | Admitting: Cardiology

## 2021-12-27 DIAGNOSIS — Z941 Heart transplant status: Secondary | ICD-10-CM

## 2021-12-27 LAB — TOXASSURE SELECT,+ANTIDEPR,UR

## 2021-12-27 LAB — GLUCOSE, CAPILLARY
Glucose-Capillary: 167 mg/dL — ABNORMAL HIGH (ref 70–99)
Glucose-Capillary: 159 mg/dL — ABNORMAL HIGH (ref 70–99)

## 2021-12-29 ENCOUNTER — Encounter (HOSPITAL_COMMUNITY): Payer: Medicare Other

## 2021-12-29 ENCOUNTER — Telehealth: Payer: Self-pay | Admitting: *Deleted

## 2021-12-29 NOTE — Telephone Encounter (Signed)
Urine drug screen for this encounter is consistent for prescribed medication 

## 2022-01-01 ENCOUNTER — Encounter (HOSPITAL_COMMUNITY)
Admission: RE | Admit: 2022-01-01 | Discharge: 2022-01-01 | Disposition: A | Discharge status: Home / Self Care | Payer: Medicare Other | Source: Ambulatory Visit | Attending: Cardiology | Admitting: Cardiology

## 2022-01-01 DIAGNOSIS — Z941 Heart transplant status: Secondary | ICD-10-CM | POA: Diagnosis not present

## 2022-01-02 NOTE — Progress Notes (Signed)
Questionable nonsustained exercised induced bundle branch block noted during exercise at cardiac rehab. Patient asymptomatic. Will fax exercise flow sheets to Dr. Eleanora Neighbor office for review.Harrell Gave RN BSN

## 2022-01-03 ENCOUNTER — Encounter (HOSPITAL_COMMUNITY)
Admission: RE | Admit: 2022-01-03 | Discharge: 2022-01-03 | Disposition: A | Discharge status: Home / Self Care | Payer: Medicare Other | Source: Ambulatory Visit | Attending: Cardiology | Admitting: Cardiology

## 2022-01-03 DIAGNOSIS — Z941 Heart transplant status: Secondary | ICD-10-CM

## 2022-01-05 ENCOUNTER — Encounter (HOSPITAL_COMMUNITY): Payer: Medicare Other

## 2022-01-10 ENCOUNTER — Encounter (HOSPITAL_COMMUNITY): Payer: Medicare Other

## 2022-01-12 ENCOUNTER — Ambulatory Visit
Admission: RE | Admit: 2022-01-12 | Discharge: 2022-01-12 | Disposition: A | Discharge status: Home / Self Care | Payer: Medicare Other | Source: Ambulatory Visit | Attending: Physician Assistant | Admitting: Physician Assistant

## 2022-01-12 ENCOUNTER — Telehealth (HOSPITAL_COMMUNITY): Payer: Self-pay | Admitting: *Deleted

## 2022-01-12 ENCOUNTER — Encounter (HOSPITAL_COMMUNITY): Payer: Medicare Other

## 2022-01-12 DIAGNOSIS — R42 Dizziness and giddiness: Secondary | ICD-10-CM

## 2022-01-12 NOTE — Telephone Encounter (Signed)
Spoke with the Victor [name] he was out of town to check on his mother. Victor [name] plans to return to exercise on Monday.Harrell Gave RN BSN

## 2022-01-15 ENCOUNTER — Encounter (HOSPITAL_COMMUNITY)
Admission: RE | Admit: 2022-01-15 | Discharge: 2022-01-15 | Disposition: A | Discharge status: Home / Self Care | Payer: Medicare Other | Source: Ambulatory Visit | Attending: Cardiology | Admitting: Cardiology

## 2022-01-15 DIAGNOSIS — Z4889 Encounter for other specified surgical aftercare: Secondary | ICD-10-CM | POA: Diagnosis not present

## 2022-01-15 DIAGNOSIS — Z941 Heart transplant status: Secondary | ICD-10-CM | POA: Insufficient documentation

## 2022-01-15 NOTE — Progress Notes (Signed)
Victor [name] returned to exercise today after being out of town for a week. Weight today is 116.2 which is up 2.3 kg from Greg's last exercise session on 01/03/22. Victor [name] denies shortness of breath or swelling. Victor [name] said that he ate more that usual when visiting his Mom in [place].Will call the transplant coordinator to notify.Barnet Pall, RN,BSN 01/15/2022 11:30 AM

## 2022-01-17 ENCOUNTER — Encounter (HOSPITAL_COMMUNITY)
Admission: RE | Admit: 2022-01-17 | Discharge: 2022-01-17 | Disposition: A | Discharge status: Home / Self Care | Payer: Medicare Other | Source: Ambulatory Visit | Attending: Cardiology | Admitting: Cardiology

## 2022-01-17 DIAGNOSIS — Z941 Heart transplant status: Secondary | ICD-10-CM | POA: Diagnosis not present

## 2022-01-17 NOTE — Progress Notes (Signed)
Reviewed home exercise guidelines with patient including endpoints, temperature precautions, target heart rate and rate of perceived exertion. [facility] and plans to walk and swim as his mode of home exercise. Patient plans to use weight machines for his resistance training starting light and building up as tolerated. Patient voices understanding of instructions given.  Sol Passer, MS, ACSM CEP

## 2022-01-19 ENCOUNTER — Encounter (HOSPITAL_COMMUNITY)
Admission: RE | Admit: 2022-01-19 | Discharge: 2022-01-19 | Disposition: A | Discharge status: Home / Self Care | Payer: Medicare Other | Source: Ambulatory Visit | Attending: Cardiology | Admitting: Cardiology

## 2022-01-19 DIAGNOSIS — Z941 Heart transplant status: Secondary | ICD-10-CM | POA: Diagnosis not present

## 2022-01-22 ENCOUNTER — Encounter (HOSPITAL_COMMUNITY)
Admission: RE | Admit: 2022-01-22 | Discharge: 2022-01-22 | Disposition: A | Discharge status: Home / Self Care | Payer: Medicare Other | Source: Ambulatory Visit | Attending: Cardiology | Admitting: Cardiology

## 2022-01-22 DIAGNOSIS — Z941 Heart transplant status: Secondary | ICD-10-CM

## 2022-01-23 ENCOUNTER — Encounter: Payer: Self-pay | Admitting: Neurology

## 2022-01-23 NOTE — Progress Notes (Signed)
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 He is status post cardiac transplant. He is on Aspirin, statin and anti-hypertensive. His latest echo 8/16 shows an EF >55%  Dr. April Manson

## 2022-01-23 NOTE — Progress Notes (Signed)
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 cardiac rehab    Interventions Encouraged to attend Cardiac Rehabilitation for the exercise;Relaxation education;Stress management education    Continue Psychosocial Services  Follow up required by staff      Initial  Review   Source of Stress Concerns Chronic Illness    Comments Will continue to monitor and offer support as needed             Vocational Rehabilitation: Provide vocational rehab assistance to qualifying candidates.   Vocational Rehab Evaluation & Intervention:  Vocational Rehab - 12/14/21 1250       Initial Vocational Rehab Evaluation & Intervention   Assessment  shows need for Vocational Rehabilitation No   Victor [name] is retired and does not need vocatinal rehab at [employer] time            Education: Education Goals: Education classes will be provided on a weekly basis, covering required topics. Participant will state understanding/return demonstration of topics presented.    Education     Row Name 12/18/21 1600     Education   Cardiac Education Topics Pritikin   IT sales professional Nutrition   Nutrition Workshop Fueling a Designer, multimedia   Instruction Review Code 1- Engineer, civil (consulting) Start Time 1150   Class Stop Time 1240   Class Time Calculation (min) 50 min    [place] Name 12/25/21 1300     Education   Cardiac Education Topics Pritikin   [employer]     Workshops   Educator Exercise Physiologist   [facility] Psychosocial   Psychosocial Workshop Recognizing and Reducing Stress   Instruction Review Code 1- Verbalizes Understanding   Class Start Time 1150   Class Stop Time 1238   Class Time Calculation (min) 48 min    [place] Name 12/27/21 1200     Education   Cardiac Education Topics Pritikin   Financial trader   Weekly Topic Fast and Healthy Breakfasts   Instruction Review Code 1- Verbalizes Understanding   Class Start Time 1143   Class Stop Time 1223   Class Time Calculation (min) 40 min    [place] Name 01/01/22 1200     Education   Cardiac Education Topics Pritikin   [employer] Workshops     Workshops   Educator Exercise Physiologist   [facility] Exercise   Exercise Workshop Hotel manager and Fall Prevention   Instruction Review Code 1- Verbalizes Understanding   Class Start Time 1145   Class Stop Time 1229   Class Time Calculation (min) 44 min    [place] Name 01/03/22 1300     Education   Cardiac Education Topics [place]   Educator Dietitian   Weekly Topic Personalizing Your  Pritikin Plate   Instruction Review Code 1- Verbalizes Understanding   Class Start Time 1145   Class Stop Time 1216   Class Time Calculation (min) 31 min    [place] Name 01/15/22 1400     Education   Cardiac Education Topics Pritikin   Scientist, research (life sciences)   Educator Dietitian   Select Nutrition   Nutrition Calorie Density   Instruction Review Code 1- Verbalizes Understanding   Class Start Time 1140   Class Stop Time 1230   Class Time Calculation (min) 50 min    [place] Name 01/17/22 1500     Education   Cardiac Education Topics Pritikin   Financial trader   Weekly Topic [place]   Instruction Review Code  1- Verbalizes Understanding   Class Start Time 1130   Class Stop Time 1205   Class Time Calculation (min) 35 min    [place] Name 01/19/22 1200     Education   Cardiac Education Topics Pritikin   Environmental consultant Psychosocial   Psychosocial Workshop Managing Moods and Relationships   Instruction Review Code 1- Verbalizes Understanding   Class Start Time 1140   Class Stop Time 1230   Class Time Calculation (min) 50 min    [place] Name 01/22/22 1300     Education   Cardiac Education Topics [place]   Environmental consultant Exercise   Exercise Workshop Exercise Basics: Press photographer   Instruction Review Code 1- Verbalizes Understanding   Class Start Time 1140   Class Stop Time 1224   Class Time Calculation (min) 44 min            Core Videos: Exercise    Move It!  Clinical staff conducted group or individual video education with verbal and written material and guidebook.  Patient learns the recommended Pritikin exercise program. Exercise with the goal of living a long, healthy life. Some of the health benefits of exercise include controlled diabetes, healthier blood pressure  levels, improved cholesterol levels, improved heart and lung capacity, improved sleep, and better body composition. Everyone should speak with their doctor before starting or changing an exercise routine.  Biomechanical Limitations Clinical staff conducted group or individual video education with verbal and written material and guidebook.  Patient learns how biomechanical limitations can impact exercise and how we can mitigate and possibly overcome limitations to have an impactful and balanced exercise routine.  Body Composition Clinical staff conducted group or individual video education with verbal and written material and guidebook.  Patient learns that body composition (ratio of muscle mass to fat mass) is a key component to assessing overall fitness, rather than body weight alone. Increased fat mass, especially visceral belly fat, can put [place] at increased risk for metabolic syndrome, type 2 diabetes, heart disease, and even death. It is recommended to combine diet and exercise (cardiovascular and resistance training) to improve your body composition. Seek guidance from your physician and exercise physiologist before implementing an exercise routine.  Exercise Action Plan Clinical staff conducted group or individual video education with verbal and written material and guidebook.  Patient learns the recommended strategies to achieve and enjoy long-term exercise adherence, including variety, self-motivation, self-efficacy, and positive decision making. Benefits of exercise include fitness, good health, weight management, more energy, better sleep, less stress, and overall well-being.  Medical   Heart Disease Risk Reduction Clinical staff conducted group or individual video education with verbal and written material and guidebook.  Patient learns our heart is our most vital organ as it circulates oxygen, nutrients, white blood cells, and hormones throughout the entire body, and carries waste away.  Data supports a plant-based eating plan like the Pritikin Program for its effectiveness in slowing progression of and reversing heart disease. The video provides a number of recommendations to address heart disease.   Metabolic Syndrome and Belly Fat  Clinical staff conducted group or individual video education with verbal and written material and guidebook.  Patient learns what metabolic syndrome is, how it leads to heart disease, and how one can reverse it and keep it from coming back. You have metabolic syndrome  if you have 3 of the following 5 criteria: abdominal obesity, high blood pressure, high triglycerides, low HDL cholesterol, and high blood sugar.  Hypertension and Heart Disease Clinical staff conducted group or individual video education with verbal and written material and guidebook.  Patient learns that high blood pressure, or hypertension, is very common in the [place]. Hypertension is largely due to excessive salt intake, but other important risk factors include being overweight, physical inactivity, drinking too much alcohol, smoking, and not eating enough potassium from fruits and vegetables. High blood pressure is a leading risk factor for heart attack, stroke, congestive heart failure, dementia, kidney failure, and premature death. Long-term effects of excessive salt intake include stiffening of the arteries and thickening of heart muscle and organ damage. Recommendations include ways to reduce hypertension and the risk of heart disease.  Diseases of Our Time - Focusing on Diabetes Clinical staff conducted group or individual video education with verbal and written material and guidebook.  Patient learns why the best way to stop diseases of our time is prevention, through food and other lifestyle changes. Medicine (such as prescription pills and surgeries) is often only a Band-Aid on the problem, not a long-term solution. Most common diseases of our time include obesity, type 2  diabetes, hypertension, heart disease, and cancer. The Pritikin Program is recommended and has been proven to help reduce, reverse, and/or prevent the damaging effects of metabolic syndrome.  Nutrition   Overview of the Pritikin Eating Plan  Clinical staff conducted group or individual video education with verbal and written material and guidebook.  Patient learns about the [place] for disease risk reduction. The [place] emphasizes a wide variety of unrefined, minimally-processed carbohydrates, like fruits, vegetables, whole grains, and legumes. Go, Caution, and Stop food choices are explained. Plant-based and lean animal proteins are emphasized. Rationale provided for low sodium intake for blood pressure control, low added sugars for blood sugar stabilization, and low added fats and oils for coronary artery disease risk reduction and weight management.  Calorie Density  Clinical staff conducted group or individual video education with verbal and written material and guidebook.  Patient learns about calorie density and how it impacts the Pritikin Eating Plan. Knowing the characteristics of the food you choose will help you decide whether those foods will lead to weight gain or weight loss, and whether you want to consume more or less of them. Weight loss is usually a side effect of the Pritikin Eating Plan because of its focus on low calorie-dense foods.  Label Reading  Clinical staff conducted group or individual video education with verbal and written material and guidebook.  Patient learns about the Pritikin recommended label reading guidelines and corresponding recommendations regarding calorie density, added sugars, sodium content, and whole grains.  Dining Out - Part 1  Clinical staff conducted group or individual video education with verbal and written material and guidebook.  Patient learns that restaurant meals can be sabotaging because they can be so high in  calories, fat, sodium, and/or sugar. Patient learns recommended strategies on how to positively address this and avoid unhealthy pitfalls.  Facts on Fats  Clinical staff conducted group or individual video education with verbal and written material and guidebook.  Patient learns that lifestyle modifications can be just as effective, if not more so, as many medications for lowering your risk of heart disease. A Pritikin lifestyle can help to reduce your risk of inflammation and atherosclerosis (cholesterol build-up, or plaque, in the  artery walls). Lifestyle interventions such as dietary choices and physical activity address the cause of atherosclerosis. A review of the types of fats and their impact on blood cholesterol levels, along with dietary recommendations to reduce fat intake is also included.  Nutrition Action Plan  Clinical staff conducted group or individual video education with verbal and written material and guidebook.  Patient learns how to incorporate Pritikin recommendations into their lifestyle. Recommendations include planning and keeping personal health goals in mind as an important part of their success.  Healthy Mind-Set    Healthy Minds, Bodies, Hearts  Clinical staff conducted group or individual video education with verbal and written material and guidebook.  Patient learns how to identify when they are stressed. Video will discuss the impact of that stress, as well as the many benefits of stress management. Patient will also be introduced to stress management techniques. The way we think, act, and feel has an impact on our hearts.  How Our Thoughts Can Heal Our Hearts  Clinical staff conducted group or individual video education with verbal and written material and guidebook.  Patient learns that negative thoughts can cause depression and anxiety. This can result in negative lifestyle behavior and serious health problems. Cognitive behavioral therapy is an effective method to  help control our thoughts in order to change and improve our emotional outlook.  Additional Videos:  Exercise    Improving Performance  Clinical staff conducted group or individual video education with verbal and written material and guidebook.  Patient learns to use a non-linear approach by alternating intensity levels and lengths of time spent exercising to help burn more calories and lose more body fat. Cardiovascular exercise helps improve heart health, metabolism, hormonal balance, blood sugar control, and recovery from fatigue. Resistance training improves strength, endurance, balance, coordination, reaction time, metabolism, and muscle mass. Flexibility exercise improves circulation, posture, and balance. Seek guidance from your physician and exercise physiologist before implementing an exercise routine and learn your capabilities and proper form for all exercise.  Introduction to Yoga  Clinical staff conducted group or individual video education with verbal and written material and guidebook.  Patient learns about yoga, a discipline of the coming together of mind, breath, and body. The benefits of yoga include improved flexibility, improved range of motion, better posture and core strength, increased lung function, weight loss, and positive self-image. Yoga's heart health benefits include lowered blood pressure, healthier heart rate, decreased cholesterol and triglyceride levels, improved immune function, and reduced stress. Seek guidance from your physician and exercise physiologist before implementing an exercise routine and learn your capabilities and proper form for all exercise.  Medical   Aging: Enhancing Your Quality of Life  Clinical staff conducted group or individual video education with verbal and written material and guidebook.  Patient learns key strategies and recommendations to stay in good physical health and enhance quality of life, such as prevention strategies, having an  advocate, securing a [place], and keeping a list of medications and system for tracking them. It also discusses how to avoid risk for bone loss.  Biology of Weight Control  Clinical staff conducted group or individual video education with verbal and written material and guidebook.  Patient learns that weight gain occurs because we consume more calories than we burn (eating more, moving less). Even if your body weight is normal, you may have higher ratios of fat compared to muscle mass. Too much body fat puts you at increased risk for  cardiovascular disease, heart attack, stroke, type 2 diabetes, and obesity-related cancers. In addition to exercise, following the [place] can help reduce your risk.  Decoding Lab Results  Clinical staff conducted group or individual video education with verbal and written material and guidebook.  Patient learns that lab test reflects one measurement whose values change over time and are influenced by many factors, including medication, stress, sleep, exercise, food, hydration, pre-existing medical conditions, and more. It is recommended to use the knowledge from this video to become more involved with your lab results and evaluate your numbers to speak with your doctor.   Diseases of Our Time - Overview  Clinical staff conducted group or individual video education with verbal and written material and guidebook.  Patient learns that according to the CDC, 50% to 70% of chronic diseases (such as obesity, type 2 diabetes, elevated lipids, hypertension, and heart disease) are avoidable through lifestyle improvements including healthier food choices, listening to satiety cues, and increased physical activity.  Sleep Disorders Clinical staff conducted group or individual video education with verbal and written material and guidebook.  Patient learns how good quality and duration of sleep are important to overall health and  well-being. Patient also learns about sleep disorders and how they impact health along with recommendations to address them, including discussing with a physician.  Nutrition  Dining Out - Part 2 Clinical staff conducted group or individual video education with verbal and written material and guidebook.  Patient learns how to plan ahead and communicate in order to maximize their dining experience in a healthy and nutritious manner. Included are recommended food choices based on the type of restaurant the patient is visiting.   Fueling a Best boy conducted group or individual video education with verbal and written material and guidebook.  There is a strong connection between our food choices and our health. Diseases like obesity and type 2 diabetes are very prevalent and are in large-part due to lifestyle choices. The Pritikin Eating Plan provides plenty of food and hunger-curbing satisfaction. It is easy to follow, affordable, and helps reduce health risks.  Menu Workshop  Clinical staff conducted group or individual video education with verbal and written material and guidebook.  Patient learns that restaurant meals can sabotage health goals because they are often packed with calories, fat, sodium, and sugar. Recommendations include strategies to plan ahead and to communicate with the manager, chef, or server to help order a healthier meal.  Planning Your Eating Strategy  Clinical staff conducted group or individual video education with verbal and written material and guidebook.  Patient learns about the [place] and its benefit of reducing the risk of disease. The [place] does not focus on calories. Instead, it emphasizes high-quality, nutrient-rich foods. By knowing the characteristics of the foods, we choose, we can determine their calorie density and make informed decisions.  Targeting Your Nutrition Priorities  Clinical staff conducted group or  individual video education with verbal and written material and guidebook.  Patient learns that lifestyle habits have a tremendous impact on disease risk and progression. This video provides eating and physical activity recommendations based on your personal health goals, such as reducing LDL cholesterol, losing weight, preventing or controlling type 2 diabetes, and reducing high blood pressure.  Vitamins and Minerals  Clinical staff conducted group or individual video education with verbal and written material and guidebook.  Patient learns different ways to obtain key vitamins and minerals, including through a  recommended healthy diet. It is important to discuss all supplements you take with your doctor.   Healthy Mind-Set    Smoking Cessation  Clinical staff conducted group or individual video education with verbal and written material and guidebook.  Patient learns that cigarette smoking and tobacco addiction pose a serious health risk which affects millions of people. Stopping smoking will significantly reduce the risk of heart disease, lung disease, and many forms of cancer. Recommended strategies for quitting are covered, including working with your doctor to develop a successful plan.  Culinary   Becoming a Financial trader conducted group or individual video education with verbal and written material and guidebook.  Patient learns that cooking at home can be healthy, cost-effective, quick, and puts them in control. Keys to cooking healthy recipes will include looking at your recipe, assessing your equipment needs, planning ahead, making it simple, choosing cost-effective seasonal ingredients, and limiting the use of added fats, salts, and sugars.  Cooking - Breakfast and Snacks  Clinical staff conducted group or individual video education with verbal and written material and guidebook.  Patient learns how important breakfast is to satiety and nutrition through the entire  day. Recommendations include key foods to eat during breakfast to help stabilize blood sugar levels and to prevent overeating at meals later in the day. Planning ahead is also a key component.  Cooking - Human resources officer conducted group or individual video education with verbal and written material and guidebook.  Patient learns eating strategies to improve overall health, including an approach to cook more at home. Recommendations include thinking of animal protein as a side on your plate rather than center stage and focusing instead on lower calorie dense options like vegetables, fruits, whole grains, and plant-based proteins, such as beans. Making sauces in large quantities to freeze for later and leaving the skin on your vegetables are also recommended to maximize your experience.  Cooking - Healthy Salads and Dressing Clinical staff conducted group or individual video education with verbal and written material and guidebook.  Patient learns that vegetables, fruits, whole grains, and legumes are the foundations of the [place]. Recommendations include how to incorporate each of these in flavorful and healthy salads, and how to create homemade salad dressings. Proper handling of ingredients is also covered. Cooking - Soups and [employer] - Soups and Desserts Clinical staff conducted group or individual video education with verbal and written material and guidebook.  Patient learns that Pritikin soups and desserts make for easy, nutritious, and delicious snacks and meal components that are low in sodium, fat, sugar, and calorie density, while high in vitamins, minerals, and filling fiber. Recommendations include simple and healthy ideas for soups and desserts.   Overview     The Pritikin Solution Program Overview Clinical staff conducted group or individual video education with verbal and written material and guidebook.  Patient learns that the results of the  [place] Program have been documented in more than 100 articles published in peer-reviewed journals, and the benefits include reducing risk factors for (and, in some cases, even reversing) high cholesterol, high blood pressure, type 2 diabetes, obesity, and more! An overview of the three key pillars of the Pritikin Program will be covered: eating well, doing regular exercise, and having a healthy mind-set.  WORKSHOPS  Exercise: Exercise Basics: Building Your Action Plan Clinical staff led group instruction and group discussion with PowerPoint presentation and patient guidebook. To enhance the learning environment  the use of posters, models and videos may be added. At the conclusion of this workshop, patients will comprehend the difference between physical activity and exercise, as well as the benefits of incorporating both, into their routine. Patients will understand the FITT (Frequency, Intensity, Time, and Type) principle and how to use it to build an exercise action plan. In addition, safety concerns and other considerations for exercise and cardiac rehab will be addressed by the presenter. The purpose of this lesson is to promote a comprehensive and effective weekly exercise routine in order to improve patients' overall level of fitness.   Managing Heart Disease: Your Path to a Healthier Heart Clinical staff led group instruction and group discussion with PowerPoint presentation and patient guidebook. To enhance the learning environment the use of posters, models and videos may be added.At the conclusion of this workshop, patients will understand the anatomy and physiology of the heart. Additionally, they will understand how Pritikin's three pillars impact the risk factors, the progression, and the management of heart disease.  The purpose of this lesson is to provide a high-level overview of the heart, heart disease, and how the Pritikin lifestyle positively impacts risk factors.  Exercise  Biomechanics Clinical staff led group instruction and group discussion with PowerPoint presentation and patient guidebook. To enhance the learning environment the use of posters, models and videos may be added. Patients will learn how the structural parts of their bodies function and how these functions impact their daily activities, movement, and exercise. Patients will learn how to promote a neutral spine, learn how to manage pain, and identify ways to improve their physical movement in order to promote healthy living. The purpose of this lesson is to expose patients to common physical limitations that impact physical activity. Participants will learn practical ways to adapt and manage aches and pains, and to minimize their effect on regular exercise. Patients will learn how to maintain good posture while sitting, walking, and lifting.  Balance Training and Fall Prevention  Clinical staff led group instruction and group discussion with PowerPoint presentation and patient guidebook. To enhance the learning environment the use of posters, models and videos may be added. At the conclusion of this workshop, patients will understand the importance of their sensorimotor skills (vision, proprioception, and the vestibular system) in maintaining their ability to balance as they age. Patients will apply a variety of balancing exercises that are appropriate for their current level of function. Patients will understand the common causes for poor balance, possible solutions to these problems, and ways to modify their physical environment in order to minimize their fall risk. The purpose of this lesson is to teach patients about the importance of maintaining balance as they age and ways to minimize their risk of falling.  WORKSHOPS   Nutrition:  Fueling a Scientist, research (physical sciences) led group instruction and group discussion with PowerPoint presentation and patient guidebook. To enhance the learning  environment the use of posters, models and videos may be added. Patients will review the foundational principles of the [place] and understand what constitutes a serving size in each of the food groups. Patients will also learn Pritikin-friendly foods that are better choices when away from home and review make-ahead meal and snack options. Calorie density will be reviewed and applied to three nutrition priorities: weight maintenance, weight loss, and weight gain. The purpose of this lesson is to reinforce (in a group setting) the key concepts around what patients are recommended to eat and how to  apply these guidelines when away from home by planning and selecting Pritikin-friendly options. Patients will understand how calorie density may be adjusted for different weight management goals.  Mindful Eating  Clinical staff led group instruction and group discussion with PowerPoint presentation and patient guidebook. To enhance the learning environment the use of posters, models and videos may be added. Patients will briefly review the concepts of the [place] and the importance of low-calorie dense foods. The concept of mindful eating will be introduced as well as the importance of paying attention to internal hunger signals. Triggers for non-hunger eating and techniques for dealing with triggers will be explored. The purpose of this lesson is to provide patients with the opportunity to review the basic principles of the [place], discuss the value of eating mindfully and how to measure internal cues of hunger and fullness using the Hunger Scale. Patients will also discuss reasons for non-hunger eating and learn strategies to use for controlling emotional eating.  Targeting Your Nutrition Priorities Clinical staff led group instruction and group discussion with PowerPoint presentation and patient guidebook. To enhance the learning environment the use of posters, models and  videos may be added. Patients will learn how to determine their genetic susceptibility to disease by reviewing their family history. Patients will gain insight into the importance of diet as part of an overall healthy lifestyle in mitigating the impact of genetics and other environmental insults. The purpose of this lesson is to provide patients with the opportunity to assess their personal nutrition priorities by looking at their family history, their own health history and current risk factors. Patients will also be able to discuss ways of prioritizing and modifying the [place] for their highest risk areas  Menu  Clinical staff led group instruction and group discussion with PowerPoint presentation and patient guidebook. To enhance the learning environment the use of posters, models and videos may be added. Using menus brought in from [employer], or printed from [employer], patients will apply the [place] dining out guidelines that were presented in the [employer] video. Patients will also be able to practice these guidelines in a variety of provided scenarios. The purpose of this lesson is to provide patients with the opportunity to practice hands-on learning of the [place] with actual menus and practice scenarios.  Label Reading Clinical staff led group instruction and group discussion with PowerPoint presentation and patient guidebook. To enhance the learning environment the use of posters, models and videos may be added. Patients will review and discuss the Pritikin label reading guidelines presented in Pritikin's Label Reading Educational series video. Using fool labels brought in from local grocery stores and markets, patients will apply the label reading guidelines and determine if the packaged food meet the Pritikin guidelines. The purpose of this lesson is to provide patients with the opportunity to review, discuss, and practice  hands-on learning of the Pritikin Label Reading guidelines with actual packaged food labels. [place] Workshops are designed to teach patients ways to prepare quick, simple, and affordable recipes at home. The importance of nutrition's role in chronic disease risk reduction is reflected in its emphasis in the overall Pritikin program. By learning how to prepare essential core Pritikin Eating Plan recipes, patients will increase control over what they eat; be able to customize the flavor of foods without the use of added salt, sugar, or fat; and improve the quality of the food they  consume. By learning a set of core recipes which are easily assembled, quickly prepared, and affordable, patients are more likely to prepare more healthy foods at home. These workshops focus on convenient breakfasts, simple entres, side dishes, and desserts which can be prepared with minimal effort and are consistent with nutrition recommendations for cardiovascular risk reduction. Cooking [employer] are taught by a Engineer, materials (RD) who has been trained by the [employer]. The chef or RD has a clear understanding of the importance of minimizing - if not completely eliminating - added fat, sugar, and sodium in recipes. Throughout the series of [place] Workshop sessions, patients will learn about healthy ingredients and efficient methods of cooking to build confidence in their capability to prepare    Cooking School weekly topics:  Adding Flavor- Sodium-Free  Fast and Healthy Breakfasts  Powerhouse Plant-Based Proteins  Satisfying Salads and Dressings  Simple Sides and Sauces  International Cuisine-Spotlight on the [place] Zones  Delicious Desserts  Savory Soups  Efficiency Cooking - Meals in a Snap  Tasty Appetizers and Snacks  Comforting Weekend Breakfasts  One-Pot Wonders   Fast Evening Meals  Easy [place] (Psychosocial): New Thoughts, New Behaviors Clinical staff led group instruction and group discussion with PowerPoint presentation and patient guidebook. To enhance the learning environment the use of posters, models and videos may be added. Patients will learn and practice techniques for developing effective health and lifestyle goals. Patients will be able to effectively apply the goal setting process learned to develop at least one new personal goal.  The purpose of this lesson is to expose patients to a new skill set of behavior modification techniques such as techniques setting SMART goals, overcoming barriers, and achieving new thoughts and new behaviors.  Managing Moods and Relationships Clinical staff led group instruction and group discussion with PowerPoint presentation and patient guidebook. To enhance the learning environment the use of posters, models and videos may be added. Patients will learn how emotional and chronic stress factors can impact their health and relationships. They will learn healthy ways to manage their moods and utilize positive coping mechanisms. In addition, ICR patients will learn ways to improve communication skills. The purpose of this lesson is to expose patients to ways of understanding how one's mood and health are intimately connected. Developing a healthy outlook can help build positive relationships and connections with others. Patients will understand the importance of utilizing effective communication skills that include actively listening and being heard. They will learn and understand the importance of the "4 Cs" and especially Connections in fostering of a Healthy Mind-Set.  Healthy Sleep for a Healthy Heart Clinical staff led group instruction and group discussion with PowerPoint presentation and patient guidebook. To enhance the learning environment the use of posters, models and videos may be added. At the conclusion  of this workshop, patients will be able to demonstrate knowledge of the importance of sleep to overall health, well-being, and quality of life. They will understand the symptoms of, and treatments for, common sleep disorders. Patients will also be able to identify daytime and nighttime behaviors which impact sleep, and they will be able to apply these tools to help manage sleep-related challenges. The purpose of this lesson is to provide patients with a general overview of sleep and outline the importance of quality sleep. Patients will learn about a few of the most common sleep disorders. Patients will also  be introduced to the concept of "sleep hygiene," and discover ways to self-manage certain sleeping problems through simple daily behavior changes. Finally, the workshop will motivate patients by clarifying the links between quality sleep and their goals of heart-healthy living.   Recognizing and Reducing Stress Clinical staff led group instruction and group discussion with PowerPoint presentation and patient guidebook. To enhance the learning environment the use of posters, models and videos may be added. At the conclusion of this workshop, patients will be able to understand the types of stress reactions, differentiate between acute and chronic stress, and recognize the impact that chronic stress has on their health. They will also be able to apply different coping mechanisms, such as reframing negative self-talk. Patients will have the opportunity to practice a variety of stress management techniques, such as deep abdominal breathing, progressive muscle relaxation, and/or guided imagery.  The purpose of this lesson is to educate patients on the role of stress in their lives and to provide healthy techniques for coping with it.  Learning Barriers/Preferences:  Learning Barriers/Preferences - 12/14/21 1409       Learning Barriers/Preferences   Learning Barriers Exercise Concerns   Pt has balance  concerns and dizziness   Learning Preferences Group Instruction;Individual Instruction             Education Topics:  Knowledge Questionnaire Score:  Knowledge Questionnaire Score - 12/14/21 1408       Knowledge Questionnaire Score   Pre Score 19/24             Core Components/Risk Factors/Patient Goals at Admission:  Personal Goals and Risk Factors at Admission - 12/14/21 1411       Core Components/Risk Factors/Patient Goals on Admission    Weight Management Yes;Weight Loss    Intervention Weight Management: Develop a combined nutrition and exercise program designed to reach desired caloric intake, while maintaining appropriate intake of nutrient and fiber, sodium and fats, and appropriate energy expenditure required for the weight goal.;Weight Management: Provide education and appropriate resources to help participant work on and attain dietary goals.;Weight Management/Obesity: Establish reasonable short term and long term weight goals.;Obesity: Provide education and appropriate resources to help participant work on and attain dietary goals.    Admit Weight 252 lb 3.3 oz (114.4 kg)    Expected Outcomes Short Term: Continue to assess and modify interventions until short term weight is achieved;Long Term: Adherence to nutrition and physical activity/exercise program aimed toward attainment of established weight goal;Weight Loss: Understanding of general recommendations for a balanced deficit meal plan, which promotes 1-2 lb weight loss per week and includes a negative energy balance of [phone] kcal/d;Understanding recommendations for meals to include 15-35% energy as protein, 25-35% energy from fat, 35-60% energy from carbohydrates, less than '200mg'$  of dietary cholesterol, 20-35 gm of total fiber daily;Understanding of distribution of calorie intake throughout the day with the consumption of 4-5 meals/snacks    Diabetes Yes    Intervention Provide education about signs/symptoms and  action to take for hypo/hyperglycemia.;Provide education about proper nutrition, including hydration, and aerobic/resistive exercise prescription along with prescribed medications to achieve blood glucose in normal ranges: Fasting glucose 65-99 mg/dL    Expected Outcomes Short Term: Participant verbalizes understanding of the signs/symptoms and immediate care of hyper/hypoglycemia, proper foot care and importance of medication, aerobic/resistive exercise and nutrition plan for blood glucose control.;Long Term: Attainment of HbA1C < 7%.    Hypertension Yes    Intervention Provide education on lifestyle modifcations including regular physical activity/exercise, weight management,  moderate sodium restriction and increased consumption of fresh fruit, vegetables, and low fat dairy, alcohol moderation, and smoking cessation.;Monitor prescription use compliance.    Expected Outcomes Short Term: Continued assessment and intervention until BP is < 140/26m HG in hypertensive participants. < 130/834mHG in hypertensive participants with diabetes, heart failure or chronic kidney disease.;Long Term: Maintenance of blood pressure at goal levels.    Lipids Yes    Intervention Provide education and support for participant on nutrition & aerobic/resistive exercise along with prescribed medications to achieve LDL '70mg'$ , HDL >'40mg'$ .    Expected Outcomes Short Term: Participant states understanding of desired cholesterol values and is compliant with medications prescribed. Participant is following exercise prescription and nutrition guidelines.;Long Term: Cholesterol controlled with medications as prescribed, with individualized exercise RX and with personalized nutrition plan. Value goals: LDL < '70mg'$ , HDL > 40 mg.    Stress Yes    Intervention Offer individual and/or small group education and counseling on adjustment to heart disease, stress management and health-related lifestyle change. Teach and support self-help  strategies.;Refer participants experiencing significant psychosocial distress to appropriate mental health specialists for further evaluation and treatment. When possible, include family members and significant others in education/counseling sessions.    Expected Outcomes Short Term: Participant demonstrates changes in health-related behavior, relaxation and other stress management skills, ability to obtain effective social support, and compliance with psychotropic medications if prescribed.;Long Term: Emotional wellbeing is indicated by absence of clinically significant psychosocial distress or social isolation.    Personal Goal Other Yes    Personal Goal Short term: walk with less SOB Long term: gain strength    Intervention Will continue to monitor pt and progress workloads as tolerated without sign or symptom    Expected Outcomes Pt will achieve his goals and gain strength             Core Components/Risk Factors/Patient Goals Review:   Goals and Risk Factor Review     Row Name 12/18/21 1533 12/26/21 1422 01/23/22 1114         Core Components/Risk Factors/Patient Goals Review   Personal Goals Review Weight Management/Obesity;Stress;Hypertension;Diabetes;Lipids Weight Management/Obesity;Stress;Hypertension;Diabetes;Lipids Weight Management/Obesity;Stress;Hypertension;Diabetes;Lipids     Review patient started intensive cardiac rehab today, pt tolerated well, vss, denies pain. GrMarya Amsleras completed 2 session of intensive cardiac rehab and is off to a good start to exercise. Vital signs and CBG's have been stable GrMarya Amsleras been doing well with exercise at intensive cardiac rehav. Vital signs and CBG's have been stable. GrMarya Amsleras gained weight since starting intensive cardiac rehab. [facility] team has been notified. GrMarya Amslerays has has not been following a heaart healthy diet as he should. Will continue to monitor.     Expected Outcomes Patient will maintain a healthy lifestyle as he is  provided the knowledge and tools neccessary to complete goal, patient will increase exercise stamina during program. Patient will maintain a healthy lifestyle as he is provided the knowledge and tools neccessary to complete goal, patient will increase exercise stamina during program. Patient will maintain a healthy lifestyle as he is provided the knowledge and tools neccessary to complete goal, patient will increase exercise stamina during program.              Core Components/Risk Factors/Patient Goals at Discharge (Final Review):   Goals and Risk Factor Review - 01/23/22 1114       Core Components/Risk Factors/Patient Goals Review   Personal Goals Review Weight Management/Obesity;Stress;Hypertension;Diabetes;Lipids    Review GrMarya Amsleras been doing well  with exercise at intensive cardiac rehav. Vital signs and CBG's have been stable. Victor [name] has gained weight since starting intensive cardiac rehab. [facility] team has been notified. Victor [name] says has has not been following a heaart healthy diet as he should. Will continue to monitor.    Expected Outcomes Patient will maintain a healthy lifestyle as he is provided the knowledge and tools neccessary to complete goal, patient will increase exercise stamina during program.             ITP Comments:  ITP Comments     Row Name 12/14/21 1237 12/26/21 1418 01/23/22 1111       ITP Comments Dr Fransico Him MD, Medical Director. Introduction to [facility] Education Program/Intensvie cardiac Rehab. Initial [facility] Orientation Packet Reviewed with the Patient 30 Day ITP Review Victor [name] started intensive cardiac rehab on 12/14/21 and is off to a good start to exercise. 60 Day ITP Review Victor [name] has good attendance and participation intensive cardiac rehab since his return from [place] his mother in [place]              Comments: See ITP Comments

## 2022-01-24 ENCOUNTER — Encounter (HOSPITAL_COMMUNITY)
Admission: RE | Admit: 2022-01-24 | Discharge: 2022-01-24 | Disposition: A | Discharge status: Home / Self Care | Payer: Medicare Other | Source: Ambulatory Visit | Attending: Cardiology | Admitting: Cardiology

## 2022-01-24 DIAGNOSIS — Z941 Heart transplant status: Secondary | ICD-10-CM | POA: Diagnosis not present

## 2022-01-26 ENCOUNTER — Encounter (HOSPITAL_COMMUNITY)
Admission: RE | Admit: 2022-01-26 | Discharge: 2022-01-26 | Disposition: A | Discharge status: Home / Self Care | Payer: Medicare Other | Source: Ambulatory Visit | Attending: Cardiology | Admitting: Cardiology

## 2022-01-26 DIAGNOSIS — Z941 Heart transplant status: Secondary | ICD-10-CM

## 2022-01-29 ENCOUNTER — Encounter (HOSPITAL_COMMUNITY)
Admission: RE | Admit: 2022-01-29 | Discharge: 2022-01-29 | Disposition: A | Discharge status: Home / Self Care | Payer: Medicare Other | Source: Ambulatory Visit | Attending: Cardiology | Admitting: Cardiology

## 2022-01-29 DIAGNOSIS — Z941 Heart transplant status: Secondary | ICD-10-CM | POA: Diagnosis not present

## 2022-01-29 NOTE — Progress Notes (Signed)
Victor [name]'s weight is up 1.1 kg from Friday. Victor [name] admits to emotional eating and told Victor [name], RD that he has been experiecning some depression lately. Upon assessment lung fields clear. Oxygen saturation 97% on room air. No peripheral edema noted. Will notify the [facility] transplant team about weight gain. Victor [name] has gained 3.5 kg since starting cardiac rehab.Harrell Gave RN BSN

## 2022-01-31 ENCOUNTER — Encounter (HOSPITAL_COMMUNITY)
Admission: RE | Admit: 2022-01-31 | Discharge: 2022-01-31 | Disposition: A | Discharge status: Home / Self Care | Payer: Medicare Other | Source: Ambulatory Visit | Attending: Cardiology | Admitting: Cardiology

## 2022-01-31 DIAGNOSIS — Z941 Heart transplant status: Secondary | ICD-10-CM | POA: Diagnosis not present

## 2022-02-02 ENCOUNTER — Encounter (HOSPITAL_COMMUNITY)
Admission: RE | Admit: 2022-02-02 | Discharge: 2022-02-02 | Disposition: A | Discharge status: Home / Self Care | Payer: Medicare Other | Source: Ambulatory Visit | Attending: Cardiology | Admitting: Cardiology

## 2022-02-02 DIAGNOSIS — Z941 Heart transplant status: Secondary | ICD-10-CM

## 2022-02-05 ENCOUNTER — Encounter (HOSPITAL_COMMUNITY)
Admission: RE | Admit: 2022-02-05 | Discharge: 2022-02-05 | Disposition: A | Discharge status: Home / Self Care | Payer: Medicare Other | Source: Ambulatory Visit | Attending: Cardiology | Admitting: Cardiology

## 2022-02-05 DIAGNOSIS — Z79891 Long term (current) use of opiate analgesic: Secondary | ICD-10-CM | POA: Insufficient documentation

## 2022-02-05 DIAGNOSIS — Z5181 Encounter for therapeutic drug level monitoring: Secondary | ICD-10-CM | POA: Diagnosis not present

## 2022-02-05 DIAGNOSIS — G8929 Other chronic pain: Secondary | ICD-10-CM | POA: Insufficient documentation

## 2022-02-05 DIAGNOSIS — S8412XA Injury of peroneal nerve at lower leg level, left leg, initial encounter: Secondary | ICD-10-CM | POA: Insufficient documentation

## 2022-02-05 DIAGNOSIS — F418 Other specified anxiety disorders: Secondary | ICD-10-CM | POA: Diagnosis not present

## 2022-02-05 DIAGNOSIS — M25562 Pain in left knee: Secondary | ICD-10-CM | POA: Diagnosis not present

## 2022-02-05 DIAGNOSIS — Z4889 Encounter for other specified surgical aftercare: Secondary | ICD-10-CM | POA: Diagnosis not present

## 2022-02-05 DIAGNOSIS — Z941 Heart transplant status: Secondary | ICD-10-CM | POA: Insufficient documentation

## 2022-02-05 DIAGNOSIS — G894 Chronic pain syndrome: Secondary | ICD-10-CM | POA: Diagnosis not present

## 2022-02-07 ENCOUNTER — Encounter (HOSPITAL_COMMUNITY)
Admission: RE | Admit: 2022-02-07 | Discharge: 2022-02-07 | Disposition: A | Discharge status: Home / Self Care | Payer: Medicare Other | Source: Ambulatory Visit | Attending: Cardiology | Admitting: Cardiology

## 2022-02-07 DIAGNOSIS — Z941 Heart transplant status: Secondary | ICD-10-CM

## 2022-02-09 ENCOUNTER — Encounter (HOSPITAL_COMMUNITY)
Admission: RE | Admit: 2022-02-09 | Discharge: 2022-02-09 | Disposition: A | Discharge status: Home / Self Care | Payer: Medicare Other | Source: Ambulatory Visit | Attending: Cardiology | Admitting: Cardiology

## 2022-02-09 DIAGNOSIS — Z941 Heart transplant status: Secondary | ICD-10-CM

## 2022-02-12 ENCOUNTER — Encounter (HOSPITAL_COMMUNITY)
Admission: RE | Admit: 2022-02-12 | Discharge: 2022-02-12 | Disposition: A | Discharge status: Home / Self Care | Payer: Medicare Other | Source: Ambulatory Visit | Attending: Cardiology | Admitting: Cardiology

## 2022-02-12 DIAGNOSIS — Z941 Heart transplant status: Secondary | ICD-10-CM

## 2022-02-13 NOTE — Progress Notes (Signed)
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  Decreased Interest 0 0 0 0 0 0  Down, Depressed, Hopeless 0 0 0 0 0 1  PHQ - 2 Score 0 0 0 0 0 1   Interpretation of Total Score  Total Score Depression Severity:  1-4 = Minimal depression, 5-9 = Mild depression, 10-14 = Moderate depression, 15-19 = Moderately severe depression, 20-27 = Severe depression   Psychosocial Evaluation and Intervention:   Psychosocial Re-Evaluation:  Psychosocial Re-Evaluation     [place] Name 12/26/21 1419 01/23/22 1112 02/13/22  1523         Psychosocial Re-Evaluation  Current issues with History of Depression;Current Stress Concerns History of Depression;Current Stress Concerns History of Depression;Current Stress Concerns     Comments Victor [name] started intensive cardiac rehab on 12/14/21 and has completed 2 exercise sessions. No concerns have been voiced Victor [name] has not voiced any increased concerns or stressors at intensive cardiac rehab. 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 Victor [name] will be receiving counselling in the upcoming future via [facility].     Expected Outcomes Victor [name] will have controlled or decreased depression upon completion of intensive cardiac rehab Victor [name] will have controlled or decreased depression upon completion of intensive cardiac rehab Victor [name] will have controlled or decreased depression upon completion of intensive cardiac rehab     Interventions Encouraged to attend Cardiac Rehabilitation for the exercise;Relaxation education;Stress management education Encouraged to attend Cardiac Rehabilitation for the exercise;Relaxation education;Stress management education Encouraged to attend Cardiac Rehabilitation for the exercise;Relaxation education;Stress management education     Continue Psychosocial Services  Follow up required by staff Follow up required by staff Follow up required by staff       Initial Review   Source of Stress Concerns Chronic Illness Chronic Illness Chronic Illness     Comments Will continue to monitor and offer support as needed Will continue to monitor and offer support as needed Will continue to monitor and offer support as needed              Psychosocial Discharge (Final Psychosocial Re-Evaluation):  Psychosocial Re-Evaluation - 02/13/22 1523       Psychosocial Re-Evaluation   Current issues with History of Depression;Current Stress Concerns    Comments have been in contact with the Transplant Team at [facility] about [place] depression. Victor [name] will be  receiving counselling in the upcoming future via [facility].    Expected Outcomes Victor [name] will have controlled or decreased depression upon completion of intensive cardiac rehab    Interventions Encouraged to attend Cardiac Rehabilitation for the exercise;Relaxation education;Stress management education    Continue Psychosocial Services  Follow up required by staff      Initial Review   Source of Stress Concerns Chronic Illness    Comments Will continue to monitor and offer support as needed             Vocational Rehabilitation: Provide vocational rehab assistance to qualifying candidates.   Vocational Rehab Evaluation & Intervention:  Vocational Rehab - 12/14/21 1250       Initial Vocational Rehab Evaluation & Intervention   Assessment shows need for Vocational Rehabilitation No   Victor [name] is retired and does not need vocatinal rehab at [employer] time            Education: Education Goals: Education classes will be provided on a weekly basis, covering required topics. Participant will state understanding/return demonstration of topics presented.    Education     Row Name 12/18/21 1600     Education   Cardiac Education Topics Pritikin   IT sales professional Nutrition   Nutrition Workshop Fueling a Designer, multimedia   Instruction Review Code 1- Engineer, civil (consulting) Start Time 1150   Class Stop Time 1240   Class Time Calculation (min) 50 min    [place] Name 12/25/21 [place]   Environmental consultant Psychosocial   Psychosocial Workshop Recognizing  and Reducing Stress   Instruction Review Code 1- Verbalizes Understanding   Class Start Time 1150   Class Stop Time 1238   Class Time Calculation (min) 48 min    [place] Name 12/27/21 1200     Education   Cardiac Education Topics Pritikin   Surveyor, quantity   Weekly Topic Fast and Healthy Breakfasts   Instruction Review Code 1- Verbalizes Understanding   Class Start Time 1143   Class Stop Time 1223   Class Time Calculation (min) 40 min    [place] Name 01/01/22 1200     Education   Cardiac Education Topics Pritikin   Select Workshops     Workshops   Educator Exercise Physiologist   Select Exercise   Exercise Workshop Hotel manager and Fall Prevention   Instruction Review Code 1- Verbalizes Understanding   Class Start Time 1145   Class Stop Time 1229   Class Time Calculation (min) 44 min    [place] Name 01/03/22 1300     Education   Cardiac Education Topics [employer]   Educator Dietitian   Weekly Topic Personalizing Your Pritikin Plate   Instruction Review Code 1- Verbalizes Understanding   Class Start Time 1145   Class Stop Time 1216   Class Time Calculation (min) 31 min    [place] Name 01/15/22 1400     Education   Cardiac Education Topics Pritikin   Lexicographer Nutrition   Nutrition Calorie Density   Instruction Review Code 1- Verbalizes Understanding   Class Start Time 1140   Class Stop Time 1230   Class Time Calculation (min) 50 min    [place] Name 01/17/22 1500     Education   Cardiac Education Topics Pritikin   Financial trader   Weekly Topic [place]   Instruction Review Code 1- Verbalizes Understanding   Class Start Time 1130   Class Stop Time 1205   Class Time Calculation (min) 35 min    [place] Name 01/19/22 1200     Education   Cardiac Education Topics Pritikin   Environmental consultant Psychosocial   Psychosocial Workshop Managing Moods and Relationships   Instruction Review Code 1- Verbalizes Understanding   Class Start Time 1140   Class Stop Time 1230   Class Time Calculation (min)  50 min    [place] Name 01/22/22 [place]   Environmental consultant Exercise   Exercise Workshop Exercise Basics: Press photographer   Instruction Review Code 1- Verbalizes Understanding   Class Start Time 1140   Class Stop Time 1224   Class Time Calculation (min) 44 min    [place] Name 01/24/22 1400     Education   Cardiac Education Topics Pritikin   Financial trader   Weekly Topic Efficiency Cooking - Meals in a Snap   Instruction Review Code 1- Verbalizes Understanding   Class Start Time 1135   Class Stop Time 1210   Class Time Calculation (min) 35 min  [place] Name 01/26/22 1200     Education   Cardiac Education Topics Pritikin   Academic librarian Exercise Education   Exercise Education Move It!   Instruction Review Code 1- Verbalizes Understanding   Class Start Time 1138   Class Stop Time 1214   Class Time Calculation (min) 36 min    [place] Name 01/29/22 1500     Education   Cardiac Education Topics Pritikin   Scientist, research (life sciences)   Educator Dietitian   Select Nutrition   Nutrition Nutrition Action Plan   Instruction Review Code 1- Verbalizes Understanding   Class Start Time 1145   Class Stop Time 1224   Class Time Calculation (min) 39 min    [place] Name 01/31/22 1300     Education   Cardiac Education Topics Pritikin   Financial trader   Weekly Topic Simple Sides and Sauces   Instruction Review Code 1- Verbalizes Understanding   Class Start Time 1138   Class Stop Time 1210   Class Time Calculation (min) 32 min    [place] Name 02/02/22 1300     Education   Cardiac Education Topics Pritikin   Tax inspector General Education   General Education  Hypertension and Heart Disease   Instruction Review Code 1- Verbalizes Understanding   Class Start Time 1145   Class Stop Time 1227   Class Time Calculation (min) 42 min    [place] Name 02/05/22 1600     Education   Cardiac Education Topics Pritikin   Lexicographer Nutrition   Nutrition Dining Out - Part 1   Instruction Review Code 1- Verbalizes Understanding   Class Start Time 1137   Class Stop Time 1220   Class Time Calculation (min) 43 min    [place] Name 02/07/22 1300     Education   Cardiac Education Topics Pritikin   Financial trader   Weekly Topic One-Pot Wonders   Instruction Review Code 1- Verbalizes Understanding   Class Start Time 1142   Class Stop Time 1213   Class Time Calculation (min) 31 min    [place] Name 02/09/22 1200     Education   Cardiac Education Topics Pritikin   Environmental consultant Psychosocial   Psychosocial Workshop New Thoughts, New Behaviors   Instruction Review Code 1- Verbalizes Understanding   Class Start Time 1143   Class Stop Time 1225   Class Time Calculation (min) 42 min    [place] Name 02/12/22 1300     Education   Cardiac Education Topics Pritikin   Select Core Videos     Core Videos   Educator Exercise Physiologist   Select Exercise Education   Exercise Education Biomechanial Limitations   Instruction Review Code 1- Verbalizes Understanding   Class Start Time 1140   Class Stop Time 1216   Class Time Calculation (min) 36 min            Core Videos: Exercise    Move It!  Clinical staff conducted group or individual video education with verbal  and written material and guidebook.  Patient learns the recommended Pritikin exercise program. Exercise with the goal of living a long, healthy life. Some of the health benefits of exercise include controlled diabetes, healthier blood  pressure levels, improved cholesterol levels, improved heart and lung capacity, improved sleep, and better body composition. Everyone should speak with their doctor before starting or changing an exercise routine.  Biomechanical Limitations Clinical staff conducted group or individual video education with verbal and written material and guidebook.  Patient learns how biomechanical limitations can impact exercise and how we can mitigate and possibly overcome limitations to have an impactful and balanced exercise routine.  Body Composition Clinical staff conducted group or individual video education with verbal and written material and guidebook.  Patient learns that body composition (ratio of muscle mass to fat mass) is a key component to assessing overall fitness, rather than body weight alone. Increased fat mass, especially visceral belly fat, can put [place] at increased risk for metabolic syndrome, type 2 diabetes, heart disease, and even death. It is recommended to combine diet and exercise (cardiovascular and resistance training) to improve your body composition. Seek guidance from your physician and exercise physiologist before implementing an exercise routine.  Exercise Action Plan Clinical staff conducted group or individual video education with verbal and written material and guidebook.  Patient learns the recommended strategies to achieve and enjoy long-term exercise adherence, including variety, self-motivation, self-efficacy, and positive decision making. Benefits of exercise include fitness, good health, weight management, more energy, better sleep, less stress, and overall well-being.  Medical   Heart Disease Risk Reduction Clinical staff conducted group or individual video education with verbal and written material and guidebook.  Patient learns our heart is our most vital organ as it circulates oxygen, nutrients, white blood cells, and hormones throughout the entire body, and carries  waste away. Data supports a plant-based eating plan like the Pritikin Program for its effectiveness in slowing progression of and reversing heart disease. The video provides a number of recommendations to address heart disease.   Metabolic Syndrome and Belly Fat  Clinical staff conducted group or individual video education with verbal and written material and guidebook.  Patient learns what metabolic syndrome is, how it leads to heart disease, and how one can reverse it and keep it from coming back. You have metabolic syndrome if you have 3 of the following 5 criteria: abdominal obesity, high blood pressure, high triglycerides, low HDL cholesterol, and high blood sugar.  Hypertension and Heart Disease Clinical staff conducted group or individual video education with verbal and written material and guidebook.  Patient learns that high blood pressure, or hypertension, is very common in the [place]. Hypertension is largely due to excessive salt intake, but other important risk factors include being overweight, physical inactivity, drinking too much alcohol, smoking, and not eating enough potassium from fruits and vegetables. High blood pressure is a leading risk factor for heart attack, stroke, congestive heart failure, dementia, kidney failure, and premature death. Long-term effects of excessive salt intake include stiffening of the arteries and thickening of heart muscle and organ damage. Recommendations include ways to reduce hypertension and the risk of heart disease.  Diseases of Our Time - Focusing on Diabetes Clinical staff conducted group or individual video education with verbal and written material and guidebook.  Patient learns why the best way to stop diseases of our time is prevention, through food and other lifestyle changes. Medicine (such as prescription pills and surgeries) is often only a  Band-Aid on the problem, not a long-term solution. Most common diseases of our time include  obesity, type 2 diabetes, hypertension, heart disease, and cancer. The Pritikin Program is recommended and has been proven to help reduce, reverse, and/or prevent the damaging effects of metabolic syndrome.  Nutrition   Overview of the Pritikin Eating Plan  Clinical staff conducted group or individual video education with verbal and written material and guidebook.  Patient learns about the [place] for disease risk reduction. The [place] emphasizes a wide variety of unrefined, minimally-processed carbohydrates, like fruits, vegetables, whole grains, and legumes. Go, Caution, and Stop food choices are explained. Plant-based and lean animal proteins are emphasized. Rationale provided for low sodium intake for blood pressure control, low added sugars for blood sugar stabilization, and low added fats and oils for coronary artery disease risk reduction and weight management.  Calorie Density  Clinical staff conducted group or individual video education with verbal and written material and guidebook.  Patient learns about calorie density and how it impacts the Pritikin Eating Plan. Knowing the characteristics of the food you choose will help you decide whether those foods will lead to weight gain or weight loss, and whether you want to consume more or less of them. Weight loss is usually a side effect of the Pritikin Eating Plan because of its focus on low calorie-dense foods.  Label Reading  Clinical staff conducted group or individual video education with verbal and written material and guidebook.  Patient learns about the Pritikin recommended label reading guidelines and corresponding recommendations regarding calorie density, added sugars, sodium content, and whole grains.  Dining Out - Part 1  Clinical staff conducted group or individual video education with verbal and written material and guidebook.  Patient learns that restaurant meals can be sabotaging because they can be  so high in calories, fat, sodium, and/or sugar. Patient learns recommended strategies on how to positively address this and avoid unhealthy pitfalls.  Facts on Fats  Clinical staff conducted group or individual video education with verbal and written material and guidebook.  Patient learns that lifestyle modifications can be just as effective, if not more so, as many medications for lowering your risk of heart disease. A Pritikin lifestyle can help to reduce your risk of inflammation and atherosclerosis (cholesterol build-up, or plaque, in the artery walls). Lifestyle interventions such as dietary choices and physical activity address the cause of atherosclerosis. A review of the types of fats and their impact on blood cholesterol levels, along with dietary recommendations to reduce fat intake is also included.  Nutrition Action Plan  Clinical staff conducted group or individual video education with verbal and written material and guidebook.  Patient learns how to incorporate Pritikin recommendations into their lifestyle. Recommendations include planning and keeping personal health goals in mind as an important part of their success.  Healthy Mind-Set    Healthy Minds, Bodies, Hearts  Clinical staff conducted group or individual video education with verbal and written material and guidebook.  Patient learns how to identify when they are stressed. Video will discuss the impact of that stress, as well as the many benefits of stress management. Patient will also be introduced to stress management techniques. The way we think, act, and feel has an impact on our hearts.  How Our Thoughts Can Heal Our Hearts  Clinical staff conducted group or individual video education with verbal and written material and guidebook.  Patient learns that negative thoughts can cause depression and anxiety.  This can result in negative lifestyle behavior and serious health problems. Cognitive behavioral therapy is an  effective method to help control our thoughts in order to change and improve our emotional outlook.  Additional Videos:  Exercise    Improving Performance  Clinical staff conducted group or individual video education with verbal and written material and guidebook.  Patient learns to use a non-linear approach by alternating intensity levels and lengths of time spent exercising to help burn more calories and lose more body fat. Cardiovascular exercise helps improve heart health, metabolism, hormonal balance, blood sugar control, and recovery from fatigue. Resistance training improves strength, endurance, balance, coordination, reaction time, metabolism, and muscle mass. Flexibility exercise improves circulation, posture, and balance. Seek guidance from your physician and exercise physiologist before implementing an exercise routine and learn your capabilities and proper form for all exercise.  Introduction to Yoga  Clinical staff conducted group or individual video education with verbal and written material and guidebook.  Patient learns about yoga, a discipline of the coming together of mind, breath, and body. The benefits of yoga include improved flexibility, improved range of motion, better posture and core strength, increased lung function, weight loss, and positive self-image. Yoga's heart health benefits include lowered blood pressure, healthier heart rate, decreased cholesterol and triglyceride levels, improved immune function, and reduced stress. Seek guidance from your physician and exercise physiologist before implementing an exercise routine and learn your capabilities and proper form for all exercise.  Medical   Aging: Enhancing Your Quality of Life  Clinical staff conducted group or individual video education with verbal and written material and guidebook.  Patient learns key strategies and recommendations to stay in good physical health and enhance quality of life, such as prevention  strategies, having an advocate, securing a [place], and keeping a list of medications and system for tracking them. It also discusses how to avoid risk for bone loss.  Biology of Weight Control  Clinical staff conducted group or individual video education with verbal and written material and guidebook.  Patient learns that weight gain occurs because we consume more calories than we burn (eating more, moving less). Even if your body weight is normal, you may have higher ratios of fat compared to muscle mass. Too much body fat puts you at increased risk for cardiovascular disease, heart attack, stroke, type 2 diabetes, and obesity-related cancers. In addition to exercise, following the [place] can help reduce your risk.  Decoding Lab Results  Clinical staff conducted group or individual video education with verbal and written material and guidebook.  Patient learns that lab test reflects one measurement whose values change over time and are influenced by many factors, including medication, stress, sleep, exercise, food, hydration, pre-existing medical conditions, and more. It is recommended to use the knowledge from this video to become more involved with your lab results and evaluate your numbers to speak with your doctor.   Diseases of Our Time - Overview  Clinical staff conducted group or individual video education with verbal and written material and guidebook.  Patient learns that according to the CDC, 50% to 70% of chronic diseases (such as obesity, type 2 diabetes, elevated lipids, hypertension, and heart disease) are avoidable through lifestyle improvements including healthier food choices, listening to satiety cues, and increased physical activity.  Sleep Disorders Clinical staff conducted group or individual video education with verbal and written material and guidebook.  Patient learns how good quality and duration of sleep  are important to  overall health and well-being. Patient also learns about sleep disorders and how they impact health along with recommendations to address them, including discussing with a physician.  Nutrition  Dining Out - Part 2 Clinical staff conducted group or individual video education with verbal and written material and guidebook.  Patient learns how to plan ahead and communicate in order to maximize their dining experience in a healthy and nutritious manner. Included are recommended food choices based on the type of restaurant the patient is visiting.   Fueling a Best boy conducted group or individual video education with verbal and written material and guidebook.  There is a strong connection between our food choices and our health. Diseases like obesity and type 2 diabetes are very prevalent and are in large-part due to lifestyle choices. The Pritikin Eating Plan provides plenty of food and hunger-curbing satisfaction. It is easy to follow, affordable, and helps reduce health risks.  Menu Workshop  Clinical staff conducted group or individual video education with verbal and written material and guidebook.  Patient learns that restaurant meals can sabotage health goals because they are often packed with calories, fat, sodium, and sugar. Recommendations include strategies to plan ahead and to communicate with the manager, chef, or server to help order a healthier meal.  Planning Your Eating Strategy  Clinical staff conducted group or individual video education with verbal and written material and guidebook.  Patient learns about the [place] and its benefit of reducing the risk of disease. The [place] does not focus on calories. Instead, it emphasizes high-quality, nutrient-rich foods. By knowing the characteristics of the foods, we choose, we can determine their calorie density and make informed decisions.  Targeting Your Nutrition Priorities  Clinical staff  conducted group or individual video education with verbal and written material and guidebook.  Patient learns that lifestyle habits have a tremendous impact on disease risk and progression. This video provides eating and physical activity recommendations based on your personal health goals, such as reducing LDL cholesterol, losing weight, preventing or controlling type 2 diabetes, and reducing high blood pressure.  Vitamins and Minerals  Clinical staff conducted group or individual video education with verbal and written material and guidebook.  Patient learns different ways to obtain key vitamins and minerals, including through a recommended healthy diet. It is important to discuss all supplements you take with your doctor.   Healthy Mind-Set    Smoking Cessation  Clinical staff conducted group or individual video education with verbal and written material and guidebook.  Patient learns that cigarette smoking and tobacco addiction pose a serious health risk which affects millions of people. Stopping smoking will significantly reduce the risk of heart disease, lung disease, and many forms of cancer. Recommended strategies for quitting are covered, including working with your doctor to develop a successful plan.  Culinary   Becoming a Financial trader conducted group or individual video education with verbal and written material and guidebook.  Patient learns that cooking at home can be healthy, cost-effective, quick, and puts them in control. Keys to cooking healthy recipes will include looking at your recipe, assessing your equipment needs, planning ahead, making it simple, choosing cost-effective seasonal ingredients, and limiting the use of added fats, salts, and sugars.  Cooking - Breakfast and Snacks  Clinical staff conducted group or individual video education with verbal and written material and guidebook.  Patient learns how important breakfast is to satiety and  nutrition  through the entire day. Recommendations include key foods to eat during breakfast to help stabilize blood sugar levels and to prevent overeating at meals later in the day. Planning ahead is also a key component.  Cooking - Human resources officer conducted group or individual video education with verbal and written material and guidebook.  Patient learns eating strategies to improve overall health, including an approach to cook more at home. Recommendations include thinking of animal protein as a side on your plate rather than center stage and focusing instead on lower calorie dense options like vegetables, fruits, whole grains, and plant-based proteins, such as beans. Making sauces in large quantities to freeze for later and leaving the skin on your vegetables are also recommended to maximize your experience.  Cooking - Healthy Salads and Dressing Clinical staff conducted group or individual video education with verbal and written material and guidebook.  Patient learns that vegetables, fruits, whole grains, and legumes are the foundations of the [place]. Recommendations include how to incorporate each of these in flavorful and healthy salads, and how to create homemade salad dressings. Proper handling of ingredients is also covered. Cooking - Soups and [employer] - Soups and Desserts Clinical staff conducted group or individual video education with verbal and written material and guidebook.  Patient learns that Pritikin soups and desserts make for easy, nutritious, and delicious snacks and meal components that are low in sodium, fat, sugar, and calorie density, while high in vitamins, minerals, and filling fiber. Recommendations include simple and healthy ideas for soups and desserts.   Overview     The Pritikin Solution Program Overview Clinical staff conducted group or individual video education with verbal and written material and guidebook.  Patient learns that the  results of the [place] Program have been documented in more than 100 articles published in peer-reviewed journals, and the benefits include reducing risk factors for (and, in some cases, even reversing) high cholesterol, high blood pressure, type 2 diabetes, obesity, and more! An overview of the three key pillars of the Pritikin Program will be covered: eating well, doing regular exercise, and having a healthy mind-set.  WORKSHOPS  Exercise: Exercise Basics: Building Your Action Plan Clinical staff led group instruction and group discussion with PowerPoint presentation and patient guidebook. To enhance the learning environment the use of posters, models and videos may be added. At the conclusion of this workshop, patients will comprehend the difference between physical activity and exercise, as well as the benefits of incorporating both, into their routine. Patients will understand the FITT (Frequency, Intensity, Time, and Type) principle and how to use it to build an exercise action plan. In addition, safety concerns and other considerations for exercise and cardiac rehab will be addressed by the presenter. The purpose of this lesson is to promote a comprehensive and effective weekly exercise routine in order to improve patients' overall level of fitness.   Managing Heart Disease: Your Path to a Healthier Heart Clinical staff led group instruction and group discussion with PowerPoint presentation and patient guidebook. To enhance the learning environment the use of posters, models and videos may be added.At the conclusion of this workshop, patients will understand the anatomy and physiology of the heart. Additionally, they will understand how Pritikin's three pillars impact the risk factors, the progression, and the management of heart disease.  The purpose of this lesson is to provide a high-level overview of the heart, heart disease, and how the Pritikin lifestyle positively  impacts risk  factors.  Exercise Biomechanics Clinical staff led group instruction and group discussion with PowerPoint presentation and patient guidebook. To enhance the learning environment the use of posters, models and videos may be added. Patients will learn how the structural parts of their bodies function and how these functions impact their daily activities, movement, and exercise. Patients will learn how to promote a neutral spine, learn how to manage pain, and identify ways to improve their physical movement in order to promote healthy living. The purpose of this lesson is to expose patients to common physical limitations that impact physical activity. Participants will learn practical ways to adapt and manage aches and pains, and to minimize their effect on regular exercise. Patients will learn how to maintain good posture while sitting, walking, and lifting.  Balance Training and Fall Prevention  Clinical staff led group instruction and group discussion with PowerPoint presentation and patient guidebook. To enhance the learning environment the use of posters, models and videos may be added. At the conclusion of this workshop, patients will understand the importance of their sensorimotor skills (vision, proprioception, and the vestibular system) in maintaining their ability to balance as they age. Patients will apply a variety of balancing exercises that are appropriate for their current level of function. Patients will understand the common causes for poor balance, possible solutions to these problems, and ways to modify their physical environment in order to minimize their fall risk. The purpose of this lesson is to teach patients about the importance of maintaining balance as they age and ways to minimize their risk of falling.  WORKSHOPS   Nutrition:  Fueling a Scientist, research (physical sciences) led group instruction and group discussion with PowerPoint presentation and patient guidebook. To enhance  the learning environment the use of posters, models and videos may be added. Patients will review the foundational principles of the [place] and understand what constitutes a serving size in each of the food groups. Patients will also learn Pritikin-friendly foods that are better choices when away from home and review make-ahead meal and snack options. Calorie density will be reviewed and applied to three nutrition priorities: weight maintenance, weight loss, and weight gain. The purpose of this lesson is to reinforce (in a group setting) the key concepts around what patients are recommended to eat and how to apply these guidelines when away from home by planning and selecting Pritikin-friendly options. Patients will understand how calorie density may be adjusted for different weight management goals.  Mindful Eating  Clinical staff led group instruction and group discussion with PowerPoint presentation and patient guidebook. To enhance the learning environment the use of posters, models and videos may be added. Patients will briefly review the concepts of the [place] and the importance of low-calorie dense foods. The concept of mindful eating will be introduced as well as the importance of paying attention to internal hunger signals. Triggers for non-hunger eating and techniques for dealing with triggers will be explored. The purpose of this lesson is to provide patients with the opportunity to review the basic principles of the [place], discuss the value of eating mindfully and how to measure internal cues of hunger and fullness using the Hunger Scale. Patients will also discuss reasons for non-hunger eating and learn strategies to use for controlling emotional eating.  Targeting Your Nutrition Priorities Clinical staff led group instruction and group discussion with PowerPoint presentation and patient guidebook. To enhance the learning environment the use of posters,  models and videos may be added. Patients will learn how to determine their genetic susceptibility to disease by reviewing their family history. Patients will gain insight into the importance of diet as part of an overall healthy lifestyle in mitigating the impact of genetics and other environmental insults. The purpose of this lesson is to provide patients with the opportunity to assess their personal nutrition priorities by looking at their family history, their own health history and current risk factors. Patients will also be able to discuss ways of prioritizing and modifying the [place] for their highest risk areas  Menu  Clinical staff led group instruction and group discussion with PowerPoint presentation and patient guidebook. To enhance the learning environment the use of posters, models and videos may be added. Using menus brought in from [employer], or printed from [employer], patients will apply the [place] dining out guidelines that were presented in the [employer] video. Patients will also be able to practice these guidelines in a variety of provided scenarios. The purpose of this lesson is to provide patients with the opportunity to practice hands-on learning of the [place] with actual menus and practice scenarios.  Label Reading Clinical staff led group instruction and group discussion with PowerPoint presentation and patient guidebook. To enhance the learning environment the use of posters, models and videos may be added. Patients will review and discuss the Pritikin label reading guidelines presented in Pritikin's Label Reading Educational series video. Using fool labels brought in from local grocery stores and markets, patients will apply the label reading guidelines and determine if the packaged food meet the Pritikin guidelines. The purpose of this lesson is to provide patients with the opportunity to review, discuss, and  practice hands-on learning of the Pritikin Label Reading guidelines with actual packaged food labels. [place] Workshops are designed to teach patients ways to prepare quick, simple, and affordable recipes at home. The importance of nutrition's role in chronic disease risk reduction is reflected in its emphasis in the overall Pritikin program. By learning how to prepare essential core Pritikin Eating Plan recipes, patients will increase control over what they eat; be able to customize the flavor of foods without the use of added salt, sugar, or fat; and improve the quality of the food they consume. By learning a set of core recipes which are easily assembled, quickly prepared, and affordable, patients are more likely to prepare more healthy foods at home. These workshops focus on convenient breakfasts, simple entres, side dishes, and desserts which can be prepared with minimal effort and are consistent with nutrition recommendations for cardiovascular risk reduction. Cooking [employer] are taught by a Engineer, materials (RD) who has been trained by the [employer]. The chef or RD has a clear understanding of the importance of minimizing - if not completely eliminating - added fat, sugar, and sodium in recipes. Throughout the series of [place] Workshop sessions, patients will learn about healthy ingredients and efficient methods of cooking to build confidence in their capability to prepare    Cooking School weekly topics:  Adding Flavor- Sodium-Free  Fast and Healthy Breakfasts  Powerhouse Plant-Based Proteins  Satisfying Salads and Dressings  Simple Sides and Sauces  International Cuisine-Spotlight on the [place] Zones  Delicious Desserts  Savory Soups  [employer] - Meals in a Agricultural consultant Appetizers and Snacks  Comforting Weekend Breakfasts  One-Pot Wonders   Fast [employer]  Easy Entertaining  Personalizing Your  Pritikin Plate  WORKSHOPS   Healthy Mindset (Psychosocial): New Thoughts, New Behaviors Clinical staff led group instruction and group discussion with PowerPoint presentation and patient guidebook. To enhance the learning environment the use of posters, models and videos may be added. Patients will learn and practice techniques for developing effective health and lifestyle goals. Patients will be able to effectively apply the goal setting process learned to develop at least one new personal goal.  The purpose of this lesson is to expose patients to a new skill set of behavior modification techniques such as techniques setting SMART goals, overcoming barriers, and achieving new thoughts and new behaviors.  Managing Moods and Relationships Clinical staff led group instruction and group discussion with PowerPoint presentation and patient guidebook. To enhance the learning environment the use of posters, models and videos may be added. Patients will learn how emotional and chronic stress factors can impact their health and relationships. They will learn healthy ways to manage their moods and utilize positive coping mechanisms. In addition, ICR patients will learn ways to improve communication skills. The purpose of this lesson is to expose patients to ways of understanding how one's mood and health are intimately connected. Developing a healthy outlook can help build positive relationships and connections with others. Patients will understand the importance of utilizing effective communication skills that include actively listening and being heard. They will learn and understand the importance of the "4 Cs" and especially Connections in fostering of a Healthy Mind-Set.  Healthy Sleep for a Healthy Heart Clinical staff led group instruction and group discussion with PowerPoint presentation and patient guidebook. To enhance the learning environment the use of posters, models and videos may be added. At the  conclusion of this workshop, patients will be able to demonstrate knowledge of the importance of sleep to overall health, well-being, and quality of life. They will understand the symptoms of, and treatments for, common sleep disorders. Patients will also be able to identify daytime and nighttime behaviors which impact sleep, and they will be able to apply these tools to help manage sleep-related challenges. The purpose of this lesson is to provide patients with a general overview of sleep and outline the importance of quality sleep. Patients will learn about a few of the most common sleep disorders. Patients will also be introduced to the concept of "sleep hygiene," and discover ways to self-manage certain sleeping problems through simple daily behavior changes. Finally, the workshop will motivate patients by clarifying the links between quality sleep and their goals of heart-healthy living.   Recognizing and Reducing Stress Clinical staff led group instruction and group discussion with PowerPoint presentation and patient guidebook. To enhance the learning environment the use of posters, models and videos may be added. At the conclusion of this workshop, patients will be able to understand the types of stress reactions, differentiate between acute and chronic stress, and recognize the impact that chronic stress has on their health. They will also be able to apply different coping mechanisms, such as reframing negative self-talk. Patients will have the opportunity to practice a variety of stress management techniques, such as deep abdominal breathing, progressive muscle relaxation, and/or guided imagery.  The purpose of this lesson is to educate patients on the role of stress in their lives and to provide healthy techniques for coping with it.  Learning Barriers/Preferences:  Learning Barriers/Preferences - 12/14/21 1409       Learning Barriers/Preferences   Learning Barriers Exercise Concerns  Pt has  balance concerns and dizziness   Learning Preferences Group Instruction;Individual Instruction             Education Topics:  Knowledge Questionnaire Score:  Knowledge Questionnaire Score - 12/14/21 1408       Knowledge Questionnaire Score   Pre Score 19/24             Core Components/Risk Factors/Patient Goals at Admission:  Personal Goals and Risk Factors at Admission - 12/14/21 1411       Core Components/Risk Factors/Patient Goals on Admission    Weight Management Yes;Weight Loss    Intervention Weight Management: Develop a combined nutrition and exercise program designed to reach desired caloric intake, while maintaining appropriate intake of nutrient and fiber, sodium and fats, and appropriate energy expenditure required for the weight goal.;Weight Management: Provide education and appropriate resources to help participant work on and attain dietary goals.;Weight Management/Obesity: Establish reasonable short term and long term weight goals.;Obesity: Provide education and appropriate resources to help participant work on and attain dietary goals.    Admit Weight 252 lb 3.3 oz (114.4 kg)    Expected Outcomes Short Term: Continue to assess and modify interventions until short term weight is achieved;Long Term: Adherence to nutrition and physical activity/exercise program aimed toward attainment of established weight goal;Weight Loss: Understanding of general recommendations for a balanced deficit meal plan, which promotes 1-2 lb weight loss per week and includes a negative energy balance of [phone] kcal/d;Understanding recommendations for meals to include 15-35% energy as protein, 25-35% energy from fat, 35-60% energy from carbohydrates, less than '200mg'$  of dietary cholesterol, 20-35 gm of total fiber daily;Understanding of distribution of calorie intake throughout the day with the consumption of 4-5 meals/snacks    Diabetes Yes    Intervention Provide education about  signs/symptoms and action to take for hypo/hyperglycemia.;Provide education about proper nutrition, including hydration, and aerobic/resistive exercise prescription along with prescribed medications to achieve blood glucose in normal ranges: Fasting glucose 65-99 mg/dL    Expected Outcomes Short Term: Participant verbalizes understanding of the signs/symptoms and immediate care of hyper/hypoglycemia, proper foot care and importance of medication, aerobic/resistive exercise and nutrition plan for blood glucose control.;Long Term: Attainment of HbA1C < 7%.    Hypertension Yes    Intervention Provide education on lifestyle modifcations including regular physical activity/exercise, weight management, moderate sodium restriction and increased consumption of fresh fruit, vegetables, and low fat dairy, alcohol moderation, and smoking cessation.;Monitor prescription use compliance.    Expected Outcomes Short Term: Continued assessment and intervention until BP is < 140/79m HG in hypertensive participants. < 130/852mHG in hypertensive participants with diabetes, heart failure or chronic kidney disease.;Long Term: Maintenance of blood pressure at goal levels.    Lipids Yes    Intervention Provide education and support for participant on nutrition & aerobic/resistive exercise along with prescribed medications to achieve LDL '70mg'$ , HDL >'40mg'$ .    Expected Outcomes Short Term: Participant states understanding of desired cholesterol values and is compliant with medications prescribed. Participant is following exercise prescription and nutrition guidelines.;Long Term: Cholesterol controlled with medications as prescribed, with individualized exercise RX and with personalized nutrition plan. Value goals: LDL < '70mg'$ , HDL > 40 mg.    Stress Yes    Intervention Offer individual and/or small group education and counseling on adjustment to heart disease, stress management and health-related lifestyle change. Teach and support  self-help strategies.;Refer participants experiencing significant psychosocial distress to appropriate mental health specialists for further evaluation and treatment. When possible, include family  members and significant others in education/counseling sessions.    Expected Outcomes Short Term: Participant demonstrates changes in health-related behavior, relaxation and other stress management skills, ability to obtain effective social support, and compliance with psychotropic medications if prescribed.;Long Term: Emotional wellbeing is indicated by absence of clinically significant psychosocial distress or social isolation.    Personal Goal Other Yes    Personal Goal Short term: walk with less SOB Long term: gain strength    Intervention Will continue to monitor pt and progress workloads as tolerated without sign or symptom    Expected Outcomes Pt will achieve his goals and gain strength             Core Components/Risk Factors/Patient Goals Review:   Goals and Risk Factor Review     Row Name 12/18/21 1533 12/26/21 1422 01/23/22 1114 02/13/22 1526       Core Components/Risk Factors/Patient Goals Review   Personal Goals Review Weight Management/Obesity;Stress;Hypertension;Diabetes;Lipids Weight Management/Obesity;Stress;Hypertension;Diabetes;Lipids Weight Management/Obesity;Stress;Hypertension;Diabetes;Lipids Weight Management/Obesity;Stress;Hypertension;Diabetes;Lipids    Review patient started intensive cardiac rehab today, pt tolerated well, vss, denies pain. Victor [name] has completed 2 session of intensive cardiac rehab and is off to a good start to exercise. Vital signs and CBG's have been stable Victor [name] has been doing well with exercise at intensive cardiac rehav. Vital signs and CBG's have been stable. Victor [name] has gained weight since starting intensive cardiac rehab. [facility] team has been notified. Victor [name] says has has not been following a heaart healthy diet as he should. Will continue to monitor.  Victor [name] has been doing well with exercise at intensive cardiac rehav. Vital signs and CBG's have been stable. Victor [name] has gained weight since starting intensive cardiac rehab. [facility] team has been notified. Victor [name] has gained 4.2 kg since starting the program. Victor [name] will compete cardiac rehab on 02/16/22.    Expected Outcomes Patient will maintain a healthy lifestyle as he is provided the knowledge and tools neccessary to complete goal, patient will increase exercise stamina during program. Patient will maintain a healthy lifestyle as he is provided the knowledge and tools neccessary to complete goal, patient will increase exercise stamina during program. Patient will maintain a healthy lifestyle as he is provided the knowledge and tools neccessary to complete goal, patient will increase exercise stamina during program. Patient will maintain a healthy lifestyle as he is provided the knowledge and tools neccessary to complete goal, patient will increase exercise stamina during program.             Core Components/Risk Factors/Patient Goals at Discharge (Final Review):   Goals and Risk Factor Review - 02/13/22 1526       Core Components/Risk Factors/Patient Goals Review   Personal Goals Review Weight Management/Obesity;Stress;Hypertension;Diabetes;Lipids    Review Victor [name] has been doing well with exercise at intensive cardiac rehav. Vital signs and CBG's have been stable. Victor [name] has gained weight since starting intensive cardiac rehab. [facility] team has been notified. Victor [name] has gained 4.2 kg since starting the program. Victor [name] will compete cardiac rehab on 02/16/22.    Expected Outcomes Patient will maintain a healthy lifestyle as he is provided the knowledge and tools neccessary to complete goal, patient will increase exercise stamina during program.             ITP Comments:  ITP Comments     Row Name 12/14/21 1237 12/26/21 1418 01/23/22 1111 02/13/22 1522     ITP Comments Dr Fransico Him  MD, Medical Director. Introduction to [facility] Education Program/Intensvie cardiac Rehab. Initial [facility]  Orientation Packet Reviewed with the Patient 30 Day ITP Review Victor [name] started intensive cardiac rehab on 12/14/21 and is off to a good start to exercise. 61 Day ITP Review Victor [name] has good attendance and participation intensive cardiac rehab since his return from [place] his mother in [place] 68 Day [place] has good attendance and participation intensive cardiac rehab. Victor [name] will complete intensive cardiac rehab on 02/16/22             Comments: See ITP comments.Harrell Gave RN BSN

## 2022-02-14 ENCOUNTER — Encounter (HOSPITAL_COMMUNITY): Payer: Medicare Other

## 2022-02-16 ENCOUNTER — Encounter (HOSPITAL_COMMUNITY)
Admission: RE | Admit: 2022-02-16 | Discharge: 2022-02-16 | Disposition: A | Discharge status: Home / Self Care | Payer: Medicare Other | Source: Ambulatory Visit | Attending: Cardiology | Admitting: Cardiology

## 2022-02-16 ENCOUNTER — Encounter (HOSPITAL_BASED_OUTPATIENT_CLINIC_OR_DEPARTMENT_OTHER): Payer: Medicare Other | Admitting: Registered Nurse

## 2022-02-16 ENCOUNTER — Encounter: Payer: Self-pay | Admitting: Registered Nurse

## 2022-02-16 VITALS — BP 130/91 | HR 78 | Ht 72.0 in | Wt 265.0 lb

## 2022-02-16 DIAGNOSIS — F418 Other specified anxiety disorders: Secondary | ICD-10-CM | POA: Insufficient documentation

## 2022-02-16 DIAGNOSIS — Z79891 Long term (current) use of opiate analgesic: Secondary | ICD-10-CM | POA: Insufficient documentation

## 2022-02-16 DIAGNOSIS — S8412XS Injury of peroneal nerve at lower leg level, left leg, sequela: Secondary | ICD-10-CM | POA: Diagnosis not present

## 2022-02-16 DIAGNOSIS — G894 Chronic pain syndrome: Secondary | ICD-10-CM | POA: Insufficient documentation

## 2022-02-16 DIAGNOSIS — M25562 Pain in left knee: Secondary | ICD-10-CM | POA: Insufficient documentation

## 2022-02-16 DIAGNOSIS — Z5181 Encounter for therapeutic drug level monitoring: Secondary | ICD-10-CM | POA: Insufficient documentation

## 2022-02-16 DIAGNOSIS — G8929 Other chronic pain: Secondary | ICD-10-CM | POA: Insufficient documentation

## 2022-02-16 DIAGNOSIS — Z941 Heart transplant status: Secondary | ICD-10-CM | POA: Diagnosis not present

## 2022-02-16 MED ORDER — ALPRAZOLAM 0.5 MG PO TABS: 0.5000 mg | ORAL_TABLET | Freq: Two times a day (BID) | ORAL | 1 refills | Status: DC | PRN

## 2022-02-16 MED ORDER — OXYCODONE HCL 15 MG PO TABS: 15.0000 mg | ORAL_TABLET | Freq: Three times a day (TID) | ORAL | 0 refills | Status: DC | PRN

## 2022-02-16 MED ORDER — PREGABALIN 150 MG PO CAPS: 150.0000 mg | ORAL_CAPSULE | Freq: Three times a day (TID) | ORAL | 3 refills | Status: DC
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
## 2022-02-19 ENCOUNTER — Encounter (HOSPITAL_COMMUNITY)
Admission: RE | Admit: 2022-02-19 | Discharge: 2022-02-19 | Disposition: A | Discharge status: Home / Self Care | Payer: Medicare Other | Source: Ambulatory Visit | Attending: Cardiology | Admitting: Cardiology

## 2022-02-19 DIAGNOSIS — Z941 Heart transplant status: Secondary | ICD-10-CM | POA: Diagnosis not present

## 2022-02-19 NOTE — Progress Notes (Signed)
Discharge Progress Report  Patient Details  Name: Henrry Feil MRN: [id number] Date of Birth: Jul 08, 1960 Referring Provider:   Flowsheet Row INTENSIVE CARDIAC REHAB ORIENT from 12/14/2021 in [place]  Referring Provider Dr. Melvyn Novas MD (Dr. Fransico Him MD covering)        Number of Visits: 23 exercise and 23 education session  Reason for Discharge:  Patient reached a stable level of exercise. Patient independent in their exercise. Patient has met program and personal goals.  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 Melvyn Novas MD (Dr. Fransico Him MD covering)    Expected Discharge Date 02/16/22      NuStep   Level 1    SPM 80    Minutes 15    METs 2      Arm Ergometer   Level 1.5    Minutes 15    METs 1.8      Prescription Details   Frequency (times per week) 3    Duration Progress to 30 minutes of continuous aerobic without signs/symptoms of physical distress      Intensity   THRR 40-80% of Max Heartrate 64-128    Ratings of Perceived Exertion 11-13    Perceived Dyspnea 0-4      Progression   Progression Continue progressive overload as per policy without signs/symptoms or physical distress.      Resistance Training   Training Prescription Yes    Weight 4    Reps 10-15             Discharge Exercise Prescription (Final Exercise Prescription Changes):  Exercise Prescription Changes - 02/23/22 1023       Response to Exercise   Blood Pressure (Admit) 124/86    Blood Pressure (Exercise) 122/84    Blood Pressure (Exit) 102/84    Heart Rate (Admit) 85 bpm    Heart Rate (Exercise)  101 bpm    Heart Rate (Exit) 85 bpm    Rating of Perceived Exertion (Exercise) 10    Symptoms None    Comments Patient completed the cardiac rehab program today.    Duration Continue with 30 min of aerobic exercise without signs/symptoms of physical distress.    Intensity THRR unchanged      Progression   Progression Continue to progress workloads to maintain intensity without signs/symptoms of physical distress.    Average METs 2.5      Resistance Training   Training Prescription Yes    Weight 6 lbs    Reps 10-15    Time 10 Minutes      Interval Training   Interval Training No      NuStep   Level 4    SPM 107    Minutes 15    METs 2.4      Arm Ergometer   Level 5    Minutes 15    METs 2.7      Home Exercise Plan   Plans to continue exercise at [facility] (comment)   [facility].   Frequency Add 1 additional day to program exercise  sessions.    Initial Home Exercises Provided 01/17/22             Functional Capacity:  6 Minute Walk     Row Name 12/14/21 1354 02/12/22 1040       6 Minute Walk   Phase Initial Discharge    Distance 1309 feet 1501 feet    Distance % Change -- 14.67 %    Distance Feet Change -- 192 ft    Walk Time 6 minutes 6 minutes    # of Rest Breaks 0 0    MPH 2.48 2.84    METS 3.06 3.33    RPE 11 13    Perceived Dyspnea  0 1    VO2 Peak 10.7 11.67    Symptoms Yes (comment) Yes (comment)    Comments Pre orthostatics sittting 110/80 standing 110/80. Chronic left leg pain 6/10 at rest, during 6MWT and post. Mild SOB, RPD=1.    Resting HR 77 bpm 85 bpm    Resting BP 110/80 120/82    Resting Oxygen Saturation  97 % --    Exercise Oxygen Saturation  during 6 min walk 97 % 97 %    Max Ex. HR 98 bpm 97 bpm    Max Ex. BP 132/82 134/86    2 Minute Post BP 120/68 116/84             Psychological, QOL, Others - Outcomes: PHQ 2/9:    02/19/2022   10:13 AM 02/16/2022    9:09 AM  12/21/2021    8:31 AM 12/14/2021    5:01 PM 12/14/2021   12:51 PM  Depression screen PHQ 2/9  Decreased Interest 0 0 0 0 0  Down, Depressed, Hopeless 0 0 0 0 0  PHQ - 2 Score 0 0 0 0 0    Quality of Life:  Quality of Life - 02/14/22 0731       Quality of Life   Select Quality of Life      Quality of Life Scores   Health/Function Pre 25.1 %    Health/Function Post 22.18 %    Health/Function % Change -11.63 %    Socioeconomic Pre 24 %    Socioeconomic Post 20.5 %    Socioeconomic % Change  -14.58 %    Psych/Spiritual Pre 24 %    Psych/Spiritual Post 20.86 %    Psych/Spiritual % Change -13.08 %    Family Pre 27.6 %    Family Post 24.9 %    Family % Change -9.78 %    GLOBAL Pre 25.01 %    GLOBAL Post 21.95 %    GLOBAL % Change -12.24 %             Personal Goals: Goals established at orientation with interventions provided to work toward goal.  Personal Goals and Risk Factors at Admission - 12/14/21 1411       Core Components/Risk Factors/Patient Goals on Admission    Weight Management Yes;Weight Loss    Intervention Weight Management: Develop a combined nutrition and exercise program designed to reach desired caloric intake, while maintaining appropriate intake of nutrient and fiber, sodium and fats, and appropriate energy expenditure required for the weight goal.;Weight Management: Provide education and appropriate resources to help participant work on and attain dietary goals.;Weight Management/Obesity: Establish reasonable short term and long term weight goals.;Obesity: Provide education and appropriate resources to help participant work on and attain dietary goals.    Admit Weight 252 lb 3.3 oz (  114.4 kg)    Expected Outcomes Short Term: Continue to assess and modify interventions until short term weight is achieved;Long Term: Adherence to nutrition and physical activity/exercise program aimed toward attainment of established weight goal;Weight Loss: Understanding of  general recommendations for a balanced deficit meal plan, which promotes 1-2 lb weight loss per week and includes a negative energy balance of [phone] kcal/d;Understanding recommendations for meals to include 15-35% energy as protein, 25-35% energy from fat, 35-60% energy from carbohydrates, less than 210m of dietary cholesterol, 20-35 gm of total fiber daily;Understanding of distribution of calorie intake throughout the day with the consumption of 4-5 meals/snacks    Diabetes Yes    Intervention Provide education about signs/symptoms and action to take for hypo/hyperglycemia.;Provide education about proper nutrition, including hydration, and aerobic/resistive exercise prescription along with prescribed medications to achieve blood glucose in normal ranges: Fasting glucose 65-99 mg/dL    Expected Outcomes Short Term: Participant verbalizes understanding of the signs/symptoms and immediate care of hyper/hypoglycemia, proper foot care and importance of medication, aerobic/resistive exercise and nutrition plan for blood glucose control.;Long Term: Attainment of HbA1C < 7%.    Hypertension Yes    Intervention Provide education on lifestyle modifcations including regular physical activity/exercise, weight management, moderate sodium restriction and increased consumption of fresh fruit, vegetables, and low fat dairy, alcohol moderation, and smoking cessation.;Monitor prescription use compliance.    Expected Outcomes Short Term: Continued assessment and intervention until BP is < 140/958mHG in hypertensive participants. < 130/8032mG in hypertensive participants with diabetes, heart failure or chronic kidney disease.;Long Term: Maintenance of blood pressure at goal levels.    Lipids Yes    Intervention Provide education and support for participant on nutrition & aerobic/resistive exercise along with prescribed medications to achieve LDL <108m108mDL >40mg81m Expected Outcomes Short Term: Participant states  understanding of desired cholesterol values and is compliant with medications prescribed. Participant is following exercise prescription and nutrition guidelines.;Long Term: Cholesterol controlled with medications as prescribed, with individualized exercise RX and with personalized nutrition plan. Value goals: LDL < 108mg,32m > 40 mg.    Stress Yes    Intervention Offer individual and/or small group education and counseling on adjustment to heart disease, stress management and health-related lifestyle change. Teach and support self-help strategies.;Refer participants experiencing significant psychosocial distress to appropriate mental health specialists for further evaluation and treatment. When possible, include family members and significant others in education/counseling sessions.    Expected Outcomes Short Term: Participant demonstrates changes in health-related behavior, relaxation and other stress management skills, ability to obtain effective social support, and compliance with psychotropic medications if prescribed.;Long Term: Emotional wellbeing is indicated by absence of clinically significant psychosocial distress or social isolation.    Personal Goal Other Yes    Personal Goal Short term: walk with less SOB Long term: gain strength    Intervention Will continue to monitor pt and progress workloads as tolerated without sign or symptom    Expected Outcomes Pt will achieve his goals and gain strength              Personal Goals Discharge:  Goals and Risk Factor Review     Row Name 12/18/21 1533 12/26/21 1422 01/23/22 1114 02/13/22 1526       Core Components/Risk Factors/Patient Goals Review   Personal Goals Review Weight Management/Obesity;Stress;Hypertension;Diabetes;Lipids Weight Management/Obesity;Stress;Hypertension;Diabetes;Lipids Weight Management/Obesity;Stress;Hypertension;Diabetes;Lipids Weight Management/Obesity;Stress;Hypertension;Diabetes;Lipids    Review patient started  intensive cardiac rehab today, pt tolerated well, vss, denies pain. Greg hMarya Amslerompleted 2  session of intensive cardiac rehab and is off to a good start to exercise. Vital signs and CBG's have been stable [name] has been doing well with exercise at intensive cardiac rehav. Vital signs and CBG's have been stable. [name] has gained weight since starting intensive cardiac rehab. [facility] team has been notified. [name] says has has not been following a heaart healthy diet as he should. Will continue to monitor. [name] has been doing well with exercise at intensive cardiac rehav. Vital signs and CBG's have been stable. [name] has gained weight since starting intensive cardiac rehab. [facility] team has been notified. [name] has gained 4.2 kg since starting the program. [name] will compete cardiac rehab on 02/16/22.    Expected Outcomes Patient will maintain a healthy lifestyle as he is provided the knowledge and tools neccessary to complete goal, patient will increase exercise stamina during program. Patient will maintain a healthy lifestyle as he is provided the knowledge and tools neccessary to complete goal, patient will increase exercise stamina during program. Patient will maintain a healthy lifestyle as he is provided the knowledge and tools neccessary to complete goal, patient will increase exercise stamina during program. Patient will maintain a healthy lifestyle as he is provided the knowledge and tools neccessary to complete goal, patient will increase exercise stamina during program.             Exercise Goals and Review:  Exercise Goals     Row Name 12/14/21 1358             Exercise Goals   Increase Physical Activity Yes       Intervention Provide advice, education, support and counseling about physical activity/exercise needs.;Develop an individualized exercise prescription for aerobic and resistive training based on initial evaluation findings, risk stratification, comorbidities and  participant's personal goals.       Expected Outcomes Short Term: Attend rehab on a regular basis to increase amount of physical activity.;Long Term: Add in home exercise to make exercise part of routine and to increase amount of physical activity.;Long Term: Exercising regularly at least 3-5 days a week.       Increase Strength and Stamina Yes       Intervention Provide advice, education, support and counseling about physical activity/exercise needs.;Develop an individualized exercise prescription for aerobic and resistive training based on initial evaluation findings, risk stratification, comorbidities and participant's personal goals.       Expected Outcomes Short Term: Increase workloads from initial exercise prescription for resistance, speed, and METs.;Short Term: Perform resistance training exercises routinely during rehab and add in resistance training at home;Long Term: Improve cardiorespiratory fitness, muscular endurance and strength as measured by increased METs and functional capacity (6MWT)       Able to understand and use rate of perceived exertion (RPE) scale Yes       Intervention Provide education and explanation on how to use RPE scale       Expected Outcomes Short Term: Able to use RPE daily in rehab to express subjective intensity level;Long Term:  Able to use RPE to guide intensity level when exercising independently       Knowledge and understanding of Target Heart Rate Range (THRR) Yes       Intervention Provide education and explanation of THRR including how the numbers were predicted and where they are located for reference       Expected Outcomes Short Term: Able to state/look up THRR;Long Term: Able to use THRR to govern intensity when exercising  independently;Short Term: Able to use daily as guideline for intensity in rehab       Understanding of Exercise Prescription Yes       Intervention Provide education, explanation, and written materials on patient's individual exercise  prescription       Expected Outcomes Short Term: Able to explain program exercise prescription;Long Term: Able to explain home exercise prescription to exercise independently                Exercise Goals Re-Evaluation:  Exercise Goals Re-Evaluation     Row Name 12/18/21 1141 12/25/21 1121 01/17/22 1059 02/05/22 1053 02/19/22 1105     Exercise Goal Re-Evaluation   Exercise Goals Review Increase Physical Activity;Able to understand and use rate of perceived exertion (RPE) scale Increase Physical Activity;Able to understand and use rate of perceived exertion (RPE) scale Increase Physical Activity;Able to understand and use rate of perceived exertion (RPE) scale;Understanding of Exercise Prescription;Increase Strength and Stamina;Knowledge and understanding of Target Heart Rate Range (THRR) Increase Physical Activity;Able to understand and use rate of perceived exertion (RPE) scale;Understanding of Exercise Prescription;Increase Strength and Stamina;Knowledge and understanding of Target Heart Rate Range (THRR) Increase Physical Activity;Able to understand and use rate of perceived exertion (RPE) scale;Understanding of Exercise Prescription;Increase Strength and Stamina;Knowledge and understanding of Target Heart Rate Range (THRR)   Comments Patient able to understand and use RPE scale appropriately. Patient making gradual progress with exercise. Reviewed exercise prescription with patient. [facility] as his mode of home exercise. Patient waslk previously able to swim 10 laps, his goals is to be able to that again. Patient plans to use weight machines for his resistance training starting light and building up as tolerated. Patient continues to make gradual progress with exericse. Patient states he feels good and feels his balance is better. [facility] upon completion of the cardiac rehab program. Patient  scheduled to complete cardiac rehab program this week. [facility] and [facility].   Expected Outcomes Progress workloads as tolerated to help increase strength and stamina. Continue to progress workloads as tolerated. Patient will add 1-2 day of aerobic exercise in addition to exercise at cardiac rehab to achieve 150 minutes of aerobic exericse/week. Continue to progress workloads as tolerated. Patient plans to contiue exercise at the [facility] upon completion of the cardaic rehab program.    [place] Name 02/23/22 1130             Exercise Goal Re-Evaluation   Exercise Goals Review Increase Physical Activity;Able to understand and use rate of perceived exertion (RPE) scale;Understanding of Exercise Prescription;Increase Strength and Stamina;Knowledge and understanding of Target Heart Rate Range (THRR)       Comments Patient completed the cardiac rehab program today and feels the program has been very beneficial for him stating the program has saved his life. Patient very emotional and appreciative to the staff. [facility] at [facility] 30 minutes of aerobic exercise and resistance training Monday,Wednesday, and Fridays. Patient states his physician at [facility] wants him to walk on the treadmill in preparation for a test, possibly a stress test. Patient states it's been a year, and he will probably start at 1.5 mph and work up to 2.0 mph 10-15 minutes as tolerated. We had him walk on the TM at 1.5 for a couple of minutes to  get a feel for the speed. Discussed using the safety lanyard, starting on the side rails, starting and stopping at a slow pace. Patient also plans to swim and use the weight machines ie leg press, squats, bench and biceps. Discussed walking at home on the days he doesn't [facility] to the [facility] to accumulate 150 minutes of aerobic exercise, and patient is amenable to this.       Expected Outcomes Patient will exercise at least 30 minutes  3 days/week and will try to achieve 150 minutes of aerobic exercise by adding walking at home or by adding more days per [facility] the [facility] to maintain health and fitness gains.                Nutrition & Weight - Outcomes:   Post Biometrics - 02/23/22 1044        Post  Biometrics   Height 6' 0.25" (1.835 m)    Waist Circumference 48.75 inches    Hip Circumference 47.5 inches    Waist to Hip Ratio 1.03 %    BMI (Calculated) 35.64    Triceps Skinfold 16 mm    % Body Fat 34.1 %    Grip Strength 46 kg    Flexibility 12.88 in    Single Leg Stand --   Not performed. Balance concerns, pt feels unsteady            Nutrition:  Nutrition Therapy & Goals - 02/23/22 1049       Nutrition Therapy   Diet Heart Healthy/Carbohydrate Consistent    Drug/Food Interactions Statins/Certain Fruits      Personal Nutrition Goals   Nutrition Goal Patient to choose a daily variety of fruits, vegetables, whole grains, lean protein/plant protein, and nonfat dairy as part of heart healthy lifestyle    Personal Goal #2 Patient to limit sodium intake to <1524m of sodium daily    Personal Goal #3 Patient to identify and limit daily intake of saturated fat, trans fat, sodium, and refined carbohydrates    Comments Goals in progress. GMarya Amslergraduates today. GMarya Amslerhad attended the [employer]. He has good understanding of benefits of low sodium diet, high fiber intake, and moderation of saturated fat intake. Adhereance to low sodium diet and appropriate carbohydrate intake remains variable as he does continue to eat out regularly. He continues to struggle with binge type eating behaviors. GMarya Amsleris up 12.3# since starting with our program. Support from his wife remains limited. [facility] he has been receptive to behavioral health/psychiatry services.      Intervention Plan   Intervention Prescribe, educate and counsel regarding individualized specific dietary  modifications aiming towards targeted core components such as weight, hypertension, lipid management, diabetes, heart failure and other comorbidities.;Nutrition handout(s) given to patient.    Expected Outcomes Short Term Goal: Understand basic principles of dietary content, such as calories, fat, sodium, cholesterol and nutrients.;Long Term Goal: Adherence to prescribed nutrition plan.             Nutrition Discharge:  Nutrition Assessments - 02/12/22 1208       Rate Your Plate Scores   Post Score 62             Education Questionnaire Score:  Knowledge Questionnaire Score - 02/14/22 0951       Knowledge Questionnaire Score   Pre Score 19/24    Post Score 20/24  Goals reviewed with patient; copy given to patient.Pt graduates from  Intensive/Traditional cardiac rehab program today with completion of  23 exercise and  23 education sessions. Pt maintained good attendance and progressed nicely during their participation in rehab as evidenced by increased MET level.   Medication list reconciled. Repeat  PHQ score-0   [name] is still experiencing depression  and plans to set up counseling although [name] did not says he was depressed on his last day of exercise. Pt has made significant lifestyle changes and should be commended for their success. [name] achieved their goals during cardiac rehab.   Pt plans to continue exercise at the [facility].[name] increased his distance by 192 feet.  We are proud of Greg's progress. [name] says the program was very helpful and that he feels stronger.Harrell Gave RN BSN .

## 2022-02-21 ENCOUNTER — Encounter (HOSPITAL_COMMUNITY)
Admission: RE | Admit: 2022-02-21 | Discharge: 2022-02-21 | Disposition: A | Discharge status: Home / Self Care | Payer: Medicare Other | Source: Ambulatory Visit | Attending: Cardiology | Admitting: Cardiology

## 2022-02-21 DIAGNOSIS — Z941 Heart transplant status: Secondary | ICD-10-CM

## 2022-02-21 LAB — GLUCOSE, CAPILLARY: Glucose-Capillary: 161 mg/dL — ABNORMAL HIGH (ref 70–99)

## 2022-02-21 NOTE — Progress Notes (Addendum)
Tearful today. Victor [name] says its "one of those days." Victor [name] says he is interested in receiving counseling. Patient said its okay to have [name] to reach out to him for counseling. Will forward contact information..Emotional support provided to the patient. Victor [name] did not disclose what is bothering him today. Harrell Gave RN BSN

## 2022-02-23 ENCOUNTER — Encounter (HOSPITAL_COMMUNITY)
Admission: RE | Admit: 2022-02-23 | Discharge: 2022-02-23 | Disposition: A | Discharge status: Home / Self Care | Payer: Medicare Other | Source: Ambulatory Visit | Attending: Cardiology | Admitting: Cardiology

## 2022-02-23 VITALS — BP 124/86 | HR 85 | Ht 72.25 in | Wt 264.6 lb

## 2022-02-23 DIAGNOSIS — Z941 Heart transplant status: Secondary | ICD-10-CM | POA: Diagnosis not present

## 2022-03-08 ENCOUNTER — Telehealth: Payer: Self-pay | Admitting: Registered Nurse

## 2022-03-08 MED ORDER — OXYCODONE HCL 15 MG PO TABS: 15.0000 mg | ORAL_TABLET | Freq: Three times a day (TID) | ORAL | 0 refills | Status: DC | PRN

## 2022-03-08 NOTE — Telephone Encounter (Signed)
PMP was Reviewed.  Oxycodone e-scribed, Victor [name] is aware via My-Chart message.

## 2022-03-20 ENCOUNTER — Ambulatory Visit (INDEPENDENT_AMBULATORY_CARE_PROVIDER_SITE_OTHER): Payer: Medicare Other | Admitting: Psychologist

## 2022-03-20 DIAGNOSIS — F331 Major depressive disorder, recurrent, moderate: Secondary | ICD-10-CM | POA: Diagnosis not present
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
## 2022-03-20 NOTE — Progress Notes (Signed)
                Travers Goodley, PsyD 

## 2022-03-20 NOTE — Plan of Care (Signed)

## 2022-04-06 ENCOUNTER — Encounter: Payer: Self-pay | Admitting: Registered Nurse

## 2022-04-06 ENCOUNTER — Encounter: Payer: Medicare Other | Attending: Registered Nurse | Admitting: Registered Nurse

## 2022-04-06 VITALS — BP 132/94 | Ht 72.0 in | Wt 269.4 lb

## 2022-04-06 DIAGNOSIS — F418 Other specified anxiety disorders: Secondary | ICD-10-CM | POA: Diagnosis present

## 2022-04-06 DIAGNOSIS — G894 Chronic pain syndrome: Secondary | ICD-10-CM | POA: Diagnosis present

## 2022-04-06 DIAGNOSIS — Z79891 Long term (current) use of opiate analgesic: Secondary | ICD-10-CM | POA: Insufficient documentation

## 2022-04-06 DIAGNOSIS — Z5181 Encounter for therapeutic drug level monitoring: Secondary | ICD-10-CM | POA: Diagnosis present

## 2022-04-06 DIAGNOSIS — S8412XS Injury of peroneal nerve at lower leg level, left leg, sequela: Secondary | ICD-10-CM | POA: Diagnosis present

## 2022-04-06 DIAGNOSIS — M25572 Pain in left ankle and joints of left foot: Secondary | ICD-10-CM | POA: Diagnosis present

## 2022-04-06 DIAGNOSIS — G8929 Other chronic pain: Secondary | ICD-10-CM | POA: Insufficient documentation

## 2022-04-06 DIAGNOSIS — M25562 Pain in left knee: Secondary | ICD-10-CM | POA: Insufficient documentation

## 2022-04-06 MED ORDER — OXYCODONE HCL 15 MG PO TABS: 15.0000 mg | ORAL_TABLET | Freq: Three times a day (TID) | ORAL | 0 refills | Status: DC | PRN
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
## 2022-04-13 ENCOUNTER — Ambulatory Visit (INDEPENDENT_AMBULATORY_CARE_PROVIDER_SITE_OTHER): Payer: Medicare Other | Admitting: Psychologist

## 2022-04-13 DIAGNOSIS — F331 Major depressive disorder, recurrent, moderate: Secondary | ICD-10-CM

## 2022-04-13 NOTE — Progress Notes (Signed)
                Josephine Rudnick, PsyD 

## 2022-04-13 NOTE — Progress Notes (Signed)
[place] Counselor/Therapist Progress Note  Patient ID: Victor [name], MRN: [id number],    Date: 04/13/2022  Time Spent: 09:03 am to 09:41 am; total time: 38 minutes   This session was held via in person. The patient consented to in-person therapy and was in the clinician's office. Limits of confidentiality were discussed with the patient.    Treatment Type: Individual Therapy  Reported Symptoms: Depression  Mental Status Exam: Appearance:  Well Groomed     Behavior: Appropriate  Motor: Normal  Speech/Language:  Clear and Coherent  Affect: Appropriate  Mood: normal  Thought process: normal  Thought content:   WNL  Sensory/Perceptual disturbances:   WNL  Orientation: oriented to person, place, and time/date  Attention: Good  Concentration: Good  Memory: WNL  Fund of knowledge:  Good  Insight:   Fair  Judgment:  Good  Impulse Control: Good   Risk Assessment: Danger to Self:  No Self-injurious Behavior: No Danger to Others: No Duty to Warn:no Physical Aggression / Violence:No  Access to Firearms a concern: No  Gang Involvement:No   Subjective: Beginning the session, patient described himself as doing okay. After reviewing the treatment plan, patient spent the session doing a year review of the past year following the transplant. He reflected on challenges and how he has grown through some challenges. He also voiced that he and his wife are looking to separate come February or March. He voiced that she no longer feels attracted to him. Continuing to talk, he identified with feeling lonely. Elaborating, he indicated that he felt lonely both romantically as well as lack of friends. He processed thoughts and emotions. He asked to follow up. He denied suicidal and homicidal ideation.    Interventions:  Worked on developing a therapeutic relationship with the patient using active listening and reflective statements. Provided emotional support using empathy and  validation. Reviewed the treatment plan with the patient. Reviewed events since the intake. Processed emotions related to the holidays. Assisted the patient in doing a year review of the past year. Normalized and validated thoughts. Explored challenges in relationship with wife. Used socratic questions to assist the patient gain insight into self. Processed the idea of whether or not reconciliation is possible. Attempted to assist with problem solving. Identified the theme of loneliness and explored what this looks like to the patient. Began to explore the idea of where patient could get social meets met.  Processed the idea of reaching out to donor family. Assisted in problem solving. Provided empathic statements. Assessed for suicidal and homicidal ideation.    Homework: Look into heart transplant men support groups  Next Session: Review homework. Process where to get social needs met. Emotional support  Diagnosis: F33.1 major depressive affective disorder, recurrent, moderate  Plan:  Goals Work through the grieving process and face reality of own death Accept emotional support from others around them Live life to the fullest, event though time may be limited Become as knowledgeable about the medical condition  Reduce fear, anxiety about the health condition  Accept the illness Accept the role of psychological and behavioral factors  Stabilize anxiety level wile increasing ability to function Learn and implement coping skills that result in a reduction of anxiety  Alleviate depressive symptoms Recognize, accept, and cope with depressive feelings Develop healthy thinking patterns Develop healthy interpersonal relationships  Objectives target date for all objectives is 03/21/2023 Identify feelings associated with the illness Family members share with each other feelings Identify the losses or limitations that  have been experienced Verbalize acceptance of the reality of the medical  condition Commit to learning and implement a proactive approach to managing personal stresses Verbalize an understanding of the medical condition Work with therapist to develop a plan for coping with stress Learn and implement skills for managing stress Engage in social, productive activities that are possible Engage in faith based activities implement positive imagery Identify coping skills and sources of emotional support Patient's partner and family members verbalize their fears regarding severity of health condition Identify sources of emotional distress  Learning and implement calming skills to reduce overall anxiety Learn and implement problem solving strategies Identify and engage in pleasant activities Learning and implement personal and interpersonal skills to reduce anxiety and improve interpersonal relationships Learn to accept limitations in life and commit to tolerating, rather than avoiding, unpleasant emotions while accomplishing meaningful goals Identify major life conflicts from the past and present that form the basis for present anxiety Learn and implement behavioral strategies Verbalize an understanding and resolution of current interpersonal problems Learn and implement problem solving and decision making skills Learn and implement conflict resolution skills to resolve interpersonal problems Verbalize an understanding of healthy and unhealthy emotions verbalize insight into how past relationships may be influence current experiences with depression Use mindfulness and acceptance strategies and increase value based behavior  Increase hopeful statements about the future.   Interventions Teach about stress and ways to handle stress Assist the patient in developing a coping action plan for stressors Conduct skills based training for coping strategies Train problem focused skills Sort out what activities the individual can do Encourage patient to rely upon his/her  spiritual faith Teach the patient to use guided imagery Probe and evaluate family's ability to provide emotional support Allow family to share their fears Assist the patient in identifying, sorting through, and verbalizing the various feelings generated by his/her medical condition Meet with family members  Ask patient list out limitations  Use stress inoculation training  Use Acceptance and Commitment Therapy to help client accept uncomfortable realities in order to accomplish value-consistent goals Reinforce the client's insight into the role of his/her past emotional pain and present anxiety  Discuss examples demonstrating that unrealistic worry overestimates the probability of threats and underestimate patient's ability  Assist the patient in analyzing his or her worries Help patient understand that avoidance is reinforcing  Behavioral activation help the client explore the relationship, nature of the dispute,  Help the client develop new interpersonal skills and relationships Conduct Problem so living therapy Teach conflict resolution skills Use a process-experiential approach Conduct TLDP Conduct ACT  The patient and clinician reviewed the treatment plan on 04/13/2022. The patient approved of the treatment plan.    Conception Chancy, PsyD

## 2022-05-03 ENCOUNTER — Encounter: Payer: Self-pay | Admitting: Podiatry

## 2022-05-03 ENCOUNTER — Ambulatory Visit (INDEPENDENT_AMBULATORY_CARE_PROVIDER_SITE_OTHER): Payer: Medicare Other | Admitting: Podiatry

## 2022-05-03 DIAGNOSIS — M19172 Post-traumatic osteoarthritis, left ankle and foot: Secondary | ICD-10-CM

## 2022-05-03 DIAGNOSIS — R600 Localized edema: Secondary | ICD-10-CM | POA: Diagnosis not present

## 2022-05-03 DIAGNOSIS — L600 Ingrowing nail: Secondary | ICD-10-CM
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
## 2022-05-03 NOTE — Patient Instructions (Signed)

## 2022-05-09 ENCOUNTER — Encounter: Payer: Self-pay | Admitting: Hematology and Oncology

## 2022-05-12 ENCOUNTER — Other Ambulatory Visit: Payer: Self-pay | Admitting: Registered Nurse

## 2022-05-12 DIAGNOSIS — F418 Other specified anxiety disorders: Secondary | ICD-10-CM

## 2022-05-16 ENCOUNTER — Ambulatory Visit: Payer: Medicare Other | Admitting: Psychologist

## 2022-05-16 NOTE — Telephone Encounter (Signed)
Will ask Victor [name] to call office regarding his Alprazolam.  Last fill date: 02/16/2022

## 2022-05-28 ENCOUNTER — Encounter: Payer: Self-pay | Admitting: Hematology and Oncology

## 2022-06-07 ENCOUNTER — Encounter: Payer: Medicare HMO | Attending: Registered Nurse | Admitting: Registered Nurse

## 2022-06-07 ENCOUNTER — Encounter: Payer: Self-pay | Admitting: Registered Nurse

## 2022-06-07 VITALS — BP 153/101 | HR 80 | Ht 72.0 in | Wt 282.0 lb

## 2022-06-07 DIAGNOSIS — Z79891 Long term (current) use of opiate analgesic: Secondary | ICD-10-CM | POA: Diagnosis present

## 2022-06-07 DIAGNOSIS — M25572 Pain in left ankle and joints of left foot: Secondary | ICD-10-CM | POA: Insufficient documentation

## 2022-06-07 DIAGNOSIS — S8412XS Injury of peroneal nerve at lower leg level, left leg, sequela: Secondary | ICD-10-CM | POA: Diagnosis present

## 2022-06-07 DIAGNOSIS — M25562 Pain in left knee: Secondary | ICD-10-CM | POA: Insufficient documentation

## 2022-06-07 DIAGNOSIS — Z5181 Encounter for therapeutic drug level monitoring: Secondary | ICD-10-CM | POA: Diagnosis present

## 2022-06-07 DIAGNOSIS — F418 Other specified anxiety disorders: Secondary | ICD-10-CM | POA: Insufficient documentation

## 2022-06-07 DIAGNOSIS — G894 Chronic pain syndrome: Secondary | ICD-10-CM | POA: Insufficient documentation

## 2022-06-07 DIAGNOSIS — G8929 Other chronic pain: Secondary | ICD-10-CM | POA: Insufficient documentation

## 2022-06-07 DIAGNOSIS — I10 Essential (primary) hypertension: Secondary | ICD-10-CM | POA: Diagnosis present

## 2022-06-07 MED ORDER — PREGABALIN 150 MG PO CAPS: 150.0000 mg | ORAL_CAPSULE | Freq: Three times a day (TID) | ORAL | 3 refills | Status: DC

## 2022-06-07 MED ORDER — OXYCODONE HCL 15 MG PO TABS: 15.0000 mg | ORAL_TABLET | Freq: Three times a day (TID) | ORAL | 0 refills | Status: DC | PRN

## 2022-06-07 MED ORDER — ALPRAZOLAM 0.5 MG PO TABS: 0.5000 mg | ORAL_TABLET | Freq: Two times a day (BID) | ORAL | 1 refills | Status: DC | PRN
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
## 2022-06-12 LAB — TOXASSURE SELECT,+ANTIDEPR,UR

## 2022-06-18 ENCOUNTER — Other Ambulatory Visit: Payer: Self-pay | Admitting: Urology

## 2022-06-18 DIAGNOSIS — R972 Elevated prostate specific antigen [PSA]: Secondary | ICD-10-CM

## 2022-06-20 ENCOUNTER — Other Ambulatory Visit: Payer: Self-pay | Admitting: Registered Nurse

## 2022-06-20 DIAGNOSIS — F418 Other specified anxiety disorders: Secondary | ICD-10-CM

## 2022-06-22 ENCOUNTER — Inpatient Hospital Stay: Payer: Medicare HMO | Admitting: Hematology and Oncology

## 2022-06-25 ENCOUNTER — Encounter: Payer: Self-pay | Admitting: Hematology and Oncology

## 2022-06-27 ENCOUNTER — Encounter: Payer: Self-pay | Admitting: Podiatry

## 2022-06-27 ENCOUNTER — Other Ambulatory Visit: Payer: Self-pay | Admitting: Podiatry

## 2022-06-27 ENCOUNTER — Ambulatory Visit (INDEPENDENT_AMBULATORY_CARE_PROVIDER_SITE_OTHER): Payer: Medicare HMO

## 2022-06-27 ENCOUNTER — Ambulatory Visit (INDEPENDENT_AMBULATORY_CARE_PROVIDER_SITE_OTHER): Payer: Medicare HMO | Admitting: Podiatry

## 2022-06-27 DIAGNOSIS — M109 Gout, unspecified: Secondary | ICD-10-CM | POA: Diagnosis not present

## 2022-06-27 DIAGNOSIS — M7751 Other enthesopathy of right foot: Secondary | ICD-10-CM

## 2022-06-27 DIAGNOSIS — M79671 Pain in right foot: Secondary | ICD-10-CM

## 2022-06-27 MED ORDER — TRIAMCINOLONE ACETONIDE 10 MG/ML IJ SUSP
10.0000 mg | Freq: Once | INTRAMUSCULAR | Status: AC
  Administered 2022-06-27: 10 mg

## 2022-06-27 MED ORDER — METHYLPREDNISOLONE 4 MG PO TBPK: ORAL_TABLET | ORAL | 0 refills | Status: DC

## 2022-06-27 NOTE — Patient Instructions (Signed)
Low-Purine Eating Plan A low-purine eating plan involves making food choices to limit your purine intake. Purine is a kind of uric acid. Too much uric acid in your blood can cause certain conditions, such as gout and kidney stones. Eating a low-purine diet may help control these conditions. What are tips for following this plan? Shopping Avoid buying products that contain high-fructose corn syrup. Check for this on food labels. It is commonly found in many processed foods and soft drinks. Be sure to check for it in baked goods such as cookies, canned fruits, and cereals and cereal bars. Avoid buying veal, chicken breast with skin, lamb, and organ meats such as liver. These types of meats tend to have the highest purine content. Choose dairy products. These may lower uric acid levels. Avoid certain types of fish. Not all fish and seafood have high purine content. Examples with high purine content include anchovies, trout, tuna, sardines, and salmon. Avoid buying beverages that contain alcohol, particularly beer and hard liquor. Alcohol can affect the way your body gets rid of uric acid. Meal planning  Learn which foods do or do not affect you. If you find out that a food tends to cause your gout symptoms to flare up, avoid eating that food. You can enjoy foods that do not cause problems. If you have any questions about a food item, talk with your dietitian or health care provider. Reduce the overall amount of meat in your diet. When you do eat meat, choose ones with lower purine content. Include plenty of fruits and vegetables. Although some vegetables may have a high purine content--such as asparagus, mushrooms, spinach, or cauliflower--it has been shown that these do not contribute to uric acid blood levels as much. Consume at least 1 dairy serving a day. This has been shown to decrease uric acid levels. General information If you drink alcohol: Limit how much you have to: 0-1 drink a day for  women who are not pregnant. 0-2 drinks a day for men. Know how much alcohol is in a drink. In the [place] one drink equals one 12 oz bottle of beer (355 mL), one 5 oz glass of wine (148 mL), or one 1 oz glass of hard liquor (44 mL). Drink plenty of water. Try to drink enough to keep your urine pale yellow. Fluids can help remove uric acid from your body. Work with your health care provider and dietitian to develop a plan to achieve or maintain a healthy weight. Losing weight may help reduce uric acid in your blood. What foods are recommended? The following are some types of foods that are good choices when limiting purine intake: Fresh or frozen fruits and vegetables. Whole grains, breads, cereals, and pasta. Rice. Beans, peas, legumes. Nuts and seeds. Dairy products. Fats and oils. The items listed above may not be a complete list. Talk with a dietitian about what dietary choices are best for you. What foods are not recommended? Limit your intake of foods high in purines, including: Beer and other alcohol. Meat-based gravy or sauce. Canned or fresh fish, such as: Anchovies, sardines, herring, salmon, and tuna. Mussels and scallops. Codfish, trout, and haddock. Bacon, veal, chicken breast with skin, and lamb. Organ meats, such as: Liver or kidney. Tripe. Sweetbreads (thymus gland or pancreas). Wild game or goose. Yeast or yeast extract supplements. Drinks sweetened with high-fructose corn syrup, such as soda. Processed foods made with high-fructose corn syrup. The items listed above may not be a complete list of foods   and beverages you should limit. Contact a dietitian for more information. Summary Eating a low-purine diet may help control conditions caused by too much uric acid in the body, such as gout or kidney stones. Choose low-purine foods, limit alcohol, and limit high-fructose corn syrup. You will learn over time which foods do or do not affect you. If you find out that a  food tends to cause your gout symptoms to flare up, avoid eating that food. This information is not intended to replace advice given to you by your health care provider. Make sure you discuss any questions you have with your health care provider. Document Revised: 04/06/2021 Document Reviewed: 04/06/2021 Elsevier Patient Education  2023 [employer]  

## 2022-06-27 NOTE — Progress Notes (Signed)
Subjective:   Patient ID: Victor [name], male   DOB: 62 y.o.   MRN: [id number]   HPI Patient presents with painful big toe joint right and states that it started this weekend but has had several instances of this over the last couple months and also did eat a seafood shellfish dinner last Wednesday prior to this developing   ROS      Objective:  Physical Exam  Ocular status intact digital perfusion good swelling inflammation pain around the first MPJ right with fluid buildup around the joint surface     Assessment:  Inflammatory capsulitis first MPJ right along with what appears to be acute case of gout with no family history     Plan:  H&P and educated him on gout discussing with him foods to watch and wrote him out sheets that discussed foods to be careful with.  Did review x-ray negative for signs of osteolysis fracture or arthritis and I went ahead did sterile prep and injected around the first MPJ 3 mg Kenalog 5 mg Xylocaine and placed on 6-day steroid Dosepak and sent for blood work to look at uric acid and inflammatory markers  X-rays were reviewed with patient currently above

## 2022-06-28 ENCOUNTER — Other Ambulatory Visit: Payer: Self-pay | Admitting: Podiatry

## 2022-06-28 DIAGNOSIS — M7751 Other enthesopathy of right foot: Secondary | ICD-10-CM

## 2022-06-28 DIAGNOSIS — M79671 Pain in right foot: Secondary | ICD-10-CM

## 2022-06-28 DIAGNOSIS — M109 Gout, unspecified: Secondary | ICD-10-CM

## 2022-06-28 LAB — CBC WITH DIFFERENTIAL/PLATELET
Basophils Absolute: 0 10*3/uL (ref 0.0–0.2)
Basos: 0 %
EOS (ABSOLUTE): 0.1 10*3/uL (ref 0.0–0.4)
Eos: 1 %
Hematocrit: 47.5 % (ref 37.5–51.0)
Hemoglobin: 15.3 g/dL (ref 13.0–17.7)
Lymphocytes Absolute: 1.8 10*3/uL (ref 0.7–3.1)
Lymphs: 33 %
MCH: 28 pg (ref 26.6–33.0)
MCHC: 32.2 g/dL (ref 31.5–35.7)
MCV: 87 fL (ref 79–97)
Monocytes Absolute: 0.4 10*3/uL (ref 0.1–0.9)
Monocytes: 7 %
Neutrophils Absolute: 3.1 10*3/uL (ref 1.4–7.0)
Neutrophils: 56 %
Platelets: 110 10*3/uL — ABNORMAL LOW (ref 150–450)
RBC: 5.46 x10E6/uL (ref 4.14–5.80)
RDW: 14.3 % (ref 11.6–15.4)
WBC: 5.5 10*3/uL (ref 3.4–10.8)

## 2022-06-28 LAB — COMPREHENSIVE METABOLIC PANEL
ALT: 15 IU/L (ref 0–44)
AST: 27 IU/L (ref 0–40)
Albumin/Globulin Ratio: 1.8 (ref 1.2–2.2)
Albumin: 4.7 g/dL (ref 3.8–4.9)
Alkaline Phosphatase: 94 IU/L (ref 44–121)
BUN/Creatinine Ratio: 11 (ref 10–24)
BUN: 21 mg/dL (ref 8–27)
Bilirubin Total: 0.7 mg/dL (ref 0.0–1.2)
CO2: 14 mmol/L — ABNORMAL LOW (ref 20–29)
Calcium: 9 mg/dL (ref 8.6–10.2)
Chloride: 108 mmol/L — ABNORMAL HIGH (ref 96–106)
Creatinine, Ser: 1.86 mg/dL — ABNORMAL HIGH (ref 0.76–1.27)
Globulin, Total: 2.6 g/dL (ref 1.5–4.5)
Glucose: 113 mg/dL — ABNORMAL HIGH (ref 70–99)
Potassium: 4.9 mmol/L (ref 3.5–5.2)
Sodium: 141 mmol/L (ref 134–144)
Total Protein: 7.3 g/dL (ref 6.0–8.5)
eGFR: 41 mL/min/{1.73_m2} — ABNORMAL LOW (ref >59)

## 2022-06-28 LAB — RHEUMATOID FACTOR: Rheumatoid fact SerPl-aCnc: <10 IU/mL (ref <14.0)

## 2022-06-28 LAB — IMMATURE CELLS: Metamyelocytes: 3 % — ABNORMAL HIGH (ref 0–0)

## 2022-06-28 LAB — URIC ACID: Uric Acid: 7.5 mg/dL (ref 3.8–8.4)

## 2022-06-28 LAB — C-REACTIVE PROTEIN: CRP: 6 mg/L (ref 0–10)

## 2022-06-28 LAB — SEDIMENTATION RATE: Sed Rate: 9 mm/hr (ref 0–30)

## 2022-07-11 ENCOUNTER — Ambulatory Visit: Payer: Medicare HMO | Admitting: Podiatry

## 2022-07-16 ENCOUNTER — Ambulatory Visit
Admission: RE | Admit: 2022-07-16 | Discharge: 2022-07-16 | Disposition: A | Discharge status: Home / Self Care | Payer: Medicare HMO | Source: Ambulatory Visit | Attending: Urology | Admitting: Urology

## 2022-07-16 DIAGNOSIS — R972 Elevated prostate specific antigen [PSA]: Secondary | ICD-10-CM

## 2022-07-16 MED ORDER — GADOPICLENOL 0.5 MMOL/ML IV SOLN
10.0000 mL | Freq: Once | INTRAVENOUS | Status: AC | PRN
  Administered 2022-07-16: 10 mL via INTRAVENOUS

## 2022-07-20 ENCOUNTER — Ambulatory Visit: Payer: Medicare HMO | Admitting: Podiatry

## 2022-07-20 ENCOUNTER — Telehealth: Payer: Self-pay | Admitting: *Deleted

## 2022-07-20 NOTE — Telephone Encounter (Signed)
Urine drug screen for this encounter is consistent for prescribed medication 

## 2022-07-23 ENCOUNTER — Telehealth: Payer: Medicare HMO | Admitting: Physician Assistant

## 2022-07-23 ENCOUNTER — Telehealth: Payer: Medicare HMO

## 2022-07-23 DIAGNOSIS — J069 Acute upper respiratory infection, unspecified: Secondary | ICD-10-CM | POA: Diagnosis not present

## 2022-07-23 DIAGNOSIS — B9689 Other specified bacterial agents as the cause of diseases classified elsewhere: Secondary | ICD-10-CM

## 2022-07-23 MED ORDER — AMOXICILLIN-POT CLAVULANATE 875-125 MG PO TABS: 1.0000 | ORAL_TABLET | Freq: Two times a day (BID) | ORAL | 0 refills | Status: DC

## 2022-07-23 NOTE — Progress Notes (Signed)
Virtual Visit Consent   Gery Vanveen, you are scheduled for a virtual visit with a [facility] provider today. Just as with appointments in the office, your consent must be obtained to participate. Your consent will be active for this visit and any virtual visit you may have with one of our providers in the next 365 days. If you have a MyChart account, a copy of this consent can be sent to you electronically.  As this is a virtual visit, video technology does not allow for your provider to perform a traditional examination. This may limit your provider's ability to fully assess your condition. If your provider identifies any concerns that need to be evaluated in person or the need to arrange testing (such as labs, EKG, etc.), we will make arrangements to do so. Although advances in technology are sophisticated, we cannot ensure that it will always work on either your end or our end. If the connection with a video visit is poor, the visit may have to be switched to a telephone visit. With either a video or telephone visit, we are not always able to ensure that we have a secure connection.  By engaging in this virtual visit, you consent to the provision of healthcare and authorize for your insurance to be billed (if applicable) for the services provided during this visit. Depending on your insurance coverage, you may receive a charge related to this service.  I need to obtain your verbal consent now. Are you willing to proceed with your visit today? Victor [name] has provided verbal consent on 07/23/2022 for a virtual visit (video or telephone). Victor Daring, PA-C  Date: 07/23/2022 10:07 AM  Virtual Visit via Video Note   I, Victor [name], connected with  Victor [name]  ([id number], 16-Feb-1961) on 07/23/22 at 10:45 AM EDT by a video-enabled telemedicine application and verified that I am speaking with the correct person using two identifiers.  Location: Patient: Virtual Visit  Location Patient: Home Provider: Virtual Visit Location Provider: Home Office   I discussed the limitations of evaluation and management by telemedicine and the availability of in person appointments. The patient expressed understanding and agreed to proceed.    History of Present Illness: Victor [name] is a 62 y.o. who identifies as a male who was assigned male at birth, and is being seen today for URI symptoms.  HPI: URI  This is a new problem. The current episode started 1 to 4 weeks ago (just over a week ago; had 1-2 days where he felt better then symptoms worsen with increased phlegm and change in consistency and color). The problem has been gradually worsening. There has been no fever. Associated symptoms include congestion, coughing, diarrhea, headaches, rhinorrhea and a sore throat (initial symptom, lasted for 2 days). Pertinent negatives include no ear pain, nausea, plugged ear sensation, sinus pain, swollen glands or vomiting. Treatments tried: mucinex, nyquil. The treatment provided no relief.    PMH: Heart transplant   Problems:  Patient Active Problem List   Diagnosis Date Noted   Atrial fibrillation with RVR ([place])    Fever    Shock ([place])    Acute on chronic systolic heart failure, NYHA class 3 ([place]) 04/19/2021   Erroneous encounter - disregard 10/18/2020   Polycythemia, secondary 09/29/2020   Situational anxiety 12/31/2018   Open wound 01/16/2017   Hyperlipidemia 01/14/2015   Erectile dysfunction 09/21/2014   Osteomyelitis of left lower extremity ([place]) 08/11/2014   Rotator cuff rupture, complete 02/25/2014   Tendinitis  of left rotator cuff 11/17/2013   Pain in joint, shoulder region 09/07/2013   Biceps tendonitis on left 05/11/2013   Asthma without status asthmaticus 03/16/2013   BP (high blood pressure) 03/16/2013   History of inferior vena caval filter placement 03/16/2013   Chronic glaucoma 02/10/2013   Cardiomyopathy-  nonischemic 0000000   Chronic systolic  heart failure ([place]) 08/28/2012   Breast enlargement 08/28/2012   Sleep-disordered breathing 08/28/2012   Dyssomnia 08/28/2012   Pseudoaphakia 05/23/2012   Glaucoma suspect 03/18/2012   Nuclear sclerotic cataract 03/18/2012   Zonular dehiscence 03/18/2012   Post-traumatic osteoarthritis of left knee 07/23/2011   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   Plantar fasciitis 07/23/2011   Injury of peroneal nerve 07/23/2011   Lower limb nerve lesion 07/23/2011   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    Allergies: No Known Allergies Medications:  Current Outpatient Medications:    amoxicillin-clavulanate (AUGMENTIN) 875-125 MG tablet, Take 1 tablet by mouth 2 (two) times daily., Disp: 14 tablet, Rfl: 0   albuterol (VENTOLIN HFA) 108 (90 Base) MCG/ACT inhaler, Inhale 2 puffs into the lungs every 6 (six) hours as needed for wheezing or shortness of breath., Disp: , Rfl:    ALPRAZolam (XANAX) 0.5 MG tablet, Take 1 tablet (0.5 mg total) by mouth 2 (two) times daily as needed for anxiety., Disp: 60 tablet, Rfl: 1   atovaquone (MEPRON) 750 MG/5ML suspension, Take 1,500 mg by mouth every morning., Disp: , Rfl:    brinzolamide (AZOPT) 1 % ophthalmic suspension, Place 1 drop into the right eye in the morning and at bedtime., Disp: , Rfl:    Bromfenac Sodium (PROLENSA) 0.07 % SOLN, Apply to eye., Disp: , Rfl:    Calcium Carb-Cholecalciferol (CALCIUM + D3 PO), Take 1 tablet by mouth in the morning and at bedtime., Disp: , Rfl:    HUMULIN 70/30 KWIKPEN (70-30) 100 UNIT/ML KwikPen, Inject 15-30 Units into the skin See admin instructions. 30 units in the morning & 15 units at night if blood sugar is greater than 200., Disp: , Rfl:    methylPREDNISolone (MEDROL DOSEPAK) 4 MG TBPK tablet, follow package directions, Disp: 21 tablet, Rfl: 0   montelukast (SINGULAIR) 10 MG tablet, Take 10 mg by mouth at bedtime., Disp: , Rfl:    nystatin (MYCOSTATIN) 100000 UNIT/ML suspension, Take 5 mLs by mouth 4  (four) times daily as needed (oral pain/irritation.)., Disp: , Rfl:    oxyCODONE (ROXICODONE) 15 MG immediate release tablet, Take 1 tablet (15 mg total) by mouth 3 (three) times daily as needed for pain., Disp: 90 tablet, Rfl: 0   pantoprazole (PROTONIX) 40 MG tablet, Take 40 mg by mouth in the morning., Disp: , Rfl:    pravastatin (PRAVACHOL) 40 MG tablet, Take 40 mg by mouth at bedtime., Disp: , Rfl:    predniSONE (DELTASONE) 5 MG tablet, Take 5 mg by mouth in the morning., Disp: , Rfl:    pregabalin (LYRICA) 150 MG capsule, Take 1 capsule (150 mg total) by mouth in the morning, at noon, and at bedtime., Disp: 90 capsule, Rfl: 3   propranolol (INDERAL) 20 MG tablet, Take 20 mg by mouth in the morning and at bedtime., Disp: , Rfl:    senna-docusate (SENOKOT-S) 8.6-50 MG tablet, Take 1 tablet by mouth at bedtime as needed for mild constipation., Disp: , Rfl:    sertraline (ZOLOFT) 50 MG tablet, Take 50 mg by mouth daily., Disp: , Rfl:    tacrolimus (PROGRAF) 1 MG capsule, Take 8  mg by mouth in the morning and at bedtime., Disp: , Rfl:    traZODone (DESYREL) 50 MG tablet, Take by mouth., Disp: , Rfl:    valGANciclovir (VALCYTE) 450 MG tablet, Take 450 mg by mouth daily at 12 noon. 1300, Disp: , Rfl:    VEMLIDY 25 MG TABS, Take 25 mg by mouth in the morning., Disp: , Rfl:    WIXELA INHUB 250-50 MCG/ACT AEPB, Inhale 1 puff into the lungs 2 (two) times daily as needed (respiratory issues.)., Disp: , Rfl:   Observations/Objective: Patient is well-developed, well-nourished in no acute distress.  Resting comfortably at home.  Head is normocephalic, atraumatic.  No labored breathing.  Speech is clear and coherent with logical content.  Patient is alert and oriented at baseline.    Assessment and Plan: 1. Bacterial upper respiratory infection - amoxicillin-clavulanate (AUGMENTIN) 875-125 MG tablet; Take 1 tablet by mouth 2 (two) times daily.  Dispense: 14 tablet; Refill: 0  - Worsening symptoms  that have not responded to OTC medications with case of second sickening - Will give Augmentin - Continue allergy medications.  - Steam and humidifier can help - Stay well hydrated and get plenty of rest.  - Seek in person evaluation if no symptom improvement or if symptoms worsen   Follow Up Instructions: I discussed the assessment and treatment plan with the patient. The patient was provided an opportunity to ask questions and all were answered. The patient agreed with the plan and demonstrated an understanding of the instructions.  A copy of instructions were sent to the patient via MyChart unless otherwise noted below.    The patient was advised to call back or seek an in-person evaluation if the symptoms worsen or if the condition fails to improve as anticipated.  Time:  I spent 10 minutes with the patient via telehealth technology discussing the above problems/concerns.    Victor Daring, PA-C

## 2022-07-23 NOTE — Patient Instructions (Signed)
Rayburn Felt, thank you for joining Mar Daring, PA-C for today's virtual visit.  While this provider is not your primary care provider (PCP), if your PCP is located in our provider database this encounter information will be shared with them immediately following your visit.   [facility] account gives you access to today's visit and all your visits, tests, and labs performed at [facility] " click here if you don't have a [facility] account or go to [email or web address]  Consent: (Patient) Victor [name] provided verbal consent for this virtual visit at the beginning of the encounter.  Current Medications:  Current Outpatient Medications:    amoxicillin-clavulanate (AUGMENTIN) 875-125 MG tablet, Take 1 tablet by mouth 2 (two) times daily., Disp: 14 tablet, Rfl: 0   albuterol (VENTOLIN HFA) 108 (90 Base) MCG/ACT inhaler, Inhale 2 puffs into the lungs every 6 (six) hours as needed for wheezing or shortness of breath., Disp: , Rfl:    ALPRAZolam (XANAX) 0.5 MG tablet, Take 1 tablet (0.5 mg total) by mouth 2 (two) times daily as needed for anxiety., Disp: 60 tablet, Rfl: 1   atovaquone (MEPRON) 750 MG/5ML suspension, Take 1,500 mg by mouth every morning., Disp: , Rfl:    brinzolamide (AZOPT) 1 % ophthalmic suspension, Place 1 drop into the right eye in the morning and at bedtime., Disp: , Rfl:    Bromfenac Sodium (PROLENSA) 0.07 % SOLN, Apply to eye., Disp: , Rfl:    Calcium Carb-Cholecalciferol (CALCIUM + D3 PO), Take 1 tablet by mouth in the morning and at bedtime., Disp: , Rfl:    HUMULIN 70/30 KWIKPEN (70-30) 100 UNIT/ML KwikPen, Inject 15-30 Units into the skin See admin instructions. 30 units in the morning & 15 units at night if blood sugar is greater than 200., Disp: , Rfl:    methylPREDNISolone (MEDROL DOSEPAK) 4 MG TBPK tablet, follow package directions, Disp: 21 tablet, Rfl: 0   montelukast (SINGULAIR) 10 MG tablet, Take 10 mg by mouth  at bedtime., Disp: , Rfl:    nystatin (MYCOSTATIN) 100000 UNIT/ML suspension, Take 5 mLs by mouth 4 (four) times daily as needed (oral pain/irritation.)., Disp: , Rfl:    oxyCODONE (ROXICODONE) 15 MG immediate release tablet, Take 1 tablet (15 mg total) by mouth 3 (three) times daily as needed for pain., Disp: 90 tablet, Rfl: 0   pantoprazole (PROTONIX) 40 MG tablet, Take 40 mg by mouth in the morning., Disp: , Rfl:    pravastatin (PRAVACHOL) 40 MG tablet, Take 40 mg by mouth at bedtime., Disp: , Rfl:    predniSONE (DELTASONE) 5 MG tablet, Take 5 mg by mouth in the morning., Disp: , Rfl:    pregabalin (LYRICA) 150 MG capsule, Take 1 capsule (150 mg total) by mouth in the morning, at noon, and at bedtime., Disp: 90 capsule, Rfl: 3   propranolol (INDERAL) 20 MG tablet, Take 20 mg by mouth in the morning and at bedtime., Disp: , Rfl:    senna-docusate (SENOKOT-S) 8.6-50 MG tablet, Take 1 tablet by mouth at bedtime as needed for mild constipation., Disp: , Rfl:    sertraline (ZOLOFT) 50 MG tablet, Take 50 mg by mouth daily., Disp: , Rfl:    tacrolimus (PROGRAF) 1 MG capsule, Take 8 mg by mouth in the morning and at bedtime., Disp: , Rfl:    traZODone (DESYREL) 50 MG tablet, Take by mouth., Disp: , Rfl:    valGANciclovir (VALCYTE) 450 MG tablet, Take 450 mg by mouth daily at 12  noon. 1300, Disp: , Rfl:    VEMLIDY 25 MG TABS, Take 25 mg by mouth in the morning., Disp: , Rfl:    WIXELA INHUB 250-50 MCG/ACT AEPB, Inhale 1 puff into the lungs 2 (two) times daily as needed (respiratory issues.)., Disp: , Rfl:    Medications ordered in this encounter:  Meds ordered this encounter  Medications   amoxicillin-clavulanate (AUGMENTIN) 875-125 MG tablet    Sig: Take 1 tablet by mouth 2 (two) times daily.    Dispense:  14 tablet    Refill:  0    Order Specific Question:   Supervising Provider    Answer:   Chase Picket [id number]     *If you need refills on other medications prior to your next  appointment, please contact your pharmacy*  Follow-Up: Call back or seek an in-person evaluation if the symptoms worsen or if the condition fails to improve as anticipated.  [place] [phone]  Other Instructions  Upper Respiratory Infection, Adult An upper respiratory infection (URI) is a common viral infection of the nose, throat, and upper air passages that lead to the lungs. The most common type of URI is the common cold. URIs usually get better on their own, without medical treatment. What are the causes? A URI is caused by a virus. You may catch a virus by: Breathing in droplets from an infected person's cough or sneeze. Touching something that has been exposed to the virus (is contaminated) and then touching your mouth, nose, or eyes. What increases the risk? You are more likely to get a URI if: You are very young or very old. You have close contact with others, such as at work, school, or a health care facility. You smoke. You have long-term (chronic) heart or lung disease. You have a weakened disease-fighting system (immune system). You have nasal allergies or asthma. You are experiencing a lot of stress. You have poor nutrition. What are the signs or symptoms? A URI usually involves some of the following symptoms: Runny or stuffy (congested) nose. Cough. Sneezing. Sore throat. Headache. Fatigue. Fever. Loss of appetite. Pain in your forehead, behind your eyes, and over your cheekbones (sinus pain). Muscle aches. Redness or irritation of the eyes. Pressure in the ears or face. How is this diagnosed? This condition may be diagnosed based on your medical history and symptoms, and a physical exam. Your health care provider may use a swab to take a mucus sample from your nose (nasal swab). This sample can be tested to determine what virus is causing the illness. How is this treated? URIs usually get better on their own within 7-10 days. Medicines  cannot cure URIs, but your health care provider may recommend certain medicines to help relieve symptoms, such as: Over-the-counter cold medicines. Cough suppressants. Coughing is a type of defense against infection that helps to clear the respiratory system, so take these medicines only as recommended by your health care provider. Fever-reducing medicines. Follow these instructions at home: Activity Rest as needed. If you have a fever, stay home from work or school until your fever is gone or until your health care provider says your URI cannot spread to other people (is no longer contagious). Your health care provider may have you wear a face mask to prevent your infection from spreading. Relieving symptoms Gargle with a mixture of salt and water 3-4 times a day or as needed. To make salt water, completely dissolve -1 tsp (3-6 g) of salt in  1 cup (237 mL) of warm water. Use a cool-mist humidifier to add moisture to the air. This can help you breathe more easily. Eating and drinking  Drink enough fluid to keep your urine pale yellow. Eat soups and other clear broths. General instructions  Take over-the-counter and prescription medicines only as told by your health care provider. These include cold medicines, fever reducers, and cough suppressants. Do not use any products that contain nicotine or tobacco. These products include cigarettes, chewing tobacco, and vaping devices, such as e-cigarettes. If you need help quitting, ask your health care provider. Stay away from secondhand smoke. Stay up to date on all immunizations, including the yearly (annual) flu vaccine. Keep all follow-up visits. This is important. How to prevent the spread of infection to others URIs can be contagious. To prevent the infection from spreading: Wash your hands with soap and water for at least 20 seconds. If soap and water are not available, use hand sanitizer. Avoid touching your mouth, face, eyes, or  nose. Cough or sneeze into a tissue or your sleeve or elbow instead of into your hand or into the air.  Contact a health care provider if: You are getting worse instead of better. You have a fever or chills. Your mucus is brown or red. You have yellow or brown discharge coming from your nose. You have pain in your face, especially when you bend forward. You have swollen neck glands. You have pain while swallowing. You have white areas in the back of your throat. Get help right away if: You have shortness of breath that gets worse. You have severe or persistent: Headache. Ear pain. Sinus pain. Chest pain. You have chronic lung disease along with any of the following: Making high-pitched whistling sounds when you breathe, most often when you breathe out (wheezing). Prolonged cough (more than 14 days). Coughing up blood. A change in your usual mucus. You have a stiff neck. You have changes in your: Vision. Hearing. Thinking. Mood. These symptoms may be an emergency. Get help right away. Call 911. Do not wait to see if the symptoms will go away. Do not drive yourself to the hospital. Summary An upper respiratory infection (URI) is a common infection of the nose, throat, and upper air passages that lead to the lungs. A URI is caused by a virus. URIs usually get better on their own within 7-10 days. Medicines cannot cure URIs, but your health care provider may recommend certain medicines to help relieve symptoms. This information is not intended to replace advice given to you by your health care provider. Make sure you discuss any questions you have with your health care provider. Document Revised: 11/23/2020 Document Reviewed: 11/23/2020 Elsevier Patient Education  [place].    If you have been instructed to have an in-person evaluation today at a local Urgent Care facility, please use the link below. It will take you to a list of all of our available [facility], including address, phone number and hours of operation. Please do not delay care.  [facility]  If you or a family member do not have a primary care provider, use the link below to schedule a visit and establish care. When you choose a [facility] primary care physician or advanced practice provider, you gain a long-term partner in health. Find a Primary Care Provider  Learn more about [facility]'s in-office and virtual care options: [facility]

## 2022-07-25 ENCOUNTER — Ambulatory Visit: Payer: Medicare HMO | Admitting: Podiatry

## 2022-08-02 ENCOUNTER — Other Ambulatory Visit: Payer: Self-pay | Admitting: Registered Nurse

## 2022-08-02 ENCOUNTER — Ambulatory Visit (INDEPENDENT_AMBULATORY_CARE_PROVIDER_SITE_OTHER): Payer: Medicare HMO | Admitting: Podiatry

## 2022-08-02 ENCOUNTER — Other Ambulatory Visit: Payer: Self-pay | Admitting: Podiatry

## 2022-08-02 ENCOUNTER — Encounter: Payer: Self-pay | Admitting: Podiatry

## 2022-08-02 VITALS — BP 129/77 | HR 85

## 2022-08-02 DIAGNOSIS — M7751 Other enthesopathy of right foot: Secondary | ICD-10-CM | POA: Diagnosis not present

## 2022-08-02 DIAGNOSIS — M109 Gout, unspecified: Secondary | ICD-10-CM | POA: Diagnosis not present

## 2022-08-02 DIAGNOSIS — F418 Other specified anxiety disorders: Secondary | ICD-10-CM

## 2022-08-02 NOTE — Progress Notes (Signed)
Subjective:   Patient ID: Victor [name], male   DOB: 62 y.o.   MRN: [id number]   HPI Patient states is feeling quite a bit better he like to go back to eating some normal foods and he wants to review his blood work   ROS      Objective:  Physical Exam  Neurovascular status intact with patient found to have reduced inflammation around the first MPJ right mild swelling but much better than it was previously minimal discomfort within the joint surface and is noted to have blood work that has returned     Assessment:  Inflammatory capsulitis first MPJ right still strong possibility of some gouty-like symptomatology     Plan:  H&P I reviewed condition at great length I did go over his blood work and at this point I do not recommend medications but very good chance he will need them and we did discuss foods that still like him to be very careful but he can resume some normal foods gradually introducing them and if we do see repeated gout attacks we will have to make quite a bit of change.  Patient was educated on all this and understands gout understands capsulitis and shoe gear choices for him to make

## 2022-08-09 ENCOUNTER — Encounter: Payer: Self-pay | Admitting: Registered Nurse

## 2022-08-09 ENCOUNTER — Encounter: Payer: Medicare HMO | Attending: Registered Nurse | Admitting: Registered Nurse

## 2022-08-09 VITALS — BP 131/84 | HR 82 | Wt 269.0 lb

## 2022-08-09 DIAGNOSIS — F418 Other specified anxiety disorders: Secondary | ICD-10-CM | POA: Diagnosis not present

## 2022-08-09 DIAGNOSIS — M25562 Pain in left knee: Secondary | ICD-10-CM | POA: Insufficient documentation

## 2022-08-09 DIAGNOSIS — Z79891 Long term (current) use of opiate analgesic: Secondary | ICD-10-CM | POA: Insufficient documentation

## 2022-08-09 DIAGNOSIS — S8412XS Injury of peroneal nerve at lower leg level, left leg, sequela: Secondary | ICD-10-CM | POA: Diagnosis not present

## 2022-08-09 DIAGNOSIS — G8929 Other chronic pain: Secondary | ICD-10-CM | POA: Diagnosis present

## 2022-08-09 DIAGNOSIS — G894 Chronic pain syndrome: Secondary | ICD-10-CM | POA: Insufficient documentation

## 2022-08-09 DIAGNOSIS — Z5181 Encounter for therapeutic drug level monitoring: Secondary | ICD-10-CM | POA: Diagnosis present

## 2022-08-09 MED ORDER — PREGABALIN 150 MG PO CAPS: 150.0000 mg | ORAL_CAPSULE | Freq: Three times a day (TID) | ORAL | 3 refills | Status: DC

## 2022-08-09 MED ORDER — OXYCODONE HCL 15 MG PO TABS: 15.0000 mg | ORAL_TABLET | Freq: Three times a day (TID) | ORAL | 0 refills | Status: DC | PRN
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
## 2022-10-05 ENCOUNTER — Encounter: Payer: Medicare HMO | Attending: Registered Nurse | Admitting: Registered Nurse

## 2022-10-05 ENCOUNTER — Encounter: Payer: Self-pay | Admitting: Registered Nurse

## 2022-10-05 VITALS — BP 136/94 | HR 85 | Ht 72.0 in | Wt 282.0 lb

## 2022-10-05 DIAGNOSIS — M25562 Pain in left knee: Secondary | ICD-10-CM | POA: Diagnosis present

## 2022-10-05 DIAGNOSIS — G894 Chronic pain syndrome: Secondary | ICD-10-CM

## 2022-10-05 DIAGNOSIS — G8929 Other chronic pain: Secondary | ICD-10-CM

## 2022-10-05 DIAGNOSIS — Z79891 Long term (current) use of opiate analgesic: Secondary | ICD-10-CM

## 2022-10-05 DIAGNOSIS — F418 Other specified anxiety disorders: Secondary | ICD-10-CM | POA: Diagnosis present

## 2022-10-05 DIAGNOSIS — Z5181 Encounter for therapeutic drug level monitoring: Secondary | ICD-10-CM

## 2022-10-05 DIAGNOSIS — S8412XS Injury of peroneal nerve at lower leg level, left leg, sequela: Secondary | ICD-10-CM | POA: Diagnosis present

## 2022-10-05 MED ORDER — OXYCODONE HCL 15 MG PO TABS: 15.0000 mg | ORAL_TABLET | Freq: Three times a day (TID) | ORAL | 0 refills | Status: DC | PRN

## 2022-10-05 MED ORDER — ALPRAZOLAM 0.5 MG PO TABS
0.5000 mg | ORAL_TABLET | Freq: Two times a day (BID) | ORAL | 1 refills | Status: DC | PRN
Start: 2022-10-05 — End: 2022-12-03
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
## 2022-12-03 ENCOUNTER — Encounter: Payer: Self-pay | Admitting: Registered Nurse

## 2022-12-03 ENCOUNTER — Encounter: Payer: Medicare HMO | Attending: Registered Nurse | Admitting: Registered Nurse

## 2022-12-03 VITALS — BP 137/99 | HR 83 | Ht 72.0 in | Wt 286.2 lb

## 2022-12-03 DIAGNOSIS — G8929 Other chronic pain: Secondary | ICD-10-CM | POA: Insufficient documentation

## 2022-12-03 DIAGNOSIS — Z5181 Encounter for therapeutic drug level monitoring: Secondary | ICD-10-CM | POA: Diagnosis present

## 2022-12-03 DIAGNOSIS — M25562 Pain in left knee: Secondary | ICD-10-CM | POA: Diagnosis present

## 2022-12-03 DIAGNOSIS — F418 Other specified anxiety disorders: Secondary | ICD-10-CM | POA: Diagnosis present

## 2022-12-03 DIAGNOSIS — Z79891 Long term (current) use of opiate analgesic: Secondary | ICD-10-CM | POA: Insufficient documentation

## 2022-12-03 DIAGNOSIS — G894 Chronic pain syndrome: Secondary | ICD-10-CM | POA: Diagnosis present

## 2022-12-03 DIAGNOSIS — S8412XS Injury of peroneal nerve at lower leg level, left leg, sequela: Secondary | ICD-10-CM | POA: Diagnosis present

## 2022-12-03 MED ORDER — OXYCODONE HCL 15 MG PO TABS: 15.0000 mg | ORAL_TABLET | Freq: Three times a day (TID) | ORAL | 0 refills | Status: DC | PRN

## 2022-12-03 MED ORDER — ALPRAZOLAM 0.5 MG PO TABS
0.5000 mg | ORAL_TABLET | Freq: Two times a day (BID) | ORAL | 1 refills | Status: AC | PRN
Start: 2022-12-03 — End: ?
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
## 2022-12-06 ENCOUNTER — Encounter: Payer: Medicare HMO | Admitting: Registered Nurse

## 2023-01-01 ENCOUNTER — Telehealth: Payer: Self-pay | Admitting: Registered Nurse

## 2023-01-01 ENCOUNTER — Other Ambulatory Visit: Payer: Self-pay

## 2023-01-01 MED ORDER — OXYCODONE HCL 15 MG PO TABS: 15.0000 mg | ORAL_TABLET | Freq: Three times a day (TID) | ORAL | 0 refills | Status: DC | PRN

## 2023-01-01 NOTE — Telephone Encounter (Signed)
Victor [name] is going out of town tomorrow. He is requesting the Oxycodone 15 MG to be sent to [facility] on [address].

## 2023-01-01 NOTE — Telephone Encounter (Signed)
PMP was reviewed.  Oxycodone e-scribed today.  Call placed to Mr. Rodger regarding the above, he verbalizes understanding.

## 2023-01-28 ENCOUNTER — Encounter: Payer: Self-pay | Admitting: Registered Nurse

## 2023-01-28 ENCOUNTER — Encounter: Payer: Medicare HMO | Attending: Registered Nurse | Admitting: Registered Nurse

## 2023-01-28 VITALS — BP 136/86 | HR 85 | Ht 72.0 in | Wt 284.0 lb

## 2023-01-28 DIAGNOSIS — Z5181 Encounter for therapeutic drug level monitoring: Secondary | ICD-10-CM | POA: Insufficient documentation

## 2023-01-28 DIAGNOSIS — G894 Chronic pain syndrome: Secondary | ICD-10-CM | POA: Diagnosis present

## 2023-01-28 DIAGNOSIS — G8929 Other chronic pain: Secondary | ICD-10-CM | POA: Insufficient documentation

## 2023-01-28 DIAGNOSIS — Z79891 Long term (current) use of opiate analgesic: Secondary | ICD-10-CM | POA: Insufficient documentation

## 2023-01-28 DIAGNOSIS — S8412XS Injury of peroneal nerve at lower leg level, left leg, sequela: Secondary | ICD-10-CM | POA: Insufficient documentation

## 2023-01-28 DIAGNOSIS — M25572 Pain in left ankle and joints of left foot: Secondary | ICD-10-CM | POA: Diagnosis not present

## 2023-01-28 DIAGNOSIS — M25562 Pain in left knee: Secondary | ICD-10-CM | POA: Diagnosis present

## 2023-01-28 DIAGNOSIS — M1732 Unilateral post-traumatic osteoarthritis, left knee: Secondary | ICD-10-CM | POA: Insufficient documentation

## 2023-01-28 DIAGNOSIS — F418 Other specified anxiety disorders: Secondary | ICD-10-CM | POA: Diagnosis not present

## 2023-01-28 MED ORDER — OXYCODONE HCL 15 MG PO TABS: 15.0000 mg | ORAL_TABLET | Freq: Three times a day (TID) | ORAL | 0 refills | Status: DC | PRN

## 2023-01-28 MED ORDER — PREGABALIN 150 MG PO CAPS: 150.0000 mg | ORAL_CAPSULE | Freq: Three times a day (TID) | ORAL | 3 refills | Status: DC
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
## 2023-02-01 ENCOUNTER — Other Ambulatory Visit: Payer: Self-pay | Admitting: Registered Nurse

## 2023-02-01 DIAGNOSIS — F418 Other specified anxiety disorders: Secondary | ICD-10-CM

## 2023-02-05 ENCOUNTER — Telehealth: Payer: Self-pay | Admitting: Registered Nurse

## 2023-02-05 DIAGNOSIS — F418 Other specified anxiety disorders: Secondary | ICD-10-CM

## 2023-02-05 MED ORDER — ALPRAZOLAM 0.5 MG PO TABS: 0.5000 mg | ORAL_TABLET | Freq: Two times a day (BID) | ORAL | 1 refills | Status: DC | PRN

## 2023-02-05 NOTE — Telephone Encounter (Signed)
PMP was Reviewed.  Alprazolam e-scribed to pharmacy. Mr. Brunty is aware via My-Chart message.

## 2023-02-12 ENCOUNTER — Other Ambulatory Visit: Payer: Self-pay | Admitting: Gastroenterology

## 2023-03-13 ENCOUNTER — Encounter (HOSPITAL_COMMUNITY): Admission: RE | Payer: Self-pay | Source: Home / Self Care

## 2023-03-13 ENCOUNTER — Ambulatory Visit (HOSPITAL_COMMUNITY): Admission: RE | Admit: 2023-03-13 | Payer: Medicare HMO | Source: Home / Self Care | Admitting: Gastroenterology

## 2023-03-13 SURGERY — COLONOSCOPY WITH PROPOFOL
Anesthesia: Monitor Anesthesia Care | Laterality: Bilateral

## 2023-03-28 ENCOUNTER — Encounter: Payer: Self-pay | Admitting: Podiatry

## 2023-03-28 ENCOUNTER — Ambulatory Visit (INDEPENDENT_AMBULATORY_CARE_PROVIDER_SITE_OTHER): Payer: Medicare HMO | Admitting: Podiatry

## 2023-03-28 ENCOUNTER — Ambulatory Visit (INDEPENDENT_AMBULATORY_CARE_PROVIDER_SITE_OTHER): Payer: Medicare HMO

## 2023-03-28 DIAGNOSIS — M7672 Peroneal tendinitis, left leg: Secondary | ICD-10-CM | POA: Diagnosis not present

## 2023-03-28 DIAGNOSIS — M7752 Other enthesopathy of left foot: Secondary | ICD-10-CM | POA: Diagnosis not present

## 2023-03-28 MED ORDER — TRIAMCINOLONE ACETONIDE 10 MG/ML IJ SUSP
10.0000 mg | Freq: Once | INTRAMUSCULAR | Status: AC
  Administered 2023-03-28: 10 mg via INTRA_ARTICULAR

## 2023-03-28 NOTE — Progress Notes (Signed)
Subjective:   Patient ID: Victor [name], male   DOB: 62 y.o.   MRN: [id number]   HPI Patient presents with a lot of pain on the outside of his left foot   ROS      Objective:  Physical Exam  Neurovascular status intact inflammation pain on the outside of the left foot around the sinus tarsi and distal with history of gout     Assessment:  Possibility for gout possibility for tendinitis left     Plan:  H&P reviewed I went ahead today I did sterile prep I injected the tenderness complex after explaining risk 3 mg Dexasone Kenalog 5 mg Xylocaine applied sterile dressing.  If the symptoms do not get better I want to see back may need to get blood work may need to treat for other condition  X-rays were negative for signs of fracture or bony injury associated with condition

## 2023-04-01 ENCOUNTER — Encounter: Payer: Self-pay | Admitting: Registered Nurse

## 2023-04-01 ENCOUNTER — Encounter: Payer: Medicare HMO | Attending: Registered Nurse | Admitting: Registered Nurse

## 2023-04-01 VITALS — BP 127/84 | HR 77 | Ht 72.0 in | Wt 285.0 lb

## 2023-04-01 DIAGNOSIS — S8412XS Injury of peroneal nerve at lower leg level, left leg, sequela: Secondary | ICD-10-CM | POA: Diagnosis not present

## 2023-04-01 DIAGNOSIS — M542 Cervicalgia: Secondary | ICD-10-CM | POA: Insufficient documentation

## 2023-04-01 DIAGNOSIS — M1732 Unilateral post-traumatic osteoarthritis, left knee: Secondary | ICD-10-CM | POA: Insufficient documentation

## 2023-04-01 DIAGNOSIS — M25562 Pain in left knee: Secondary | ICD-10-CM | POA: Insufficient documentation

## 2023-04-01 DIAGNOSIS — F418 Other specified anxiety disorders: Secondary | ICD-10-CM | POA: Insufficient documentation

## 2023-04-01 DIAGNOSIS — G8929 Other chronic pain: Secondary | ICD-10-CM | POA: Insufficient documentation

## 2023-04-01 DIAGNOSIS — M25572 Pain in left ankle and joints of left foot: Secondary | ICD-10-CM | POA: Insufficient documentation

## 2023-04-01 DIAGNOSIS — G894 Chronic pain syndrome: Secondary | ICD-10-CM | POA: Diagnosis not present

## 2023-04-01 DIAGNOSIS — Z79891 Long term (current) use of opiate analgesic: Secondary | ICD-10-CM | POA: Insufficient documentation

## 2023-04-01 DIAGNOSIS — Z5181 Encounter for therapeutic drug level monitoring: Secondary | ICD-10-CM | POA: Diagnosis not present

## 2023-04-01 MED ORDER — OXYCODONE HCL 15 MG PO TABS: 15.0000 mg | ORAL_TABLET | Freq: Three times a day (TID) | ORAL | 0 refills | Status: DC | PRN

## 2023-04-01 MED ORDER — ALPRAZOLAM 0.5 MG PO TABS: 0.5000 mg | ORAL_TABLET | Freq: Two times a day (BID) | ORAL | 1 refills | Status: DC | PRN
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
## 2023-04-04 LAB — TOXASSURE SELECT,+ANTIDEPR,UR

## 2023-04-08 ENCOUNTER — Telehealth: Payer: Self-pay | Admitting: Registered Nurse

## 2023-04-08 NOTE — Telephone Encounter (Signed)
UDS results was reviewed.  + Hydrocodone, call placed to Victor [name], he admits to taking his wife hydrocodone, he reports he was in excruciating pain. We reviewed the narcotic policy, he is aware if this occurs again, can lead to be discharging from our office. He verbalizes understanding. He was also instructed to call office if he has a change in his pain, he verbalizes understanding.

## 2023-04-12 ENCOUNTER — Other Ambulatory Visit: Payer: Self-pay | Admitting: Gastroenterology

## 2023-04-15 ENCOUNTER — Telehealth: Payer: Self-pay

## 2023-04-15 NOTE — Telephone Encounter (Signed)
Can you provide me with a days worth of Pregabalin and Oxycodone until my medication I left in [place] arrives? Thank you   I have spoken with Mr. Peaster he think his medication will be here tomorrow by mail. If not he has been advised to call back.  Riley Lam NP is out of the office today).

## 2023-04-19 ENCOUNTER — Encounter: Payer: Self-pay | Admitting: Podiatry

## 2023-04-19 ENCOUNTER — Ambulatory Visit (INDEPENDENT_AMBULATORY_CARE_PROVIDER_SITE_OTHER): Payer: Medicare HMO | Admitting: Podiatry

## 2023-04-19 ENCOUNTER — Ambulatory Visit: Payer: Medicare HMO

## 2023-04-19 DIAGNOSIS — M76822 Posterior tibial tendinitis, left leg: Secondary | ICD-10-CM

## 2023-04-19 DIAGNOSIS — M778 Other enthesopathies, not elsewhere classified: Secondary | ICD-10-CM

## 2023-04-19 MED ORDER — TRIAMCINOLONE ACETONIDE 10 MG/ML IJ SUSP
10.0000 mg | Freq: Once | INTRAMUSCULAR | Status: AC
  Administered 2023-04-19: 10 mg via INTRA_ARTICULAR

## 2023-04-22 NOTE — Progress Notes (Signed)
Subjective:   Patient ID: Victor [name], male   DOB: 62 y.o.   MRN: [id number]   HPI Patient presents stating the outside of my ankle is doing well but the inside of my ankle has been sore and inflamed and I do not know what may have happened   ROS      Objective:  Physical Exam  Neurovascular status intact with inflammation of the inside of the left ankle posterior tibial tendon as it comes under the medial malleolus     Assessment:  Posterior tibial tendinitis left inflammation fluid around the area     Plan:  H&P reviewed sterile prep injected the sheath of the tendon 3 mg Dexasone Kenalog 5 mg Xylocaine and patient will be seen back as needed risk assessment prior to doing this and patient wanted the injection.  Reappoint to recheck

## 2023-05-03 IMAGING — CR DG CHEST 2V
2 series · 2 of 2 positions shown · non-contrast
Comparison: 04/26/2021

CLINICAL DATA: Leaking PICC line and weakness

EXAM:
CHEST - 2 VIEW

[chest lat]
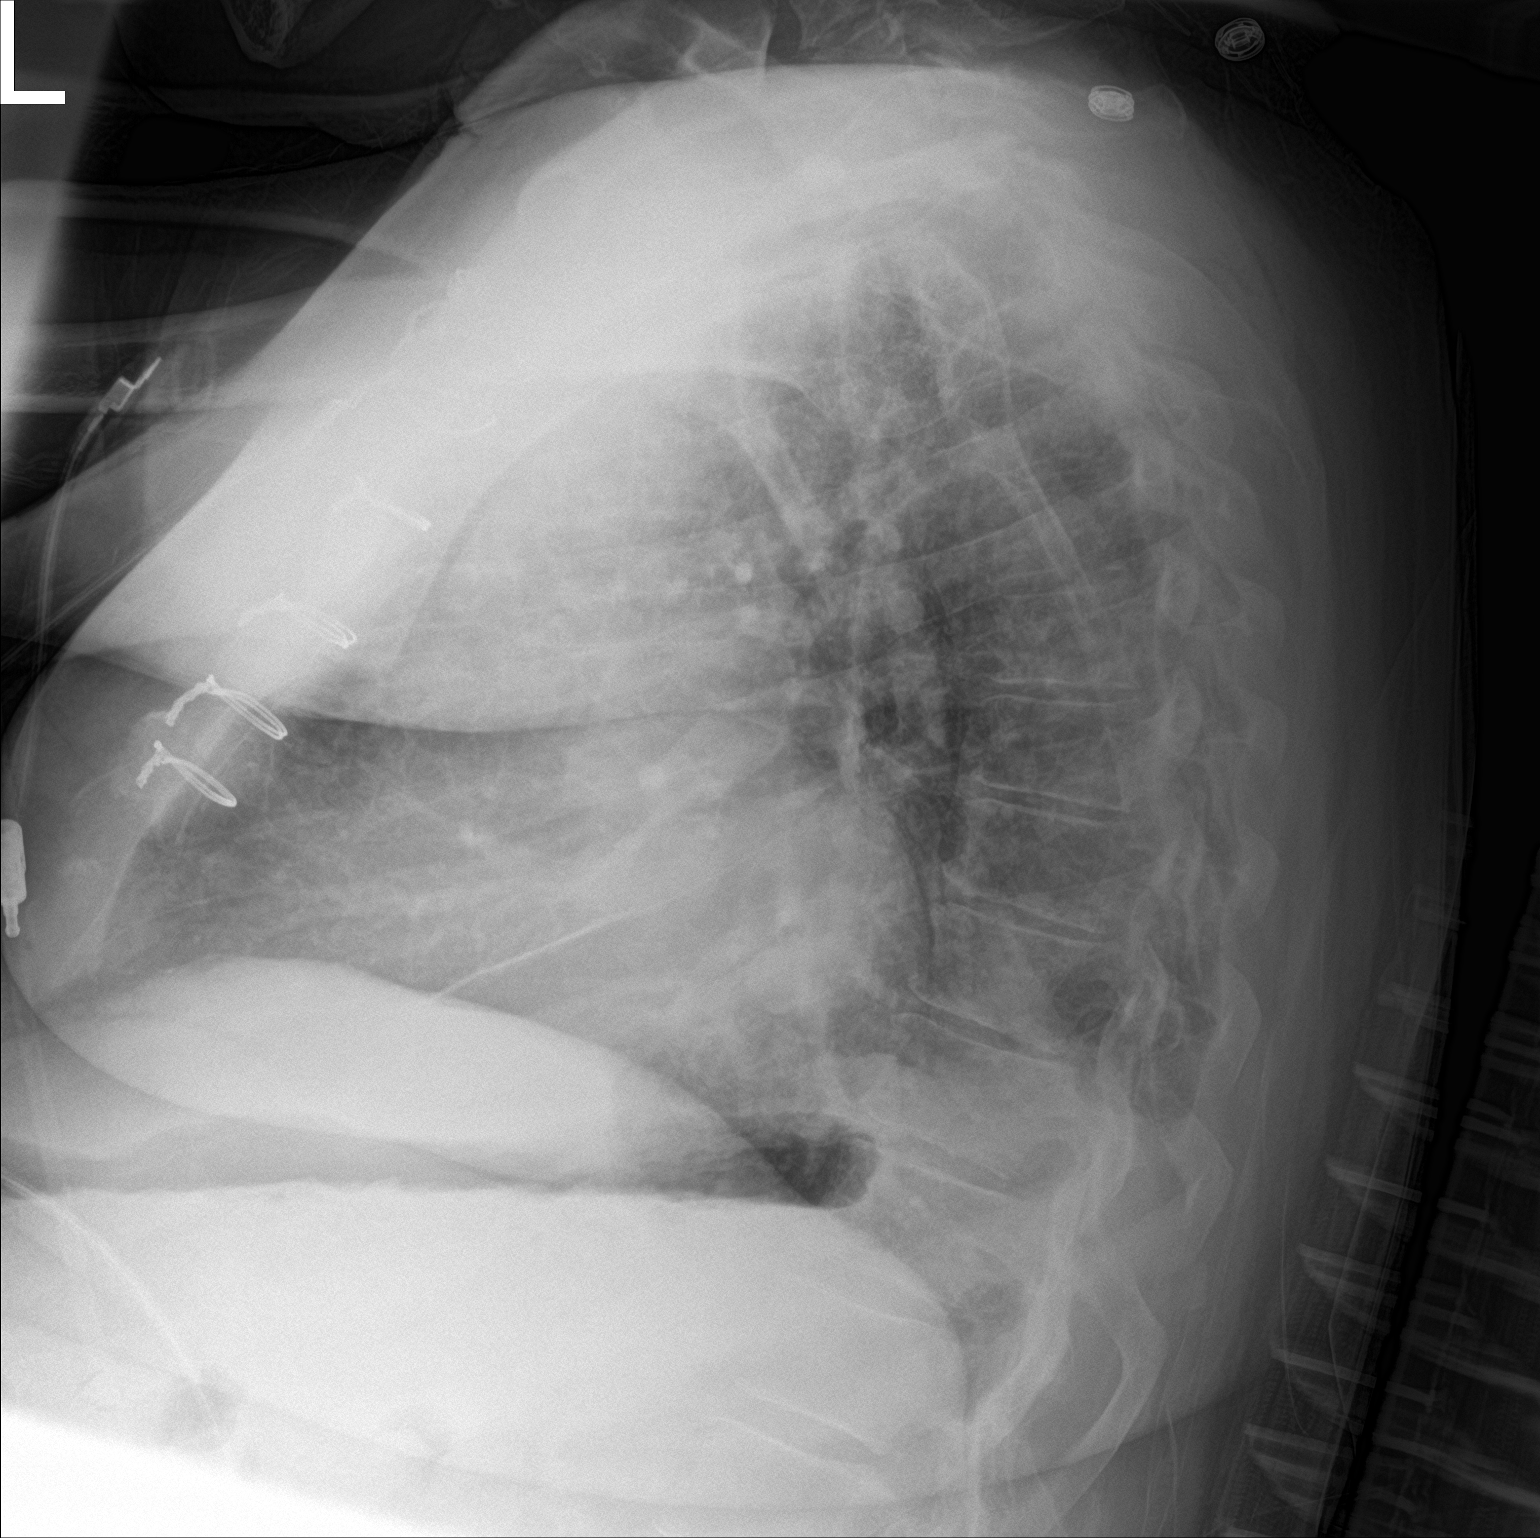

[chest ap]
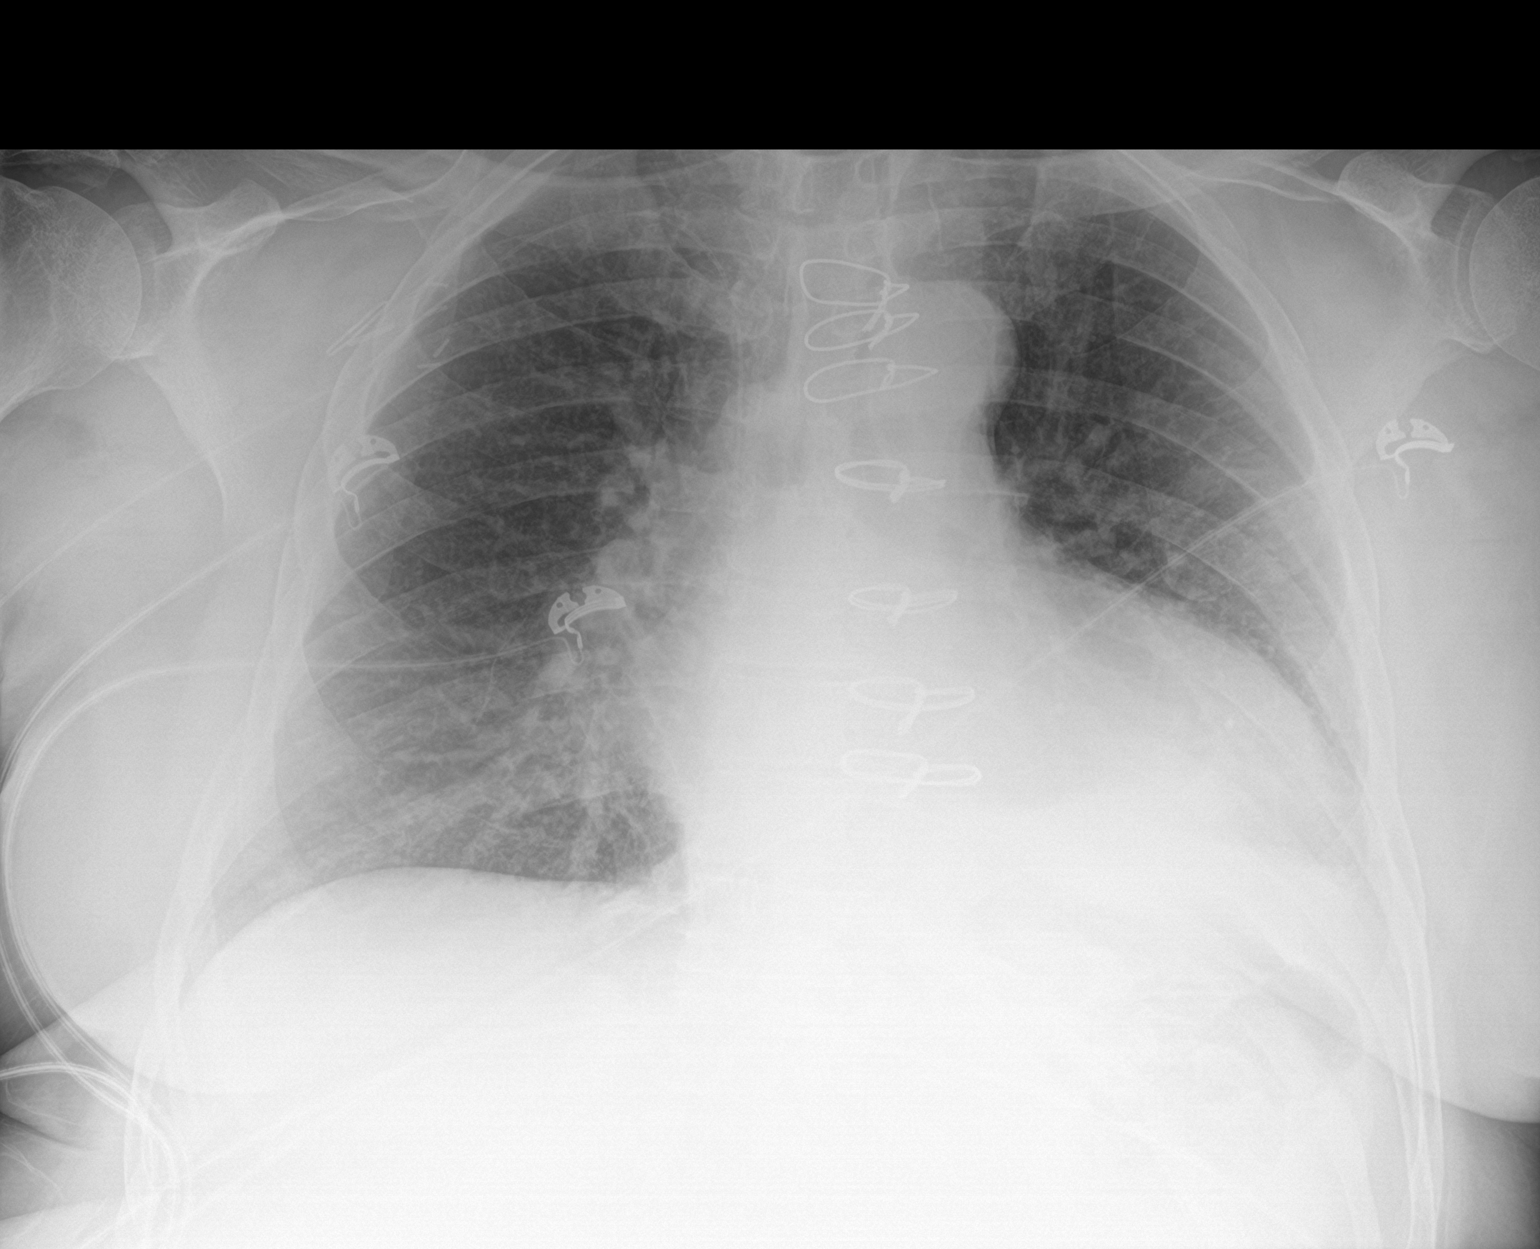

[2 of 2 positions shown; findings below may reference images not displayed]

FINDINGS: Cardiac shadow is enlarged. Postsurgical changes are noted new from
the prior exam. Right-sided PICC is noted extending to the
cavoatrial junction. Postsurgical changes in the right subclavian
region are seen. The lungs are clear. No bony abnormality is seen.
IMPRESSION: No acute abnormality noted.

## 2023-05-27 ENCOUNTER — Encounter: Payer: Medicare HMO | Attending: Registered Nurse | Admitting: Registered Nurse

## 2023-05-27 ENCOUNTER — Encounter: Payer: Self-pay | Admitting: Registered Nurse

## 2023-05-27 VITALS — BP 138/83 | HR 93 | Ht 72.0 in | Wt 289.0 lb

## 2023-05-27 DIAGNOSIS — G8929 Other chronic pain: Secondary | ICD-10-CM | POA: Insufficient documentation

## 2023-05-27 DIAGNOSIS — G894 Chronic pain syndrome: Secondary | ICD-10-CM | POA: Insufficient documentation

## 2023-05-27 DIAGNOSIS — M25562 Pain in left knee: Secondary | ICD-10-CM | POA: Diagnosis present

## 2023-05-27 DIAGNOSIS — M25572 Pain in left ankle and joints of left foot: Secondary | ICD-10-CM | POA: Insufficient documentation

## 2023-05-27 DIAGNOSIS — S8412XS Injury of peroneal nerve at lower leg level, left leg, sequela: Secondary | ICD-10-CM | POA: Insufficient documentation

## 2023-05-27 DIAGNOSIS — F418 Other specified anxiety disorders: Secondary | ICD-10-CM | POA: Insufficient documentation

## 2023-05-27 DIAGNOSIS — Z79891 Long term (current) use of opiate analgesic: Secondary | ICD-10-CM | POA: Insufficient documentation

## 2023-05-27 DIAGNOSIS — Z5181 Encounter for therapeutic drug level monitoring: Secondary | ICD-10-CM | POA: Diagnosis present

## 2023-05-27 MED ORDER — ALPRAZOLAM 0.5 MG PO TABS: 0.5000 mg | ORAL_TABLET | Freq: Two times a day (BID) | ORAL | 1 refills | Status: DC | PRN

## 2023-05-27 MED ORDER — OXYCODONE HCL 15 MG PO TABS: 15.0000 mg | ORAL_TABLET | Freq: Three times a day (TID) | ORAL | 0 refills | Status: DC | PRN

## 2023-05-27 MED ORDER — PREGABALIN 150 MG PO CAPS: 150.0000 mg | ORAL_CAPSULE | Freq: Three times a day (TID) | ORAL | 3 refills | Status: DC
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
## 2023-06-19 ENCOUNTER — Encounter (HOSPITAL_COMMUNITY): Payer: Self-pay | Admitting: Gastroenterology

## 2023-06-19 NOTE — Progress Notes (Signed)
Pre op call eval Name:  Victor [name]   PCP-Noelle Redmon PA  Cardiologist-[place] at [facility]  EKG- 09/08/21 Echo-02/26/23 Stress Test-05/04/22 Cath-n/a Blood thinner-n/a GLP-1-n/a   Hx: CHF,HTN,Asthma,Cardiomyopathy, Heart Transplant at [facility] 2023, DM. Patient sees cardiology through [facility], last visit 02/26/23 is due to see them 06/24/23 at that appt he says he does have a "scan" but not sure what it is called or what its for, thinks just routine, not having any cardiac issues. No equipment use.  Anesthesia Review: Yes

## 2023-06-26 ENCOUNTER — Encounter (HOSPITAL_COMMUNITY)
Admission: RE | Disposition: A | Discharge status: Home / Self Care | Payer: Self-pay | Source: Ambulatory Visit | Attending: Gastroenterology

## 2023-06-26 ENCOUNTER — Ambulatory Visit (HOSPITAL_BASED_OUTPATIENT_CLINIC_OR_DEPARTMENT_OTHER): Payer: Self-pay | Admitting: Anesthesiology

## 2023-06-26 ENCOUNTER — Other Ambulatory Visit: Payer: Self-pay

## 2023-06-26 ENCOUNTER — Ambulatory Visit (HOSPITAL_COMMUNITY): Payer: Self-pay | Admitting: Anesthesiology

## 2023-06-26 ENCOUNTER — Encounter (HOSPITAL_COMMUNITY): Payer: Self-pay | Admitting: Gastroenterology

## 2023-06-26 ENCOUNTER — Ambulatory Visit (HOSPITAL_COMMUNITY)
Admission: RE | Admit: 2023-06-26 | Discharge: 2023-06-26 | Disposition: A | Discharge status: Home / Self Care | Payer: Medicare HMO | Source: Ambulatory Visit | Attending: Gastroenterology | Admitting: Gastroenterology

## 2023-06-26 DIAGNOSIS — K648 Other hemorrhoids: Secondary | ICD-10-CM

## 2023-06-26 DIAGNOSIS — Z1211 Encounter for screening for malignant neoplasm of colon: Secondary | ICD-10-CM | POA: Diagnosis present

## 2023-06-26 DIAGNOSIS — Z941 Heart transplant status: Secondary | ICD-10-CM | POA: Insufficient documentation

## 2023-06-26 DIAGNOSIS — E66813 Obesity, class 3: Secondary | ICD-10-CM | POA: Insufficient documentation

## 2023-06-26 DIAGNOSIS — Z6836 Body mass index (BMI) 36.0-36.9, adult: Secondary | ICD-10-CM | POA: Insufficient documentation

## 2023-06-26 DIAGNOSIS — E785 Hyperlipidemia, unspecified: Secondary | ICD-10-CM

## 2023-06-26 DIAGNOSIS — I5022 Chronic systolic (congestive) heart failure: Secondary | ICD-10-CM | POA: Diagnosis not present

## 2023-06-26 DIAGNOSIS — I4891 Unspecified atrial fibrillation: Secondary | ICD-10-CM

## 2023-06-26 DIAGNOSIS — Z79899 Other long term (current) drug therapy: Secondary | ICD-10-CM | POA: Diagnosis not present

## 2023-06-26 DIAGNOSIS — J45909 Unspecified asthma, uncomplicated: Secondary | ICD-10-CM | POA: Diagnosis not present

## 2023-06-26 DIAGNOSIS — I110 Hypertensive heart disease with heart failure: Secondary | ICD-10-CM | POA: Diagnosis not present

## 2023-06-26 DIAGNOSIS — Z860100 Personal history of colon polyps, unspecified: Secondary | ICD-10-CM | POA: Diagnosis not present

## 2023-06-26 HISTORY — PX: COLONOSCOPY WITH PROPOFOL: SHX5780

## 2023-06-26 LAB — GLUCOSE, CAPILLARY: Glucose-Capillary: 217 mg/dL — ABNORMAL HIGH (ref 70–99)

## 2023-06-26 SURGERY — COLONOSCOPY WITH PROPOFOL
Anesthesia: Monitor Anesthesia Care | Laterality: Bilateral

## 2023-06-26 MED ORDER — PHENYLEPHRINE HCL (PRESSORS) 10 MG/ML IV SOLN
INTRAVENOUS | Status: AC
  Filled 2023-06-26: qty 1

## 2023-06-26 MED ORDER — SODIUM CHLORIDE 0.9 % IV SOLN
INTRAVENOUS | Status: DC | PRN
  Administered 2023-06-26: 1000 mL via INTRAVENOUS

## 2023-06-26 MED ORDER — PROPOFOL 10 MG/ML IV BOLUS
INTRAVENOUS | Status: AC
  Filled 2023-06-26: qty 20

## 2023-06-26 MED ORDER — PHENYLEPHRINE HCL-NACL 20-0.9 MG/250ML-% IV SOLN
INTRAVENOUS | Status: DC | PRN
  Administered 2023-06-26: 35 ug/min via INTRAVENOUS

## 2023-06-26 MED ORDER — PROPOFOL 500 MG/50ML IV EMUL
INTRAVENOUS | Status: DC | PRN
  Administered 2023-06-26: 75 ug/kg/min via INTRAVENOUS

## 2023-06-26 MED ORDER — PROPOFOL 10 MG/ML IV BOLUS
INTRAVENOUS | Status: DC | PRN
  Administered 2023-06-26 (×3): 20 mg via INTRAVENOUS

## 2023-06-26 MED ORDER — PHENYLEPHRINE 80 MCG/ML (10ML) SYRINGE FOR IV PUSH (FOR BLOOD PRESSURE SUPPORT)
PREFILLED_SYRINGE | INTRAVENOUS | Status: DC | PRN
  Administered 2023-06-26: 80 ug via INTRAVENOUS

## 2023-06-26 MED ORDER — OXYCODONE HCL 5 MG/5ML PO SOLN: 5.0000 mg | Freq: Once | ORAL | Status: DC | PRN

## 2023-06-26 MED ORDER — VASOPRESSIN 20 UNIT/ML IV SOLN
INTRAVENOUS | Status: AC
  Filled 2023-06-26: qty 1

## 2023-06-26 MED ORDER — LIDOCAINE 2% (20 MG/ML) 5 ML SYRINGE
INTRAMUSCULAR | Status: DC | PRN
  Administered 2023-06-26: 60 mg via INTRAVENOUS

## 2023-06-26 MED ORDER — SODIUM CHLORIDE 0.9 % IV SOLN: 12.5000 mg | INTRAVENOUS | Status: DC | PRN

## 2023-06-26 MED ORDER — OXYCODONE HCL 5 MG PO TABS: 5.0000 mg | ORAL_TABLET | Freq: Once | ORAL | Status: DC | PRN

## 2023-06-26 MED ORDER — SODIUM CHLORIDE 0.9 % IV SOLN
INTRAVENOUS | Status: DC
Start: 2023-06-26 — End: 2023-06-26

## 2023-06-26 MED ORDER — PROPOFOL 1000 MG/100ML IV EMUL
INTRAVENOUS | Status: AC
  Filled 2023-06-26: qty 100

## 2023-06-26 SURGICAL SUPPLY — 20 items
ELECT REM PT RETURN 9FT ADLT (ELECTROSURGICAL) IMPLANT
ELECTRODE REM PT RTRN 9FT ADLT (ELECTROSURGICAL) IMPLANT
FLOOR PAD 36X40 (MISCELLANEOUS) ×1 IMPLANT
FORCEPS BIOP RAD 4 LRG CAP 4 (CUTTING FORCEPS) IMPLANT
FORCEPS BIOP RJ4 240 W/NDL (CUTTING FORCEPS) IMPLANT
FORCEPS BXJMBJMB 240X2.8X (CUTTING FORCEPS) IMPLANT
INJECTOR/SNARE I SNARE (MISCELLANEOUS) IMPLANT
LUBRICANT JELLY 4.5OZ STERILE (MISCELLANEOUS) IMPLANT
MANIFOLD NEPTUNE II (INSTRUMENTS) IMPLANT
NDL SCLEROTHERAPY 25GX240 (NEEDLE) IMPLANT
NEEDLE SCLEROTHERAPY 25GX240 (NEEDLE) IMPLANT
PAD FLOOR 36X40 (MISCELLANEOUS) ×1 IMPLANT
PROBE APC STR FIRE (PROBE) IMPLANT
PROBE INJECTION GOLD 7FR (MISCELLANEOUS) IMPLANT
SNARE ROTATE MED OVAL 20MM (MISCELLANEOUS) IMPLANT
SYR 50ML LL SCALE MARK (SYRINGE) IMPLANT
TRAP SPECIMEN MUCOUS 40CC (MISCELLANEOUS) IMPLANT
TUBING ENDO SMARTCAP PENTAX (MISCELLANEOUS) IMPLANT
TUBING IRRIGATION ENDOGATOR (MISCELLANEOUS) ×1 IMPLANT
WATER STERILE IRR 1000ML POUR (IV SOLUTION) IMPLANT

## 2023-06-26 NOTE — Op Note (Signed)
[facility] Patient Name: Victor [name] Procedure Date: 06/26/2023 MRN: [id number] Attending MD: Willis Modena , MD, [id number] Date of Birth: Jul 14, 1960 CSN: [id number] Age: 63 Admit Type: Outpatient Procedure:                Colonoscopy Indications:              High risk colon cancer surveillance: Personal                            history of colonic polyps, Last colonoscopy: 2017 Providers:                Willis Modena, MD, Fransisca Connors, Rhodia Albright,                            Technician Referring MD:              Medicines:                Monitored Anesthesia Care Complications:            No immediate complications. Estimated Blood Loss:     Estimated blood loss: none. Procedure:                Pre-Anesthesia Assessment:                           - Prior to the procedure, a History and Physical                            was performed, and patient medications and                            allergies were reviewed. The patient's tolerance of                            previous anesthesia was also reviewed. The risks                            and benefits of the procedure and the sedation                            options and risks were discussed with the patient.                            All questions were answered, and informed consent                            was obtained. Prior Anticoagulants: The patient has                            taken no anticoagulant or antiplatelet agents                            except for aspirin. ASA Grade Assessment: IV - A  patient with severe systemic disease that is a                            constant threat to life. After reviewing the risks                            and benefits, the patient was deemed in                            satisfactory condition to undergo the procedure.                           After obtaining informed consent, the colonoscope                             was passed under direct vision. Throughout the                            procedure, the patient's blood pressure, pulse, and                            oxygen saturations were monitored continuously. The                            CF-HQ190L ([id number]) Olympus colonoscope was                            introduced through the anus and advanced to the the                            cecum, identified by appendiceal orifice and                            ileocecal valve. The ileocecal valve, appendiceal                            orifice, and rectum were photographed. The entire                            colon was examined. The colonoscopy was performed                            without difficulty. The patient tolerated the                            procedure well. The quality of the bowel                            preparation was adequate. Scope In: 8:48:34 AM Scope Out: 9:00:02 AM Scope Withdrawal Time: 0 hours 9 minutes 6 seconds  Total Procedure Duration: 0 hours 11 minutes 28 seconds  Findings:      The perianal and digital rectal examinations were normal.      Internal hemorrhoids were found during retroflexion. The hemorrhoids  were moderate.      The exam was otherwise without abnormality on direct and retroflexion       views. Impression:               - Internal hemorrhoids.                           - The examination was otherwise normal on direct                            and retroflexion views. Moderate Sedation:      Not Applicable - Patient had care per Anesthesia. Recommendation:           - Patient has a contact number available for                            emergencies. The signs and symptoms of potential                            delayed complications were discussed with the                            patient. Return to normal activities tomorrow.                            Written discharge instructions were provided to the                             patient.                           - Discharge patient to home (via wheelchair).                           - High fiber diet indefinitely.                           - Continue present medications.                           - Repeat colonoscopy in 5 years for surveillance.                           - Return to GI clinic PRN.                           - Return to referring physician as previously                            scheduled. Procedure Code(s):        --- Professional ---                           [phone], Colonoscopy, flexible; diagnostic, including                            collection of specimen(s) by brushing or  washing,                            when performed (separate procedure) Diagnosis Code(s):        --- Professional ---                           Z86.010, Personal history of colonic polyps                           K64.8, Other hemorrhoids CPT copyright 2022 [employer]. All rights reserved. The codes documented in this report are preliminary and upon coder review may  be revised to meet current compliance requirements. Willis Modena, MD 06/26/2023 9:07:40 AM This report has been signed electronically. Number of Addenda: 0

## 2023-06-26 NOTE — Anesthesia Preprocedure Evaluation (Addendum)
Anesthesia Evaluation  Patient identified by MRN, date of birth, ID band Patient awake    Reviewed: Allergy & Precautions, H&P , NPO status , Patient's Chart, lab work & pertinent test results, reviewed documented beta blocker date and time   Airway Mallampati: II  TM Distance: >3 FB Neck ROM: Full    Dental no notable dental hx. (+) Teeth Intact, Dental Advisory Given   Pulmonary asthma    Pulmonary exam normal breath sounds clear to auscultation       Cardiovascular Exercise Tolerance: Good hypertension, Pt. on medications and Pt. on home beta blockers +CHF   Rhythm:Regular Rate:Normal     Neuro/Psych   Anxiety     negative neurological ROS     GI/Hepatic negative GI ROS, Neg liver ROS,,,  Endo/Other    Class 3 obesity  Renal/GU negative Renal ROS  negative genitourinary   Musculoskeletal  (+) Arthritis , Osteoarthritis,    Abdominal   Peds  Hematology negative hematology ROS (+)   Anesthesia Other Findings   Reproductive/Obstetrics negative OB ROS                             Anesthesia Physical Anesthesia Plan  ASA: 4  Anesthesia Plan: MAC   Post-op Pain Management:    Induction: Intravenous  PONV Risk Score and Plan: 1 and Ondansetron and Treatment may vary due to age or medical condition  Airway Management Planned: Nasal Cannula  Additional Equipment:   Intra-op Plan:   Post-operative Plan:   Informed Consent: I have reviewed the patients History and Physical, chart, labs and discussed the procedure including the risks, benefits and alternatives for the proposed anesthesia with the patient or authorized representative who has indicated his/her understanding and acceptance.     Dental advisory given  Plan Discussed with: CRNA  Anesthesia Plan Comments:         Anesthesia Quick Evaluation

## 2023-06-26 NOTE — Anesthesia Procedure Notes (Signed)
Date/Time: 06/26/2023 8:39 AM  Performed by: Florene Route, CRNAOxygen Delivery Method: Simple face mask
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
## 2023-06-26 NOTE — Transfer of Care (Signed)
Immediate Anesthesia Transfer of Care Note  Patient: Victor [name]  Procedure(s) Performed: COLONOSCOPY WITH PROPOFOL (Bilateral)  Patient Location: Endoscopy Unit  Anesthesia Type:MAC  Level of Consciousness: drowsy  Airway & Oxygen Therapy: Patient Spontanous Breathing and Patient connected to face mask oxygen  Post-op Assessment: Report given to RN and Post -op Vital signs reviewed and stable  Post vital signs: Reviewed and stable  Last Vitals:  Vitals Value Taken Time  BP 85/50 06/26/23 0910  Temp    Pulse 83 06/26/23 0911  Resp 16 06/26/23 0911  SpO2 98 % 06/26/23 0911  Vitals shown include unfiled device data.  Last Pain:  Vitals:   06/26/23 0749  TempSrc: Temporal  PainSc: 0-No pain         Complications: No notable events documented.

## 2023-06-26 NOTE — Discharge Instructions (Signed)
Colonoscopy ° °Post procedure instructions: ° °Read the instructions outlined below and refer to this sheet in the next few weeks. These discharge instructions provide you with general information on caring for yourself after you leave the hospital. Your doctor may also give you specific instructions. While your treatment has been planned according to the most current medical practices available, unavoidable complications occasionally occur. If you have any problems or questions after discharge, call Dr. Outlaw at [facility] ([phone]). ° °HOME CARE INSTRUCTIONS ° °ACTIVITY: °· You may resume your regular activity, but move at a slower pace for the next 24 hours.  °· Take frequent rest periods for the next 24 hours.  °· Walking will help get rid of the air and reduce the bloated feeling in your belly (abdomen).  °· No driving for 24 hours (because of the medicine (anesthesia) used during the test).  °· You may shower.  °· Do not sign any important legal documents or operate any machinery for 24 hours (because of the anesthesia used during the test).  °NUTRITION: °· Drink plenty of fluids.  °· You may resume your normal diet as instructed by your doctor.  °· Begin with a light meal and progress to your normal diet. Heavy or fried foods are harder to digest and may make you feel sick to your stomach (nauseated).  °· Avoid alcoholic beverages for 24 hours or as instructed.  °MEDICATIONS: °· You may resume your normal medications unless your doctor tells you otherwise.  °WHAT TO EXPECT TODAY: °· Some feelings of bloating in the abdomen.  °· Passage of more gas than usual.  °· Spotting of blood in your stool or on the toilet paper.  °IF YOU HAD POLYPS REMOVED DURING THE COLONOSCOPY: °· No aspirin products for 7 days or as instructed.  °· No alcohol for 7 days or as instructed.  °· Eat a soft diet for the next 24 hours.  ° °FINDING OUT THE RESULTS OF YOUR TEST ° °Not all test results are available during your  visit. If your test results are not back during the visit, make an appointment with your caregiver to find out the results. Do not assume everything is normal if you have not heard from your caregiver or the medical facility. It is important for you to follow up on all of your test results.  ° ° ° °SEEK IMMEDIATE MEDICAL CARE IF: ° °· You have more than a spotting of blood in your stool.  °· Your belly is swollen (abdominal distention).  °· You are nauseated or vomiting.  °· You have a fever.  °· You have abdominal pain or discomfort that is severe or gets worse throughout the day.  ° ° °Document Released: 12/06/2003 Document Revised: 01/03/2011 Document Reviewed: 12/04/2007 °Exit[employer]® Patient Information ©2012 [employer]. ° °

## 2023-06-26 NOTE — Anesthesia Postprocedure Evaluation (Signed)
Anesthesia Post Note  Patient: Victor [name]  Procedure(s) Performed: COLONOSCOPY WITH PROPOFOL (Bilateral)     Patient location during evaluation: PACU Anesthesia Type: MAC Level of consciousness: awake and alert Pain management: pain level controlled Vital Signs Assessment: post-procedure vital signs reviewed and stable Respiratory status: spontaneous breathing, nonlabored ventilation and respiratory function stable Cardiovascular status: blood pressure returned to baseline and stable Postop Assessment: no apparent nausea or vomiting Anesthetic complications: no   No notable events documented.  Last Vitals:  Vitals:   06/26/23 0924 06/26/23 0927  BP: 117/67 117/73  Pulse: 85 83  Resp: 17 14  Temp:    SpO2: 93% 93%    Last Pain:  Vitals:   06/26/23 0927  TempSrc:   PainSc: 0-No pain                 Lowella Curb

## 2023-06-28 ENCOUNTER — Encounter (HOSPITAL_COMMUNITY): Payer: Self-pay | Admitting: Gastroenterology

## 2023-07-08 ENCOUNTER — Other Ambulatory Visit: Payer: Self-pay

## 2023-07-08 ENCOUNTER — Inpatient Hospital Stay (HOSPITAL_COMMUNITY)
Admission: EM | Admit: 2023-07-08 | Discharge: 2023-07-10 | DRG: 638 | Disposition: A | Discharge status: Home / Self Care | Source: Ambulatory Visit | Attending: Internal Medicine | Admitting: Internal Medicine

## 2023-07-08 ENCOUNTER — Encounter (HOSPITAL_COMMUNITY): Payer: Self-pay

## 2023-07-08 DIAGNOSIS — J45909 Unspecified asthma, uncomplicated: Secondary | ICD-10-CM | POA: Diagnosis present

## 2023-07-08 DIAGNOSIS — Z941 Heart transplant status: Secondary | ICD-10-CM | POA: Diagnosis not present

## 2023-07-08 DIAGNOSIS — E875 Hyperkalemia: Secondary | ICD-10-CM | POA: Diagnosis present

## 2023-07-08 DIAGNOSIS — I10 Essential (primary) hypertension: Secondary | ICD-10-CM | POA: Diagnosis present

## 2023-07-08 DIAGNOSIS — B182 Chronic viral hepatitis C: Secondary | ICD-10-CM | POA: Diagnosis present

## 2023-07-08 DIAGNOSIS — Z7982 Long term (current) use of aspirin: Secondary | ICD-10-CM

## 2023-07-08 DIAGNOSIS — D751 Secondary polycythemia: Secondary | ICD-10-CM | POA: Diagnosis present

## 2023-07-08 DIAGNOSIS — I130 Hypertensive heart and chronic kidney disease with heart failure and stage 1 through stage 4 chronic kidney disease, or unspecified chronic kidney disease: Secondary | ICD-10-CM | POA: Diagnosis present

## 2023-07-08 DIAGNOSIS — E1100 Type 2 diabetes mellitus with hyperosmolarity without nonketotic hyperglycemic-hyperosmolar coma (NKHHC): Secondary | ICD-10-CM | POA: Diagnosis present

## 2023-07-08 DIAGNOSIS — E1101 Type 2 diabetes mellitus with hyperosmolarity with coma: Secondary | ICD-10-CM | POA: Diagnosis not present

## 2023-07-08 DIAGNOSIS — E1110 Type 2 diabetes mellitus with ketoacidosis without coma: Secondary | ICD-10-CM | POA: Diagnosis present

## 2023-07-08 DIAGNOSIS — G4733 Obstructive sleep apnea (adult) (pediatric): Secondary | ICD-10-CM | POA: Diagnosis present

## 2023-07-08 DIAGNOSIS — Z841 Family history of disorders of kidney and ureter: Secondary | ICD-10-CM | POA: Diagnosis not present

## 2023-07-08 DIAGNOSIS — Z79899 Other long term (current) drug therapy: Secondary | ICD-10-CM

## 2023-07-08 DIAGNOSIS — Z7952 Long term (current) use of systemic steroids: Secondary | ICD-10-CM

## 2023-07-08 DIAGNOSIS — E1122 Type 2 diabetes mellitus with diabetic chronic kidney disease: Secondary | ICD-10-CM | POA: Diagnosis present

## 2023-07-08 DIAGNOSIS — E872 Acidosis, unspecified: Secondary | ICD-10-CM | POA: Diagnosis present

## 2023-07-08 DIAGNOSIS — I5022 Chronic systolic (congestive) heart failure: Secondary | ICD-10-CM | POA: Diagnosis present

## 2023-07-08 DIAGNOSIS — T380X5A Adverse effect of glucocorticoids and synthetic analogues, initial encounter: Secondary | ICD-10-CM | POA: Diagnosis present

## 2023-07-08 DIAGNOSIS — E785 Hyperlipidemia, unspecified: Secondary | ICD-10-CM | POA: Diagnosis present

## 2023-07-08 DIAGNOSIS — I428 Other cardiomyopathies: Secondary | ICD-10-CM | POA: Diagnosis present

## 2023-07-08 DIAGNOSIS — E66812 Obesity, class 2: Secondary | ICD-10-CM | POA: Diagnosis present

## 2023-07-08 DIAGNOSIS — E11649 Type 2 diabetes mellitus with hypoglycemia without coma: Secondary | ICD-10-CM | POA: Diagnosis present

## 2023-07-08 DIAGNOSIS — E860 Dehydration: Secondary | ICD-10-CM | POA: Diagnosis present

## 2023-07-08 DIAGNOSIS — N1832 Chronic kidney disease, stage 3b: Secondary | ICD-10-CM | POA: Diagnosis present

## 2023-07-08 DIAGNOSIS — N179 Acute kidney failure, unspecified: Secondary | ICD-10-CM | POA: Diagnosis present

## 2023-07-08 DIAGNOSIS — Z6835 Body mass index (BMI) 35.0-35.9, adult: Secondary | ICD-10-CM | POA: Diagnosis not present

## 2023-07-08 LAB — I-STAT VENOUS BLOOD GAS, ED
Acid-base deficit: 5 mmol/L — ABNORMAL HIGH (ref 0.0–2.0)
Acid-base deficit: 5 mmol/L — ABNORMAL HIGH (ref 0.0–2.0)
Bicarbonate: 19.5 mmol/L — ABNORMAL LOW (ref 20.0–28.0)
Bicarbonate: 19.3 mmol/L — ABNORMAL LOW (ref 20.0–28.0)
Calcium, Ion: 1.07 mmol/L — ABNORMAL LOW (ref 1.15–1.40)
Calcium, Ion: 1.02 mmol/L — ABNORMAL LOW (ref 1.15–1.40)
HCT: 55 % — ABNORMAL HIGH (ref 39.0–52.0)
HCT: 52 % (ref 39.0–52.0)
Hemoglobin: 18.7 g/dL — ABNORMAL HIGH (ref 13.0–17.0)
Hemoglobin: 17.7 g/dL — ABNORMAL HIGH (ref 13.0–17.0)
O2 Saturation: 83 %
O2 Saturation: 99 %
Potassium: 6.3 mmol/L (ref 3.5–5.1)
Potassium: 5.9 mmol/L — ABNORMAL HIGH (ref 3.5–5.1)
Sodium: 122 mmol/L — ABNORMAL LOW (ref 135–145)
Sodium: 124 mmol/L — ABNORMAL LOW (ref 135–145)
TCO2: 21 mmol/L — ABNORMAL LOW (ref 22–32)
TCO2: 20 mmol/L — ABNORMAL LOW (ref 22–32)
pCO2, Ven: 34.1 mmHg — ABNORMAL LOW (ref 44–60)
pCO2, Ven: 33.8 mmHg — ABNORMAL LOW (ref 44–60)
pH, Ven: 7.366 (ref 7.25–7.43)
pH, Ven: 7.365 (ref 7.25–7.43)
pO2, Ven: 49 mmHg — ABNORMAL HIGH (ref 32–45)
pO2, Ven: 129 mmHg — ABNORMAL HIGH (ref 32–45)

## 2023-07-08 LAB — CBC
HCT: 50.4 % (ref 39.0–52.0)
Hemoglobin: 17.3 g/dL — ABNORMAL HIGH (ref 13.0–17.0)
MCH: 28.7 pg (ref 26.0–34.0)
MCHC: 34.3 g/dL (ref 30.0–36.0)
MCV: 83.7 fL (ref 80.0–100.0)
Platelets: 127 10*3/uL — ABNORMAL LOW (ref 150–400)
RBC: 6.02 MIL/uL — ABNORMAL HIGH (ref 4.22–5.81)
RDW: 13.2 % (ref 11.5–15.5)
WBC: 7.1 10*3/uL (ref 4.0–10.5)
nRBC: 0 % (ref 0.0–0.2)

## 2023-07-08 LAB — URINALYSIS, ROUTINE W REFLEX MICROSCOPIC
Bacteria, UA: NONE SEEN
Bilirubin Urine: NEGATIVE
Glucose, UA: >=500 mg/dL — AB
Hgb urine dipstick: NEGATIVE
Ketones, ur: NEGATIVE mg/dL
Leukocytes,Ua: NEGATIVE
Nitrite: NEGATIVE
Protein, ur: NEGATIVE mg/dL
Specific Gravity, Urine: 1.026 (ref 1.005–1.030)
pH: 5 (ref 5.0–8.0)

## 2023-07-08 LAB — COMPREHENSIVE METABOLIC PANEL
ALT: 19 U/L (ref 0–44)
AST: 25 U/L (ref 15–41)
Albumin: 4.6 g/dL (ref 3.5–5.0)
Alkaline Phosphatase: 62 U/L (ref 38–126)
Anion gap: 16 — ABNORMAL HIGH (ref 5–15)
BUN: 39 mg/dL — ABNORMAL HIGH (ref 8–23)
CO2: 17 mmol/L — ABNORMAL LOW (ref 22–32)
Calcium: 9.5 mg/dL (ref 8.9–10.3)
Chloride: 90 mmol/L — ABNORMAL LOW (ref 98–111)
Creatinine, Ser: 3.18 mg/dL — ABNORMAL HIGH (ref 0.61–1.24)
GFR, Estimated: 21 mL/min — ABNORMAL LOW (ref >60)
Glucose, Bld: 880 mg/dL (ref 70–99)
Potassium: 6.2 mmol/L — ABNORMAL HIGH (ref 3.5–5.1)
Sodium: 123 mmol/L — ABNORMAL LOW (ref 135–145)
Total Bilirubin: 1.4 mg/dL — ABNORMAL HIGH (ref 0.0–1.2)
Total Protein: 7.5 g/dL (ref 6.5–8.1)

## 2023-07-08 LAB — CBG MONITORING, ED
Glucose-Capillary: >600 mg/dL (ref 70–99)
Glucose-Capillary: >600 mg/dL (ref 70–99)
Glucose-Capillary: >600 mg/dL (ref 70–99)
Glucose-Capillary: >600 mg/dL (ref 70–99)
Glucose-Capillary: 567 mg/dL (ref 70–99)

## 2023-07-08 LAB — OSMOLALITY: Osmolality: 323 mosm/kg (ref 275–295)

## 2023-07-08 LAB — BETA-HYDROXYBUTYRIC ACID: Beta-Hydroxybutyric Acid: 0.7 mmol/L — ABNORMAL HIGH (ref 0.05–0.27)

## 2023-07-08 MED ORDER — INSULIN REGULAR(HUMAN) IN NACL 100-0.9 UT/100ML-% IV SOLN
INTRAVENOUS | Status: DC
  Administered 2023-07-08: 12 [IU]/h via INTRAVENOUS
  Administered 2023-07-09: 14 [IU]/h via INTRAVENOUS
  Administered 2023-07-09: 4.4 [IU]/h via INTRAVENOUS
  Administered 2023-07-09: 2.4 [IU]/h via INTRAVENOUS
  Administered 2023-07-09: 4.8 [IU]/h via INTRAVENOUS
  Administered 2023-07-09: 6.5 [IU]/h via INTRAVENOUS
  Filled 2023-07-08 (×2): qty 100

## 2023-07-08 MED ORDER — DEXTROSE 50 % IV SOLN: 0.0000 mL | INTRAVENOUS | Status: DC | PRN

## 2023-07-08 MED ORDER — LACTATED RINGERS IV BOLUS
20.0000 mL/kg | Freq: Once | INTRAVENOUS | Status: AC
  Administered 2023-07-08: 1000 mL via INTRAVENOUS

## 2023-07-08 MED ORDER — PANTOPRAZOLE SODIUM 40 MG PO TBEC
40.0000 mg | DELAYED_RELEASE_TABLET | Freq: Every morning | ORAL | Status: DC
  Administered 2023-07-09 – 2023-07-10 (×2): 40 mg via ORAL
  Filled 2023-07-08 (×2): qty 1

## 2023-07-08 MED ORDER — TACROLIMUS 1 MG PO CAPS
8.0000 mg | ORAL_CAPSULE | Freq: Two times a day (BID) | ORAL | Status: DC
  Administered 2023-07-08: 8 mg via ORAL
  Filled 2023-07-08: qty 8

## 2023-07-08 MED ORDER — PREDNISONE 5 MG PO TABS
5.0000 mg | ORAL_TABLET | Freq: Every morning | ORAL | Status: DC
  Administered 2023-07-09 – 2023-07-10 (×2): 5 mg via ORAL
  Filled 2023-07-08 (×2): qty 1

## 2023-07-08 MED ORDER — LACTATED RINGERS IV SOLN: INTRAVENOUS | Status: DC

## 2023-07-08 MED ORDER — ASPIRIN 81 MG PO CHEW
81.0000 mg | CHEWABLE_TABLET | Freq: Every day | ORAL | Status: DC
  Administered 2023-07-09 – 2023-07-10 (×2): 81 mg via ORAL
  Filled 2023-07-08 (×2): qty 1

## 2023-07-08 MED ORDER — SERTRALINE HCL 50 MG PO TABS
50.0000 mg | ORAL_TABLET | Freq: Every day | ORAL | Status: DC
  Administered 2023-07-09 – 2023-07-10 (×2): 50 mg via ORAL
  Filled 2023-07-08 (×2): qty 1

## 2023-07-08 MED ORDER — AMLODIPINE BESYLATE 5 MG PO TABS
5.0000 mg | ORAL_TABLET | Freq: Every day | ORAL | Status: DC
  Administered 2023-07-09 – 2023-07-10 (×2): 5 mg via ORAL
  Filled 2023-07-08 (×2): qty 1

## 2023-07-08 MED ORDER — ALPRAZOLAM 0.5 MG PO TABS
0.5000 mg | ORAL_TABLET | Freq: Two times a day (BID) | ORAL | Status: DC | PRN
  Administered 2023-07-09: 0.5 mg via ORAL
  Filled 2023-07-08 (×2): qty 1

## 2023-07-08 MED ORDER — DEXTROSE IN LACTATED RINGERS 5 % IV SOLN: INTRAVENOUS | Status: DC

## 2023-07-08 MED ORDER — BRINZOLAMIDE 1 % OP SUSP
1.0000 [drp] | Freq: Three times a day (TID) | OPHTHALMIC | Status: DC
  Filled 2023-07-08: qty 10

## 2023-07-08 MED ORDER — ALBUTEROL SULFATE (2.5 MG/3ML) 0.083% IN NEBU: 3.0000 mL | INHALATION_SOLUTION | Freq: Four times a day (QID) | RESPIRATORY_TRACT | Status: DC | PRN

## 2023-07-08 MED ORDER — TRETINOIN 0.05 % EX CREA: TOPICAL_CREAM | Freq: Every day | CUTANEOUS | Status: DC

## 2023-07-08 MED ORDER — PREDNISOLONE ACETATE 1 % OP SUSP
1.0000 [drp] | Freq: Two times a day (BID) | OPHTHALMIC | Status: DC
  Administered 2023-07-09 – 2023-07-10 (×3): 1 [drp] via OPHTHALMIC
  Filled 2023-07-08: qty 5

## 2023-07-08 MED ORDER — PROPRANOLOL HCL 10 MG PO TABS
20.0000 mg | ORAL_TABLET | Freq: Two times a day (BID) | ORAL | Status: DC
  Administered 2023-07-08 – 2023-07-10 (×4): 20 mg via ORAL
  Filled 2023-07-08 (×4): qty 2

## 2023-07-08 MED ORDER — ALBUTEROL SULFATE (2.5 MG/3ML) 0.083% IN NEBU
10.0000 mg | INHALATION_SOLUTION | Freq: Once | RESPIRATORY_TRACT | Status: AC
  Administered 2023-07-08: 10 mg via RESPIRATORY_TRACT
  Filled 2023-07-08: qty 12

## 2023-07-08 MED ORDER — LACTATED RINGERS IV BOLUS
1000.0000 mL | INTRAVENOUS | Status: AC
  Administered 2023-07-08: 1000 mL via INTRAVENOUS

## 2023-07-08 MED ORDER — FLUTICASONE FUROATE-VILANTEROL 200-25 MCG/ACT IN AEPB
1.0000 | INHALATION_SPRAY | Freq: Every day | RESPIRATORY_TRACT | Status: DC
  Administered 2023-07-10: 1 via RESPIRATORY_TRACT
  Filled 2023-07-08: qty 28

## 2023-07-08 MED ORDER — INSULIN REGULAR(HUMAN) IN NACL 100-0.9 UT/100ML-% IV SOLN
INTRAVENOUS | Status: DC
  Administered 2023-07-08: 11.5 [IU]/h via INTRAVENOUS
  Filled 2023-07-08: qty 100

## 2023-07-08 MED ORDER — PREGABALIN 75 MG PO CAPS
150.0000 mg | ORAL_CAPSULE | Freq: Two times a day (BID) | ORAL | Status: DC
  Administered 2023-07-08 – 2023-07-09 (×2): 150 mg via ORAL
  Filled 2023-07-08 (×2): qty 2

## 2023-07-08 MED ORDER — TENOFOVIR ALAFENAMIDE FUMARATE 25 MG PO TABS
25.0000 mg | ORAL_TABLET | Freq: Every day | ORAL | Status: DC
  Administered 2023-07-09 – 2023-07-10 (×2): 25 mg via ORAL
  Filled 2023-07-08 (×4): qty 1

## 2023-07-08 MED ORDER — SENNOSIDES-DOCUSATE SODIUM 8.6-50 MG PO TABS
1.0000 | ORAL_TABLET | Freq: Every day | ORAL | Status: DC
  Administered 2023-07-08 – 2023-07-09 (×2): 1 via ORAL
  Filled 2023-07-08 (×2): qty 1

## 2023-07-08 MED ORDER — HEPARIN SODIUM (PORCINE) 5000 UNIT/ML IJ SOLN
5000.0000 [IU] | Freq: Three times a day (TID) | INTRAMUSCULAR | Status: DC
  Administered 2023-07-08 – 2023-07-10 (×5): 5000 [IU] via SUBCUTANEOUS
  Filled 2023-07-08 (×6): qty 1

## 2023-07-08 NOTE — ED Provider Triage Note (Signed)
 Emergency Medicine Provider Triage Evaluation Note  Victor [name] , a 63 y.o. male  was evaluated in triage.  Pt complains of hypoglycemia.  Was diagnosed with diabetes 2 weeks ago.  Has started Ozempic denies any pain or discomfort..  Review of Systems  Positive:  Negative:   Physical Exam  BP (!) 128/92   Pulse 84   Temp 98.2 F (36.8 C)   Resp 19   Ht 6\' 1"  (1.854 m)   Wt 122.5 kg   SpO2 95%   BMI 35.62 kg/m  Gen:   Awake, no distress   Resp:  Normal effort  MSK:   Moves extremities without difficulty  Other:  Abdomen nontender  Medical Decision Making  Medically screening exam initiated at 4:29 PM.  Appropriate orders placed.  Victor [name] was informed that the remainder of the evaluation will be completed by another provider, this initial triage assessment does not replace that evaluation, and the importance of remaining in the ED until their evaluation is complete.     Halford Decamp, PA-C 07/08/23 1629

## 2023-07-08 NOTE — ED Provider Notes (Signed)
 [place] EMERGENCY DEPARTMENT AT [facility] Provider Note   CSN: [id number] Arrival date & time: 07/08/23  1534    History  Chief Complaint  Patient presents with   Hyperglycemia   Abnormal Labs    Victor [name] is a 63 y.o. male here for evaluation of abnormal lab.  Heart transplant 2023 followed by [facility].  Was thought at that time that he had hyperglycemia due to steroid use and was initially treated with insulin, titrated off.  Was seen a few days ago noted to have elevated A1c and blood sugar of 300 was subsequently started on semaglutide.  He states he was seen by his PCP today for a routine follow-up visit and had labs performed which noted a significantly elevated blood sugar.  He states he has had some polyuria and polydipsia over the last few weeks.  Been compliant to semaglutide.  No change in his diet.  He is not currently on any steroids for his heart transplant.  No recent illnesses.  No chest pain, shortness breath, abdominal pain.  No dysuria or hematuria.  He feels like he is able to fully empty his bladder.     HPI     Home Medications Prior to Admission medications   Medication Sig Start Date End Date Taking? Authorizing Provider  ALPRAZolam Prudy Feeler) 0.5 MG tablet Take 1 tablet (0.5 mg total) by mouth 2 (two) times daily as needed for anxiety. 05/27/23  Yes Jones Bales, NP  ASPIRIN 81 PO Take 81 mg by mouth daily.   Yes [provider]  brinzolamide (AZOPT) 1 % ophthalmic suspension Place 1 drop into the right eye in the morning and at bedtime. 05/11/21  Yes [provider]  Bromfenac Sodium (PROLENSA) 0.07 % SOLN Place 1 drop into the right eye in the morning and at bedtime. 10/16/21  Yes [provider]  Calcium Carb-Cholecalciferol (CALCIUM + D3 PO) Take 1 tablet by mouth in the morning and at bedtime.   Yes [provider]  ketoconazole (NIZORAL) 2 % shampoo SMARTSIG:5 Milliliter(s) Topical As Directed   Yes  [provider]  montelukast (SINGULAIR) 10 MG tablet Take 10 mg by mouth daily.   Yes [provider]  oxyCODONE (ROXICODONE) 15 MG immediate release tablet Take 1 tablet (15 mg total) by mouth 3 (three) times daily as needed for pain. 05/27/23  Yes Jones Bales, NP  pantoprazole (PROTONIX) 40 MG tablet Take 40 mg by mouth in the morning. 05/12/21  Yes [provider]  prednisoLONE acetate (PRED FORTE) 1 % ophthalmic suspension Place 1 drop into the right eye 2 (two) times daily. 08/02/22  Yes [provider]  predniSONE (DELTASONE) 5 MG tablet Take 5 mg by mouth in the morning. 05/12/21  Yes [provider]  pregabalin (LYRICA) 150 MG capsule Take 1 capsule (150 mg total) by mouth in the morning, at noon, and at bedtime. 05/27/23  Yes Jones Bales, NP  propranolol (INDERAL) 20 MG tablet Take 20 mg by mouth in the morning and at bedtime. 10/27/21  Yes [provider]  rosuvastatin (CRESTOR) 20 MG tablet Take 20 mg by mouth daily. 05/08/22 07/07/24 Yes [provider]  Semaglutide,0.25 or 0.5MG /DOS, 2 MG/3ML SOPN Inject 0.25 mg into the skin daily. Thursday 06/26/23 07/18/23 Yes [provider]  senna-docusate (SENOKOT-S) 8.6-50 MG tablet Take 1 tablet by mouth 2 (two) times daily. 09/17/22 09/17/23 Yes [provider]  sertraline (ZOLOFT) 50 MG tablet Take 50 mg  by mouth daily. 11/02/21  Yes [provider]  sulfamethoxazole-trimethoprim (BACTRIM DS) 800-160 MG tablet Take 1 tablet by mouth 2 (two) times daily. For 14 days 07/08/23 07/22/23 Yes [provider]  tacrolimus (PROGRAF) 1 MG capsule Take 4 mg by mouth in the morning and at bedtime. 05/24/21  Yes [provider]  Tenofovir Alafenamide Fumarate (VEMLIDY) 25 MG TABS Take 1 tablet by mouth daily. 08/20/22 08/20/23 Yes [provider]  tretinoin (RETIN-A) 0.05 % cream 1 application in the evening to arms Externally Once at night for 30 days  07/06/22  Yes [provider]  albuterol (VENTOLIN HFA) 108 (90 Base) MCG/ACT inhaler Inhale 2 puffs into the lungs every 6 (six) hours as needed for wheezing or shortness of breath. Patient not taking: Reported on 07/08/2023    [provider]  amLODipine (NORVASC) 5 MG tablet Take 5 mg by mouth daily. Patient not taking: Reported on 07/08/2023 04/01/22   [provider]  fluticasone-salmeterol (WIXELA INHUB) 250-50 MCG/ACT AEPB Inhale 1 puff into the lungs daily as needed (for shortness of breath). Patient not taking: Reported on 07/08/2023 08/27/22   [provider]  Isosorb Dinitrate-hydrALAZINE (BIDIL PO) BiDil Patient not taking: Reported on 06/26/2023    [provider]  mycophenolate (CELLCEPT) 500 MG tablet Take 500 mg by mouth 2 (two) times daily.    [provider]  traZODone (DESYREL) 50 MG tablet Take 50 mg by mouth at bedtime.    [provider]      Allergies    Patient has no known allergies.    Review of Systems   Review of Systems  Constitutional: Negative.   HENT: Negative.    Respiratory: Negative.    Cardiovascular: Negative.   Gastrointestinal: Negative.   Endocrine: Positive for polydipsia and polyuria.  Genitourinary: Negative.   Musculoskeletal: Negative.   Neurological: Negative.   All other systems reviewed and are negative.   Physical Exam Updated Vital Signs BP (!) 132/106 (BP Location: Right Arm)   Pulse 83   Temp 98.2 F (36.8 C)   Resp 18   Ht 6\' 1"  (1.854 m)   Wt 122.5 kg   SpO2 95%   BMI 35.62 kg/m  Physical Exam Vitals and nursing note reviewed.  Constitutional:      General: He is not in acute distress.    Appearance: He is well-developed. He is not ill-appearing, toxic-appearing or diaphoretic.  HENT:     Head: Normocephalic and atraumatic.     Nose: Nose normal.     Mouth/Throat:     Mouth: Mucous membranes are moist.  Eyes:     Pupils: Pupils are equal, round, and reactive  to light.  Cardiovascular:     Rate and Rhythm: Normal rate and regular rhythm.     Pulses: Normal pulses.     Heart sounds: Normal heart sounds.  Pulmonary:     Effort: Pulmonary effort is normal. No respiratory distress.     Breath sounds: Normal breath sounds.  Chest:     Comments: Midline incision, old, C/D/I Abdominal:     General: Bowel sounds are normal. There is no distension.     Palpations: Abdomen is soft.     Tenderness: There is no abdominal tenderness. There is no right CVA tenderness or left CVA tenderness.     Comments: Soft, nontender  Musculoskeletal:        General: Normal range of motion.     Cervical back: Normal range of  motion and neck supple.     Comments: No bony tenderness, compartments soft, full range of motion  Skin:    General: Skin is warm and dry.     Capillary Refill: Capillary refill takes less than 2 seconds.  Neurological:     General: No focal deficit present.     Mental Status: He is alert and oriented to person, place, and time.     Cranial Nerves: No cranial nerve deficit.     Motor: No weakness.     Gait: Gait normal.     ED Results / Procedures / Treatments   Labs (all labs ordered are listed, but only abnormal results are displayed) Labs Reviewed  CBC - Abnormal; Notable for the following components:      Result Value   RBC 6.02 (*)    Hemoglobin 17.3 (*)    Platelets 127 (*)    All other components within normal limits  URINALYSIS, ROUTINE W REFLEX MICROSCOPIC - Abnormal; Notable for the following components:   Color, Urine STRAW (*)    Glucose, UA >=500 (*)    All other components within normal limits  COMPREHENSIVE METABOLIC PANEL - Abnormal; Notable for the following components:   Sodium 123 (*)    Potassium 6.2 (*)    Chloride 90 (*)    CO2 17 (*)    Glucose, Bld 880 (*)    BUN 39 (*)    Creatinine, Ser 3.18 (*)    Total Bilirubin 1.4 (*)    GFR, Estimated 21 (*)    Anion gap 16 (*)    All other components within  normal limits  BETA-HYDROXYBUTYRIC ACID - Abnormal; Notable for the following components:   Beta-Hydroxybutyric Acid 0.70 (*)    All other components within normal limits  OSMOLALITY - Abnormal; Notable for the following components:   Osmolality 323 (*)    All other components within normal limits  CBG MONITORING, ED - Abnormal; Notable for the following components:   Glucose-Capillary >600 (*)    All other components within normal limits  CBG MONITORING, ED - Abnormal; Notable for the following components:   Glucose-Capillary >600 (*)    All other components within normal limits  I-STAT VENOUS BLOOD GAS, ED - Abnormal; Notable for the following components:   pCO2, Ven 34.1 (*)    pO2, Ven 49 (*)    Bicarbonate 19.5 (*)    TCO2 21 (*)    Acid-base deficit 5.0 (*)    Sodium 122 (*)    Potassium 6.3 (*)    Calcium, Ion 1.07 (*)    HCT 55.0 (*)    Hemoglobin 18.7 (*)    All other components within normal limits  CBG MONITORING, ED - Abnormal; Notable for the following components:   Glucose-Capillary >600 (*)    All other components within normal limits  I-STAT VENOUS BLOOD GAS, ED - Abnormal; Notable for the following components:   pCO2, Ven 33.8 (*)    pO2, Ven 129 (*)    Bicarbonate 19.3 (*)    TCO2 20 (*)    Acid-base deficit 5.0 (*)    Sodium 124 (*)    Potassium 5.9 (*)    Calcium, Ion 1.02 (*)    Hemoglobin 17.7 (*)    All other components within normal limits  CBG MONITORING, ED - Abnormal; Notable for the following components:   Glucose-Capillary >600 (*)    All other components within normal limits  BASIC METABOLIC PANEL  BASIC METABOLIC  PANEL  BASIC METABOLIC PANEL  BASIC METABOLIC PANEL  HIV ANTIBODY (ROUTINE TESTING W REFLEX)  BASIC METABOLIC PANEL  BASIC METABOLIC PANEL  BASIC METABOLIC PANEL  BASIC METABOLIC PANEL  OSMOLALITY  HEMOGLOBIN A1C    EKG None  Radiology No results found.  Procedures .Critical Care  Performed by: Linwood Dibbles,  PA-C Authorized by: Linwood Dibbles, PA-C   Critical care provider statement:    Critical care time (minutes):  36   Critical care was necessary to treat or prevent imminent or life-threatening deterioration of the following conditions:  Dehydration, endocrine crisis and renal failure   Critical care was time spent personally by me on the following activities:  Development of treatment plan with patient or surrogate, discussions with consultants, evaluation of patient's response to treatment, examination of patient, ordering and review of laboratory studies, ordering and review of radiographic studies, ordering and performing treatments and interventions, pulse oximetry, re-evaluation of patient's condition and review of old charts     Medications Ordered in ED Medications  lactated ringers infusion (has no administration in time range)  dextrose 5 % in lactated ringers infusion (has no administration in time range)  dextrose 50 % solution 0-50 mL (has no administration in time range)  aspirin chewable tablet 81 mg (has no administration in time range)  amLODipine (NORVASC) tablet 5 mg (has no administration in time range)  propranolol (INDERAL) tablet 20 mg (has no administration in time range)  ALPRAZolam (XANAX) tablet 0.5 mg (has no administration in time range)  sertraline (ZOLOFT) tablet 50 mg (has no administration in time range)  tacrolimus (PROGRAF) capsule 8 mg (has no administration in time range)  pantoprazole (PROTONIX) EC tablet 40 mg (has no administration in time range)  pregabalin (LYRICA) capsule 150 mg (has no administration in time range)  senna-docusate (Senokot-S) tablet 1 tablet (has no administration in time range)  albuterol (VENTOLIN HFA) 108 (90 Base) MCG/ACT inhaler 2 puff (has no administration in time range)  fluticasone furoate-vilanterol (BREO ELLIPTA) 200-25 MCG/ACT 1 puff (has no administration in time range)  brinzolamide (AZOPT) 1 % ophthalmic  suspension 1 drop (has no administration in time range)  tretinoin (RETIN-A) 0.05 % cream (has no administration in time range)  prednisoLONE acetate (PRED FORTE) 1 % ophthalmic suspension 1 drop (has no administration in time range)  predniSONE (DELTASONE) tablet 5 mg (has no administration in time range)  tenofovir alafenamide (VEMLIDY) tablet 25 mg (has no administration in time range)  heparin injection 5,000 Units (has no administration in time range)  lactated ringers bolus 2,450 mL (has no administration in time range)  insulin regular, human (MYXREDLIN) 100 units/ 100 mL infusion (has no administration in time range)  lactated ringers infusion (has no administration in time range)  dextrose 5 % in lactated ringers infusion (has no administration in time range)  lactated ringers bolus 1,000 mL (1,000 mLs Intravenous New Bag/Given 07/08/23 2108)  albuterol (PROVENTIL) (2.5 MG/3ML) 0.083% nebulizer solution 10 mg (10 mg Nebulization Given 07/08/23 2101)   ED Course/ Medical Decision Making/ A&P Clinical Course as of 07/08/23 2227  Mon Jul 08, 2023  2128 Dr. Margette Fast with medicine to admit [BH]    Clinical Course User Index [BH] Demetry Bendickson A, PA-C   63 year old here for evaluation of abnormal lab.  Transplant in 2023 followed by [facility].  Since like he has had difficulty with hyperglycemia which per patient they thought was likely due to steroids.  He took him off steroids and  he still had elevated blood sugar subsequently started on semaglutide a few weeks ago by [facility].  He was seen by PCP today due to polyuria and polydipsia.  Noted have significantly elevated blood sugar.  CBG greater than 600.  Will plan on labs, imaging and reassess  Labs and imaging personally viewed and interpreted:  VBG bicarb 19, pH 7.3, potassium 5.9 CBC without leukocytosis Urine with glucosuria, no ketonuria, infection Metabolic panel sodium 123 suspect pseudohyponatremia in setting of hyperglycemia at  880, creatinine 3.18, prior on 2/17 at [facility] 2.1, potassium 6.2 Osmolality 323 Beta hydroxy 0.7  Patient reassessed.  We discussed labs here in ED.  Given his hyperkalemia will obtain EKG to ensure no changes.  He was started on insulin drip for his hyperglycemia, HHS which will also help with his hyperkalemia.  He was also given albuterol nebulizer for his hyperkalemia as well as IV fluids.  Discussed with medicine for admission  Discussed with Dr. Margette Fast with this and he is agreeable to evaluate patient for admission  Patient agreeable for admission  The patient appears reasonably stabilized for admission considering the current resources, flow, and capabilities available in the ED at this time, and I doubt any other [facility] requiring further screening and/or treatment in the ED prior to admission.                                 Medical Decision Making Amount and/or Complexity of Data Reviewed External Data Reviewed: labs, radiology, ECG and notes. Labs: ordered. Decision-making details documented in ED Course. Radiology: ordered and independent interpretation performed. Decision-making details documented in ED Course.  Risk OTC drugs. Prescription drug management. Parenteral controlled substances. Decision regarding hospitalization.          Final Clinical Impression(s) / ED Diagnoses Final diagnoses:  Hyperosmolar hyperglycemic state (HHS) (HCC)  Hyperkalemia  AKI (acute kidney injury) (HCC)  Heart transplant recipient [facility])    Rx / DC Orders ED Discharge Orders     None         Damyen Knoll A, PA-C 07/08/23 2227    Rozelle Logan, DO 07/08/23 2322

## 2023-07-08 NOTE — ED Triage Notes (Signed)
 Pt came in via POV d/t his PCP recommending him to come into ED d/t his glucose reading 790 on their labs. Pt did recently have a heart transplant January 1st of 2023 & reports he had complications from it & is now a diabetic (per pt). Pt reports he was initially at his providers for an appointment d/t frequent urination. A/Ox4.
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
## 2023-07-08 NOTE — ED Notes (Signed)
 Paden, RN  aware of K of 6.3;to be redrawn from access by RN.

## 2023-07-09 ENCOUNTER — Encounter: Payer: Self-pay | Admitting: Hematology and Oncology

## 2023-07-09 ENCOUNTER — Telehealth (HOSPITAL_COMMUNITY): Payer: Self-pay

## 2023-07-09 ENCOUNTER — Other Ambulatory Visit (HOSPITAL_COMMUNITY): Payer: Self-pay

## 2023-07-09 DIAGNOSIS — N1832 Chronic kidney disease, stage 3b: Secondary | ICD-10-CM | POA: Diagnosis not present

## 2023-07-09 DIAGNOSIS — E1100 Type 2 diabetes mellitus with hyperosmolarity without nonketotic hyperglycemic-hyperosmolar coma (NKHHC): Secondary | ICD-10-CM | POA: Diagnosis not present

## 2023-07-09 DIAGNOSIS — E1122 Type 2 diabetes mellitus with diabetic chronic kidney disease: Secondary | ICD-10-CM

## 2023-07-09 LAB — CBG MONITORING, ED
Glucose-Capillary: 126 mg/dL — ABNORMAL HIGH (ref 70–99)
Glucose-Capillary: 142 mg/dL — ABNORMAL HIGH (ref 70–99)
Glucose-Capillary: 198 mg/dL — ABNORMAL HIGH (ref 70–99)
Glucose-Capillary: 208 mg/dL — ABNORMAL HIGH (ref 70–99)
Glucose-Capillary: 208 mg/dL — ABNORMAL HIGH (ref 70–99)
Glucose-Capillary: 225 mg/dL — ABNORMAL HIGH (ref 70–99)
Glucose-Capillary: 242 mg/dL — ABNORMAL HIGH (ref 70–99)
Glucose-Capillary: 290 mg/dL — ABNORMAL HIGH (ref 70–99)
Glucose-Capillary: 479 mg/dL — ABNORMAL HIGH (ref 70–99)

## 2023-07-09 LAB — BASIC METABOLIC PANEL
Anion gap: 10 (ref 5–15)
Anion gap: 12 (ref 5–15)
Anion gap: 8 (ref 5–15)
Anion gap: 9 (ref 5–15)
BUN: 29 mg/dL — ABNORMAL HIGH (ref 8–23)
BUN: 30 mg/dL — ABNORMAL HIGH (ref 8–23)
BUN: 33 mg/dL — ABNORMAL HIGH (ref 8–23)
BUN: 36 mg/dL — ABNORMAL HIGH (ref 8–23)
CO2: 20 mmol/L — ABNORMAL LOW (ref 22–32)
CO2: 22 mmol/L (ref 22–32)
CO2: 22 mmol/L (ref 22–32)
CO2: 24 mmol/L (ref 22–32)
Calcium: 9 mg/dL (ref 8.9–10.3)
Calcium: 9.2 mg/dL (ref 8.9–10.3)
Calcium: 9.3 mg/dL (ref 8.9–10.3)
Calcium: 9.7 mg/dL (ref 8.9–10.3)
Chloride: 102 mmol/L (ref 98–111)
Chloride: 102 mmol/L (ref 98–111)
Chloride: 103 mmol/L (ref 98–111)
Chloride: 99 mmol/L (ref 98–111)
Creatinine, Ser: 2.55 mg/dL — ABNORMAL HIGH (ref 0.61–1.24)
Creatinine, Ser: 2.57 mg/dL — ABNORMAL HIGH (ref 0.61–1.24)
Creatinine, Ser: 2.69 mg/dL — ABNORMAL HIGH (ref 0.61–1.24)
Creatinine, Ser: 3.12 mg/dL — ABNORMAL HIGH (ref 0.61–1.24)
GFR, Estimated: 22 mL/min — ABNORMAL LOW (ref >60)
GFR, Estimated: 26 mL/min — ABNORMAL LOW (ref >60)
GFR, Estimated: 28 mL/min — ABNORMAL LOW (ref >60)
GFR, Estimated: 28 mL/min — ABNORMAL LOW (ref >60)
Glucose, Bld: 210 mg/dL — ABNORMAL HIGH (ref 70–99)
Glucose, Bld: 215 mg/dL — ABNORMAL HIGH (ref 70–99)
Glucose, Bld: 268 mg/dL — ABNORMAL HIGH (ref 70–99)
Glucose, Bld: 492 mg/dL — ABNORMAL HIGH (ref 70–99)
Potassium: 3.9 mmol/L (ref 3.5–5.1)
Potassium: 3.9 mmol/L (ref 3.5–5.1)
Potassium: 4 mmol/L (ref 3.5–5.1)
Potassium: 4.4 mmol/L (ref 3.5–5.1)
Sodium: 131 mmol/L — ABNORMAL LOW (ref 135–145)
Sodium: 134 mmol/L — ABNORMAL LOW (ref 135–145)
Sodium: 134 mmol/L — ABNORMAL LOW (ref 135–145)
Sodium: 135 mmol/L (ref 135–145)

## 2023-07-09 LAB — OSMOLALITY: Osmolality: 320 mosm/kg — ABNORMAL HIGH (ref 275–295)

## 2023-07-09 LAB — GLUCOSE, CAPILLARY
Glucose-Capillary: 215 mg/dL — ABNORMAL HIGH (ref 70–99)
Glucose-Capillary: 271 mg/dL — ABNORMAL HIGH (ref 70–99)
Glucose-Capillary: 238 mg/dL — ABNORMAL HIGH (ref 70–99)
Glucose-Capillary: 273 mg/dL — ABNORMAL HIGH (ref 70–99)
Glucose-Capillary: 247 mg/dL — ABNORMAL HIGH (ref 70–99)
Glucose-Capillary: 186 mg/dL — ABNORMAL HIGH (ref 70–99)
Glucose-Capillary: 140 mg/dL — ABNORMAL HIGH (ref 70–99)
Glucose-Capillary: 211 mg/dL — ABNORMAL HIGH (ref 70–99)
Glucose-Capillary: 275 mg/dL — ABNORMAL HIGH (ref 70–99)
Glucose-Capillary: 285 mg/dL — ABNORMAL HIGH (ref 70–99)
Glucose-Capillary: 221 mg/dL — ABNORMAL HIGH (ref 70–99)
Glucose-Capillary: 245 mg/dL — ABNORMAL HIGH (ref 70–99)

## 2023-07-09 LAB — HIV ANTIBODY (ROUTINE TESTING W REFLEX): HIV Screen 4th Generation wRfx: NONREACTIVE

## 2023-07-09 MED ORDER — TACROLIMUS 1 MG PO CAPS
4.0000 mg | ORAL_CAPSULE | Freq: Two times a day (BID) | ORAL | Status: DC
  Administered 2023-07-09 – 2023-07-10 (×3): 4 mg via ORAL
  Filled 2023-07-09 (×3): qty 4

## 2023-07-09 MED ORDER — SODIUM CHLORIDE 0.9 % IV SOLN
INTRAVENOUS | Status: AC
Start: 2023-07-09 — End: 2023-07-10

## 2023-07-09 MED ORDER — BRINZOLAMIDE 1 % OP SUSP
1.0000 [drp] | Freq: Two times a day (BID) | OPHTHALMIC | Status: DC
  Administered 2023-07-09 – 2023-07-10 (×3): 1 [drp] via OPHTHALMIC
  Filled 2023-07-09: qty 10

## 2023-07-09 MED ORDER — OXYCODONE HCL 5 MG PO TABS
15.0000 mg | ORAL_TABLET | Freq: Three times a day (TID) | ORAL | Status: DC | PRN
  Administered 2023-07-09 – 2023-07-10 (×2): 15 mg via ORAL
  Filled 2023-07-09 (×2): qty 3

## 2023-07-09 MED ORDER — MYCOPHENOLATE MOFETIL 250 MG PO CAPS
500.0000 mg | ORAL_CAPSULE | Freq: Two times a day (BID) | ORAL | Status: DC
  Administered 2023-07-09 – 2023-07-10 (×3): 500 mg via ORAL
  Filled 2023-07-09 (×3): qty 2

## 2023-07-09 MED ORDER — PREGABALIN 75 MG PO CAPS
75.0000 mg | ORAL_CAPSULE | Freq: Three times a day (TID) | ORAL | Status: DC
  Administered 2023-07-09 – 2023-07-10 (×3): 75 mg via ORAL
  Filled 2023-07-09 (×3): qty 1

## 2023-07-09 MED ORDER — ROSUVASTATIN CALCIUM 20 MG PO TABS
20.0000 mg | ORAL_TABLET | Freq: Every day | ORAL | Status: DC
  Administered 2023-07-09 – 2023-07-10 (×2): 20 mg via ORAL
  Filled 2023-07-09 (×2): qty 1

## 2023-07-09 NOTE — Telephone Encounter (Signed)
 Pharmacy Patient Advocate Encounter  Insurance verification completed.    The patient is insured through [facility]. Patient has [employer], may use a copay card, and/or apply for patient assistance if available.    Ran test claim for Humulin 70/30 and the current 30 day co-pay is $00.00.   This test claim was processed through [facility]- copay amounts may vary at other pharmacies due to pharmacy/plan contracts, or as the patient moves through the different stages of their insurance plan.
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
## 2023-07-09 NOTE — Inpatient Diabetes Management (Addendum)
 Inpatient Diabetes Program Recommendations  AACE/ADA: New Consensus Statement on Inpatient Glycemic Control (2015)  Target Ranges:  Prepandial:   less than 140 mg/dL      Peak postprandial:   less than 180 mg/dL (1-2 hours)      Critically ill patients:  140 - 180 mg/dL   Lab Results  Component Value Date   GLUCAP 142 (H) 07/09/2023   HGBA1C 5.7 (H) 04/26/2021    Review of Glycemic Control  Latest Reference Range & Units 07/09/23 06:00 07/09/23 07:59 07/09/23 09:32 07/09/23 10:40  Glucose-Capillary 70 - 99 mg/dL 914 (H) 782 (H) 956 (H) 142 (H)   Diabetes history: DM 2 Outpatient Diabetes medications: Ozempic 0.25 QThursday Current orders for Inpatient glycemic control:  IV insulin/Endotool  Inpatient Diabetes Program Recommendations:    At time of transition: -   70/30 25 units bid -   Novolog 0-9 units tid + hs  Addendum 1:30 pm: Spoke with pt at bedside regarding glucose level >800 on presentation. Pt reports starting Ozempic by PCP 2 weeks ago. Pt had stopped taking his medication early 2024 because he felt ok and thought his trends were doing well. Pt was on insulin at that time via insulin pen. Spoke with pt about the need for insulin again at this time. Discussed transition off IV insulin. Pt is familiar with the operation of the insulin pen. Discussed close follow up with PCP and the possible need for titration of insulin dose with the titration of his Ozempic outpt if weight loss occurs.   Benefits check on the 70/30 insulins underway by pharmacy.  Thanks,  Christena Deem RN, MSN, BC-ADM Inpatient Diabetes Coordinator Team Pager [phone] (8a-5p)

## 2023-07-09 NOTE — Inpatient Diabetes Management (Signed)
 Inpatient Diabetes Program Recommendations  AACE/ADA: New Consensus Statement on Inpatient Glycemic Control (2015)  Target Ranges:  Prepandial:   less than 140 mg/dL      Peak postprandial:   less than 180 mg/dL (1-2 hours)      Critically ill patients:  140 - 180 mg/dL   Lab Results  Component Value Date   GLUCAP 273 (H) 07/09/2023   HGBA1C 5.7 (H) 04/26/2021     Discharge Recommendations: Intermediate acting recommendations:  Humulin 70/30  Supply/Referral recommendations: Pen needles - standard   Use Adult Diabetes Insulin Treatment Post Discharge order set.   Dose TBD closer to d/c.  Thanks, Christena Deem RN, MSN, BC-ADM Inpatient Diabetes Coordinator Team Pager [phone] (8a-5p)

## 2023-07-09 NOTE — Hospital Course (Signed)
 [name] is a 63 yo male with PMH nonischemic cardiomyopathy s/p heart transplant 2023, DM II, obesity, HTN, CKD, chronic hep C, OSA, asthma who presented with hyperglycemia.  He stopped taking insulin approximately a year ago due to "feeling better".  He has only been on Ozempic recently.  He has recently seen endocrinology with plans for further additional treatment if necessary.  Recent A1c 8.9%. On workup in the ER he was found to be in HHS with initial glucose 880 on workup.  He was placed on fluids and insulin drip.  He had mild worsening of renal function as well from baseline.

## 2023-07-09 NOTE — ED Notes (Signed)
 Per communication with Dr. Frederick Peers, this is current status of insulin/Endotool tx. "Restart for now and if still stable at ~2 in about an hour or so I'll transition."  Insulin was restarted at 11:09.  For some reason it rings occluded often though fluid is running fine in same line.  Pt is aware of plan.

## 2023-07-09 NOTE — Telephone Encounter (Signed)
 Pharmacy Patient Advocate Encounter  Insurance verification completed.    The patient is insured through [facility]. Patient has [employer], may use a copay card, and/or apply for patient assistance if available.    Ran test claim for Humulin 70/30 and the current 30 day co-pay is 0.00.   This test claim was processed through [facility]- copay amounts may vary at other pharmacies due to pharmacy/plan contracts, or as the patient moves through the different stages of their insurance plan.

## 2023-07-09 NOTE — Assessment & Plan Note (Signed)
-   Chronic prednisone use with heart transplant and underlying diabetes.  No insulin for about a year.  Recently on Ozempic -He did tolerate being on Humulin 70/30 in the past and we will plan to transition back to that once off the insulin drip

## 2023-07-10 ENCOUNTER — Other Ambulatory Visit (HOSPITAL_COMMUNITY): Payer: Self-pay

## 2023-07-10 ENCOUNTER — Encounter: Payer: Self-pay | Admitting: Hematology and Oncology

## 2023-07-10 ENCOUNTER — Telehealth (HOSPITAL_COMMUNITY): Payer: Self-pay

## 2023-07-10 DIAGNOSIS — E1100 Type 2 diabetes mellitus with hyperosmolarity without nonketotic hyperglycemic-hyperosmolar coma (NKHHC): Secondary | ICD-10-CM | POA: Diagnosis not present

## 2023-07-10 DIAGNOSIS — Z941 Heart transplant status: Secondary | ICD-10-CM

## 2023-07-10 DIAGNOSIS — D751 Secondary polycythemia: Secondary | ICD-10-CM

## 2023-07-10 DIAGNOSIS — N179 Acute kidney failure, unspecified: Secondary | ICD-10-CM | POA: Diagnosis not present

## 2023-07-10 DIAGNOSIS — E875 Hyperkalemia: Secondary | ICD-10-CM | POA: Diagnosis not present

## 2023-07-10 LAB — CBC WITH DIFFERENTIAL/PLATELET
Abs Immature Granulocytes: 0.13 10*3/uL — ABNORMAL HIGH (ref 0.00–0.07)
Basophils Absolute: 0 10*3/uL (ref 0.0–0.1)
Basophils Relative: 0 %
Eosinophils Absolute: 0.3 10*3/uL (ref 0.0–0.5)
Eosinophils Relative: 4 %
HCT: 47.6 % (ref 39.0–52.0)
Hemoglobin: 16.1 g/dL (ref 13.0–17.0)
Immature Granulocytes: 2 %
Lymphocytes Relative: 22 %
Lymphs Abs: 1.6 10*3/uL (ref 0.7–4.0)
MCH: 28.8 pg (ref 26.0–34.0)
MCHC: 33.8 g/dL (ref 30.0–36.0)
MCV: 85.2 fL (ref 80.0–100.0)
Monocytes Absolute: 0.8 10*3/uL (ref 0.1–1.0)
Monocytes Relative: 11 %
Neutro Abs: 4.4 10*3/uL (ref 1.7–7.7)
Neutrophils Relative %: 61 %
Platelets: 113 10*3/uL — ABNORMAL LOW (ref 150–400)
RBC: 5.59 MIL/uL (ref 4.22–5.81)
RDW: 13.6 % (ref 11.5–15.5)
WBC: 7.3 10*3/uL (ref 4.0–10.5)
nRBC: 0 % (ref 0.0–0.2)

## 2023-07-10 LAB — GLUCOSE, CAPILLARY
Glucose-Capillary: 121 mg/dL — ABNORMAL HIGH (ref 70–99)
Glucose-Capillary: 133 mg/dL — ABNORMAL HIGH (ref 70–99)
Glucose-Capillary: 152 mg/dL — ABNORMAL HIGH (ref 70–99)
Glucose-Capillary: 195 mg/dL — ABNORMAL HIGH (ref 70–99)
Glucose-Capillary: 237 mg/dL — ABNORMAL HIGH (ref 70–99)
Glucose-Capillary: 296 mg/dL — ABNORMAL HIGH (ref 70–99)
Glucose-Capillary: 331 mg/dL — ABNORMAL HIGH (ref 70–99)

## 2023-07-10 LAB — BASIC METABOLIC PANEL
Anion gap: 6 (ref 5–15)
BUN: 20 mg/dL (ref 8–23)
CO2: 19 mmol/L — ABNORMAL LOW (ref 22–32)
Calcium: 8.2 mg/dL — ABNORMAL LOW (ref 8.9–10.3)
Chloride: 109 mmol/L (ref 98–111)
Creatinine, Ser: 2.14 mg/dL — ABNORMAL HIGH (ref 0.61–1.24)
GFR, Estimated: 34 mL/min — ABNORMAL LOW (ref >60)
Glucose, Bld: 179 mg/dL — ABNORMAL HIGH (ref 70–99)
Potassium: 4.1 mmol/L (ref 3.5–5.1)
Sodium: 134 mmol/L — ABNORMAL LOW (ref 135–145)

## 2023-07-10 LAB — MAGNESIUM: Magnesium: 1.6 mg/dL — ABNORMAL LOW (ref 1.7–2.4)

## 2023-07-10 LAB — HEMOGLOBIN A1C
Hgb A1c MFr Bld: 12.4 % — ABNORMAL HIGH (ref 4.8–5.6)
Mean Plasma Glucose: 309 mg/dL

## 2023-07-10 MED ORDER — INSULIN ISOPHANE & REGULAR (HUMAN 70-30)100 UNIT/ML KWIKPEN
30.0000 [IU] | PEN_INJECTOR | Freq: Two times a day (BID) | SUBCUTANEOUS | 0 refills | Status: AC
  Filled 2023-07-10 – 2023-08-28 (×2): qty 15, 25d supply, fill #0

## 2023-07-10 MED ORDER — BLOOD GLUCOSE TEST VI STRP
1.0000 | ORAL_STRIP | Freq: Three times a day (TID) | 0 refills | Status: AC
  Filled 2023-07-10: qty 100, 34d supply, fill #0

## 2023-07-10 MED ORDER — INSULIN ASPART PROT & ASPART (70-30 MIX) 100 UNIT/ML ~~LOC~~ SUSP
20.0000 [IU] | Freq: Two times a day (BID) | SUBCUTANEOUS | Status: DC
  Administered 2023-07-10: 20 [IU] via SUBCUTANEOUS
  Filled 2023-07-10: qty 10

## 2023-07-10 MED ORDER — INSULIN ASPART PROT & ASPART (70-30 MIX) 100 UNIT/ML ~~LOC~~ SUSP: 5.0000 [IU] | Freq: Two times a day (BID) | SUBCUTANEOUS | Status: DC

## 2023-07-10 MED ORDER — ACCU-CHEK SOFTCLIX LANCETS MISC
1.0000 | Freq: Three times a day (TID) | 0 refills | Status: AC
Start: 2023-07-10 — End: ?
  Filled 2023-07-10: qty 100, 30d supply, fill #0

## 2023-07-10 MED ORDER — MAGNESIUM SULFATE 2 GM/50ML IV SOLN
2.0000 g | Freq: Once | INTRAVENOUS | Status: AC
  Administered 2023-07-10: 2 g via INTRAVENOUS
  Filled 2023-07-10: qty 50

## 2023-07-10 MED ORDER — ADULT MULTIVITAMIN W/MINERALS CH
1.0000 | ORAL_TABLET | Freq: Every day | ORAL | Status: DC
  Administered 2023-07-10: 1 via ORAL
  Filled 2023-07-10: qty 1

## 2023-07-10 MED ORDER — LANCET DEVICE MISC
1.0000 | Freq: Three times a day (TID) | 0 refills | Status: AC
  Filled 2023-07-10: qty 1, 30d supply, fill #0

## 2023-07-10 MED ORDER — INSULIN ASPART 100 UNIT/ML IJ SOLN: 0.0000 [IU] | Freq: Every day | INTRAMUSCULAR | Status: DC

## 2023-07-10 MED ORDER — INSULIN PEN NEEDLE 32G X 4 MM MISC
1.0000 | Freq: Three times a day (TID) | 0 refills | Status: AC
Start: 2023-07-10 — End: ?
  Filled 2023-07-10: qty 100, 30d supply, fill #0

## 2023-07-10 MED ORDER — BLOOD GLUCOSE MONITOR SYSTEM W/DEVICE KIT
1.0000 | PACK | Freq: Three times a day (TID) | 0 refills | Status: AC
Start: 2023-07-10 — End: ?
  Filled 2023-07-10: qty 1, 30d supply, fill #0

## 2023-07-10 MED ORDER — INSULIN ASPART 100 UNIT/ML IJ SOLN
0.0000 [IU] | Freq: Three times a day (TID) | INTRAMUSCULAR | Status: DC
  Administered 2023-07-10: 11 [IU] via SUBCUTANEOUS
  Administered 2023-07-10: 4 [IU] via SUBCUTANEOUS

## 2023-07-10 MED ORDER — DEXCOM G7 SENSOR MISC
0 refills | Status: AC
  Filled 2023-07-10: qty 3, 30d supply, fill #0

## 2023-07-10 NOTE — Progress Notes (Signed)
 Initial Nutrition Assessment  DOCUMENTATION CODES:   Obesity unspecified  INTERVENTION:  Multivitamin with minerals My plate for diabetes hand out   NUTRITION DIAGNOSIS:   Inadequate oral intake related to acute illness as evidenced by estimated needs.    GOAL:   Patient will meet greater than or equal to 90% of their needs    MONITOR:   PO intake  REASON FOR ASSESSMENT:   Consult Assessment of nutrition requirement/status, Diet education  ASSESSMENT:   63 y.o. male with medical history significant of nonischemic cardiomyopathy status post heart transplant in 2023, currently on Prograf and prednisone.  Other significant medical history include obesity, hypertension, chronic kidney disease stage III chronic hep C, obstructive sleep apnea and asthma.  Per patient, since his heart transplant, he has significantly done better.  Currently off most of his previous medications including furosemide, isosorbide.  He was diagnosed with type 2 diabetes mellitus in the past and had episodes of hyperglycemia.He was previously previously treated with insulin but self stopped several months ago.  He was seen 2 weeks ago in the cardiology clinic in [place] and was started on semaglutide.  Patient alert at time of visit.  Stated that he understood carb-modified diet. Stated that he new exactly how he got into his current medical situation.  He was feeling better and stopped taking his insulin.  Offered information and on my plate answered his questions.  No current weight loss or appetite changes.    Admit weight: 122.5 kg Weight history: 07/08/23 122.5 kg  06/26/23 125.2 kg  05/27/23 131.1 kg  04/01/23 129.3 kg  01/28/23 128.8 kg  12/03/22 129.8 kg  10/05/22 127.9 kg  08/09/22 122 kg  06/07/22 127.9 kg  04/06/22 122.2 kg    Average Meal Intake: No current documentation  Nutritionally Relevant Medications: Scheduled Meds:  amLODipine  5 mg Oral Daily   insulin aspart  0-20  Units Subcutaneous TID WC   insulin aspart  0-5 Units Subcutaneous QHS   insulin aspart protamine- aspart  20 Units Subcutaneous BID WC   pregabalin  75 mg Oral TID   senna-docusate  1 tablet Oral QHS   sertraline  50 mg Oral Daily    Continuous Infusions:    Labs Reviewed:  CBG ranges from 210-179 mg/dL over the last 24 hours    NUTRITION - FOCUSED PHYSICAL EXAM:  Flowsheet Row Most Recent Value  Orbital Region No depletion  Upper Arm Region No depletion  Thoracic and Lumbar Region No depletion  Buccal Region No depletion  Temple Region No depletion  Clavicle Bone Region No depletion  Clavicle and Acromion Bone Region No depletion  Scapular Bone Region No depletion  Dorsal Hand No depletion  Patellar Region No depletion  Anterior Thigh Region No depletion  Posterior Calf Region No depletion  Edema (RD Assessment) Mild  Hair Reviewed  Eyes Reviewed  Mouth Reviewed  Skin Reviewed  Nails Reviewed       Diet Order:   Diet Order             Diet Carb Modified Fluid consistency: Thin; Room service appropriate? Yes  Diet effective now                   EDUCATION NEEDS:   Education needs have been addressed  Skin:  Skin Assessment: Reviewed RN Assessment  Last BM:  07/09/23  Height:   Ht Readings from Last 1 Encounters:  07/08/23 6\' 1"  (1.854 m)    Weight:  Wt Readings from Last 1 Encounters:  07/08/23 122.5 kg    Ideal Body Weight:     BMI:  Body mass index is 35.62 kg/m.  Estimated Nutritional Needs:   Kcal:  2200-2500 kcal  Protein:  110-130 g  Fluid:  61ml/kcal    Jamelle Haring RDN, LDN Clinical Dietitian   If unable to reach, please contact "RD Inpatient" secure chat group between 8 am-4 pm daily"

## 2023-07-10 NOTE — Inpatient Diabetes Management (Signed)
 Inpatient Diabetes Program Recommendations  AACE/ADA: New Consensus Statement on Inpatient Glycemic Control (2015)  Target Ranges:  Prepandial:   less than 140 mg/dL      Peak postprandial:   less than 180 mg/dL (1-2 hours)      Critically ill patients:  140 - 180 mg/dL   Lab Results  Component Value Date   GLUCAP 331 (H) 07/10/2023   HGBA1C 5.7 (H) 04/26/2021     Discharge Recommendations: Other recommendations: Dexcom G7 sensor order # [id number] Intermediate acting recommendations:  Humulin 70/30  Supply/Referral recommendations: Pen needles - standard   Use Adult Diabetes Insulin Treatment Post Discharge order set.   Spoke with pt and assisted in [facility] G7 app download and setup. Also assisted in applying sensor to back of the arm.  Thanks,  Christena Deem RN, MSN, BC-ADM Inpatient Diabetes Coordinator Team Pager [phone] (8a-5p)

## 2023-07-10 NOTE — Discharge Summary (Addendum)
 Physician Discharge Summary  Victor [name] AVW:[id number] DOB: [birth year] DOA: 07/08/2023  PCP: Victor Height, PA  Admit date: 07/08/2023 Discharge date: 07/10/2023  Admitted From: (Home) Disposition:  (Home )  Recommendations for Outpatient Follow-up:  Follow up with PCP in 1-2 weeks Please obtain BMP/CBC in one week Patient reports he has a follow-up appointment with endocrinology 08/05/2023, he was instructed to keep   Diet recommendation: Heart Healthy / Carb Modified   Brief/Interim Summary: Victor [name] is a 63 yo male with PMH nonischemic cardiomyopathy s/p heart transplant 2023, DM II, obesity, HTN, CKD, chronic hep C, OSA, asthma who presented with hyperglycemia.  He stopped taking insulin approximately a year ago due to "feeling better".  He has only been on Victor [name] recently.  He has recently seen endocrinology with plans for further additional treatment if necessary.  Recent A1c 8.9%. On workup in the ER he was found to be in HHS with initial glucose 880 on workup.  He was placed on fluids and insulin drip.  He had mild worsening of renal function as well from baseline.  Hyperosmolar hyperglycemic state (HHS) (HCC) Diabetes mellitus, type II, poorly controlled with hyperglycemia/HHS - Chronic prednisone use with heart transplant and underlying diabetes.  No insulin for about a year.  Recently on Victor [name] just 2 weeks ago, he was started on insulin drip, he was transition to Humulin 70/30, dose has uptitrated, he will be discharged on 30 units twice daily, he was seen by diabetic coordinator, and Victor [name] has been initiated, insulin and Victor [name] has been dispensed from the hospital. -He is to continue his home dose of Victor [name] - already established with endocrine outpatient at [facility] as well -Recent A1c 8.9% on 06/24/2023 follow-up pending A1c   Hyperkalemia -Initial potassium 6.2 on admission; suspect from underlying worsened renal function -Improved with fluids, it is 4.2 on discharge    History of heart transplant -Continue tacrolimus, mycophenolate, prednisone.  Doses verified -Follows with Victor [name]   Acute kidney injury on chronic kidney disease stage IIIb - Most likely prerenal etiology from dehydration.  Patient will be volume repleted with IV fluids.  Avoid nephrotoxic medication.  Demeaned 2.1 on discharge   Hypomagnesemia  - repleaced  Chronic hepatitis C - on chronic tenofovir.   Pseudohyponatremia - secondary to severe hyperglycemia.  Serum sodium to improve with correction of blood glucose.   Victor [name]/secondary polycythemia - Most likely has underlying secondary causes from obstructive sleep apnea.   - Hemoconcentration likely contributory as well   History of obstructive sleep apnea - Not on CPAP at home.   Obesity class II Body mass index is 35.62 kg/m. Continue Victor [name]   Discharge Diagnoses:  Principal Problem:   Hyperosmolar hyperglycemic state (HHS) (HCC) Active Problems:   BP (high blood pressure)   Hyperlipidemia   Polycythemia, secondary    Discharge Instructions   Allergies as of 07/10/2023   No Known Allergies      Medication List     STOP taking these medications    BIDIL PO       TAKE these medications    acetaminophen 500 MG tablet Commonly known as: TYLENOL Take 1,000 mg by mouth daily as needed for mild pain (pain score 1-3).   albuterol 108 (90 Base) MCG/ACT inhaler Commonly known as: VENTOLIN HFA Inhale 2 puffs into the lungs every 6 (six) hours as needed for wheezing or shortness of breath.   ALPRAZolam 0.5 MG tablet Commonly known as: XANAX Take 1 tablet (0.5 mg total) by mouth 2 (  two) times daily as needed for anxiety.   amLODipine 5 MG tablet Commonly known as: NORVASC Take 5 mg by mouth daily.   ASPIRIN 81 PO Take 81 mg by mouth daily.   Blood Glucose Monitoring Suppl Devi 1 each by Does not apply route 3 (three) times daily. May dispense any manufacturer covered by patient's  insurance.   BLOOD GLUCOSE TEST STRIPS Strp 1 each by Does not apply route 3 (three) times daily. Use as directed to check blood sugar. May dispense any manufacturer covered by patient's insurance and fits patient's device.   brinzolamide 1 % ophthalmic suspension Commonly known as: AZOPT Place 1 drop into the right eye in the morning and at bedtime.   CALCIUM + D3 PO Take 1 tablet by mouth in the morning and at bedtime.   Victor [name] G5 Mob/G4 Plat Sensor Misc USE PER INSUTRUCTION   insulin isophane & regular human KwikPen (70-30) 100 UNIT/ML KwikPen Commonly known as: HUMULIN 70/30 MIX Inject 30 Units into the skin 2 (two) times daily.   ketoconazole 2 % shampoo Commonly known as: NIZORAL SMARTSIG:5 Milliliter(s) Topical As Directed   Lancet Device Misc 1 each by Does not apply route 3 (three) times daily. May dispense any manufacturer covered by patient's insurance.   Lancets Misc 1 each by Does not apply route 3 (three) times daily. Use as directed to check blood sugar. May dispense any manufacturer covered by patient's insurance and fits patient's device.   montelukast 10 MG tablet Commonly known as: SINGULAIR Take 10 mg by mouth daily.   mycophenolate 500 MG tablet Commonly known as: CELLCEPT Take 500 mg by mouth 2 (two) times daily.   oxyCODONE 15 MG immediate release tablet Commonly known as: ROXICODONE Take 1 tablet (15 mg total) by mouth 3 (three) times daily as needed for pain.   pantoprazole 40 MG tablet Commonly known as: PROTONIX Take 40 mg by mouth in the morning.   Pen Needles 31G X 5 MM Misc 1 each by Does not apply route 3 (three) times daily. May dispense any manufacturer covered by patient's insurance.   prednisoLONE acetate 1 % ophthalmic suspension Commonly known as: PRED FORTE Place 1 drop into the right eye 2 (two) times daily.   predniSONE 5 MG tablet Commonly known as: DELTASONE Take 5 mg by mouth in the morning.   pregabalin 150 MG  capsule Commonly known as: LYRICA Take 1 capsule (150 mg total) by mouth in the morning, at noon, and at bedtime.   Prolensa 0.07 % Soln Generic drug: Bromfenac Sodium Place 1 drop into the right eye in the morning and at bedtime.   propranolol 20 MG tablet Commonly known as: INDERAL Take 20 mg by mouth in the morning and at bedtime.   rosuvastatin 20 MG tablet Commonly known as: CRESTOR Take 20 mg by mouth daily.   Semaglutide(0.25 or 0.5MG /DOS) 2 MG/3ML Sopn Inject 0.25 mg into the skin daily. Thursday   senna-docusate 8.6-50 MG tablet Commonly known as: Senokot-S Take 1 tablet by mouth 2 (two) times daily.   sertraline 50 MG tablet Commonly known as: ZOLOFT Take 50 mg by mouth daily.   sulfamethoxazole-trimethoprim 800-160 MG tablet Commonly known as: BACTRIM DS Take 1 tablet by mouth 2 (two) times daily. For 14 days   tacrolimus 1 MG capsule Commonly known as: PROGRAF Take 4 mg by mouth in the morning and at bedtime.   traZODone 50 MG tablet Commonly known as: DESYREL Take 50 mg by mouth at  bedtime.   tretinoin 0.05 % cream Commonly known as: RETIN-A 1 application in the evening to arms Externally Once at night for 30 days   Vemlidy 25 MG tablet Generic drug: tenofovir alafenamide Take 1 tablet by mouth daily.   Wixela Inhub 250-50 MCG/ACT Aepb Generic drug: fluticasone-salmeterol Inhale 1 puff into the lungs daily as needed (for shortness of breath).        No Known Allergies  Consultations: None   Procedures/Studies: No results found.    Subjective:  He denies any complaints today, eager to go home Discharge Exam: Vitals:   07/10/23 0900 07/10/23 1029  BP:    Pulse: 92 89  Resp:    Temp:  98.4 F (36.9 C)  SpO2:     Vitals:   07/10/23 0415 07/10/23 0820 07/10/23 0900 07/10/23 1029  BP: (!) 140/93 (!) 134/95    Pulse: 86 91 92 89  Resp: 20 (!) 22    Temp: 98.2 F (36.8 C)   98.4 F (36.9 C)  TempSrc: Oral     SpO2: 96% 94%     Weight:      [name]:        General: Pt is alert, awake, not in acute distress Cardiovascular: RRR, S1/S2 +, no rubs, no gallops Respiratory: CTA bilaterally, no wheezing, no rhonchi Abdominal: Soft, NT, ND, bowel sounds + Extremities: no edema, no cyanosis    The results of significant diagnostics from this hospitalization (including imaging, microbiology, ancillary and laboratory) are listed below for reference.     Microbiology: No results found for this or any previous visit (from the past 240 hours).   Labs: BNP (last 3 results) No results for input(s): "BNP" in the last 8760 hours. Basic Metabolic Panel: Recent Labs  Lab 07/08/23 2355 07/09/23 0157 07/09/23 0414 07/09/23 0547 07/10/23 0508  NA 131* 134* 135 134* 134*  K 4.4 3.9 4.0 3.9 4.1  CL 99 102 102 103 109  CO2 22 20* 24 22 19*  GLUCOSE 492* 268* 215* 210* 179*  BUN 36* 33* 29* 30* 20  CREATININE 3.12* 2.69* 2.57* 2.55* 2.14*  CALCIUM 9.7 9.3 9.0 9.2 8.2*  MG  --   --   --   --  1.6*   Liver Function Tests: Recent Labs  Lab 07/08/23 1903  AST 25  ALT 19  ALKPHOS 62  BILITOT 1.4*  PROT 7.5  ALBUMIN 4.6   No results for input(s): "LIPASE", "AMYLASE" in the last 168 hours. No results for input(s): "AMMONIA" in the last 168 hours. CBC: Recent Labs  Lab 07/08/23 1620 07/08/23 1909 07/08/23 2004 07/10/23 0508  WBC 7.1  --   --  7.3  NEUTROABS  --   --   --  4.4  HGB 17.3* 18.7* 17.7* 16.1  HCT 50.4 55.0* 52.0 47.6  MCV 83.7  --   --  85.2  PLT 127*  --   --  113*   Cardiac Enzymes: No results for input(s): "CKTOTAL", "CKMB", "CKMBINDEX", "TROPONINI" in the last 168 hours. BNP: Invalid input(s): "POCBNP" CBG: Recent Labs  Lab 07/10/23 0126 07/10/23 0226 07/10/23 0819 07/10/23 1025 07/10/23 1302  GLUCAP 133* 121* 195* 296* 331*   D-Dimer No results for input(s): "DDIMER" in the last 72 hours. Hgb A1c No results for input(s): "HGBA1C" in the last 72 hours. Lipid Profile No  results for input(s): "CHOL", "HDL", "LDLCALC", "TRIG", "CHOLHDL", "LDLDIRECT" in the last 72 hours. Thyroid function studies No results for input(s): "TSH", "T4TOTAL", "T3FREE", "  THYROIDAB" in the last 72 hours.  Invalid input(s): "FREET3" Anemia work up No results for input(s): "VITAMINB12", "FOLATE", "FERRITIN", "TIBC", "IRON", "RETICCTPCT" in the last 72 hours. Urinalysis    Component Value Date/Time   COLORURINE STRAW (A) 07/08/2023 1620   APPEARANCEUR CLEAR 07/08/2023 1620   LABSPEC 1.026 07/08/2023 1620   PHURINE 5.0 07/08/2023 1620   GLUCOSEU >=500 (A) 07/08/2023 1620   HGBUR NEGATIVE 07/08/2023 1620   BILIRUBINUR NEGATIVE 07/08/2023 1620   KETONESUR NEGATIVE 07/08/2023 1620   PROTEINUR NEGATIVE 07/08/2023 1620   UROBILINOGEN 0.2 02/22/2014 0851   NITRITE NEGATIVE 07/08/2023 1620   LEUKOCYTESUR NEGATIVE 07/08/2023 1620   Sepsis Labs Recent Labs  Lab 07/08/23 1620 07/10/23 0508  WBC 7.1 7.3   Microbiology No results found for this or any previous visit (from the past 240 hours).   Time coordinating discharge: Over 30 minutes  SIGNED:   Huey Bienenstock, MD  [facility] 07/10/2023, 2:02 PM Pager   If 7PM-7AM, please contact night-coverage [email or web address] Password [email or web address]

## 2023-07-10 NOTE — Telephone Encounter (Signed)
 Pharmacy Patient Advocate Encounter  Insurance verification completed.    The patient is insured through [facility].     Ran test claim for Dexcom G6 and the current 30 day co-pay is $0.00. Ran test claim for [employer] and the current 30 day copay is $30.00   This test claim was processed through [employer]- copay amounts may vary at other pharmacies due to [employer], or as the patient moves through the different stages of their insurance plan.

## 2023-07-10 NOTE — TOC Initial Note (Signed)
 Transition of Care [facility]) - Initial/Assessment Note    Patient Details  Name: Victor [name] MRN: [id number] Date of Birth: 18-Jan-1961  Transition of Care [facility]) CM/SW Contact:    Lamonte Sakai, Student-Social Work Phone Number: 07/10/2023, 9:13 AM  Clinical Narrative:                  Pt admitted from PCP with spouse. No TOC needs identified at this time, please consult as needs arise.        Patient Goals and CMS Choice            Expected Discharge Plan and Services       Living arrangements for the past 2 months: Single Family Home                                      Prior Living Arrangements/Services Living arrangements for the past 2 months: Single Family Home Lives with:: Spouse                   Activities of Daily Living   ADL Screening (condition at time of admission) Independently performs ADLs?: Yes (appropriate for developmental age) Is the patient deaf or have difficulty hearing?: No Does the patient have difficulty seeing, even when wearing glasses/contacts?: No Does the patient have difficulty concentrating, remembering, or making decisions?: No  Permission Sought/Granted                  Emotional Assessment       Orientation: : Oriented to Self, Oriented to Place, Oriented to  Time, Oriented to Situation      Admission diagnosis:  Hyperkalemia [E87.5] DKA (diabetic ketoacidosis) (HCC) [E11.10] AKI (acute kidney injury) (HCC) [N17.9] Heart transplant recipient (HCC) [Z94.1] Hyperosmolar hyperglycemic state (HHS) (HCC) [E11.00] Patient Active Problem List   Diagnosis Date Noted   Hyperosmolar hyperglycemic state (HHS) (HCC) 07/08/2023   Atrial fibrillation with RVR (HCC)    Fever    Shock (HCC)    Acute on chronic systolic heart failure, NYHA class 3 (HCC) 04/19/2021   Erroneous encounter - disregard 10/18/2020   Polycythemia, secondary 09/29/2020   Situational anxiety 12/31/2018   Open wound 01/16/2017    Hyperlipidemia 01/14/2015   Erectile dysfunction 09/21/2014   Osteomyelitis of left lower extremity (HCC) 08/11/2014   Rotator cuff rupture, complete 02/25/2014   Tendinitis of left rotator cuff 11/17/2013   Pain in joint, shoulder region 09/07/2013   Biceps tendonitis on left 05/11/2013   Asthma without status asthmaticus 03/16/2013   BP (high blood pressure) 03/16/2013   History of inferior vena caval filter placement 03/16/2013   Chronic glaucoma 02/10/2013   Cardiomyopathy-  nonischemic 08/28/2012   Chronic systolic heart failure (HCC) 08/28/2012   Breast enlargement 08/28/2012   Sleep-disordered breathing 08/28/2012   Dyssomnia 08/28/2012   Pseudophakia 05/23/2012   Glaucoma suspect 03/18/2012   Nuclear sclerotic cataract 03/18/2012   Zonular dehiscence 03/18/2012   Post-traumatic osteoarthritis of left knee 07/23/2011   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   Plantar fasciitis 07/23/2011   Injury of peroneal nerve 07/23/2011   Lower limb nerve lesion 07/23/2011   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   PCP:  Milus Height, PA [id number] [place], [place] - [address] [place] [address] Phone: [phone] Fax: [phone]  [facility] [place], [place] -  [address] [facility] AT Portal to Registered [facility] Sites [address] [place] [address] Phone: [phone] Fax: [phone]     Social Drivers of Health (SDOH) Social History: SDOH Screenings   Food Insecurity: No Food Insecurity (07/09/2023)  Housing: Low Risk  (07/09/2023)  Transportation Needs: No Transportation Needs (07/09/2023)  Utilities: Not At Risk (07/09/2023)  Depression (PHQ2-9): Low Risk  (05/27/2023)  Financial Resource Strain: Low Risk  (06/15/2021)   Received from [facility]  Tobacco Use: Low Risk  (07/08/2023)   SDOH Interventions:     Readmission Risk  Interventions     No data to display

## 2023-07-10 NOTE — Discharge Instructions (Signed)
 Follow with Primary MD Redmon, Noelle, PA in 7 days   Get CBC, CMP, checked  by Primary MD next visit.    Activity: As tolerated with Full fall precautions use walker/cane & assistance as needed   Disposition Home    Diet: Heart Healthy / carb modified   On your next visit with your primary care physician please Get Medicines reviewed and adjusted.   Please request your Prim.MD to go over all Hospital Tests and Procedure/Radiological results at the follow up, please get all Hospital records sent to your Prim MD by signing hospital release before you go home.   If you experience worsening of your admission symptoms, develop shortness of breath, life threatening emergency, suicidal or homicidal thoughts you must seek medical attention immediately by calling 911 or calling your MD immediately  if symptoms less severe.  You Must read complete instructions/literature along with all the possible adverse reactions/side effects for all the Medicines you take and that have been prescribed to you. Take any new Medicines after you have completely understood and accpet all the possible adverse reactions/side effects.   Do not drive, operating heavy machinery, perform activities at heights, swimming or participation in water activities or provide baby sitting services if your were admitted for syncope or siezures until you have seen by Primary MD or a Neurologist and advised to do so again.  Do not drive when taking Pain medications.    Do not take more than prescribed Pain, Sleep and Anxiety Medications  Special Instructions: If you have smoked or chewed Tobacco  in the last 2 yrs please stop smoking, stop any regular Alcohol  and or any Recreational drug use.  Wear Seat belts while driving.   Please note  You were cared for by a hospitalist during your hospital stay. If you have any questions about your discharge medications or the care you received while you were in the hospital after you  are discharged, you can call the unit and asked to speak with the hospitalist on call if the hospitalist that took care of you is not available. Once you are discharged, your primary care physician will handle any further medical issues. Please note that NO REFILLS for any discharge medications will be authorized once you are discharged, as it is imperative that you return to your primary care physician (or establish a relationship with a primary care physician if you do not have one) for your aftercare needs so that they can reassess your need for medications and monitor your lab values.

## 2023-07-10 NOTE — TOC CM/SW Note (Signed)
 Transition of Care [facility]) - Inpatient Brief Assessment   Patient Details  Name: Tiffany Talarico MRN: [id number] Date of Birth: [birth year]  Transition of Care [facility]) CM/SW Contact:    Leone Haven, RN Phone Number: 07/10/2023, 3:09 PM   Clinical Narrative: From home with spouse, has PCP and insurance on file, states has no HH services in place at this time or DME at home.  States family member will transport them home at [employer] and family is support system, [facility] [address]  Pta self ambulatory.    Transition of Care Asessment: Insurance and Status: Insurance coverage has been reviewed Patient has primary care physician: Yes Home environment has been reviewed: home with wife Prior level of function:: indep Prior/Current Home Services: No current home services Social Drivers of Health Review: SDOH reviewed no interventions necessary Readmission risk has been reviewed: Yes Transition of care needs: no transition of care needs at this time

## 2023-07-10 NOTE — TOC Transition Note (Signed)
 Transition of Care [facility]) - Discharge Note   Patient Details  Name: Victor [name] MRN: [id number] Date of Birth: Jan 25, 1961  Transition of Care [facility]) CM/SW Contact:  Leone Haven, RN Phone Number: 07/10/2023, 3:10 PM   Clinical Narrative:    For dc today, wife will transport home. No needs.         Patient Goals and CMS Choice            Discharge Placement                       Discharge Plan and Services Additional resources added to the After Visit Summary for                                       Social Drivers of Health (SDOH) Interventions SDOH Screenings   Food Insecurity: No Food Insecurity (07/09/2023)  Housing: Low Risk  (07/09/2023)  Transportation Needs: No Transportation Needs (07/09/2023)  Utilities: Not At Risk (07/09/2023)  Depression (PHQ2-9): Low Risk  (05/27/2023)  Financial Resource Strain: Low Risk  (06/15/2021)   Received from [facility]  Tobacco Use: Low Risk  (07/08/2023)     Readmission Risk Interventions    07/10/2023    3:08 PM  Readmission Risk Prevention Plan  Transportation Screening Complete  PCP or Specialist Appt within 5-7 Days Complete  Home Care Screening Complete  Medication Review (RN CM) Complete

## 2023-07-22 ENCOUNTER — Encounter: Payer: Medicare HMO | Attending: Registered Nurse | Admitting: Registered Nurse

## 2023-07-22 ENCOUNTER — Encounter: Payer: Self-pay | Admitting: Registered Nurse

## 2023-07-22 VITALS — BP 128/88 | HR 84 | Ht 73.0 in | Wt 275.0 lb

## 2023-07-22 DIAGNOSIS — G894 Chronic pain syndrome: Secondary | ICD-10-CM | POA: Diagnosis not present

## 2023-07-22 DIAGNOSIS — Z5181 Encounter for therapeutic drug level monitoring: Secondary | ICD-10-CM | POA: Diagnosis present

## 2023-07-22 DIAGNOSIS — F418 Other specified anxiety disorders: Secondary | ICD-10-CM | POA: Insufficient documentation

## 2023-07-22 DIAGNOSIS — Z79891 Long term (current) use of opiate analgesic: Secondary | ICD-10-CM | POA: Diagnosis not present

## 2023-07-22 MED ORDER — OXYCODONE HCL 15 MG PO TABS: 15.0000 mg | ORAL_TABLET | Freq: Three times a day (TID) | ORAL | 0 refills | Status: DC | PRN

## 2023-07-22 MED ORDER — PREGABALIN 150 MG PO CAPS: 150.0000 mg | ORAL_CAPSULE | Freq: Three times a day (TID) | ORAL | 3 refills | Status: DC

## 2023-07-22 MED ORDER — ALPRAZOLAM 0.5 MG PO TABS: 0.5000 mg | ORAL_TABLET | Freq: Two times a day (BID) | ORAL | 1 refills | Status: DC | PRN
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
## 2023-07-25 ENCOUNTER — Telehealth: Payer: Self-pay | Admitting: Registered Nurse

## 2023-07-25 LAB — TOXASSURE SELECT,+ANTIDEPR,UR

## 2023-07-25 NOTE — Telephone Encounter (Signed)
 UDS was Reviewed.  +ETOH Call placed to Victor [name], he reports he had a glass of wine on Sunday.  Narcotic Contract was reviewed, letter will be sent, he verbalizes understanding. He is aware I this occurs again, can lead him to be discharged from our office., He verbalizes understanding.

## 2023-08-28 ENCOUNTER — Other Ambulatory Visit (HOSPITAL_COMMUNITY): Payer: Self-pay

## 2023-09-06 ENCOUNTER — Other Ambulatory Visit (HOSPITAL_COMMUNITY): Payer: Self-pay

## 2023-09-12 ENCOUNTER — Other Ambulatory Visit (HOSPITAL_COMMUNITY): Payer: Self-pay
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
## 2023-09-17 ENCOUNTER — Encounter: Payer: Self-pay | Admitting: Registered Nurse

## 2023-09-17 ENCOUNTER — Encounter: Attending: Registered Nurse | Admitting: Registered Nurse

## 2023-09-17 VITALS — BP 136/97 | HR 87 | Ht 73.0 in | Wt 282.4 lb

## 2023-09-17 DIAGNOSIS — Z5181 Encounter for therapeutic drug level monitoring: Secondary | ICD-10-CM | POA: Insufficient documentation

## 2023-09-17 DIAGNOSIS — G894 Chronic pain syndrome: Secondary | ICD-10-CM | POA: Diagnosis present

## 2023-09-17 DIAGNOSIS — S8412XS Injury of peroneal nerve at lower leg level, left leg, sequela: Secondary | ICD-10-CM | POA: Insufficient documentation

## 2023-09-17 DIAGNOSIS — Z79891 Long term (current) use of opiate analgesic: Secondary | ICD-10-CM | POA: Diagnosis present

## 2023-09-17 DIAGNOSIS — F418 Other specified anxiety disorders: Secondary | ICD-10-CM | POA: Diagnosis present

## 2023-09-17 DIAGNOSIS — M25562 Pain in left knee: Secondary | ICD-10-CM | POA: Insufficient documentation

## 2023-09-17 DIAGNOSIS — G8929 Other chronic pain: Secondary | ICD-10-CM | POA: Insufficient documentation

## 2023-09-17 DIAGNOSIS — M25572 Pain in left ankle and joints of left foot: Secondary | ICD-10-CM | POA: Diagnosis present

## 2023-09-17 MED ORDER — OXYCODONE HCL 15 MG PO TABS: 15.0000 mg | ORAL_TABLET | Freq: Three times a day (TID) | ORAL | 0 refills | Status: DC | PRN

## 2023-09-23 ENCOUNTER — Other Ambulatory Visit: Payer: Self-pay | Admitting: Physician Assistant

## 2023-09-23 ENCOUNTER — Ambulatory Visit
Admission: RE | Admit: 2023-09-23 | Discharge: 2023-09-23 | Disposition: A | Discharge status: Home / Self Care | Source: Ambulatory Visit | Attending: Physician Assistant | Admitting: Physician Assistant

## 2023-09-23 DIAGNOSIS — W19XXXA Unspecified fall, initial encounter: Secondary | ICD-10-CM

## 2023-09-23 DIAGNOSIS — S6992XA Unspecified injury of left wrist, hand and finger(s), initial encounter: Secondary | ICD-10-CM

## 2023-09-27 NOTE — Telephone Encounter (Signed)
 Formal warning letter sent through MyChart.

## 2023-11-14 ENCOUNTER — Encounter: Payer: Self-pay | Admitting: Registered Nurse

## 2023-11-14 ENCOUNTER — Encounter: Attending: Registered Nurse | Admitting: Registered Nurse

## 2023-11-14 VITALS — BP 135/96 | HR 89 | Ht 73.0 in | Wt 275.0 lb

## 2023-11-14 DIAGNOSIS — S8412XS Injury of peroneal nerve at lower leg level, left leg, sequela: Secondary | ICD-10-CM | POA: Diagnosis not present

## 2023-11-14 DIAGNOSIS — Z79891 Long term (current) use of opiate analgesic: Secondary | ICD-10-CM | POA: Diagnosis not present

## 2023-11-14 DIAGNOSIS — G8929 Other chronic pain: Secondary | ICD-10-CM

## 2023-11-14 DIAGNOSIS — M546 Pain in thoracic spine: Secondary | ICD-10-CM | POA: Diagnosis not present

## 2023-11-14 DIAGNOSIS — Z5181 Encounter for therapeutic drug level monitoring: Secondary | ICD-10-CM | POA: Diagnosis present

## 2023-11-14 DIAGNOSIS — W19XXXD Unspecified fall, subsequent encounter: Secondary | ICD-10-CM | POA: Diagnosis not present

## 2023-11-14 DIAGNOSIS — M25562 Pain in left knee: Secondary | ICD-10-CM | POA: Diagnosis not present

## 2023-11-14 DIAGNOSIS — F418 Other specified anxiety disorders: Secondary | ICD-10-CM | POA: Diagnosis not present

## 2023-11-14 DIAGNOSIS — G894 Chronic pain syndrome: Secondary | ICD-10-CM | POA: Insufficient documentation

## 2023-11-14 DIAGNOSIS — M545 Low back pain, unspecified: Secondary | ICD-10-CM | POA: Insufficient documentation

## 2023-11-14 DIAGNOSIS — Y92009 Unspecified place in unspecified non-institutional (private) residence as the place of occurrence of the external cause: Secondary | ICD-10-CM | POA: Insufficient documentation

## 2023-11-14 DIAGNOSIS — M25572 Pain in left ankle and joints of left foot: Secondary | ICD-10-CM | POA: Diagnosis not present

## 2023-11-14 MED ORDER — OXYCODONE HCL 15 MG PO TABS: 15.0000 mg | ORAL_TABLET | Freq: Three times a day (TID) | ORAL | 0 refills | Status: DC | PRN

## 2023-11-14 MED ORDER — ALPRAZOLAM 0.5 MG PO TABS
0.5000 mg | ORAL_TABLET | Freq: Two times a day (BID) | ORAL | 1 refills | Status: DC | PRN
Start: 2023-11-14 — End: 2024-01-10

## 2023-11-14 MED ORDER — OXYCODONE HCL 15 MG PO TABS
15.0000 mg | ORAL_TABLET | Freq: Three times a day (TID) | ORAL | 0 refills | Status: DC | PRN
Start: 2023-11-14 — End: 2024-01-10
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
## 2023-11-19 LAB — TOXASSURE SELECT,+ANTIDEPR,UR

## 2023-12-02 ENCOUNTER — Ambulatory Visit
Admission: RE | Admit: 2023-12-02 | Discharge: 2023-12-02 | Disposition: A | Discharge status: Home / Self Care | Source: Ambulatory Visit | Attending: Registered Nurse

## 2023-12-17 ENCOUNTER — Telehealth: Payer: Self-pay | Admitting: Registered Nurse

## 2023-12-17 MED ORDER — METHOCARBAMOL 500 MG PO TABS: 500.0000 mg | ORAL_TABLET | Freq: Two times a day (BID) | ORAL | 1 refills | Status: DC | PRN

## 2023-12-17 NOTE — Telephone Encounter (Signed)
 Return  Mr. Moist call,  X-rays reviewed.  Methocarbamol  Prescribed  He is currently on daily prednisone , via Transplant Team.  He will call office in two weeks with update, he verbalizes understanding.

## 2024-01-10 ENCOUNTER — Encounter: Payer: Self-pay | Admitting: Registered Nurse

## 2024-01-10 ENCOUNTER — Encounter: Attending: Registered Nurse | Admitting: Registered Nurse

## 2024-01-10 VITALS — BP 133/95 | HR 89 | Ht 73.0 in | Wt 275.0 lb

## 2024-01-10 DIAGNOSIS — F418 Other specified anxiety disorders: Secondary | ICD-10-CM | POA: Insufficient documentation

## 2024-01-10 DIAGNOSIS — Z79891 Long term (current) use of opiate analgesic: Secondary | ICD-10-CM | POA: Insufficient documentation

## 2024-01-10 DIAGNOSIS — M545 Low back pain, unspecified: Secondary | ICD-10-CM | POA: Insufficient documentation

## 2024-01-10 DIAGNOSIS — M25572 Pain in left ankle and joints of left foot: Secondary | ICD-10-CM | POA: Diagnosis not present

## 2024-01-10 DIAGNOSIS — M25562 Pain in left knee: Secondary | ICD-10-CM | POA: Insufficient documentation

## 2024-01-10 DIAGNOSIS — Z5181 Encounter for therapeutic drug level monitoring: Secondary | ICD-10-CM | POA: Diagnosis present

## 2024-01-10 DIAGNOSIS — G8929 Other chronic pain: Secondary | ICD-10-CM | POA: Diagnosis present

## 2024-01-10 DIAGNOSIS — G894 Chronic pain syndrome: Secondary | ICD-10-CM | POA: Diagnosis present

## 2024-01-10 DIAGNOSIS — S8412XS Injury of peroneal nerve at lower leg level, left leg, sequela: Secondary | ICD-10-CM | POA: Insufficient documentation

## 2024-01-10 MED ORDER — ALPRAZOLAM 0.5 MG PO TABS: 0.5000 mg | ORAL_TABLET | Freq: Two times a day (BID) | ORAL | 1 refills | Status: DC | PRN

## 2024-01-10 MED ORDER — PREGABALIN 150 MG PO CAPS: 150.0000 mg | ORAL_CAPSULE | Freq: Three times a day (TID) | ORAL | 3 refills | Status: DC

## 2024-01-10 MED ORDER — OXYCODONE HCL 15 MG PO TABS: 15.0000 mg | ORAL_TABLET | Freq: Four times a day (QID) | ORAL | 0 refills | Status: DC | PRN
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
## 2024-01-13 ENCOUNTER — Ambulatory Visit (INDEPENDENT_AMBULATORY_CARE_PROVIDER_SITE_OTHER)

## 2024-01-13 ENCOUNTER — Encounter: Payer: Self-pay | Admitting: Podiatry

## 2024-01-13 ENCOUNTER — Ambulatory Visit (INDEPENDENT_AMBULATORY_CARE_PROVIDER_SITE_OTHER): Admitting: Podiatry

## 2024-01-13 DIAGNOSIS — M7751 Other enthesopathy of right foot: Secondary | ICD-10-CM

## 2024-01-13 DIAGNOSIS — M109 Gout, unspecified: Secondary | ICD-10-CM

## 2024-01-13 MED ORDER — TRIAMCINOLONE ACETONIDE 10 MG/ML IJ SUSP
10.0000 mg | Freq: Once | INTRAMUSCULAR | Status: AC
  Administered 2024-01-13: 10 mg via INTRA_ARTICULAR

## 2024-01-15 NOTE — Progress Notes (Signed)
 Subjective:   Patient ID: Victor [name], adult   DOB: 63 y.o.   MRN: [id number]   HPI Patient presents stating that joint has started to become bothersome again and he does not remember specific injury   ROS      Objective:  Physical Exam  Neurovascular status intact with increased inflammation around the first MPJ right and he feels like more prominence of the bone structure.  Patient is found to have no other changes health history and did eat a lot of shellfish prior to this     Assessment:  Probability for gout attack first MPJ right with also possibility for structural bunion deformity and inflammatory capsulitis     Plan:  H&P conditions reviewed and discussed.  At this point I have recommended that wider shoe gear I discussed gout and we discussed medications that he is on.  He is going to have to be more careful with diet and we may decide to start a more aggressive protocol for him but at this point we will maintain and I went ahead and I did do a sterile prep and injected periarticular around the first MPJ 3 mg Kenalog  5 mg Xylocaine .  Patient to be seen back  X-rays indicate moderate structural deformity right not a significant increase from previous visit

## 2024-02-11 ENCOUNTER — Other Ambulatory Visit: Payer: Self-pay

## 2024-02-11 ENCOUNTER — Telehealth: Payer: Self-pay | Admitting: Registered Nurse

## 2024-02-11 DIAGNOSIS — G8929 Other chronic pain: Secondary | ICD-10-CM

## 2024-02-11 DIAGNOSIS — G894 Chronic pain syndrome: Secondary | ICD-10-CM

## 2024-02-11 MED ORDER — OXYCODONE HCL 15 MG PO TABS: 15.0000 mg | ORAL_TABLET | Freq: Four times a day (QID) | ORAL | 0 refills | Status: DC | PRN

## 2024-02-11 NOTE — Telephone Encounter (Signed)
 See prior note

## 2024-02-11 NOTE — Telephone Encounter (Signed)
PMP was Reviewed.  Oxycodone e-scribed to pharmacy.

## 2024-02-11 NOTE — Telephone Encounter (Signed)
 Oxycodone  refill request.  Filled  Written  ID  Drug  QTY  Days  Prescriber  RX #  Dispenser  Refill  Daily Dose*  Pymt Type  PMP  01/29/2024 01/10/2024 1  Alprazolam  0.5 Mg Tablet 60.00 30 Eu Tho [id number] Nor (4142) 0/1 2.00 LME Medicare [place] 01/27/2024 01/10/2024 1  Pregabalin  150 Mg Capsule 90.00 30 Eu Tho [id number] Nor (4142) 0/3 3.01 LME Medicare [place] 01/14/2024 01/10/2024 1  Oxycodone  Hcl (Ir) 15 Mg Tab 110.00 30 Eu Tho [id number] Nor (4142) 0/0 82.50 MME Medicare [place]

## 2024-02-16 ENCOUNTER — Other Ambulatory Visit: Payer: Self-pay | Admitting: Registered Nurse

## 2024-02-19 ENCOUNTER — Telehealth: Payer: Self-pay | Admitting: Registered Nurse

## 2024-02-19 NOTE — Telephone Encounter (Signed)
 Call placed to Victor [name],  He reports in the past he has used Methocarbamol  for his muscle spasms. Prescription sent to pharmacy, he verbalizes understanding.

## 2024-03-13 ENCOUNTER — Encounter: Attending: Registered Nurse | Admitting: Registered Nurse

## 2024-03-13 VITALS — BP 137/95 | HR 89 | Ht 73.0 in | Wt 281.0 lb

## 2024-03-13 DIAGNOSIS — M25562 Pain in left knee: Secondary | ICD-10-CM | POA: Diagnosis present

## 2024-03-13 DIAGNOSIS — G894 Chronic pain syndrome: Secondary | ICD-10-CM | POA: Diagnosis present

## 2024-03-13 DIAGNOSIS — M545 Low back pain, unspecified: Secondary | ICD-10-CM | POA: Insufficient documentation

## 2024-03-13 DIAGNOSIS — Z79891 Long term (current) use of opiate analgesic: Secondary | ICD-10-CM | POA: Insufficient documentation

## 2024-03-13 DIAGNOSIS — Z5181 Encounter for therapeutic drug level monitoring: Secondary | ICD-10-CM | POA: Diagnosis present

## 2024-03-13 DIAGNOSIS — M25572 Pain in left ankle and joints of left foot: Secondary | ICD-10-CM | POA: Diagnosis present

## 2024-03-13 DIAGNOSIS — S8412XS Injury of peroneal nerve at lower leg level, left leg, sequela: Secondary | ICD-10-CM | POA: Insufficient documentation

## 2024-03-13 DIAGNOSIS — G8929 Other chronic pain: Secondary | ICD-10-CM | POA: Insufficient documentation

## 2024-03-13 DIAGNOSIS — F418 Other specified anxiety disorders: Secondary | ICD-10-CM | POA: Diagnosis present

## 2024-03-13 MED ORDER — OXYCODONE HCL 15 MG PO TABS: 15.0000 mg | ORAL_TABLET | Freq: Four times a day (QID) | ORAL | 0 refills | Status: DC | PRN
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
## 2024-03-17 LAB — DRUG TOX MONITOR 1 W/CONF, ORAL FLD
Amphetamines: NEGATIVE ng/mL (ref <10)
Barbiturates: NEGATIVE ng/mL (ref <10)
Benzodiazepines: NEGATIVE ng/mL (ref <0.50)
Buprenorphine: NEGATIVE ng/mL (ref <0.10)
Cocaine: NEGATIVE ng/mL (ref <5.0)
Codeine: NEGATIVE ng/mL (ref <2.5)
Dihydrocodeine: NEGATIVE ng/mL (ref <2.5)
Fentanyl: NEGATIVE ng/mL (ref <0.10)
Heroin Metabolite: NEGATIVE ng/mL (ref <1.0)
Hydrocodone: NEGATIVE ng/mL (ref <2.5)
Hydromorphone: NEGATIVE ng/mL (ref <2.5)
MARIJUANA: NEGATIVE ng/mL (ref <2.5)
MDMA: NEGATIVE ng/mL (ref <10)
Meprobamate: NEGATIVE ng/mL (ref <2.5)
Methadone: NEGATIVE ng/mL (ref <5.0)
Morphine: NEGATIVE ng/mL (ref <2.5)
Nicotine Metabolite: NEGATIVE ng/mL (ref <5.0)
Norhydrocodone: NEGATIVE ng/mL (ref <2.5)
Noroxycodone: NEGATIVE ng/mL (ref <2.5)
Opiates: POSITIVE ng/mL — AB (ref <2.5)
Oxycodone: 14.7 ng/mL — ABNORMAL HIGH (ref <2.5)
Oxymorphone: NEGATIVE ng/mL (ref <2.5)
Phencyclidine: NEGATIVE ng/mL (ref <10)
Tapentadol: NEGATIVE ng/mL (ref <5.0)
Tramadol: NEGATIVE ng/mL (ref <5.0)
Zolpidem: NEGATIVE ng/mL (ref <5.0)

## 2024-03-17 LAB — DRUG TOX ALC METAB W/CON, ORAL FLD: Alcohol Metabolite: NEGATIVE ng/mL (ref <25)

## 2024-04-10 ENCOUNTER — Other Ambulatory Visit: Payer: Self-pay | Admitting: Registered Nurse

## 2024-04-10 DIAGNOSIS — G894 Chronic pain syndrome: Secondary | ICD-10-CM

## 2024-04-10 DIAGNOSIS — F418 Other specified anxiety disorders: Secondary | ICD-10-CM

## 2024-04-10 DIAGNOSIS — G8929 Other chronic pain: Secondary | ICD-10-CM

## 2024-04-10 MED ORDER — OXYCODONE HCL 15 MG PO TABS: 15.0000 mg | ORAL_TABLET | Freq: Four times a day (QID) | ORAL | 0 refills | Status: DC | PRN

## 2024-04-11 ENCOUNTER — Other Ambulatory Visit: Payer: Self-pay | Admitting: Registered Nurse

## 2024-05-11 ENCOUNTER — Encounter: Payer: Self-pay | Admitting: Registered Nurse

## 2024-05-11 ENCOUNTER — Encounter: Attending: Registered Nurse | Admitting: Registered Nurse

## 2024-05-11 VITALS — BP 159/119 | HR 90 | Ht 73.0 in | Wt 276.0 lb

## 2024-05-11 DIAGNOSIS — I10 Essential (primary) hypertension: Secondary | ICD-10-CM | POA: Diagnosis not present

## 2024-05-11 DIAGNOSIS — M25572 Pain in left ankle and joints of left foot: Secondary | ICD-10-CM | POA: Diagnosis not present

## 2024-05-11 DIAGNOSIS — Z5181 Encounter for therapeutic drug level monitoring: Secondary | ICD-10-CM | POA: Insufficient documentation

## 2024-05-11 DIAGNOSIS — G8929 Other chronic pain: Secondary | ICD-10-CM | POA: Insufficient documentation

## 2024-05-11 DIAGNOSIS — S8412XS Injury of peroneal nerve at lower leg level, left leg, sequela: Secondary | ICD-10-CM | POA: Diagnosis not present

## 2024-05-11 DIAGNOSIS — Z79891 Long term (current) use of opiate analgesic: Secondary | ICD-10-CM | POA: Diagnosis not present

## 2024-05-11 DIAGNOSIS — M25562 Pain in left knee: Secondary | ICD-10-CM | POA: Insufficient documentation

## 2024-05-11 DIAGNOSIS — F418 Other specified anxiety disorders: Secondary | ICD-10-CM | POA: Diagnosis not present

## 2024-05-11 DIAGNOSIS — M545 Low back pain, unspecified: Secondary | ICD-10-CM | POA: Diagnosis not present

## 2024-05-11 DIAGNOSIS — G894 Chronic pain syndrome: Secondary | ICD-10-CM | POA: Insufficient documentation

## 2024-05-11 MED ORDER — PREGABALIN 150 MG PO CAPS: 150.0000 mg | ORAL_CAPSULE | Freq: Three times a day (TID) | ORAL | 3 refills | Status: AC

## 2024-05-11 MED ORDER — ALPRAZOLAM 0.5 MG PO TABS: 0.5000 mg | ORAL_TABLET | Freq: Two times a day (BID) | ORAL | 1 refills | Status: AC | PRN

## 2024-05-11 MED ORDER — OXYCODONE HCL 15 MG PO TABS: 15.0000 mg | ORAL_TABLET | Freq: Four times a day (QID) | ORAL | 0 refills | Status: DC | PRN

## 2024-05-11 MED ORDER — OXYCODONE HCL 15 MG PO TABS: 15.0000 mg | ORAL_TABLET | Freq: Four times a day (QID) | ORAL | 0 refills | Status: AC | PRN
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
## 2024-06-06 ENCOUNTER — Other Ambulatory Visit: Payer: Self-pay | Admitting: Registered Nurse

## 2024-07-06 ENCOUNTER — Encounter: Attending: Registered Nurse | Admitting: Registered Nurse
